# Patient Record
Sex: Male | Born: 1955 | Race: White | Hispanic: No | Smoking: Former smoker
Health system: Southern US, Community
[De-identification: ages and names within clinical notes are randomized; demographics above are authoritative.]

## PROBLEM LIST (undated history)

## (undated) DIAGNOSIS — F1010 Alcohol abuse, uncomplicated: Secondary | ICD-10-CM

## (undated) DIAGNOSIS — F410 Panic disorder [episodic paroxysmal anxiety] without agoraphobia: Secondary | ICD-10-CM

## (undated) DIAGNOSIS — K5790 Diverticulosis of intestine, part unspecified, without perforation or abscess without bleeding: Secondary | ICD-10-CM

## (undated) DIAGNOSIS — E785 Hyperlipidemia, unspecified: Secondary | ICD-10-CM

## (undated) DIAGNOSIS — G8929 Other chronic pain: Secondary | ICD-10-CM

## (undated) DIAGNOSIS — G4730 Sleep apnea, unspecified: Secondary | ICD-10-CM

## (undated) DIAGNOSIS — R0600 Dyspnea, unspecified: Secondary | ICD-10-CM

## (undated) DIAGNOSIS — K859 Acute pancreatitis without necrosis or infection, unspecified: Secondary | ICD-10-CM

## (undated) DIAGNOSIS — B1920 Unspecified viral hepatitis C without hepatic coma: Secondary | ICD-10-CM

## (undated) DIAGNOSIS — T7840XA Allergy, unspecified, initial encounter: Secondary | ICD-10-CM

## (undated) DIAGNOSIS — F319 Bipolar disorder, unspecified: Secondary | ICD-10-CM

## (undated) DIAGNOSIS — F32A Depression, unspecified: Secondary | ICD-10-CM

## (undated) DIAGNOSIS — F1991 Other psychoactive substance use, unspecified, in remission: Secondary | ICD-10-CM

## (undated) DIAGNOSIS — F329 Major depressive disorder, single episode, unspecified: Secondary | ICD-10-CM

## (undated) DIAGNOSIS — I10 Essential (primary) hypertension: Secondary | ICD-10-CM

## (undated) DIAGNOSIS — Z87898 Personal history of other specified conditions: Secondary | ICD-10-CM

## (undated) HISTORY — DX: Other chronic pain: G89.29

## (undated) HISTORY — DX: Alcohol abuse, uncomplicated: F10.10

## (undated) HISTORY — DX: Allergy, unspecified, initial encounter: T78.40XA

## (undated) HISTORY — DX: Unspecified viral hepatitis C without hepatic coma: B19.20

## (undated) HISTORY — DX: Acute pancreatitis without necrosis or infection, unspecified: K85.90

## (undated) HISTORY — PX: OTHER SURGICAL HISTORY: SHX169

## (undated) HISTORY — DX: Bipolar disorder, unspecified: F31.9

## (undated) HISTORY — DX: Diverticulosis of intestine, part unspecified, without perforation or abscess without bleeding: K57.90

## (undated) HISTORY — DX: Dyspnea, unspecified: R06.00

## (undated) HISTORY — DX: Major depressive disorder, single episode, unspecified: F32.9

## (undated) HISTORY — DX: Essential (primary) hypertension: I10

## (undated) HISTORY — DX: Depression, unspecified: F32.A

## (undated) HISTORY — PX: TONSILLECTOMY: SUR1361

## (undated) HISTORY — DX: Hyperlipidemia, unspecified: E78.5

---

## 1999-09-26 ENCOUNTER — Ambulatory Visit (HOSPITAL_COMMUNITY): Admission: RE | Admit: 1999-09-26 | Discharge: 1999-09-26 | Payer: Self-pay | Admitting: Internal Medicine

## 1999-09-26 ENCOUNTER — Encounter: Payer: Self-pay | Admitting: Internal Medicine

## 2000-09-28 ENCOUNTER — Emergency Department (HOSPITAL_COMMUNITY): Admission: EM | Admit: 2000-09-28 | Discharge: 2000-09-28 | Payer: Self-pay | Admitting: Emergency Medicine

## 2001-01-07 ENCOUNTER — Encounter: Admission: RE | Admit: 2001-01-07 | Discharge: 2001-02-13 | Payer: Self-pay | Admitting: Family Medicine

## 2001-03-27 ENCOUNTER — Encounter: Admission: RE | Admit: 2001-03-27 | Discharge: 2001-06-25 | Payer: Self-pay

## 2001-04-08 HISTORY — PX: CARDIAC CATHETERIZATION: SHX172

## 2001-07-16 ENCOUNTER — Encounter: Admission: RE | Admit: 2001-07-16 | Discharge: 2001-10-14 | Payer: Self-pay

## 2001-10-15 ENCOUNTER — Encounter: Admission: RE | Admit: 2001-10-15 | Discharge: 2002-01-05 | Payer: Self-pay

## 2002-01-11 ENCOUNTER — Encounter: Payer: Self-pay | Admitting: Cardiology

## 2002-01-11 ENCOUNTER — Inpatient Hospital Stay (HOSPITAL_COMMUNITY): Admission: EM | Admit: 2002-01-11 | Discharge: 2002-01-12 | Payer: Self-pay | Admitting: *Deleted

## 2002-01-12 ENCOUNTER — Ambulatory Visit (HOSPITAL_COMMUNITY): Admission: EM | Admit: 2002-01-12 | Discharge: 2002-01-12 | Payer: Self-pay | Admitting: Cardiology

## 2002-01-25 ENCOUNTER — Encounter: Admission: RE | Admit: 2002-01-25 | Discharge: 2002-04-25 | Payer: Self-pay

## 2002-02-01 ENCOUNTER — Ambulatory Visit (HOSPITAL_COMMUNITY): Admission: RE | Admit: 2002-02-01 | Discharge: 2002-02-01 | Payer: Self-pay

## 2002-05-05 ENCOUNTER — Encounter: Admission: RE | Admit: 2002-05-05 | Discharge: 2002-08-03 | Payer: Self-pay

## 2002-05-07 ENCOUNTER — Ambulatory Visit (HOSPITAL_COMMUNITY): Admission: RE | Admit: 2002-05-07 | Discharge: 2002-05-07 | Payer: Self-pay

## 2002-08-05 ENCOUNTER — Encounter
Admission: RE | Admit: 2002-08-05 | Discharge: 2002-11-03 | Payer: Self-pay | Admitting: Physical Medicine & Rehabilitation

## 2002-11-15 ENCOUNTER — Encounter
Admission: RE | Admit: 2002-11-15 | Discharge: 2003-02-13 | Payer: Self-pay | Admitting: Physical Medicine & Rehabilitation

## 2003-02-17 ENCOUNTER — Encounter
Admission: RE | Admit: 2003-02-17 | Discharge: 2003-05-18 | Payer: Self-pay | Admitting: Physical Medicine & Rehabilitation

## 2003-05-19 ENCOUNTER — Emergency Department (HOSPITAL_COMMUNITY): Admission: EM | Admit: 2003-05-19 | Discharge: 2003-05-19 | Payer: Self-pay | Admitting: Emergency Medicine

## 2003-07-21 ENCOUNTER — Encounter
Admission: RE | Admit: 2003-07-21 | Discharge: 2003-10-19 | Payer: Self-pay | Admitting: Physical Medicine & Rehabilitation

## 2003-09-30 ENCOUNTER — Encounter: Admission: RE | Admit: 2003-09-30 | Discharge: 2003-09-30 | Payer: Self-pay | Admitting: Family Medicine

## 2003-10-13 ENCOUNTER — Ambulatory Visit (HOSPITAL_BASED_OUTPATIENT_CLINIC_OR_DEPARTMENT_OTHER): Admission: RE | Admit: 2003-10-13 | Discharge: 2003-10-13 | Payer: Self-pay | Admitting: Family Medicine

## 2003-10-13 ENCOUNTER — Encounter: Payer: Self-pay | Admitting: Pulmonary Disease

## 2003-10-20 ENCOUNTER — Encounter
Admission: RE | Admit: 2003-10-20 | Discharge: 2004-01-05 | Payer: Self-pay | Admitting: Physical Medicine & Rehabilitation

## 2003-12-09 ENCOUNTER — Ambulatory Visit: Payer: Self-pay | Admitting: Family Medicine

## 2003-12-28 ENCOUNTER — Ambulatory Visit (HOSPITAL_BASED_OUTPATIENT_CLINIC_OR_DEPARTMENT_OTHER): Admission: RE | Admit: 2003-12-28 | Discharge: 2003-12-28 | Payer: Self-pay | Admitting: Sports Medicine

## 2003-12-28 ENCOUNTER — Encounter: Payer: Self-pay | Admitting: Internal Medicine

## 2004-01-05 ENCOUNTER — Encounter
Admission: RE | Admit: 2004-01-05 | Discharge: 2004-04-04 | Payer: Self-pay | Admitting: Physical Medicine & Rehabilitation

## 2004-01-09 ENCOUNTER — Ambulatory Visit: Payer: Self-pay | Admitting: Physical Medicine & Rehabilitation

## 2004-01-26 ENCOUNTER — Ambulatory Visit: Payer: Self-pay | Admitting: Family Medicine

## 2004-02-27 ENCOUNTER — Ambulatory Visit: Payer: Self-pay | Admitting: Sports Medicine

## 2004-03-09 ENCOUNTER — Ambulatory Visit: Payer: Self-pay | Admitting: Physical Medicine & Rehabilitation

## 2004-04-27 ENCOUNTER — Ambulatory Visit: Payer: Self-pay | Admitting: Sports Medicine

## 2004-05-17 ENCOUNTER — Encounter
Admission: RE | Admit: 2004-05-17 | Discharge: 2004-08-15 | Payer: Self-pay | Admitting: Physical Medicine & Rehabilitation

## 2004-05-17 ENCOUNTER — Ambulatory Visit: Payer: Self-pay | Admitting: Physical Medicine & Rehabilitation

## 2004-06-15 ENCOUNTER — Ambulatory Visit: Payer: Self-pay | Admitting: Family Medicine

## 2004-06-21 ENCOUNTER — Ambulatory Visit: Payer: Self-pay | Admitting: Family Medicine

## 2004-08-02 ENCOUNTER — Ambulatory Visit: Payer: Self-pay | Admitting: Physical Medicine & Rehabilitation

## 2004-08-29 ENCOUNTER — Encounter
Admission: RE | Admit: 2004-08-29 | Discharge: 2004-11-27 | Payer: Self-pay | Admitting: Physical Medicine & Rehabilitation

## 2004-11-23 ENCOUNTER — Ambulatory Visit: Payer: Self-pay | Admitting: Physical Medicine & Rehabilitation

## 2004-12-25 ENCOUNTER — Encounter
Admission: RE | Admit: 2004-12-25 | Discharge: 2005-03-25 | Payer: Self-pay | Admitting: Physical Medicine & Rehabilitation

## 2004-12-25 ENCOUNTER — Ambulatory Visit: Payer: Self-pay | Admitting: Physical Medicine & Rehabilitation

## 2004-12-28 ENCOUNTER — Ambulatory Visit: Payer: Self-pay | Admitting: Family Medicine

## 2005-01-04 ENCOUNTER — Ambulatory Visit: Payer: Self-pay | Admitting: Family Medicine

## 2005-01-08 ENCOUNTER — Emergency Department (HOSPITAL_COMMUNITY): Admission: EM | Admit: 2005-01-08 | Discharge: 2005-01-08 | Payer: Self-pay | Admitting: Emergency Medicine

## 2005-02-08 ENCOUNTER — Ambulatory Visit: Payer: Self-pay | Admitting: Family Medicine

## 2005-02-22 ENCOUNTER — Ambulatory Visit: Payer: Self-pay | Admitting: Physical Medicine & Rehabilitation

## 2005-03-29 ENCOUNTER — Ambulatory Visit: Payer: Self-pay | Admitting: Family Medicine

## 2005-04-19 ENCOUNTER — Encounter
Admission: RE | Admit: 2005-04-19 | Discharge: 2005-07-18 | Payer: Self-pay | Admitting: Physical Medicine & Rehabilitation

## 2005-04-19 ENCOUNTER — Ambulatory Visit: Payer: Self-pay | Admitting: Physical Medicine & Rehabilitation

## 2005-06-12 ENCOUNTER — Ambulatory Visit: Payer: Self-pay | Admitting: Physical Medicine & Rehabilitation

## 2005-08-08 ENCOUNTER — Ambulatory Visit: Payer: Self-pay | Admitting: Physical Medicine & Rehabilitation

## 2005-08-08 ENCOUNTER — Encounter
Admission: RE | Admit: 2005-08-08 | Discharge: 2005-11-06 | Payer: Self-pay | Admitting: Physical Medicine & Rehabilitation

## 2005-09-30 ENCOUNTER — Ambulatory Visit: Payer: Self-pay | Admitting: Physical Medicine & Rehabilitation

## 2005-12-03 ENCOUNTER — Ambulatory Visit: Payer: Self-pay | Admitting: Physical Medicine & Rehabilitation

## 2005-12-03 ENCOUNTER — Encounter
Admission: RE | Admit: 2005-12-03 | Discharge: 2006-03-03 | Payer: Self-pay | Admitting: Physical Medicine & Rehabilitation

## 2005-12-17 ENCOUNTER — Encounter
Admission: RE | Admit: 2005-12-17 | Discharge: 2005-12-17 | Payer: Self-pay | Admitting: Physical Medicine & Rehabilitation

## 2005-12-30 ENCOUNTER — Ambulatory Visit: Payer: Self-pay | Admitting: Family Medicine

## 2006-01-24 ENCOUNTER — Ambulatory Visit: Payer: Self-pay | Admitting: Physical Medicine & Rehabilitation

## 2006-02-13 ENCOUNTER — Ambulatory Visit: Payer: Self-pay | Admitting: Sports Medicine

## 2006-03-24 ENCOUNTER — Ambulatory Visit: Payer: Self-pay | Admitting: Physical Medicine & Rehabilitation

## 2006-03-24 ENCOUNTER — Encounter
Admission: RE | Admit: 2006-03-24 | Discharge: 2006-06-22 | Payer: Self-pay | Admitting: Physical Medicine & Rehabilitation

## 2006-04-08 DIAGNOSIS — T401X2A Poisoning by heroin, intentional self-harm, initial encounter: Secondary | ICD-10-CM | POA: Insufficient documentation

## 2006-04-18 ENCOUNTER — Encounter
Admission: RE | Admit: 2006-04-18 | Discharge: 2006-07-17 | Payer: Self-pay | Admitting: Physical Medicine & Rehabilitation

## 2006-05-19 ENCOUNTER — Encounter
Admission: RE | Admit: 2006-05-19 | Discharge: 2006-08-17 | Payer: Self-pay | Admitting: Physical Medicine and Rehabilitation

## 2006-05-19 ENCOUNTER — Ambulatory Visit: Payer: Self-pay | Admitting: Physical Medicine and Rehabilitation

## 2006-06-05 DIAGNOSIS — F411 Generalized anxiety disorder: Secondary | ICD-10-CM

## 2006-06-05 DIAGNOSIS — M545 Low back pain, unspecified: Secondary | ICD-10-CM | POA: Insufficient documentation

## 2006-06-05 DIAGNOSIS — G4730 Sleep apnea, unspecified: Secondary | ICD-10-CM | POA: Insufficient documentation

## 2006-06-05 DIAGNOSIS — F339 Major depressive disorder, recurrent, unspecified: Secondary | ICD-10-CM | POA: Insufficient documentation

## 2006-06-05 DIAGNOSIS — F1010 Alcohol abuse, uncomplicated: Secondary | ICD-10-CM | POA: Insufficient documentation

## 2006-06-05 DIAGNOSIS — F319 Bipolar disorder, unspecified: Secondary | ICD-10-CM | POA: Insufficient documentation

## 2006-06-05 DIAGNOSIS — F419 Anxiety disorder, unspecified: Secondary | ICD-10-CM | POA: Insufficient documentation

## 2006-06-05 DIAGNOSIS — B1710 Acute hepatitis C without hepatic coma: Secondary | ICD-10-CM | POA: Insufficient documentation

## 2006-08-26 ENCOUNTER — Encounter (INDEPENDENT_AMBULATORY_CARE_PROVIDER_SITE_OTHER): Payer: Self-pay | Admitting: *Deleted

## 2007-01-26 ENCOUNTER — Telehealth: Payer: Self-pay | Admitting: *Deleted

## 2007-02-02 ENCOUNTER — Ambulatory Visit: Payer: Self-pay | Admitting: Sports Medicine

## 2007-02-12 ENCOUNTER — Telehealth (INDEPENDENT_AMBULATORY_CARE_PROVIDER_SITE_OTHER): Payer: Self-pay | Admitting: Family Medicine

## 2007-04-08 ENCOUNTER — Encounter (INDEPENDENT_AMBULATORY_CARE_PROVIDER_SITE_OTHER): Payer: Self-pay | Admitting: *Deleted

## 2007-05-13 ENCOUNTER — Ambulatory Visit: Payer: Self-pay | Admitting: Family Medicine

## 2007-05-13 ENCOUNTER — Encounter (INDEPENDENT_AMBULATORY_CARE_PROVIDER_SITE_OTHER): Payer: Self-pay | Admitting: *Deleted

## 2007-05-13 DIAGNOSIS — J309 Allergic rhinitis, unspecified: Secondary | ICD-10-CM | POA: Insufficient documentation

## 2007-05-13 LAB — CONVERTED CEMR LAB: PSA: NORMAL ng/mL

## 2007-05-14 ENCOUNTER — Encounter (INDEPENDENT_AMBULATORY_CARE_PROVIDER_SITE_OTHER): Payer: Self-pay | Admitting: *Deleted

## 2007-05-14 LAB — CONVERTED CEMR LAB
ALT: 26 units/L (ref 0–53)
AST: 24 units/L (ref 0–37)
Albumin: 4.7 g/dL (ref 3.5–5.2)
Alkaline Phosphatase: 67 units/L (ref 39–117)
BUN: 20 mg/dL (ref 6–23)
CO2: 27 meq/L (ref 19–32)
Calcium: 9.3 mg/dL (ref 8.4–10.5)
Chloride: 104 meq/L (ref 96–112)
Cholesterol: 160 mg/dL (ref 0–200)
Creatinine, Ser: 0.99 mg/dL (ref 0.40–1.50)
Glucose, Bld: 77 mg/dL (ref 70–99)
HDL: 50 mg/dL (ref >39)
LDL Cholesterol: 89 mg/dL (ref 0–99)
PSA: 0.17 ng/mL (ref 0.10–4.00)
Potassium: 4.2 meq/L (ref 3.5–5.3)
Sodium: 141 meq/L (ref 135–145)
Total Bilirubin: 0.5 mg/dL (ref 0.3–1.2)
Total CHOL/HDL Ratio: 3.2
Total Protein: 7.5 g/dL (ref 6.0–8.3)
Triglycerides: 105 mg/dL (ref <150)
VLDL: 21 mg/dL (ref 0–40)

## 2007-06-01 ENCOUNTER — Telehealth: Payer: Self-pay | Admitting: *Deleted

## 2007-06-22 ENCOUNTER — Ambulatory Visit: Payer: Self-pay | Admitting: Gastroenterology

## 2007-07-06 ENCOUNTER — Ambulatory Visit: Payer: Self-pay | Admitting: Gastroenterology

## 2007-12-08 ENCOUNTER — Telehealth: Payer: Self-pay | Admitting: *Deleted

## 2008-04-05 ENCOUNTER — Ambulatory Visit: Payer: Self-pay | Admitting: Family Medicine

## 2008-04-05 DIAGNOSIS — R42 Dizziness and giddiness: Secondary | ICD-10-CM | POA: Insufficient documentation

## 2008-06-08 ENCOUNTER — Ambulatory Visit: Payer: Self-pay | Admitting: Family Medicine

## 2008-06-16 ENCOUNTER — Ambulatory Visit: Payer: Self-pay | Admitting: Family Medicine

## 2008-10-07 ENCOUNTER — Telehealth: Payer: Self-pay | Admitting: Family Medicine

## 2008-10-07 ENCOUNTER — Ambulatory Visit: Payer: Self-pay | Admitting: Family Medicine

## 2008-10-07 DIAGNOSIS — R51 Headache: Secondary | ICD-10-CM | POA: Insufficient documentation

## 2008-10-07 DIAGNOSIS — R519 Headache, unspecified: Secondary | ICD-10-CM | POA: Insufficient documentation

## 2008-10-17 ENCOUNTER — Telehealth: Payer: Self-pay | Admitting: Family Medicine

## 2008-10-19 ENCOUNTER — Ambulatory Visit: Payer: Self-pay | Admitting: Family Medicine

## 2008-10-19 ENCOUNTER — Encounter: Payer: Self-pay | Admitting: Family Medicine

## 2008-10-19 DIAGNOSIS — I10 Essential (primary) hypertension: Secondary | ICD-10-CM | POA: Insufficient documentation

## 2008-11-07 ENCOUNTER — Ambulatory Visit: Payer: Self-pay | Admitting: Family Medicine

## 2009-02-27 ENCOUNTER — Ambulatory Visit: Payer: Self-pay | Admitting: Family Medicine

## 2009-05-03 ENCOUNTER — Telehealth: Payer: Self-pay | Admitting: Family Medicine

## 2009-05-04 ENCOUNTER — Telehealth: Payer: Self-pay | Admitting: Psychology

## 2009-09-20 ENCOUNTER — Ambulatory Visit: Payer: Self-pay | Admitting: Pulmonary Disease

## 2009-09-20 DIAGNOSIS — Z9989 Dependence on other enabling machines and devices: Secondary | ICD-10-CM

## 2009-09-20 DIAGNOSIS — G4733 Obstructive sleep apnea (adult) (pediatric): Secondary | ICD-10-CM | POA: Insufficient documentation

## 2009-11-10 ENCOUNTER — Ambulatory Visit: Payer: Self-pay | Admitting: Family Medicine

## 2009-11-10 DIAGNOSIS — J029 Acute pharyngitis, unspecified: Secondary | ICD-10-CM | POA: Insufficient documentation

## 2009-11-18 ENCOUNTER — Encounter: Payer: Self-pay | Admitting: Pulmonary Disease

## 2009-11-21 ENCOUNTER — Telehealth: Payer: Self-pay | Admitting: Pulmonary Disease

## 2009-12-05 ENCOUNTER — Telehealth: Payer: Self-pay | Admitting: Pulmonary Disease

## 2009-12-07 ENCOUNTER — Ambulatory Visit: Payer: Self-pay | Admitting: Pulmonary Disease

## 2010-01-22 ENCOUNTER — Ambulatory Visit: Payer: Self-pay | Admitting: Family Medicine

## 2010-04-24 ENCOUNTER — Emergency Department (HOSPITAL_COMMUNITY)
Admission: EM | Admit: 2010-04-24 | Discharge: 2010-04-24 | Payer: Self-pay | Source: Home / Self Care | Admitting: Emergency Medicine

## 2010-04-25 LAB — POCT I-STAT, CHEM 8
BUN: 18 mg/dL (ref 6–23)
Calcium, Ion: 1.2 mmol/L (ref 1.12–1.32)
Chloride: 103 mEq/L (ref 96–112)
Creatinine, Ser: 1.2 mg/dL (ref 0.4–1.5)
Glucose, Bld: 99 mg/dL (ref 70–99)
HCT: 42 % (ref 39.0–52.0)
Hemoglobin: 14.3 g/dL (ref 13.0–17.0)
Potassium: 3.7 mEq/L (ref 3.5–5.1)
Sodium: 140 mEq/L (ref 135–145)
TCO2: 30 mmol/L (ref 0–100)

## 2010-04-25 LAB — POCT CARDIAC MARKERS
CKMB, poc: <1 ng/mL — ABNORMAL LOW (ref 1.0–8.0)
Myoglobin, poc: 55.8 ng/mL (ref 12–200)
Troponin i, poc: <0.05 ng/mL (ref 0.00–0.09)

## 2010-04-25 LAB — CBC
HCT: 40.5 % (ref 39.0–52.0)
Hemoglobin: 13.4 g/dL (ref 13.0–17.0)
MCH: 25.8 pg — ABNORMAL LOW (ref 26.0–34.0)
MCHC: 33.1 g/dL (ref 30.0–36.0)
MCV: 78 fL (ref 78.0–100.0)
Platelets: 188 10*3/uL (ref 150–400)
RBC: 5.19 MIL/uL (ref 4.22–5.81)
RDW: 14.2 % (ref 11.5–15.5)
WBC: 9.5 10*3/uL (ref 4.0–10.5)

## 2010-04-25 LAB — DIFFERENTIAL
Basophils Absolute: 0 10*3/uL (ref 0.0–0.1)
Basophils Relative: 0 % (ref 0–1)
Eosinophils Absolute: 0.1 10*3/uL (ref 0.0–0.7)
Eosinophils Relative: 1 % (ref 0–5)
Lymphocytes Relative: 28 % (ref 12–46)
Lymphs Abs: 2.6 10*3/uL (ref 0.7–4.0)
Monocytes Absolute: 0.7 10*3/uL (ref 0.1–1.0)
Monocytes Relative: 7 % (ref 3–12)
Neutro Abs: 6.1 10*3/uL (ref 1.7–7.7)
Neutrophils Relative %: 64 % (ref 43–77)

## 2010-04-25 LAB — GLUCOSE, CAPILLARY: Glucose-Capillary: 99 mg/dL (ref 70–99)

## 2010-05-08 NOTE — Progress Notes (Signed)
Summary: status of appt  Pt. contacted and given # of [facility].  States he thought the DR. wanted him to have a colonoscopy, no order found.  Routed to Dr. Seleta Rhymes ...................................................................Starleen Blue RN  February 12, 2007 3:14 PM  Phone Note Call from Patient Call back at Home Phone [phone]   Reason for Call: Talk to Nurse Summary of Call: pt is requesting the number to ENT and is also checking status of an appt for a colonoscopy Initial call taken by: ERIN LEVAN,  February 12, 2007 2:53 PM  Follow-up for Phone Call        we had discussed colonoscopy for when he turns 52. We were not actively scheduling this until he turns 52.  He was to f/u in february to have this arranged. Follow-up by: Benn Moulder MD,  February 13, 2007 1:41 PM         Appended Document: status of appt Phone call to pt- gave him the number to [facility] and advised him to schedule an appt for February to f/u about scheduling a colonoscopy.

## 2010-05-08 NOTE — Progress Notes (Signed)
Summary: unable to tolerate bipap  Phone Note From Other Clinic   Caller: Mardelle Matte with [facility]  Call For: Dr Shelle Iron Summary of Call: [phone] Mardelle Matte with [facility] called and stated that pt came back in today. Pt is stating that he just can't tolerate the bipap. [facility] has tried numerous mask fits and other desensition options. Pt is not wearing his Bipap and his 90 days will be up Oct. 2011.  Appt has been scheduled for 12/07/09 at 1:30.  Initial call taken by: Alfonso Ramus,  December 05, 2009 2:24 PM  Follow-up for Phone Call        noted.  will discuss then Follow-up by: Barbaraann Share MD,  December 05, 2009 3:28 PM

## 2010-05-08 NOTE — Assessment & Plan Note (Signed)
Summary: rov for osa   Copy to:  Self Referral Primary Provider/Referring Provider:  [name]  MD  CC:  Pt is here for a f/u appt.  Pt states he has difficulty wearing his bipap machine.  Pt states he has to get up multiple times during the night and dislikes having to take machine off and on throughout the night.  Pt also states he has trouble breathing with machine d/t "too much air."  .  History of Present Illness: The pt comes in today for f/u of his moderate osa.  He has been unable to tolerate even bipap, and would like to look at other options.  He just doesn't feel this is a viable therapy for him.  He is still having sleep issues at night, and is sleepy during the day.  Current Medications (verified): 1)  Lamictal 100 Mg Tabs (Lamotrigine) .... Take 2-3 Tabs By Mouth At Bedtime 2)  Klonopin 1 Mg Tabs (Clonazepam) .... Take 1 Tablet By Mouth Three Times A Day 3)  Oxycontin 60 Mg Xr12h-Tab (Oxycodone Hcl) .... Take 1 Tablet By Mouth Two Times A Day 4)  Oxycodone Hcl 15 Mg Tabs (Oxycodone Hcl) .... Take 1 Tab By Mouth Each Morning 5)  Sertraline Hcl 50 Mg Tabs (Sertraline Hcl) .... Take 1 Tablet By Mouth Once A Day 6)  Loratadine 10 Mg Tabs (Loratadine) .... One Tab By Mouth Qday 7)  Ranitidine Hcl 150 Mg Tabs (Ranitidine Hcl) .... One Tab By Mouth Qday  Allergies (verified): No Known Drug Allergies  Review of Systems       The patient complains of sore throat and nasal congestion/difficulty breathing through nose.  The patient denies shortness of breath with activity, shortness of breath at rest, productive cough, non-productive cough, coughing up blood, chest pain, irregular heartbeats, acid heartburn, indigestion, loss of appetite, weight change, abdominal pain, difficulty swallowing, tooth/dental problems, headaches, sneezing, itching, ear ache, anxiety, depression, hand/feet swelling, joint stiffness or pain, rash, change in color of mucus, and fever.    Vital  Signs:  Patient profile:   55 year old male Height:      67 inches Weight:      168.25 pounds BMI:     26.45 O2 Sat:      94 % on Room air Temp:     97.9 degrees F oral Pulse rate:   82 / minute BP sitting:   126 / 70  (left arm) Cuff size:   regular  Vitals Entered By: Arman Filter LPN (December 07, 2009 1:28 PM)  O2 Flow:  Room air CC: Pt is here for a f/u appt.  Pt states he has difficulty wearing his bipap machine.  Pt states he has to get up multiple times during the night and dislikes having to take machine off and on throughout the night.  Pt also states he has trouble breathing with machine d/t "too much air."   Comments Medications reviewed with patient Arman Filter LPN  December 07, 2009 1:28 PM    Physical Exam  General:  wd male in nad Nose:  no skin breakdown or pressure necrosis from his cpap mask Extremities:  no edema or cyanosis  Neurologic:  alert, mildly sleepy, moves all 4.   Impression & Recommendations:  Problem # 1:  OBSTRUCTIVE SLEEP APNEA (ICD-327.23)  the pt has moderate osa, and is unable to tolerate any kind of positive pressure device.  He does not feel this is a treatment modality for  him.  He has a very abnormal nasal airway, but his oral airway is not remarkable for tissue redundancy.  I have discussed with him the other treatment options for his osa, including surgery (for nasal airway and mandibular surgery), and dental appliance.  I have given him a brochure on dental appliances, and asked him to call if he wishes to be referred.  I also think a lot of his daytime sleepiness may be from his medications and poor sleep hygiene.  Medications Added to Medication List This Visit: 1)  Lamictal 100 Mg Tabs (Lamotrigine) .... Take 2-3 tabs by mouth at bedtime  Other Orders: Est. Patient Level III (42595)  Patient Instructions: 1)  will discontinue bipap 2)  please read the information on dental appliance, do research on internet, and let me  know if you would like to be referred for evaluation to [facility] or [place].

## 2010-05-08 NOTE — Assessment & Plan Note (Signed)
Summary: sore throat,tcb   Vital Signs:  Patient profile:   55 year old male Height:      67 inches Weight:      168 pounds BMI:     26.41 BSA:     1.88 Temp:     97.9 degrees F Pulse rate:   70 / minute BP sitting:   126 / 82  Vitals Entered By: Jone Baseman CMA (November 10, 2009 8:34 AM) CC: sore throat and hoarse x 1.5 months Is Patient Diabetic? No Pain Assessment Patient in pain? no        Primary Care Provider:  [name]  MD  CC:  sore throat and hoarse x 1.5 months.  History of Present Illness: 1) Sore throat: x 1.5 months. Feels like "burning, irritated", but not very painful. Worse in morning. Worse with drinking tomato juice. Also notes some intermittent hoarseness. Reports runny nose worse at night and in AM, itchy eyes, post nasal drip - has history of allergic rhinitis worse in spring/summer - worse now. History of tobacco use 2-3 packs per day dince age 50 up until 5 years ago. Reports soem baseline dyspnea on exertion.  Reports remote > 25 years ago history of EtOH abuse. Denies new medications, swallowing difficulty, fever, chills, weight loss, nausea, emesis, chest pain, sour taste in mouth, change in symptoms with position.   Medications as per prior meds   Habits & Providers  Alcohol-Tobacco-Diet     Tobacco Status: quit     Year Quit: 2006  Medications Prior to Update: 1)  Lamictal 100 Mg Tabs (Lamotrigine) .... Take 23 Tabs By Mouth At Bedtime 2)  Klonopin 1 Mg Tabs (Clonazepam) .... Take 1 Tablet By Mouth Three Times A Day 3)  Oxycontin 60 Mg Xr12h-Tab (Oxycodone Hcl) .... Take 1 Tablet By Mouth Two Times A Day 4)  Oxycodone Hcl 15 Mg Tabs (Oxycodone Hcl) .... Take 1 Tab By Mouth Each Morning 5)  Soma 350 Mg Tabs (Carisoprodol) .... Take 1 Tablet By Mouth Three Times A Day 6)  Sertraline Hcl 50 Mg Tabs (Sertraline Hcl) .... Take 1 Tablet By Mouth Once A Day  Allergies (verified): No Known Drug Allergies  Family History: Reviewed history  from 09/20/2009 and no changes required. Brothers -- no issues Maternal grandfather -- DM and breast cancer Father -- dec at 56 with MI (unsure if he was his father) Mother -- depression  Social History: Reviewed history from 09/20/2009 and no changes required. Divorced.  and lives alone No children..   Not actively dating.   On disability due to low back pain and bipolar disorder. does occ  Electric Hx polysubstancecurrently abstinent, goes to [employer].   Active at church.     Quit smoking in 2005.  started at age 63 .  1 to 2 ppd.   No ETOH or drug use since 1987.   Exercises regularly while walking his dog a couple of times a day and going to [facility].  Physical Exam  General:  alert, in no acute distress, sounds mildly hoarse today  Eyes:  no conjunctivitis  Nose:  no rhinorrhea  Mouth:  Oral mucosa and oropharynx without lesions or exudates. Mild post nasal drip  Neck:  no lymphadenopathy, thyromegaly or masses  Lungs:  CTAB w/o wheeze or crackles  Heart:  normal rate and regular rhythm, no murmurs   Abdomen:  soft, non-tender, normal bowel sounds, no masses, no guarding, and no rigidity.     Impression &  Recommendations:  Problem # 1:  RHINITIS (ICD-477.9) Assessment Deteriorated  Will start Loratadine for cost (was on Zyrtec in past for this) to see if might relieve his sore throat. Follow up one month.  His updated medication list for this problem includes:    Loratadine 10 Mg Tabs (Loratadine) ..... One tab by mouth qday  Orders: [facility]- Est  Level 4 (16109)  Problem # 2:  SORE THROAT (ICD-462) Assessment: New  Unlikely to be infectious given duration of symptoms. Possibly GERD vs rhinitis given reported history. Will treat with Loratadine as above, Ranitidine for possible GERD. Would also consider benign polyp vs neoplasm on differential given history of tobacco abuse. Consider ENT referral if not improving with above interventions in one month.   Orders: [facility]- Est  Level  4 (99214)  Complete Medication List: 1)  Lamictal 100 Mg Tabs (Lamotrigine) .... Take 23 tabs by mouth at bedtime 2)  Klonopin 1 Mg Tabs (Clonazepam) .... Take 1 tablet by mouth three times a day 3)  Oxycontin 60 Mg Xr12h-tab (Oxycodone hcl) .... Take 1 tablet by mouth two times a day 4)  Oxycodone Hcl 15 Mg Tabs (Oxycodone hcl) .... Take 1 tab by mouth each morning 5)  Soma 350 Mg Tabs (Carisoprodol) .... Take 1 tablet by mouth three times a day 6)  Sertraline Hcl 50 Mg Tabs (Sertraline hcl) .... Take 1 tablet by mouth once a day 7)  Loratadine 10 Mg Tabs (Loratadine) .... One tab by mouth qday 8)  Ranitidine Hcl 150 Mg Tabs (Ranitidine hcl) .... One tab by mouth qday  Patient Instructions: 1)  Follow up in one month for complete physical. 2)  Keep track of the times of day your sore throat is the worst and if any foods make it worse. 3)  Take ranitidine (for HEARTBURN) as directed. 4)  Take loratadine (for ALLERGIES) as directed. Prescriptions: RANITIDINE HCL 150 MG TABS (RANITIDINE HCL) one tab by mouth qday  #30 x 1   Entered and Authorized by:   [name]  MD   Signed by:   [name]  MD on 11/10/2009   Method used:   Print then Give to Patient   RxID:   [id number] LORATADINE 10 MG TABS (LORATADINE) one tab by mouth qday  #30 x 1   Entered and Authorized by:   [name]  MD   Signed by:   [name]  MD on 11/10/2009   Method used:   Print then Give to Patient   RxID:   [id number] RANITIDINE HCL 150 MG TABS (RANITIDINE HCL) one tab by mouth qday  #30 x 1   Entered and Authorized by:   [name]  MD   Signed by:   [name]  MD on 11/10/2009   Method used:   Print then Give to Patient   RxID:   [id number] LORATADINE 10 MG TABS (LORATADINE) one tab by mouth qday  #30 x 1   Entered and Authorized by:   [name]  MD   Signed by:   [name]  MD on 11/10/2009   Method used:   Print then Give to Patient   RxID:   [id number]

## 2010-05-08 NOTE — Letter (Signed)
Summary: [facility]  [facility]   Imported By: [place] [employer] 11/22/2009 10:26:08  _____________________________________________________________________  External Attachment:    Type:   Image     Comment:   External Document

## 2010-05-08 NOTE — Assessment & Plan Note (Signed)
Summary: dizzy spells,df   Vital Signs:  Patient profile:   55 year old male Weight:      167.7 pounds Pulse rate:   60 / minute BP sitting:   127 / 84  (right arm)  Vitals Entered By: Arlyss Repress CMA, (February 27, 2009 1:31 PM) CC: experienced spinning dizziness last night and some disorientation Is Patient Diabetic? No Pain Assessment Patient in pain? no        Primary Care Provider:  [name]  MD  CC:  experienced spinning dizziness last night and some disorientation.  History of Present Illness: Bout of vertigo last night, laid down and the room started to spin.  Has had similar events in the past.  Becomes dizzy frequently, not always sensation of spinning.  Knows that his anxiety gets in the way. Denies alcohol use.  Pain clinic MD has him on DIlaudid for break through pain, he does not like the way it makes him feel.  Tries to do extra work and stay busy, sometimes he thinks he is doing too much.  Habits & Providers  Alcohol-Tobacco-Diet     Tobacco Status: quit  Current Medications (verified): 1)  Lamictal 100 Mg Tabs (Lamotrigine) .... Take 2 By Mouth Qd 2)  Methadone Hcl 10 Mg Tabs (Methadone Hcl) .... Take 2  Tablets Once A  Day By Mouth 3)  Klonopin 1 Mg Tabs (Clonazepam) .Marland Kitchen.. 1 By Mouth Two Times A Day Prn 4)  Zyrtec Allergy 10 Mg  Tabs (Cetirizine Hcl) .Marland Kitchen.. 1 Once Daily Prn 5)  Hydrocodone-Acetaminophen 5-325 Mg Tabs (Hydrocodone-Acetaminophen) .... One Tab By Mouth Q 4 Hrs As Needed Pain 6)  Metoprolol Tartrate 25 Mg Tabs (Metoprolol Tartrate) .Marland Kitchen.. 1 Tab By Mouth Two Times A Day For Blood Pressure  Allergies (verified): No Known Drug Allergies  Social History: Smoking Status:  quit  Review of Systems General:  Denies chills, fever, sweats, and weakness. Neuro:  Complains of headaches, sensation of room spinning, and visual disturbances; denies difficulty with concentration, disturbances in coordination, falling down, and poor  balance.  Physical Exam  General:  alert, in no acute distress Eyes:  EOM full with nystagmus in extreme gaze, PERL, Negative Hallpike maneuver Ears:  R ear normal and L ear normal.   Lungs:  normal respiratory effort and normal breath sounds.   Heart:  normal rate and regular rhythm.     Impression & Recommendations:  Problem # 1:  VERTIGO (ICD-780.4)  Has had previous bouts, gave him a symptom diary to keep tract of thses sensations and bring in for primary MD His updated medication list for this problem includes:    Zyrtec Allergy 10 Mg Tabs (Cetirizine hcl) .Marland Kitchen... 1 once daily prn  Orders: [facility]- Est Level  2 (62130)  Problem # 2:  BACK PAIN, LOW (ICD-724.2) Followed by pain clinic His updated medication list for this problem includes:    Methadone Hcl 10 Mg Tabs (Methadone hcl) .Marland Kitchen... Take 2  tablets once a  day by mouth    Hydrocodone-acetaminophen 5-325 Mg Tabs (Hydrocodone-acetaminophen) ..... One tab by mouth q 4 hrs as needed pain  Problem # 3:  DEPRESSION, MAJOR, RECURRENT (ICD-296.30)  Followed by [facility]  Orders: [facility]- Est Level  2 (86578)  Complete Medication List: 1)  Lamictal 100 Mg Tabs (Lamotrigine) .... Take 2 by mouth qd 2)  Methadone Hcl 10 Mg Tabs (Methadone hcl) .... Take 2  tablets once a  day by mouth 3)  Klonopin 1  Mg Tabs (Clonazepam) .Marland Kitchen.. 1 by mouth two times a day prn 4)  Zyrtec Allergy 10 Mg Tabs (Cetirizine hcl) .Marland Kitchen.. 1 once daily prn 5)  Hydrocodone-acetaminophen 5-325 Mg Tabs (Hydrocodone-acetaminophen) .... One tab by mouth q 4 hrs as needed pain 6)  Metoprolol Tartrate 25 Mg Tabs (Metoprolol tartrate) .Marland Kitchen.. 1 tab by mouth two times a day for blood pressure  Other Orders: Influenza Vaccine NON MCR (14782)  Patient Instructions: 1)  Please schedule a follow-up appointment in 1 month.    Immunizations Administered:  Influenza Vaccine # 1:    Vaccine Type: Fluvax Non-MCR    Site: left deltoid    Mfr: GlaxoSmithKline    Dose:  0.5 ml    Route: IM    Given by: Tessie Fass CMA    Exp. Date: 10/05/2009    Lot #: [id number]    VIS given: 10/30/06 version given February 27, 2009.  Flu Vaccine Consent Questions:    Do you have a history of severe allergic reactions to this vaccine? no    Any prior history of allergic reactions to egg and/or gelatin? no    Do you have a sensitivity to the preservative Thimersol? no    Do you have a past history of Guillan-Barre Syndrome? no    Do you currently have an acute febrile illness? no    Have you ever had a severe reaction to latex? no    Vaccine information given and explained to patient? yes

## 2010-05-08 NOTE — Progress Notes (Signed)
Summary: Initial [facility]   Phone Note Call from Patient Call back at Home Phone [phone]   Caller: Patient Summary of Call: would like to start counseling w/ Dr Pascal Lux - (see previous note)  I told him that Dr Pascal Lux would call him after she speaks w/ his doctor. Initial call taken by: De Nurse,  May 04, 2009 11:17 AM  Follow-up for Phone Call        Pt checking on status of appt with councelor. Follow-up by: Clydell Hakim,  May 10, 2009 10:23 AM  Additional Follow-up for Phone Call Additional follow up Details #1::        Just now seeing this phone note.  Called and left a VM but not sure if it was the right number (it was the number listed above).  Will await follow-up. Additional Follow-up by: Spero Geralds PsyD,  June 01, 2009 2:24 PM    Additional Follow-up for Phone Call Additional follow up Details #2::    [name] called back.  He sees Dr. Mila Homer at [facility] for medication.  He saw a therapist there a long time but she no longer takes his insurance.  Does not care to see the others that are there.  Is worried about health.  When anxious, he thinks he can't breathe.  Starts thinking about death and dying.  Would like to talk.  Scheduled for first new appt which was March 17th at 2:00. Follow-up by: Spero Geralds PsyD,  June 01, 2009 2:33 PM

## 2010-05-08 NOTE — Assessment & Plan Note (Signed)
Summary: self referral for osa   Copy to:  Self Referral Primary Provider/Referring Provider:  [name]  MD   History of Present Illness: the pt is a 54y/o male who comes in today as a self referral for evaluation of osa.  He was diagnosed in 2005 with moderate osa, with AHI 27/hr and desat to 71%.  He underwent cpap titration, with his optimal pressure 15cm.  He was started on cpap, but was unable to wear due to his awakening with the feeling he was smothering.  His main issue was difficulty with exhalation.  He was seen by ENT, and they did not want to do surgery.  Currently, he goes to bed btw 7-57mn, and arises at 6-8am.  He has a hard time initiating sleep, and has frequent awakenings.  He has been noted to have snoring and an abnormal breathing pattern during sleep.  He is not rested in the am's upon arising, and notes definite sleep pressure with any period of inactivity.  He also has depression, and thinks this is an issue as well.  He denies any sleepiness with driving.  Of note, his weight is similar to that at the time of his sleep study.  Current Medications (verified): 1)  Lamictal 100 Mg Tabs (Lamotrigine) .... Take 23 Tabs By Mouth At Bedtime 2)  Klonopin 1 Mg Tabs (Clonazepam) .... Take 1 Tablet By Mouth Three Times A Day 3)  Oxycontin 60 Mg Xr12h-Tab (Oxycodone Hcl) .... Take 1 Tablet By Mouth Two Times A Day 4)  Oxycodone Hcl 15 Mg Tabs (Oxycodone Hcl) .... Take 1 Tab By Mouth Each Morning 5)  Soma 350 Mg Tabs (Carisoprodol) .... Take 1 Tablet By Mouth Three Times A Day 6)  Sertraline Hcl 50 Mg Tabs (Sertraline Hcl) .... Take 1 Tablet By Mouth Once A Day  Allergies (verified): No Known Drug Allergies  Past History:  Past Medical History: `Cleared Hep C infxn`, fx vertebra at age 9 -- now w/ chronic pain, mult nose fractures s/p repair sleep study--mod OSA with mild hypoxia - 12/09/2003 Current Problems:  HYPERTENSION, BENIGN (ICD-401.1) HEADACHE (ICD-784.0) VERTIGO  (ICD-780.4) RHINITIS (ICD-477.9) HEPATITIS C (ICD-070.51) DEPRESSION, MAJOR, RECURRENT (ICD-296.30) BIPOLAR DISORDER (ICD-296.7) BACK PAIN, LOW (ICD-724.2) APNEA, SLEEP (ICD-780.57) ANXIETY (ICD-300.00) ALCOHOL ABUSE, UNSPECIFIED (ICD-305.00)    Past Surgical History: tonsillectomy as a child sinus surgery 1990  Family History: Reviewed history from 06/16/2008 and no changes required. Brothers -- no issues Maternal grandfather -- DM and breast cancer Father -- dec at 15 with MI (unsure if he was his father) Mother -- depression  Social History: Reviewed history from 06/08/2008 and no changes required. Divorced.  and lives alone No children..   Not actively dating.   On disability due to low back pain and bipolar disorder. does occ  Electric Hx polysubstancecurrently abstinent, goes to [employer].   Active at church.     Quit smoking in 2005.  started at age 69 .  1 to 2 ppd.   No ETOH or drug use since 1987.   Exercises regularly while walking his dog a couple of times a day and going to [facility].  Review of Systems       The patient complains of shortness of breath with activity, shortness of breath at rest, chest pain, tooth/dental problems, headaches, nasal congestion/difficulty breathing through nose, anxiety, depression, and joint stiffness or pain.  The patient denies productive cough, non-productive cough, coughing up blood, irregular heartbeats, acid heartburn, indigestion, loss of appetite, weight change,  abdominal pain, difficulty swallowing, sore throat, sneezing, itching, ear ache, hand/feet swelling, rash, change in color of mucus, and fever.    Vital Signs:  Patient profile:   55 year old male Height:      67 inches Weight:      168 pounds BMI:     26.41 O2 Sat:      98 % on Room air Temp:     97.6 degrees F oral Pulse rate:   60 / minute BP sitting:   136 / 82  (left arm) Cuff size:   regular  Vitals Entered By: Arman Filter LPN (September 20, 2009 2:36 PM)  O2  Flow:  Room air Comments Medications reviewed with patient Arman Filter LPN  September 20, 2009 2:36 PM    Physical Exam  General:  wd male in nad Eyes:  PERRLA and EOMI.   Nose:  deviated septum to far left with near obstruction Mouth:  normal palate and uvula, no enlarged tonsils, no tissues redundancy  Neck:  no jvd, tmg, LN Lungs:  clear to auscultation Heart:  rrr, no mrg Abdomen:  soft and nontender, bs+ Extremities:  no edema or cyanosis pulses intact distally Neurologic:  alert and oriented, moves all 4.   Impression & Recommendations:  Problem # 1:  OBSTRUCTIVE SLEEP APNEA (ICD-327.23) the pt has a history of moderate osa, and currently is not being treated due to cpap intolerance.  His main complaint is that of "smothering" on exhalation, and this may be solved by changing him to bilevel, where his expiratory pressure can be adjusted independently.  Will start him out on a low pressure setting, then try to get him to his known optimal pressure.  He also has many other reasons for sleeping poorly, including his sleep hygiene and his underlying psychiatric illness.  Will see how much we can improve by treating his sleep apnea, and I have encouraged him to work on his sleep habits as well.  Medications Added to Medication List This Visit: 1)  Lamictal 100 Mg Tabs (Lamotrigine) .... Take 23 tabs by mouth at bedtime 2)  Klonopin 1 Mg Tabs (Clonazepam) .... Take 1 tablet by mouth three times a day 3)  Oxycontin 60 Mg Xr12h-tab (Oxycodone hcl) .... Take 1 tablet by mouth two times a day 4)  Oxycodone Hcl 15 Mg Tabs (Oxycodone hcl) .... Take 1 tab by mouth each morning 5)  Soma 350 Mg Tabs (Carisoprodol) .... Take 1 tablet by mouth three times a day 6)  Sertraline Hcl 50 Mg Tabs (Sertraline hcl) .... Take 1 tablet by mouth once a day  Other Orders: Consultation Level IV (21308) DME Referral (DME)  Patient Instructions: 1)  will change your cpap to bipap for comfort...please call  if having issues with tolerance. 2)  will see you back in 4 weeks

## 2010-05-08 NOTE — Medication Information (Signed)
Summary: Nocturnal Polysomnogram/[place]  Nocturnal Polysomnogram/[place]   Imported By: Sherian Rein 10/04/2009 15:21:16  _____________________________________________________________________  External Attachment:    Type:   Image     Comment:   External Document

## 2010-05-08 NOTE — Progress Notes (Signed)
Summary: triage   Phone Note Call from Patient Call back at Home Phone [phone]   Caller: Patient Summary of Call: having a hard time breathing, not sure if it's anxiety Initial call taken by: De Nurse,  May 03, 2009 1:32 PM  Follow-up for Phone Call        states his insurance will not pay for a therapist.  taking 1/2 pill  of Klonipin a day. breathing worse recently and he associates it with anxiety.   injured back a few weeks ago & has been in bed alot. had been going to [facility] but thinks he did too much.  tried to get him to see md today at [facility]. He did not want to come here either. I explained someone had to listen to chest to make sure lungs & heart were ok. Can discuss anxiety with md & may make app with Dr. Pascal Lux if the md recommends. told him medicaid does pay for therapy states he will "just go to ED" advised less of a wait at [facility] or here. He denies hx clots or heart problems. quit smoking 4-5 years ago.  appt made for tomorrow pm with Dr. Earlene Plater as he wanted appt as late as possible Follow-up by: Golden Circle RN,  May 03, 2009 1:39 PM

## 2010-05-08 NOTE — Progress Notes (Signed)
Summary: nos appt  Phone Note Call from Patient   Caller: [email or web address]  Call For: clance Summary of Call: Rsc nos from 8/15 to 9/16 @ 3:30p. Initial call taken by: Darletta Moll,  November 21, 2009 10:18 AM

## 2010-05-08 NOTE — Consult Note (Signed)
Summary: [facility]  [facility]   Imported By: Denny Peon LEVAN 04/08/2007 13:22:04  _____________________________________________________________________  External Attachment:    Type:   Image     Comment:   External Document

## 2010-05-08 NOTE — Assessment & Plan Note (Signed)
Summary: HA x 1 month/[place]   Vital Signs:  Patient profile:   55 year old male Height:      67 inches Weight:      168 pounds BMI:     26.41 Temp:     97.8 degrees F Pulse rate:   64 / minute BP sitting:   154 / 89  Vitals Entered By: Golden Circle RN (October 07, 2008 1:35 PM) Pain Assessment Patient in pain? yes     Location: back & tooth Intensity: 2/10   Primary Care Provider:  [name]  MD   History of Present Illness: 1) Headache: Vague complaints of headache x past two months. Dull pain, not really bothersome, not sure how often it occurs or if daily. No vision change, emesis, photophobia/phonophobia, weakness or any othr symptoms other than mild toothache at L lower molar (lost filling this week), and mild sore throat about two weeks ago. Denies other URI symptoms, sinus pressure, hearing change, trauma. Headache started on R, moved over to left. With further questioning states that he feels as though it is all related to his bipolar disorder and depression.   2) Depression/anxiety: Had some problems with his insurance about two months ago and was unable to get his medications until two weeks ago. Denies SI/HI. Still feels like staying in bed all day and not being around people. Symptoms of anhedonia and increased sleep. Sees Dr. Mila Homer at [facility]. As before, denies alcohol or drug abuse at this time, though past h/o alcohol abuse. Feels as though headache is related to his depression.   Habits & Providers  Alcohol-Tobacco-Diet     Year Quit: 2005  Problems Prior to Update: 1)  Vertigo  (ICD-780.4) 2)  Rhinitis  (ICD-477.9) 3)  Vaccine Against Influenza  (ICD-V04.81) 4)  Hepatitis C  (ICD-070.51) 5)  Depression, Major, Recurrent  (ICD-296.30) 6)  Bipolar Disorder  (ICD-296.7) 7)  Back Pain, Low  (ICD-724.2) 8)  Apnea, Sleep  (ICD-780.57) 9)  Anxiety  (ICD-300.00) 10)  Alcohol Abuse, Unspecified  (ICD-305.00)  Allergies (verified): No Known Drug  Allergies  Review of Systems       as per HPI   Physical Exam  General:  Appears quite depresssed, somewhat anxious Head:  non tender to palpation, no sinus tenderness, no signs trauma,  Eyes:  PERRL, EOMI  Neck:  no neck pain, full ROM  Neurologic:  CN II-XII intact, alert & oriented X3.   Psych:  Oriented X3, memory intact for recent and remote, depressed affect, somewhat anxious, and poor eye contact.     Impression & Recommendations:  Problem # 1:  DEPRESSION, MAJOR, RECURRENT (ICD-296.30) Assessment Deteriorated  Denies suicidal or homicidal ideation. Very depressed mood today. Does not want to try new medication regimen; wants to follow with his psychiatrist. Advised  to use exercise in conjunction with medication as a means of coping, and to let psychiatrist know about current difficulties. Does not wish to follow up any sooner than the one year interval previously discussed.   Orders: [facility]- Est  Level 4 ([address]109)  Problem # 2:  HEADACHE (ICD-784.0) Assessment: New  Unsure as to etiology of this, in part due to patient inability to describe symptoms. Likely related to depressive symptoms given onset after ran out of medication for bipolar d/o. Advised to record symptoms with headache diary if symptoms continue. Advised to follow with psychiatrist.   Orders: [facility]- Est  Level 4 ([address]454)  Complete Medication List: 1)  Lamictal  100 Mg Tabs (Lamotrigine) .... Take 2 by mouth qd 2)  Methadone Hcl 10 Mg Tabs (Methadone hcl) .... Take 2  tablets once a  day by mouth 3)  Klonopin 1 Mg Tabs (Clonazepam) .Marland Kitchen.. 1 by mouth two times a day prn 4)  Zyrtec Allergy 10 Mg Tabs (Cetirizine hcl) .Marland Kitchen.. 1 once daily prn 5)  Hydrocodone-acetaminophen 5-325 Mg Tabs (Hydrocodone-acetaminophen) .... One tab by mouth q 4 hrs as needed pain  Patient Instructions: 1)  It was great to see you today! 2)  Please make appointment with psychiatrist.  3)  Continue all medications as before.  4)  Make an  appointment with dentist. 5)  Please record your symptoms opf headache.  6)  I will otherwise see you at your follow up in March 2011.  7)  Continue exercising and trying to get rest to help with your depression and anxiety.  8)

## 2010-05-08 NOTE — Assessment & Plan Note (Signed)
Summary: ENT referral , hx broken nose, difficulty breathing /ls    Vital Signs:  Patient Profile:   55 Years Old Male Height:     66 inches (167.64 cm) Weight:      170 pounds (77.27 kg) BMI:     27.54 Temp:     97.7 degrees F (36.50 degrees C) BP sitting:   129 / 91  (right arm)  Pt. in pain?   no  Vitals Entered By: Tomasa Rand (February 02, 2007 2:55 PM)                  Chief Complaint:  ENT referral.  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  Over the past several years he has difficulty breathing through his nose. He has sleep apnea but has been unable to tolerate cpap due to this (he has failed multiple models).  He has been increasingly tired through the day.  He denies an allergic rhinitis or nose bleeds. No recent nasal trauma.  he has seen ent before for this in the past but they refused to operate b/c he was a smoker. He quit smoking 3 yrs ago and would like to be consider further surgery.     Social History:    Single.  Not actively dating.  On disability.  Electric work in past.  Hx polysubstancecurrently abstinent, goes to [employer].  Active at church.  Goes to [facility] to exercise.  Quit smoking in 2005.   Risk Factors:  Tobacco use:  quit    Year quit:  2005    Physical Exam  General:     Well-developed,well-nourished,in no acute distress; alert,appropriate and cooperative throughout examination Head:     Normocephalic and atraumatic without obvious abnormalities. No apparent alopecia or balding. Nose:     External nasal examination shows no deformity or inflammation. Nasal mucosa are pink and moist without lesions or exudates. Mouth:     Oral mucosa and oropharynx without lesions or exudates.  Teeth in good repair.    Impression & Recommendations:  Problem # 1:  APNEA, SLEEP (ICD-780.57) patient with known sleep apnea, unable to tolerate cpap. WIll make referral to ENT for  further work up of his nasal passage. Orders: [facility]- Est Level  3 (65784) ENT Referral (ENT)   Problem # 2:  Preventive Health Care (ICD-V70.0) flu shot was given.  Other Orders: Flu Vaccine 80yrs + (69629) Admin 1st Vaccine (52841)     ]  Preventive Care Screening  Last Flu Shot:    Date:  02/02/2007    Results:  given    Influenza Vaccine    Vaccine Type: Fluvax 3+    Site: left deltoid    Mfr: [employer]    Dose: 0.5 ml    Route: IM    Given by: Tomasa Rand    Exp. Date: 10/06/2007    Lot #: [id number]    VIS given: 10/05/04 version given February 02, 2007.  Flu Vaccine Consent Questions    Do you have a history of severe allergic reactions to this vaccine? no    Any prior history of allergic reactions to egg and/or gelatin? no    Do you have a sensitivity to the preservative Thimersol? no    Do you have a past history of Guillan-Barre Syndrome? no    Do you currently have an acute febrile illness? no    Have you ever had a  severe reaction to latex? no    Vaccine information given and explained to patient? yes

## 2010-05-08 NOTE — Assessment & Plan Note (Signed)
Summary: cpe,df   Vital Signs:  Patient profile:   55 year old male Height:      67 inches Weight:      167.50 pounds BMI:     26.33 BSA:     1.88 Temp:     97.9 degrees F Pulse rate:   74 / minute BP sitting:   125 / 80  Vitals Entered By: Jone Baseman CMA (January 22, 2010 8:44 AM) CC: CPE Is Patient Diabetic? No Pain Assessment Patient in pain? no        Primary Care Provider:  [name]  MD  CC:  CPE.  History of Present Illness: 1) Sore throat: x past 6 months. Feels like "burning, irritated", but not very painful. Worse in morning. Worse with drinking tomato juice. Also notes some intermittent hoarseness. Reports runny nose worse at night and in AM, itchy eyes, post nasal drip - has history of allergic rhinitis worse in spring/summer - worse now.  History of tobacco use 2-3 packs per day since age 77 up until 5 years ago.   Reports remote > 25 years ago history of EtOH abuse. Trial of loratadine for allergic rhinitis symptoms -  reports that symptoms have not improved. Trial of ranitidine for possible GERD - reports symptoms have not improved. History of sleep apnea as well - unable to tolerate mask.   Denies new medications, swallowing difficulty, fever, chills, weight loss, nausea, emesis, chest pain, sour taste in mouth, change in symptoms with position, dyspnea   2) HTN: At goal today. Goal 140/90. No side effects from medications. Attempts to monitor salt intake.   Medications as per prior meds   Habits & Providers  Alcohol-Tobacco-Diet     Alcohol drinks/day: 0     Alcohol Counseling: not indicated; patient does not drink     Tobacco Status: quit     Year Quit: 2006  Medications Prior to Update: 1)  Lamictal 100 Mg Tabs (Lamotrigine) .... Take 2-3 Tabs By Mouth At Bedtime 2)  Klonopin 1 Mg Tabs (Clonazepam) .... Take 1 Tablet By Mouth Three Times A Day 3)  Oxycontin 60 Mg Xr12h-Tab (Oxycodone Hcl) .... Take 1 Tablet By Mouth Two Times A Day 4)   Oxycodone Hcl 15 Mg Tabs (Oxycodone Hcl) .... Take 1 Tab By Mouth Each Morning 5)  Sertraline Hcl 50 Mg Tabs (Sertraline Hcl) .... Take 1 Tablet By Mouth Once A Day 6)  Loratadine 10 Mg Tabs (Loratadine) .... One Tab By Mouth Qday 7)  Ranitidine Hcl 150 Mg Tabs (Ranitidine Hcl) .... One Tab By Mouth Qday  Allergies (verified): No Known Drug Allergies  Physical Exam  General:  alert, in no acute distress, sounds mildly hoarse today  Head:  non tender to palpation, no sinus tenderness, no signs trauma,  Eyes:  no conjunctivitis  Ears:  R ear normal and L ear normal.   Nose:  no rhinorrhea, + congestion  Mouth:  Oral mucosa and oropharynx without lesions or exudates. Post nasal drip  Neck:  no lymphadenopathy, thyromegaly or masses  Lungs:  CTAB w/o wheeze or crackles  Heart:  normal rate and regular rhythm, no murmurs   Abdomen:  soft, non-tender, normal bowel sounds, no masses, no guarding, and no rigidity.   Msk:  5/5 strength in all extremities, unwilling to flex or extend at lumbar spine secondary to pain  Pulses:  2+ radials bilaterally  Extremities:  no edema Neurologic:  CN II-XII intact, alert & oriented X3.  Skin:  2 x 2 mm seborrheic keratosis on hyperpigmented base under right eyelid    Impression & Recommendations:  Problem # 1:  SORE THROAT (ICD-462) Assessment Unchanged Again unlikely to be infectious given duration of symptoms. Possibly GERD vs allergic rhinitis given reported history. Continue  treat with Loratadine as above, will add flonase as well. Ranitidine for possible GERD (or OTC PPI if able to afford). Sleep apnea (untreated) may be contributing as well. Advised regarding using humidifier at night. Would also consider benign polyp vs neoplasm on differential given history of tobacco abuse. STRONGLY consider ENT referral if not improving with above interventions in one month.   Problem # 2:  HYPERTENSION, BENIGN (ICD-401.1)  Well controlled today with  metoprolol. Stress and anxiety may be contributing. Will follow His updated medication list for this problem includes:    Metoprolol Tartrate 25 Mg Tabs (Metoprolol tartrate) .Marland Kitchen... 1 tab by mouth two times a day for blood pressure  BP today: 125/80 Prior BP: 126/70 (12/07/2009)  Labs Reviewed: K+: 4.2 (05/13/2007) Creat: : 0.99 (05/13/2007)   Chol: 160 (05/13/2007)   HDL: 50 (05/13/2007)   LDL: 89 (05/13/2007)   TG: 105 (05/13/2007)  Problem # 3:  RHINITIS (ICD-477.9) Assessment: Unchanged  His updated medication list for this problem includes:    Loratadine 10 Mg Tabs (Loratadine) ..... One tab by mouth qday    Flonase 50 Mcg/act Susp (Fluticasone propionate) .Marland Kitchen... 2 sprays each nostril daily. disp one bottle.  Will start Loratadine for cost (was on Zyrtec in past for this) to see if might relieve his sore throat. Follow up one month. Add flonase as well.   Complete Medication List: 1)  Lamictal 100 Mg Tabs (Lamotrigine) .... Take 2-3 tabs by mouth at bedtime 2)  Klonopin 1 Mg Tabs (Clonazepam) .... Take 1 tablet by mouth three times a day 3)  Oxycontin 60 Mg Xr12h-tab (Oxycodone hcl) .... Take 1 tablet by mouth two times a day 4)  Oxycodone Hcl 15 Mg Tabs (Oxycodone hcl) .... Take 1 tab by mouth each morning 5)  Sertraline Hcl 50 Mg Tabs (Sertraline hcl) .... Take 1 tablet by mouth once a day 6)  Loratadine 10 Mg Tabs (Loratadine) .... One tab by mouth qday 7)  Ranitidine Hcl 150 Mg Tabs (Ranitidine hcl) .... One tab by mouth qday 8)  Flonase 50 Mcg/act Susp (Fluticasone propionate) .... 2 sprays each nostril daily. disp one bottle.  Other Orders: [facility] - Est  40-64 yrs ([address]109)  Patient Instructions: 1)  Use flonase spray as directed.  2)  Take loratadine as directed to help with congestion.  3)  Follow up in 6 weeks.  Prescriptions: LORATADINE 10 MG TABS (LORATADINE) one tab by mouth qday  #30 x 1   Entered and Authorized by:   [name]  MD   Signed by:   [name]  MD  on 01/22/2010   Method used:   Electronically to        [facility]  [address] [phone]* (retail)       [address]       [place], [place]  [address]       Ph: [phone] or [phone]       Fax: [phone]   RxID:   [id number] FLONASE 50 MCG/ACT SUSP (FLUTICASONE PROPIONATE) 2 sprays each nostril daily. Disp one bottle.  #1 x 1   Entered and Authorized by:   [name]  MD   Signed by:   Rande Lawman  Wallene Huh  MD on 01/22/2010   Method used:   Electronically to        [facility]  [address] [phone]* (retail)       [address]       [place], [place]  [address]       Ph: [phone] or [phone]       Fax: [phone]   RxID:   [id number]    Orders Added: 1)  [facility] - Est  40-64 yrs [phone]

## 2010-05-08 NOTE — Letter (Signed)
Summary: Results Follow-up Letter  [facility]  [address]   [place], [place] [address]   Phone: [phone]  Fax: [phone]    05/14/2007  [address] [place], [place]  [address]  Dear [name],   The following are the results of your recent test(s):  ______________ Cholesterol LDL(Bad cholesterol): 89         Your goal is less than: 130        HDL (Good cholesterol):50        Your goal is more than:40 _________________________________________________________ Other Tests:  Your prostate test and blood sugar were both normal. Your lab work was perfect. It was nice to see you this week.  Remember to get your colonoscopy!  I will see you back in 1 year or as needed. _________________________________________________________  Sincerely,  Benn Moulder MD [facility]

## 2010-07-27 ENCOUNTER — Ambulatory Visit (INDEPENDENT_AMBULATORY_CARE_PROVIDER_SITE_OTHER): Payer: Medicaid Other | Admitting: Family Medicine

## 2010-07-27 ENCOUNTER — Encounter: Payer: Self-pay | Admitting: Family Medicine

## 2010-07-27 ENCOUNTER — Other Ambulatory Visit: Payer: Self-pay | Admitting: Anesthesiology

## 2010-07-27 VITALS — BP 124/82 | HR 60 | Temp 98.2°F | Ht 67.0 in | Wt 166.4 lb

## 2010-07-27 DIAGNOSIS — M545 Low back pain, unspecified: Secondary | ICD-10-CM

## 2010-07-27 DIAGNOSIS — J029 Acute pharyngitis, unspecified: Secondary | ICD-10-CM

## 2010-07-27 DIAGNOSIS — L989 Disorder of the skin and subcutaneous tissue, unspecified: Secondary | ICD-10-CM

## 2010-07-27 MED ORDER — OMEPRAZOLE 20 MG PO CPDR: 20.0000 mg | DELAYED_RELEASE_CAPSULE | Freq: Every day | ORAL | Status: DC

## 2010-07-27 NOTE — Patient Instructions (Signed)
I think the lesion under your eye is a benign lesion called a seborrheic keratosis.  I would like for you to make an appointment with our procedure clinic to have this biopsied/removed just to make sure this isn't something else.  For your throat pain, this may due to acid reflux.  I am sending a medication over to your pharmacy for you to pick up.  It may take a few weeks for the inflammation to go down if this is being caused by acid.

## 2010-07-28 ENCOUNTER — Ambulatory Visit
Admission: RE | Admit: 2010-07-28 | Discharge: 2010-07-28 | Disposition: A | Discharge status: Home / Self Care | Payer: Medicaid Other | Source: Ambulatory Visit | Attending: Anesthesiology | Admitting: Anesthesiology

## 2010-07-28 DIAGNOSIS — M545 Low back pain, unspecified: Secondary | ICD-10-CM

## 2010-07-30 DIAGNOSIS — L989 Disorder of the skin and subcutaneous tissue, unspecified: Secondary | ICD-10-CM | POA: Insufficient documentation

## 2010-07-30 NOTE — Progress Notes (Signed)
  Subjective:    Patient ID: [name], male    DOB: 05/11/1955, 55 y.o.   MRN: [id number]  HPI Patient here for 1. Lesion under R eye:  Has been present for a long time (8-10 years), pain management doctor wanted this to be evaluated because she felt like it was getting larger.  Area is non-painful, non-pruritic, denies drainage or bleeding.  He is unsure if it is getting larger.  Denies any other areas that he is concerned about. 2.  Persistent sore throat:  Has had persistent sore throat for a couple of years.  Does have hoarseness in the am associated with this.  Deniies seasonal allergies, illness, difficulty swallowing, cough, hematemesis, or hemoptysis.  Pain dull ache mostly, but sharp at times.  Does have occasional symptoms of acid reflux and spends a lot of time lying flat.    Review of Systems    See HPI Objective:   Physical Exam  Constitutional: He appears well-developed and well-nourished. No distress.  HENT:  Head: Normocephalic and atraumatic.  Nose: Nose normal.  Mouth/Throat: Oropharynx is clear and moist. No oropharyngeal exudate.  Eyes: Conjunctivae are normal. Pupils are equal, round, and reactive to light.  Neck: Neck supple.  Cardiovascular: Normal rate, regular rhythm and normal heart sounds.   Pulmonary/Chest: Effort normal and breath sounds normal.  Lymphadenopathy:    He has no cervical adenopathy.  Skin: Skin is warm and dry. Lesion noted.       Pigmented lesion under right eye, part of this a as stuck on appearance while upper edges have irregular border.  Pigment is equal throughout.           Assessment & Plan:

## 2010-07-30 NOTE — Assessment & Plan Note (Signed)
Small lesion under R eyes.  Looks to be a seborrheic keratosis.  However upper irregular borders somewhat worrisome, will refer to procedure clinic or pcp for removal/biopsy of lesion.

## 2010-07-30 NOTE — Assessment & Plan Note (Addendum)
Do not believe etiology is infectious oropharynx completely normal and has been going on for two years.  Likely caused by reflux especially at night and when lying flat.  Will change zantac to omeprazole to see if this helps.  Explained may take a few weeks to notice full effects.

## 2010-07-31 ENCOUNTER — Ambulatory Visit: Payer: Medicaid Other | Admitting: Family Medicine

## 2010-08-01 ENCOUNTER — Ambulatory Visit: Payer: Medicaid Other | Admitting: Family Medicine

## 2010-08-07 ENCOUNTER — Encounter: Payer: Medicaid Other | Admitting: Family Medicine

## 2010-08-07 ENCOUNTER — Encounter: Payer: Self-pay | Admitting: Family Medicine

## 2010-08-07 NOTE — Progress Notes (Signed)
  Patient presented for liquid nitrogen freeze of facial lesion (seborrheic keratosis) - clinic was out of liquid nitrogen. No charge.

## 2010-08-24 NOTE — Assessment & Plan Note (Signed)
A 55 year old male with lumbar facet arthropathy. Has tried injections  before.  He states that he has been quite tired recently, had an episode  of shortness of breath, seen in Urgent Care.  Chest x-ray was fine.  EKG  was okay, but he was noted to be anemic. However, I do not have his  hemoglobin.   He has no obvious source of bleeding.  We have not changed his medicines  here at [facility].  He states that he has been trialed on some  other medications including some type of stimulants, initially Ritalin  but then something else through his psychiatrist, Dr. Mila Homer.  He has been  on Remeron, Lamictal, Wellbutrin and Klonopin.   Pain medicines prescribed by this clinic, methadone 20 mg p.o. t.i.d.,  last prescribed February 24, 2006.   His blood pressure is 147/86, pulse 80, respiratory rate is 18, O2 sat  97% room air.  GENERAL:  No acute distress.  Mood and affect appropriate.  He does  appear tired.  He will ambulate without evidence of toe drag or knee  instability.  He has good back range of motion, no tenderness to  palpation lumbar spine.   IMPRESSION:  Lumbar facet arthropathy, on chronic methadone.  His  fatigue can be methadone related, especially if his psychiatric  medications may be boosting methadone levels.  We will reduce his  methadone to 10 mg b.i.d., given that his pain complaints are fairly  well managed at this point.  He will follow up with his primary care for  his anemia as well as psychiatry and I will send a note to Dr. Mila Homer, he  could not tell me who his primary care doctor was since he goes to  Urgent Care Clinic.      [name], M.D.  Electronically Signed     AEK/MedQ  D:  03/25/2006 13:16:16  T:  03/25/2006 15:53:24  Job #:  [id number]   cc:   Ezzard Flax  Fax: [phone]

## 2010-08-24 NOTE — Assessment & Plan Note (Signed)
REASON FOR VISIT:  Chronic facet arthropathy with chronic low back pain.   HISTORY OF PRESENT ILLNESS:  A 55 year old male with lumbar facet syndrome,  chronic back pain, chronic depression prior history of substance abuse.  Functional status remains stable.  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  He is going to the [facility] to swim about 2-3 times per  week.  His pain level has been around a 3 level.  Pain with activity is  about a 7/10.   He has a history of depression, sees a psychiatrist and uses Lamictal as  well as Remeron, and was recently, i.e. January 18, 2005, started on  Wellbutrin.   PHYSICAL EXAMINATION:  GENERAL APPEARANCE:  No acute distress.  Mood and  affect appropriate.  VITAL SIGNS:  Blood pressure 115/76, pulse 71, respirations 16, O2  saturation 98% on room air.  NEUROLOGICAL:  Gait is normal.  He is able to bend forward, about 75% range  extension, 75% range lateral bending, 75% range normal strength bilateral  lower extremities.  Normal deep tendon reflexes bilateral lower extremity.  Normal range of motion bilateral lower extremities.   IMPRESSION:  1.  Lumbar facet syndrome.  No evidence of a radicular component.  Stable      and functional on current medical regimen which is essentially methadone      10 mg p.o. t.i.d.  2.  Depression.  On remeron 30 mg q.h.s., Lamictal one p.o. q.a.m.,      Wellbutrin 150 mg p.o. b.i.d., Valium 5 mg p.o. b.i.d.  These are likely      also helping with his pain complaints.   FOLLOWUP:  I will see him back in three months.  He will have nurse follow  up for monitoring given history on a q.monthly basis.      [name], M.D.  Electronically Signed     AEK/MedQ  D:  02/22/2005 09:47:02  T:  02/22/2005 11:40:13  Job #:  [id number]

## 2010-08-24 NOTE — Consult Note (Signed)
[facility]  Patient:    ARRAN, FESSEL Visit Number: [id number] MRN: [id number]          Service Type: PMG Location: [facility] Attending Physician:  Sondra Come Dictated by:   Sondra Come, D.O. Proc. Date: 07/22/01 Admit Date:  07/16/2001                            Consultation Report  REASON FOR VISIT:  Mr. Bollig returns to clinic today as scheduled for re-evaluation.  He was last seen on June 24, 2001.  In the interim Mr. Blaze has been doing considerably better in terms of his low back pain.  His pain is currently 2/10 on a subjective scale and is typically controlled with Ultracet four to six per day as well as Baclofen as needed.  He continues taking Bextra but he does not feel that it is helping significantly, and we discussed discontinuing that for a period of time to determine whether or not it was contributing to his pain relief.  He also states that he quit smoking approximately three weeks ago and continues water walking at the [facility].  I reviewed health and history form and 14-point review of systems.  No lower extremity radicular complaints.  PHYSICAL EXAMINATION:  GENERAL:  A healthy male in no acute distress.  VITAL SIGNS:  Blood pressure is 148/87, pulse 75, respirations 16, O2 saturation 98% on room air.  MUSCULOSKELETAL/NEUROLOGIC:  Manual muscle testing is 5/5 bilateral lower extremities.  Sensory exam is intact to light touch bilateral lower extremities.  Muscle stretch reflexes are 2+/4 bilateral lower extremities. Straight leg raise is negative bilaterally.  Minimal tenderness to palpation, bilateral lumbar paraspinals.  Range of motion is functional.  IMPRESSION: 1. Lumbar facet syndrome with improved low back pain. 2. Improved functional indices.  PLAN: 1. Continue Ultracet one to two p.o. t.i.d. as needed, #100 with two refills. 2. Continue Baclofen 10 mg one-half to one p.o. t.i.d. as needed, #60 with    two  refills. 3. Discontinue Bextra for now.  Will consider reinitiating it if patient feels    like it was beneficial. 4. Patient to return to clinic in three months for re-evaluation.  Patient was educated on the above findings and recommendations and understands.  No barriers to communication.  Patient encouraged to continue exercise program. Dictated by:   Sondra Come, D.O. Attending Physician:  Sondra Come DD:  07/22/01 TD:  07/22/01 Job: [id number]

## 2010-08-24 NOTE — Consult Note (Signed)
[name], WOOLLARD                           ACCOUNT NO.:  [id number]   MEDICAL RECORD NO.:  [id number]                   PATIENT TYPE:  OUT   LOCATION:  XRAY                                 FACILITY:  [facility]   PHYSICIAN:  Sondra Come, D.O.                 DATE OF BIRTH:  02/02/1956   DATE OF CONSULTATION:  02/22/2002  DATE OF DISCHARGE:  02/01/2002                                   CONSULTATION   REASON FOR CONSULTATION:  The patient returns to the clinic today as  scheduled for left lumbar facet injections diagnostically and  therapeutically at L3-4 and L4-5.  The patient was last seen on 02/18/02.  He denies any new complaints in regards to his symptoms.  He continues to  have low back pain primarily radiating occasionally into his right lower  extremity, but infrequently.  His pain today is a 3 to 4/10 on a subjective  scale.  Function and quality of life indices have declined.  He continues  taking hydrocodone up to t.i.d. as needed for pain.  I reviewed health and  history form and 14 point review of systems.  No new neurologic complaints.   PHYSICAL EXAMINATION:  VITAL SIGNS:  Blood pressure is 142/79, pulse 92,  respirations 18, O2 saturation 97% on room air.   IMPRESSION:  1. Lumbar facet arthropathy with chronic low back pain.  Arthropathy seems     to be most prominent at L3-4 and L4-5 on the left.  2. History of substance abuse.  3. History of depression.   PLAN:  1. Left L3-4 and L4-5 facet joint injections.  2. Continue Mobic 7.5 mg q.d.  3. Continue Norco 10 mg/325 mg 1/2 to one p.o. b.i.d. to t.i.d. p.r.n.  The     patient should have enough medication to last through 02/28/02.  4. The patient is to return to clinic in two weeks for re-evaluation.  Would     consider repeat injections as predicated upon the patient's response and     symptoms.   PROCEDURE:  Left L3-4 and L4-5 facet joint injections.   DESCRIPTION OF PROCEDURE:  The procedure was described  to the patient in  detail, including risks, benefits, limitations, and alternatives.  The  patient wishes to proceed.  Informed consent was obtained.  The patient was  brought back to the fluoroscopy suite and placed on the table in the prone  position.  Skin was prepped and draped in usual sterile fashion.  Skin and  subcutaneous tissues were anesthetized with 3 cc of preservative-free 1%  lidocaine.  Under direct fluoroscopic guidance, a 22 gauge 3.5 inch spinal  needle was advanced into the left L3-4 facet joint under multiple  fluoroscopic views.  Needle tip placement was confirmed after negative  aspiration with the injection of 0.25 cc of Omnipaque 180 revealing left L3-  4 facet joint arthrogram.  This was then followed by the injection of 0.5 cc  of Kenalog 40 mg/cc plus 1 cc of preservative-free 1% lidocaine.  The needle  was then advanced into the inferior recess of the left L4-5 facet joint  under multiple fluoroscopic views.  Needle tip placement was confirmed after  bony contact was made with negative aspiration.  This was followed by the  injection of 0.25 cc of Omnipaque 180 revealing L4-5 facet joint arthrogram.  This was then followed by the injection of 0.5 of Kenalog 40 mg/cc plus 1 cc  of preservative-free 1% lidocaine.  No vascular or epidural spread was noted  during the procedure.  The patient tolerated the procedure well.  Discharge  instructions were given.  The patient was monitored and released in stable  condition.   The patient was educated on the above findings and recommendations and  understands.  There were no barriers to communication.                                               Sondra Come, D.O.    JJW/MEDQ  D:  02/22/2002  T:  02/22/2002  Job:  [id number]

## 2010-08-24 NOTE — Procedures (Signed)
NAMEHAKIEM, [name]               ACCOUNT NO.:  [id number]   MEDICAL RECORD NO.:  [id number]          PATIENT TYPE:  OUT   LOCATION:  SLEEP CENTER                 FACILITY:  [facility]   PHYSICIAN:  Clinton D. Maple Hudson, M.D. DATE OF BIRTH:  06/03/1955   DATE OF ADMISSION:  10/13/2003  DATE OF DISCHARGE:  10/13/2003                              NOCTURNAL POLYSOMNOGRAM   REFERRING PHYSICIAN:  Dr. Douglass Rivers   INDICATION FOR STUDY/HISTORY:  NPSG requested for evaluation of hypersomnia  with obstructive sleep apnea.  The patient complains of loud snoring, waking  fatigue, nasal congestion, daytime sleepiness, nonrestorative sleep.  Home  medications include Valium, methadone and Remeron.  He took Remeron before  bed for this study.  Neck size 16 inches.  Epworth sleepiness score 9/24.  Weight 170 pounds.  BMI 26.6.   SLEEP ARCHITECTURE:  Sleep of 332 minutes were recorded with sleep  efficiency of 91%.  Seven percent was stage 1, 61% was stage 2, 11% stage 3  and 4, 21% REM.  Latency to sleep onset was 12 minutes.  Latency to REM was  98 minutes.  He complained of feeling stiff and sore on waking in the  morning same as usual.   RESPIRATORY DATA:  A respiratory disturbance index of 26.9 was recorded,  consistent with moderately severe obstructive sleep apnea/hypopnea syndrome.  There were 116 obstructive hypopneas, 25 obstructive apneas, 8 central  apneas.  He slept only supine.   OXYGEN DATA:  Snoring was moderately loud.  Mean oxygen saturation through  the study was 92-94% with a nadir of 79% and overall impression of mild  transiently moderate oxygen desaturation with respiratory events.   CARDIAC DATA:  There was a regular sinus rhythm with heart rate as slow as  37-51 beats per minute with more typically 73-77 beats per minute and no  significant ectopics recorded.   MOVEMENTS/PARASOMNIA:  Occasional leg jerks of doubtful clinical  significance.   IMPRESSION/RECOMMENDATION:   Moderate obstructive sleep apnea/hypopnea with  hypersomnolence and mild oxygen desaturation.  Suggest return for CPAP  titration or review of all tentative therapies and consultation is  appropriate.                                   ______________________________                                Rennis Chris. Maple Hudson, M.D.                                Diplomate, American Board of Sleep Medicine    CDY/MEDQ  D:  10/23/2003 13:37:11  T:  10/23/2003 22:01:30  Job:  [id number]

## 2010-08-24 NOTE — Consult Note (Signed)
[name], [name]                           ACCOUNT NO.:  [id number]   MEDICAL RECORD NO.:  [id number]                   PATIENT TYPE:  OUT   LOCATION:  XRAY                                 FACILITY:  [facility]   PHYSICIAN:  Zachary George, DO                      DATE OF BIRTH:  1955/04/12   DATE OF CONSULTATION:  04/06/2002  DATE OF DISCHARGE:  02/01/2002                                   CONSULTATION   REASON FOR CONSULTATION:  The patient returns to clinic today for re-  evaluation.  He was last seen on 03/08/02.  At that time, I started him on  Duragesic 25 mcg for persistent low back pain unrelieved with more  conservative measures as well as interventional procedures.  The patient  states that the Duragesic does not really seem to be helping him too much.  He continues on hydrocodone 10 mg b.i.d.  He states that his low back pain  seems to be getting worse.  He has not been going to the [facility] for aquatic  exercises secondary to wearing the patch.  He states they probably don't  want somebody wearing a patch in the pool.  His pain today is a 3/10 on a  subjective scale, although he has been very sedentary lately.  His pain  increases significantly with any activity.  I reviewed health and history  form and 14 point review of systems.  He continues following with  psychiatry.   PHYSICAL EXAMINATION:  GENERAL:  A healthy male in no acute distress.  VITAL SIGNS:  Blood pressure 139/84, pulse 93, respirations 18, O2  saturation 95% on room air.  Mood is somewhat depressed, affect is flat  today.  NEUROLOGIC:  No new neurologic findings on physical examination, including  motor, sensory, and reflexes.  There is tenderness to palpation bilateral  lumbar paraspinals.   IMPRESSION:  1. Chronic low back pain  2. Lumbar facet syndrome.  3. Depression.  4. History of substance abuse.   PLAN:  1. I discussed further treatment options with the patient.  At this point,     will transition  him from Duragesic to Avinza 30 mg one p.o. q.d. #30     without refills.  We will continue to monitor the patient's usage     pattern, as he does have a history of substance abuse.  He has been     compliant with his medication regimen thus far.  2. Continue Norco 10 mg/325 mg 1/2 to one p.o. b.i.d. p.r.n. breakthrough     pain #60 without refills.  3. The patient is to continue with aquatic therapy.  4. The patient is to return to clinic in one month for re-evaluation.   The patient was educated on the above findings and recommendations and  understands.  There were no barriers to communication.  Zachary George, DO    JW/MEDQ  D:  04/06/2002  T:  04/06/2002  Job:  [id number]

## 2010-08-24 NOTE — Consult Note (Signed)
[facility]  Patient:    [name], [name] Visit Number: [id number] MRN: [id number]          Service Type: PMG Location: [facility] Attending Physician:  Sondra Come Dictated by:   Sondra Come, D.O. Admit Date:  03/27/2001                            Consultation Report  HISTORY OF PRESENT ILLNESS:  The patient returns to the clinic today for trial of right lumbar facet blocks for low back pain. The patients pain level today is a 4/10 on a subjective scale. His function and quality of life indices remain declined. His sleep remains fair to poor. He continues on Bextra 20 mg daily without any noticeable relief. His pain today is located mainly centrally in his lumbar spine. No new neurologic complaints. I reviewed health and history form and 14-point review of systems.  PHYSICAL EXAMINATION:  GENERAL:  A healthy male in no acute distress.  VITAL SIGNS:  Blood pressure 129/68, pulse 74, respirations 20, O2 saturation 97% on room air.  EXTREMITIES:  No new neurologic findings in the lower extremities including motor sensory and reflexes. Palpatory examination of the back reveals tenderness to palpation bilateral paraspinal muscles.  IMPRESSION: 1. Chronic low back pain. I suspect a significant component of facet    arthropathy contributing to the patients symptoms. 2. History of substance abuse. 3. History of depression.  PLAN:  Right lumbar facet blocks. The procedure was described to patient in detail including risks, benefits, limitations, and alternatives. Informed consent was obtained. The patient was brought back to the fluoroscopy suite and placed on the table in prone position. Skin was prepped and draped in the usual sterile fashion. Skin and subcutaneous tissue was anesthetized with 2 cc of 1% lidocaine at 4 independent needle access points. Under direct fluoroscopic guidance, a 22-gauge 3-1/2-inch spinal needle was advanced to the junction  of the right sacral ala and the right S1 superior articular process. Bone was contacted and negative aspiration performed. No CSF, heme, or paresthesia were noted. This was then injected with 0.25 cc of Kenalog 40 mg per cc plus 0.5 cc of 0.25% bupivacaine. Needle was advanced then using independent needle access points to the junction of the right L3, L4, and L5 superior articular processes with their corresponding transverse processes. Once bone was contacted at each level, negative aspiration was performed. No CSF, heme, or paresthesias were noted. Each level was then injected with 0.25 cc of Kenalog 40 mg per cc plus 0.5 cc of 0.25% bupivacaine. No complications. The patient tolerated the procedure well. Post-injection VAS is 1-2/10 on a subjective scale.  DISCHARGE INSTRUCTIONS: 1. The patient was instructed to monitor his symptoms over the next several    hours and days and report this back at his next visit. 2. The patient is to continue Bextra for now. 3. The patient to return to clinic in 1 week for reevaluation. If the    patient has a positive response to the facet blocks, will consider    radiofrequency ablation of the medial branches.  The patient was educated on the above findings and recommendations and understands. There were no barriers to communication. The patient was evaluated in a chaperoned environment. The patient denies pregnancy. Dictated by:   Sondra Come, D.O. Attending Physician:  Sondra Come DD:  04/16/01 TD:  04/16/01 Job: [id number]

## 2010-08-24 NOTE — Assessment & Plan Note (Signed)
CHIEF COMPLAINT:  Mr. Cordoba returns today after I last saw him on February 22, 2003.  He tried increasing his MS Contin to 30 mg q.8h.  He has gone  back to the gym a bit more and even has tried one part-time job project but  overall still not at the level he was at last summer in terms of  functionality.  He was on methadone 10 t.i.d. last summer.   He has not followed up with psychiatry, remains depressed with some panic  attacks but no suicidal ideation.   MEDICATIONS:  1. Remeron 60 p.o. daily.  2. Valium 10 p.o. daily.  3. Senokot with Dr. Emeline Darling at [facility].  4. MS Contin 30 mg q.8h. - he is almost out.   REVIEW OF SYSTEMS:  As noted above.  No bowel or bladder problems.   PHYSICAL EXAMINATION:  VITAL SIGNS:  Blood pressure 150/98, pulse 112, O2  saturation 96% room air.  GENERAL:  He just came back from the gym; he was at the [facility].  MUSCULOSKELETAL:  Back:  No tenderness to palpation; 50% range extension;  75% range forward flexion, lateral bending, and rotation.  Normal range of  motion bilateral lower extremities.  No pain on palpation bilateral lower  extremities.  Normal strength bilateral lower extremities.   Pain score current:  4/10, average 4, goal from 2-7.  Pain interference  scores show activity 8, mood 8, walking ability 8, normal gait, relationship  to other people 9, sleep 4, enjoyment of life 8.   IMPRESSION:  1. Lumbar facet syndrome with chronic low back pain.  He has been able to     increase his function somewhat with MS Contin at a q.8h. level, however     still not at the level he was at with activity as when he was on the     methadone.  His pain interference scores were in the 4-6/10 range at that     time.  2. I will see him back in approximately one month.  3. Have referred him to psychiatry.  This is a condition in terms of further     prescription of the methadone.  If he does not comply I will taper him     off the methadone as I think this  is an important part of his overall     pain problem and needs to be addressed, not only with medication but also     with some further counseling.  4. Encouraged continued physical activity as well as working part-time as     much as possible.  5. Reviewed controlled substance agreement.      [name], M.D.   AEK/MedQ  D:  03/08/2003 14:13:29  T:  03/08/2003 14:42:45  Job #:  [id number]

## 2010-08-24 NOTE — Assessment & Plan Note (Signed)
Mr. Geiler returns today complaining of stiffness in his back.  He does do  exercises, both swimming as well as some stretching on land.  He has been  working out at [employer].  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  He  also complains of right knee pain, but is actually more anterior shin pain  and posterior calf pain.  He states that his knee feels stiff.  Health and  history form reviewed on 14-point review of systems.   Interval medical history otherwise negative.  Blood pressure is 144/72,  pulse 70, respirations 16, O2 saturation 99% on room air.   He is in no acute distress.  His mood and affect are flat, but otherwise  appropriate.  He has no swelling in his knee, no pain around the patella.  He has full range of motion of the knee. Mild crepitus at the anterior  aspect.   His back range of motion is good.  He has pain mainly with extension.  His  last MRI spine x-ray showed disk space narrowing at L5-S1, end plate N5-6,  spondylosis left L3, right L3.  MRI of lumbar spine in 2003 demonstrated  facet degenerative changes L3-4, L4-5.  He has had lumbar radiofrequency  procedure in the past which are not particularly helpful.   PLAN:  1.  Will schedule for lumbar facet versus transforaminal epidural  S1.  2.  Continue methadone 10 mg t.i.d.  3.  Consider physical therapy after injection.  4.  May need repeat MRI.  5.  X-ray right knee, although I do not feel that there is any significant      pathology at least on examination.      [name], M.D.  Electronically Signed     AEK/MedQ  D:  10/28/2005 16:39:39  T:  10/28/2005 21:44:49  Job #:  [id number]

## 2010-08-24 NOTE — Consult Note (Signed)
[facility]  Patient:    [name], [name] Visit Number: [id number] MRN: [id number]          Service Type: PMG Location: [facility] Attending Physician:  Sondra Come Dictated by:   Sondra Come, D.O. Proc. Date: 04/27/01 Admit Date:  03/27/2001                            Consultation Report  Mr. Reser returns to clinic today, as scheduled, for right lumbar medial Langbehn neurotomy following positive response to medial Hankin blocks.  The patient was last seen in clinic on April 23, 2001.  He continues to complain of low back pain, rating it a 4/10 on a subjective scale.  I reviewed health and history form and 14 point review of systems.  No new neurologic complaints.  PHYSICAL EXAMINATION:  GENERAL:  Healthy male in no acute distress.  VITAL SIGNS:  Blood pressure 144/93, pulse 78, respirations 12, O2 saturations 96% on room air.  NEUROLOGIC:  No new neurologic findings in the lower extremities including motor, sensory, and reflexes.  There is tenderness to palpitation bilateral lumbar paraspinal muscles.  IMPRESSION: 1. Facet syndrome of the lumbar spine with significant positive response to    lumbar facet blocks. 2. History of substance abuse. 3. History of depression.  PLAN: 1. Lumbar medial Barra neurotomy on the right.  The procedure was described    to patient in detail, including risks, benefits, and limitations.  The    patient wished to proceed.  Informed consent was obtained.  The patient was    brought back to the fluoroscopy suite and placed on the table in prone    position.  The skin was prepped and draped in the usual sterile fashion.    Skin and subcutaneous tissue was anesthetized with 2 cc of 1% lidocaine at    four independent needle access points.  Under direct fluoroscopic guidance,    a 20 gauge curved Racz-Finch radiofrequency probe was advanced to the    junction of the right sacral ala and superior articular process.   Needle    tip confirmation was obtained after negative aspiration with the injection    of 0.25 cc of Omnipaque 180.  Also, needle tip was observed in multiple    fluoroscopic planes. The same was done for the additional three levels, as    the probe was advanced using independent needle access points to the    junction of the right L3, L4, and L5 superior articular processes with    their corresponding transverse processes.  Again, needle tip confirmation    was obtained after negative aspiration with the injection of 0.25 cc of    Omnipaque 180.  Each position was viewed in multiple fluoroscopic planes.    There was no vascular uptake noted.  There was no epidural spread noted.    There was no ______  noted.  Each site was then tested appropriately for    motor and sensory response.  Each site was then injected with 0.25 cc of    Kenalog 40 mg/cc plus 0.5 cc of 1% lidocaine.  Each site was then lesioned    at 70 degrees for 70 seconds.  No complications noted. The patient    tolerated the procedure well.  The patient was monitored for one hour    postprocedure secondary to IV conscious sedation with 2 mg of Versed.  The  patient recovered completely and was discharged in stable condition.    Discharge instructions given. 2. The patient was given samples of Ultracet 1-2 p.o. p.r.n. #20. 3. He was also given samples of Bextra 20 mg 1 q.d. #15. 4. The patient is to return to clinic in two weeks for reevaluation.  The patient was educated on the above findings and recommendations and understands.  There were no barriers to communication.Dictated by:   Sondra Come, D.O. Attending Physician:  Sondra Come DD:  04/27/01 TD:  04/28/01 Job: [id number]

## 2010-08-24 NOTE — Assessment & Plan Note (Signed)
INTERVAL HISTORY:  Since February 22, 2005, indicates some increased neck  pain which he states started about a week ago.  He had just restarted  working out at [employer] plus he worked and his neck has flared up since that  time.  No arm pain or numbness.  No weaknesses in upper or lower  extremities.   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  He is  now back swimming in the [facility].  His pain level is about a 3/10 and with  activity, about 7/10.   His depression he states has been stable and has been using Lamictal,  Remeron, Wellbutrin, but has been switched from valium to Klonopin by his  psychiatrist.   REVIEW OF SYSTEMS:  Please see 14-point review on health and history form.   SOCIAL HISTORY:  Lives alone, divorced.   PHYSICAL EXAMINATION:  GENERAL APPEARANCE:  No acute distress.  Mood and  affect appropriate.  VITAL SIGNS:  Blood pressure 139/72, pulse 85, respiratory rate 16, O2  saturation 98% on room air.  BACK:  No tenderness to palpation.  NECK:  Decreased in all planes with forward flexion, extension, lateral  rotation and bending about 75% of normal range.  NEUROLOGIC:  Motor strength is full bilateral upper and lower extremities.  Deep tendon reflexes are normal bilateral upper extremities.   IMPRESSION:  1.  Lumbar facet syndrome.  2.  Cervicalgia.  May have some underlying cervical facet problems.   Went ahead and continued current medications which include methadone 10 mg  t.i.d. and medications utilized for the treatment of depression as outlined  above.   I will see him back in three months.  He will continue to follow up with  nurse follow-up clinic on a monthly basis.      [name], M.D.  Electronically Signed     AEK/MedQ  D:  05/16/2005 17:15:57  T:  05/17/2005 08:43:10  Job #:  [id number]

## 2010-08-24 NOTE — Procedures (Signed)
[name], [name]                 ACCOUNT NO.:  [id number]   MEDICAL RECORD NO.:  [id number]          PATIENT TYPE:  OUT   LOCATION:  SLEEP CENTER                 FACILITY:  [facility]   PHYSICIAN:  Clinton D. Maple Hudson, M.D. DATE OF BIRTH:  03-30-1956   DATE OF STUDY:  12/28/2003  DATE OF DISCHARGE:  12/28/2003                              NOCTURNAL POLYSOMNOGRAM   REFERRING PHYSICIAN:  Dr. Enid Baas   INDICATION FOR STUDY:  Hypersomnia with sleep apnea.  Diagnostic NPSG October 13, 2003 recorded an RDI of 26.9/hour.  He returns now for CPAP titration.  Epworth sleepiness scale 9/24.  Neck size 16 inches.  BMI 26.  Weight 170  pounds.   MEDICATIONS:  1.  Valium.  2.  Methadone.  3.  Remeron.   SLEEP ARCHITECTURE:  Total sleep time 305 minutes with sleep efficiency 80%.  Stage I was 14%, stage II 77%, stages III and IV were absent.  REM was 9% of  total sleep time.  Sleep latency 28 minutes.  REM latency 96 minutes.  Awake  after sleep onset 39 minutes.  Arousal index 63 which is increased.   RESPIRATORY DATA:  CPAP titration protocol.  CPAP was titrated to 15 CWP,  RDI 1/hour using a medium Resmed full face mask with heatage humidifier.  This was well tolerated.  He had taken all of his medications before arrival  at the sleep center.  A full face mask was chosen because of nasal  congestion.   OXYGEN DATA:  Before CPAP achieved control, snoring was moderate to loud and  oxygen desaturated to a nadir of 87%.  With CPAP control snoring was stopped  and oxygen saturation held 93-94%.   CARDIAC DATA:  Normal cardiac rhythm.   MOVEMENT/PARASOMNIA:  A total of 1406 limb jerks were recorded of which 95  were associated with arousal or awakening for a periodic limb movement with  arousal index of 18.7 which is markedly abnormal, indicating periodic limb  movement syndrome.   IMPRESSION/RECOMMENDATION:  Successful CPAP titration at 15 CWP, RDI 1/hour  using a medium Resmed full face mask  with heatage humidifier.  Comparison  diagnostic NPSG October 13, 2003 recorded an RDI of  26.9/hour.  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  This may require  specific therapy such as clonazepam or Requip if clinically appropriate.      CDY/MEDQ  D:  01/08/2004 10:47:03  T:  01/09/2004 07:00:16  Job:  [id number]

## 2010-08-24 NOTE — Consult Note (Signed)
[name], [name]                           ACCOUNT NO.:  [id number]   MEDICAL RECORD NO.:  [id number]                   PATIENT TYPE:  OUT   LOCATION:  XRAY                                 FACILITY:  [facility]   PHYSICIAN:  Zachary George, DO                      DATE OF BIRTH:  02-12-1956   DATE OF CONSULTATION:  05/12/2002  DATE OF DISCHARGE:  05/07/2002                                   CONSULTATION   REASON FOR CONSULTATION:  The patient returns to clinic today as scheduled  for  confirmatory medial Snoke blocks of his left lumbar facet joints.  The  patient was last seen on 05/06/02.  He continues to complain of low back pain  without any significant change.  His pain is mainly located in the left  lumbar paraspinous region.  He rates his pain as a 3/10 on a subjective  scale today.  He continues on MS Contin 15 mg q.12h. with hydrocodone 10 mg  for breakthrough pain.  His function and quality of life indices remain  decline.  His sleep is fair to good.  I reviewed health and history form and  14 point review of systems.  No new neurological complaints.   PHYSICAL EXAMINATION:  GENERAL:  A healthy male in no acute distress.  VITAL SIGNS:  Blood pressure 134/78, pulse 90, respirations 20, O2  saturation 96% on room air.   IMPRESSION:  1. Lumbar facet syndrome.  2. Chronic low back pain.  3. Depression.  4. History of substance abuse.   PLAN:  1. Diagnostic left lumbar medial Jansma blocks.  2. Continue current medications.  3. The patient is to return to clinic in one week for followup.  I will     review the patient's pain log.  If he gets considerable relief with the     confirmatory medial Robenson blocks, we will consider for radiofrequency     neuroablation of the lumbar medial branches on the left.   PROCEDURE:  Left L2, L3, and L4 medial Stillings blocks.  L5 dorsal ramus  block.   DESCRIPTION OF PROCEDURE:  The procedure was described to the patient in  detail,  including risks, benefits, limitations, and alternatives.  Risks  include, but are not limited to bleeding, infection, nerve injury, failure  to relieve pain, increased pain, allergic reaction to medications.  The  patient understands and wishes to proceed.  Informed consent was obtained.   The patient was brought back to the fluoroscopy suite and placed on the  table in prone position.  Skin was prepped and draped in the usual sterile  fashion.  Skin and subcutaneous tissues were anesthetized with 2 cc of 1%  lidocaine at four independent needle access points.  Under direct  fluoroscopic guidance, a 22 gauge 3.5 inch spinal needle was advanced  through the junction of the  left S1 superior articular process and the  sacral ala.  The needle was also advanced to the junctions of the L3, L4,  and L5 superior articular processes with their corresponding transverse  processes.  Needle tip placement was confirmed under multiple fluoroscopic  views.  Once bony contact was made, negative aspiration was performed.  This  was then followed by the injection of small amounts of Omnipaque 180 at each  location, revealing appropriate needle placement at each site.  This was  then followed by the injection of 0.5 cc of preservative-free 0.25%  bupivacaine.  The patient tolerated the procedure well.  Discharge  instruction were given.  The patient's post-injection pain score is 1/10.  There were no complications.   The patient was educated on the above findings and recommendations and  understands.  There were no barriers to communication.                                                Zachary George, DO    JW/MEDQ  D:  05/12/2002  T:  05/13/2002  Job:  [id number]

## 2010-08-24 NOTE — Consult Note (Signed)
[name], [name]                           ACCOUNT NO.:  [id number]   MEDICAL RECORD NO.:  [id number]                   PATIENT TYPE:  REC   LOCATION:  TPC                                  FACILITY:  [facility]   PHYSICIAN:  Sondra Come, D.O.                 DATE OF BIRTH:  1956-02-23   DATE OF CONSULTATION:  02/18/2002  DATE OF DISCHARGE:                                   CONSULTATION   HISTORY OF PRESENT ILLNESS:  [name] returns to clinic today for  reevaluation. He was last seen on 01/26/02. In the interim, he has had a MRI  of his lumbar spine which does not reveal any significant degenerative disk  changes; however, he does have significant lumbar facet degenerative changes  at L3-4, left greater than right, and some facet degenerative changes at L4-  5 per the report. I reviewed the health and history form and 14-point review  of systems. [name] continues to have significant relief of his pain with  hydrocodone 10 mg two to three times per day; however, I am concerned about  patient's previous history of substance abuse, and [name] made that  known to me when he first starting coming to this clinic that he did not  want to be on any long-term narcotic-based pain medicine. Because of that  reason, we have seeking nonnarcotic alternatives. He is status post right  lumbar medial Stanke neurotomy in 1/03 with significant improvement in his  pain for several months; however, his pain has seemed to increase over the  past few months to the point where he states that he is unable to get out of  the hospital. I started him back on some hydrocodone on a temporary basis to  see if we can control his pain, and it has in fact decreased his pain  significantly although I think we need to continue to seek narcotic  alternatives in this case. His pain is mainly on the left but does spread  bilaterally. His function and quality of life indices have declined overall  but have  improved with the hydrocodone. His sleep is fair to good. He  continues following with psychiatry for depression. He continues to smoke  one and a half packs of cigarettes per day, and I counseled him in that  regard. He denies drug use. The patient denies bowel or bladder dysfunction,  fevers, chills, night sweats, or weight loss.   PHYSICAL EXAMINATION:  Blood pressure is 135/78, pulse 98, respirations 20,  O2 saturation 98% on room air. Manual muscle testing is 5/5 bilateral lower  extremities. Sensory exam intact to light touch bilateral lower extremities.  Muscle stretch reflexes symmetric bilateral lower extremities. Examination  of the back reveals tenderness to palpation, left significantly greater than  the right lumbar paraspinous muscles, between L3-4 and L5-S1 primarily. The  patient continues to have tight  hamstrings and hip flexors.   IMPRESSION:  1. Lumbar facet arthropathy with chronic low back pain.  2. History of substance abuse.  3. History of depression.   PLAN:  1. I will schedule for left lumbar facet joint injections intra-articularly.  2. Continue Mobic 7.5 mg daily.  3. Continue Norco 10 mg/325 mg one half to one p.o. b.i.d. to t.i.d. as     needed, #30, without refills. The patient does bring his medications with     him and is appropriate on pill count.  4. If patient does not get any relief with the facet joint injections, would     consider switching his narcotic-based medication to a pharmacokinetically     longer acting medication with caution again as patient does have a     history of substance abuse.  5. The patient will return to clinic for injections.   The patient was educated about findings and recommendations and understands.  There were no barriers to communication.                                               Sondra Come, D.O.    JJW/MEDQ  D:  02/18/2002  T:  02/18/2002  Job:  [id number]

## 2010-08-24 NOTE — Assessment & Plan Note (Signed)
MEDICAL RECORD NUMBER:  [id number]   DATE OF BIRTH:  [birth year]   DATE OF VISIT:  May 21, 2004   REASON FOR VISIT:  [name] is a 55 year old male with chronic back pain,  facet mediated, who has had radiofrequency x2 back about 2 years ago.  Prior  history of substance abuse, alcohol and illicit drugs.  He has been  compliant with his pill counts.  He has been clean on his urine drug  screens.   His primary physician was not comfortable managing his methadone.   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  Aquatic exercise program continues with some stationary  bicycling.   REVIEW OF SYSTEMS:  NEUROLOGIC:  Positive for increased depression.  He is  seeing a Veterinary surgeon at the [facility] on [address] and he has  another appointment with them.  He denies any suicidal ideation.  His pain  level is about a 2/10.  Pain is worse with walking, bending, sitting and  working.   SOCIAL HISTORY:  He lives alone and is divorced.  He just bought a little  dog.   PHYSICAL EXAMINATION:  VITAL SIGNS:  Blood pressure 141/90, pulse 76,  respirations 20, O2 saturations 98% on room air.  GENERAL:  Back with no tenderness to palpation.  He has 75% forward flexion  and extension.  He has normal deep tendon reflexes bilaterally.  He has full  range of motion in the hips, knees and ankles.  He has full strength in  bilateral lower extremities.   IMPRESSION:  Lumbar facet syndrome with chronic low back pain.  Medical  management, stable functionally.   PLAN:  1.  As I discussed with the patient, there is some increased risk of CNS      depression between methadone and Remeron.  He has been on stable doses      of both medications for quite awhile.  There is sometimes electrolyte      abnormalities associated with these two medications and will check a      BMET today.  Will do a UDS as well.  2.  He will see the nurse next month  for his medication pickup.  3.  I will see him back in 3 months.      AEK/MedQ  D:  05/21/2004 13:27:58  T:  05/21/2004 14:26:59  Job #:  [id number]

## 2010-08-24 NOTE — Assessment & Plan Note (Signed)
REASON FOR VISIT:  Chronic lumbar facet syndrome.   Date of last visit was May 16, 2005.  He is seeing nursing monthly.  His  pain scores are 2-3/10.  His activity level is good.  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  He can walk 30  minutes at a time.  He is able to participate in swimming at the [facility].   REVIEW OF SYSTEMS:  Positive for dizziness, confusion, anxiety, and  depression, but no suicidal thoughts.  See additional health and history for  information.   PHYSICAL EXAMINATION:  VITAL SIGNS:  Blood pressure 137/83, pulse 72,  respiratory rate 16.  NEUROLOGIC:  Mood and affect are appropriate, gait is normal.  BACK:  There is mild tenderness to palpation at the lumbosacral junction  paraspinal.  EXTREMITIES:  He has good strength of bilateral upper and lower extremities.   IMPRESSION:  Lumbar facet syndrome.   PLAN:  Continue methadone 10 mg t.i.d.  He is tolerating this well.  No  signs of dosage escalation or misuse.  We will see him back in three months  and have nursing follow him monthly for compliance monitoring.      [name], M.D.  Electronically Signed     AEK/MedQ  D:  08/09/2005 13:24:21  T:  08/10/2005 13:53:59  Job #:  [id number]

## 2010-08-24 NOTE — Assessment & Plan Note (Signed)
This 55 year old male has been followed through this clinic since 2002.  He  returns after I last saw him on July 25, 2003.  He has a history of lumbar  facet syndrome with chronic low back pain, as well as chronic depression.  He has a history of prior substance abuse with both drugs and alcohol, but  has not had any active addiction for a number of years.  He has been on  methadone 10 mg t.i.d. for over a year with one interlude of MS Contin that  was switched based on the patient's wish not to be stigmatized with  methadone.  He has done better no the methadone and has been on a stable  dosage of that now really since March 08, 2003.  Pill counts have been  accurate.  There has been no sign of dosage escalation.   He has resumed his aquatic exercise program since last fall and has even  done some part-time work 5-10 hours a week with goals of increasing that.  His pain averages 3/10, ranging from 2-8, improves with rest, heat, therapy,  and medications, and made worse with walking, bending, sitting, and working.   SOCIAL HISTORY:  Divorced.  Lives with a friend.  Last worked in 2002.   REVIEW OF SYSTEMS:  Positive for colds, respiratory infection, and shortness  of breath associated with that, now improved, weakness, confusion, anxiety,  depression, poor sleep, agitation, and nausea, but no suicidal thoughts.   PHYSICAL EXAMINATION:  Blood pressure 128/79, respirations 20, pulse 88, O2  saturation 98% on room air.  Gait is normal.  His back has 75% forward  flexion, 50% extension, and 50% lateral rotation and bending.  He has full  strength in bilateral lower extremities.  Normal deep tendon reflexes in  bilateral lower extremities.  Normal range of motion of bilateral lower  extremities.  He has intact toe walk and heel walk.  Normal hip flexion and  knee extension.   IMPRESSION:  Lumbar facet syndrome with chronic low back pain.  He has had  radiofrequency in the past with  some short-term relief.  Overall he has been  quite functional on a stable dosage of methadone.   PLAN:  Given that we really have nothing much to offer him from the pain  clinic, I think that his pain can be managed by his primary care physician,  however, he states that he just switched to a new one and needs to establish  a repertoire prior to doing this and I think that is reasonable.  I will see  him back in three months and if he remains stable during that period of  time, would ask that he follow up with his primary care.      [name], M.D.   AEK/MedQ  D:  10/25/2003 13:27:18  T:  10/25/2003 15:25:41  Job #:  [id number]   cc:   Douglass Rivers, M.D.  [facility].  [facility] [address] Resident  [place], [place] [address]  Fax: [phone]

## 2010-08-24 NOTE — Assessment & Plan Note (Signed)
[name] returns today after I last saw him on March 08, 2003.  We had  resumed his methadone 10 t.i.d. and he has reported pain levels of 2/10  currently.  States he lies down he really has 0/10 pain but pain with  general activity increases to 4, mood 7, walking ability 6, normal work 7,  relationship with other people 6, sleep 3, and enjoyment of life 6.   He has seen Dr. Donell Bautista who thought that both Remeron and Valium were  appropriate medications for him and that he does not represent any suicidal  risk.   MEDICATIONS:  1. Remeron 60 p.o. daily.  2. Valium 10 p.o. daily.  3. Senokot.  4. Methadone 10 t.i.d.   REVIEW OF SYMPTOMS:  Positive for depression and panic attacks, anxiety, but  negative for suicidal ideation.   SOCIAL HISTORY:  Lives with friend, divorced, smokes a pack a day.   FUNCTIONAL ACTIVITY:  Goes to the gym most days out of the week.  Does back  exercises on his own at home.   PHYSICAL EXAMINATION:  VITAL SIGNS:  Blood pressure 151/76, pulse 93, O2  saturation 98% on room air.  BACK:  No tenderness to palpation along the cervical, thoracic or lumbar  spine.  He has 75% range of motion forward flexion and extension, 50%  lateral rotation and bending.   Motor strength is full.  He is able to do deep knee bend.  He is able to do  toe walk and heel walk.   Mood and affect are flat but otherwise no lability or agitation.   IMPRESSION:  1. Lumbar facet syndrome, chronic low back pain.  2. Chronic depression, doing okay as per psychiatry.  3. Prior history of substance abuse, drugs and alcohol.  Currently not     using.  He has been compliant with his methadone, although he forgot his     pills today.  He will bring them back for pill count.  Have decided we     need to do this prior to issuing any new prescription.   I will see him back in three months.  He can pick up this prescription in  between now and then.      [name], M.D.   [name]  D:  04/26/2003 11:33:29  T:  04/26/2003 12:13:46  Job #:  [id number]   cc:   [name]

## 2010-08-24 NOTE — Consult Note (Signed)
NAMEJAYZIAH, [name]                           ACCOUNT NO.:  [id number]   MEDICAL RECORD NO.:  [id number]                   PATIENT TYPE:   LOCATION:                                       FACILITY:   PHYSICIAN:  Zachary George, DO                      DATE OF BIRTH:  1955/07/04   DATE OF CONSULTATION:  05/06/2002  DATE OF DISCHARGE:                                   CONSULTATION   HISTORY OF PRESENT ILLNESS:  The patient returns to the clinic today for  reevaluation.  He was last seen on April 06, 2002.  He continues to  complain mainly of left-sided lower back pain.  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  We discussed previous responses with lumbar  facet injections.  He did get four to five days of significant improvement  of his lower back pain following left lumbar facet injections a few months  back.  Currently his pain is a 3/10 on a subjective scale and fairly well  controlled with Avinza 30 mg daily in addition to Norco 10 mg/325 mg three  times per day as needed for breakthrough pain.  He does state, however, that  the facet injections did give him more relief that any medicine has to this  point.  He wishes to pursue more interventional procedures to address his  lower back pain.  I reviewed the health and history form and 14-point review  of systems.   PHYSICAL EXAMINATION:  The physical exam reveals a healthy male in no acute  distress.  Blood pressure 133/66, pulse 95, respirations 16, O2 saturation  96% on room air.  Manual muscle testing is 5/5 in bilateral lower  extremities.  Sensory exam intact to light touch in bilateral lower  extremities.  Muscle stretch reflexes are symmetric in bilateral lower  extremities.  Examination of the back reveals a level pelvis without  scoliosis.  There is minimal tenderness to palpation of bilateral lumbar  paraspinal muscles.  Range of  motion today fails to elicit significant pain  response.   IMPRESSION:  1. Chronic low back pain.  2. Lumbar facet syndrome.  3. Depression.  4. History of substance abuse.   PLAN:  1. The patient and I discussed further treatment options.  At this point it     is reasonable to proceed with confirmatory left lumbar medial Sheehy     blocks with a positive response leading to consideration for     radiofrequency neuroablation.  2. Will repeat x-rays of the lumbar spine prior to procedure to include AP,     lateral, and oblique views.  3. Will switch Avinza to MS Contin 15 mg one p.o. q.12h. hours, #60 without     refills.  The patient had a difficult time filling the 30 mg Avinza     previously as many pharmacy do not carry it.  4. Continue hydrocodone as needed for breakthrough pain.  5. The patient is to return to the clinic for lumbar medial Gonzalez blocks on     the left.   The patient was educated on the above findings and recommendations and  understands.  There were no barriers to communication.                                               Zachary George, DO   JW/MEDQ  D:  05/06/2002  T:  05/06/2002  Job:  [id number]

## 2010-08-24 NOTE — Consult Note (Signed)
NAMESAHIL, [name]                           ACCOUNT NO.:  [id number]   MEDICAL RECORD NO.:  [id number]                   PATIENT TYPE:  REC   LOCATION:                                       FACILITY:  [facility]   PHYSICIAN:  Zachary George, DO                      DATE OF BIRTH:  1956-01-19   DATE OF CONSULTATION:  05/20/2002  DATE OF DISCHARGE:                                   CONSULTATION   [name] returns to the clinic today for re-evaluation.  He was last seen  on May 12, 2002, at which time he underwent diagnostic left lumbar  medial Risko blocks and left L5 dorsal ramus block.  The patient did not  get any relief following the blocks.  He is not a candidate for  radiofrequency neuroablation of the left lumbar medial branches or dorsal  ramus or L5 dorsal ramus.  The patient continues to have significant lower  back pain which is worse with activity and described as stiffness, sharp,  stabbing, throbbing pain.  Over the course of treatment, the patient has had  only significant improvement with right lumbar medial Heindl radiofrequency  neurotomy.  This seemed to give him several months of relief until his pain  started to return a few months ago.  Interestingly, however, most of his  pain over the past few months has felt to be left-sided per patient's  report, although he tells me today that it has really been difficult to try  to pinpoint exactly where his pain is in his lower back.  The patient denies  any radicular symptoms.  All of his pain is in the lower back.  He continues  on MS Contin 15 mg q.12h. in addition to Norco 10 mg/325 mg averaging three  pills per day.  I reviewed health and history form and 14-point review of  systems.  The patient continues to be somewhat depressed.  He states his  pain is making his depression worse.  He asks if there is anything we can do  to help his pain.  He is really not interested in escalating medications  because of his  previous history of drug use.  We discussed repeating the  radiofrequency procedure on the right as is frequently done secondary to the  medial Bautista nerves growing back after several months.  Again, this seemed  to give him significant relief previously.  The patient really wishes to  proceed with repeat radiofrequency neuroablation on the right side.   PHYSICAL EXAMINATION:  GENERAL:  Reveals a healthy-appearing male in no  acute distress.  Mood and affect are slightly depressed and flat,  respectively.   VITAL SIGNS:  Blood pressure is 155/96, pulse 92, respirations 20, and O2  saturation 96% on room air.   SPINE:  There is tenderness to palpation in the lumbar paraspinal  muscles  bilaterally with pain on extension.  Manual muscle testing is 5/5 bilateral  lower extremities.  Sensory examination is intact to light touch bilateral  lower extremities.  Muscle stretch reflexes are 2+/4 bilateral patellar,  medial hamstrings and Achilles.   IMPRESSION:  1. Chronic low back pain.  2. Lumbar facet syndrome with previous significant treatment following right     lumbar medial Matsushima radiofrequency neurotomy.  Nondiagnostic left medial     Encinas blocks most recently.  3. Depression, reactive.  4. History of substance abuse.   PLAN:  1. Discussed treatment options with [name] to include medication     management versus interventional procedures to help with his pain.  At     this point, it is reasonable to proceed with a repeat right lumbar medial     Shambaugh radiofrequency neurotomy in L5, dorsal ramus radiofrequency     neurotomy to help control his lower back pain.  He has undergone the     procedure greater than one year ago with significant relief, lasting     several months.  2. Continue current medications including MS Contin 15 mg q.12h. and Norco     10 mg/325 mg up to 3 times a day as needed for breakthrough pain.  3. Continue home exercise program.  4. The patient is  to return to the clinic for radiofrequency procedure.   The patient was educated about findings and recommendations and understands.  No barriers to communication.                                               Zachary George, DO    JW/MEDQ  D:  05/20/2002  T:  05/20/2002  Job:  [id number]

## 2010-08-24 NOTE — Discharge Summary (Signed)
   [name], [name]                           ACCOUNT NO.:  [id number]   MEDICAL RECORD NO.:  [id number]                   PATIENT TYPE:  INP   LOCATION:  0347                                 FACILITY:  [facility]   PHYSICIAN:  Nanetta Batty, MD                  DATE OF BIRTH:  [birth year]   DATE OF ADMISSION:  01/11/2002  DATE OF DISCHARGE:  01/12/2002                                 DISCHARGE SUMMARY   DISCHARGE DIAGNOSES:  1. Noncardiac chest pain with normal coronaries and normal left ventricular     function.  2. History of anxiety and depression.  3. Smoking.   HISTORY OF PRESENT ILLNESS:  The patient is a 55 year old male followed at  [facility] with no prior cardiac history, who presented on January 11, 2002,  with chest pain consistent with unstable angina.  He also has a history of  depression and chronic back problems.  He developed chest pain prior to  admission.  He was seen by Thereasa Solo. Little, M.D., admitted, and started on  IV heparin.   HOSPITAL COURSE:  Catheterization was done on January 12, 2002, by Nanetta Batty, M.D., which revealed normal coronaries and normal LV function.  He  was discharged later on January 12, 2002.  He will see [place] back once for a  groin check and then follow up with [facility].   LABORATORY DATA:  Sodium 142, potassium 4.3, BUN 14, creatinine 1.2.  Liver  functions are normal.  White count 9.4, hemoglobin 14.6, hematocrit 43.5,  platelet count 268.  The CK is negative and the troponin is negative.  INR  1.0.  The chest x-ray is negative for acute disease.  The EKG reveals sinus  rhythm without acute changes.   DISCHARGE MEDICATIONS:  1. Remeron 45 mg before bed.  2. Valium as taken at home.  3. Ultram p.r.n.   DISPOSITION:  The patient is discharged in stable condition.   FOLLOW-UP:  He will follow up in our office in a couple of weeks for a groin  check and then follow up with [facility].     Abelino Derrick, P.A.                       Nanetta Batty, MD    LKK/MEDQ  D:  01/12/2002  T:  01/15/2002  Job:  [id number]   cc:   Dala Dock

## 2010-08-24 NOTE — Assessment & Plan Note (Signed)
[name] returns today, last saw me October 28, 2005 laid off.  He exercises  at the [facility] because he hurt his right knee.  Has been seen by primary care  but x-rays were negative.  I think it may have been a ligament problem but  he is feeling better now and has started back on the [employer].  He has had no  other new pains.  He has no lower extremity weakness.   Medications include methadone 10 mg t.i.d.  He continues to see a  psychiatrist, Dr. Mila Homer.   SOCIAL HISTORY:  He lives alone and is divorced.   REVIEW OF SYSTEMS:  As per health and history form.  He works part-time.   His blood pressure is 122/73, pulse 72, respirations 16, O2 saturation 97%  on room air.  In general, no acute distress.  Mood and affect appropriate.  His back has  no tenderness to palpation.  His knee has no evidence of swelling.  He has  good strength in the lower extremities, normal range of motion and deep  tendon reflexes.   IMPRESSION:  1. Lumbar facet syndrome.  2. Right knee pain/sprain improving.   PLAN:  I will see him back in three months.  Continue methadone 10 mg t.i.d.      [name], M.D.  Electronically Signed     AEK/MedQ  D:  01/02/2006 13:34:53  T:  01/04/2006 07:39:43  Job #:  [id number]   cc:   Ezzard Flax  Fax: [phone]

## 2010-08-24 NOTE — H&P (Signed)
NAMEHIEP, OLLIS                           ACCOUNT NO.:  [id number]   MEDICAL RECORD NO.:  [id number]                   PATIENT TYPE:  INP   LOCATION:  0347                                 FACILITY:  [facility]   PHYSICIAN:  Nanetta Batty, M.D.                DATE OF BIRTH:  08-21-1955   DATE OF ADMISSION:  01/11/2002  DATE OF DISCHARGE:  01/12/2002                                HISTORY & PHYSICAL   HISTORY OF PRESENT ILLNESS:  Apparently the original admission progress  notes were lost.   The patient is a 55 year old male who was admitted to [facility] 01/11/2002  with chest pain.  He has no prior history of coronary disease.  Symptoms  were consistent with unstable angina with mid sternal chest pain.  He denied  any shortness of breath, nausea, vomiting, or diaphoresis.  Described his  discomfort as a tightness in central chest to left chest which was worse  with exertion and better with rest.   PAST MEDICAL HISTORY:  1. Depression.  2. Back problems.   CURRENT MEDICATIONS:  1. Remeron 45 mg h.s.  2. Valium p.r.n.   ALLERGIES:  No known drug allergies but has been intolerant to NEURONTIN  which caused what sounds like tardive symptoms.   SOCIAL HISTORY:  He is a pack and one-half day smoker.  He denies drugs or  alcohol.  He is disabled with back problems.   FAMILY HISTORY:  Remarkable for coronary disease, hypertension, and history  of cancer.   REVIEW OF SYSTEMS:  Essentially unremarkable except as noted above.   PHYSICAL EXAMINATION:  VITAL SIGNS:  Blood pressure 135/92, pulse 92,  respirations 20, temperature 98.4.  GENERAL:  Well developed, well nourished male in no acute distress.  HEENT  Normocephalic.  Extraocular movements intact. Sclerae nonicteric.  Lids and conjunctivae within normal limits.  NECK:  Without bruit and without JVD.  CHEST:  Clear to auscultation and percussion.  CARDIAC:  Regular rate and rhythm without murmur, rub, or gallop.  Normal S1  and S2.  ABDOMEN:  Nontender and no hepatosplenomegaly.  EXTREMITIES:  Without edema.  Distal pulses are intact.  NEUROLOGIC:  Grossly intact.  He is awake, alert, oriented, and cooperative.   LABORATORY DATA:  EKG shows sinus rhythm without acute changes.   Chest x-ray showed no active disease.  Did have some bibasilar atelectasis  on the two-view x-ray.   CK, MB, and troponin were negative x 1.  INR 1.0.  Sodium 142, potassium  4.3, BUN 14, creatinine 1.2.  White count 9.4, hemoglobin 14.6, hematocrit  43.5, platelet count 268.   IMPRESSION:  1. Chest pain consistent with unstable angina.  2. History of back pain.  3. History of smoking.  4. History of depression.  5. Family history of coronary disease.   PLAN:  The patient is admitted with unstable angina to be started on  nitroglycerin, aspirin, beta blocker, and heparin.  He will be set up for  diagnostic catheterization.     Abelino Derrick, P.A.                      Nanetta Batty, M.D.    Lenard Lance  D:  03/01/2002  T:  03/01/2002  Job:  [id number]

## 2010-08-24 NOTE — Assessment & Plan Note (Signed)
MEDICAL RECORD NUMBER:  [id number]   DATE OF BIRTH:  Aug 16, 1955   DATE OF SERVICE:  Aug 30, 2004   HISTORY:  [name] is a 55 year old male with lumbar facet syndrome,  chronic low back pain, chronic depression treated by psychiatry with Remeron  and Valium, history of prior substance abuse (drug and alcohol) has been  compliant with our pill count with the nursing monthly checks in addition to  quarterly checks per MD.  Functional status has remained stable.  He  continues to swim at the [facility] for exercise.  He is independent with all his  self-care.  His pain level is about 4/10, averages 3/10 on medication, his  pain relief from medications is graded as fair.  His pain with activity in  the last 24 hours is 8/10, relationship with other people 8 and enjoyment of  life 8.  He feels like his depression has worsened somewhat, initially he  was doing very good on Lamictal and now he feels like it does not work as  well.   He can walk 20 minutes at a time.  He drives.  He is able to climb steps.  Works sporadically as an Personnel officer.   REVIEW OF SYSTEMS:  Positive for  anxiety, depression, no suicidal  ideations.  He has had some nausea and shortness of breath.   SOCIAL HISTORY:  Divorced, lives alone although his mother is visiting.   PHYSICAL EXAMINATION:  Blood pressure 115/79, pulse 90, respiratory rate 16,  O2 saturation 96% room air.   GENERAL:  No acute distress.  Mood and affect appropriate.  Back has no  tenderness to palpation.  He has 75% forward flexion, extension, lateral  rotation and bending.  He has full strength bilateral lower extremities.  His gait is without toe drag or knee instability.   IMPRESSION:  1.  Lumbar facet syndrome with chronic low back pain.  2.  Chronic depression - seeing psychiatry.   PLAN:  Continue current medications, i.e., methadone 10 mg t.i.d.  I have  run an interaction check with his current medications.  He only takes  about  5 mg of Valium a day, takes 30 mg of Remeron a day and the Lamictal although  question dose on that medication.  No change in interaction profile with the  addition of Lamictal.     AEK/MedQ  D:  08/30/2004 15:25:38  T:  08/30/2004 18:37:45  Job #:  [id number]   cc:   Douglass Rivers, M.D.  [facility].  Family Prac. Resident  [place], [place] [address]  Fax: [phone]

## 2010-08-24 NOTE — Consult Note (Signed)
[name], [name]                             ACCOUNT NO.:  [id number]   MEDICAL RECORD NO.:  [id number]                   PATIENT TYPE:  REC   LOCATION:                                       FACILITY:  [facility]   PHYSICIAN:  Zachary George, DO                      DATE OF BIRTH:  1955-08-17   DATE OF CONSULTATION:  DATE OF DISCHARGE:                                   CONSULTATION   HISTORY OF PRESENT ILLNESS:  Mr. Springborn returns to the clinic today for  reevaluation.  He was last seen on 06/08/02.  At that time, I started him on  Duragesic 2 mcg for his chronic lower back pain due to lumbar facet  arthropathy.  The patient has not had any relief with lumbar facet  injections and a second radiofrequency procedure.  He states that the  Duragesic only helps modestly.  He was unable to go to swim in the pool at  the [facility] secondary to the patch falling off.  He has obtained the occlusive  dressings, but still having some difficulty in that regard.  His pain is a  9/10 on a subjective scale.  He really feels like he needs to get back into  his exercise routine, which has helped him significantly previously.  He  denies any radicular symptoms.  I reviewed all the 14 point review of  systems.  The patient continues to feel depressed, and I think this is  somewhat reactive secondary to his pain.   His blood pressure is 144/85, pulse 93, respirations 16, O2 saturation 99%  on room air.  No new neurologic findings in the lower extremities including  motor, sensory and reflexes.  There is tenderness to palpation in the lumbar  paraspinal muscles.  I reviewed the patient's records again.  He has had an  MRI of his lumbar spine dated 02/01/02 which revealed degenerative changes  of the lower thoracic spine, and also at L1-2, as well as facet degenerative  changes at L3-4, left greater than right.  An x-ray of his lumbar spine  dated 05/07/02 did reveal a possible spondylolysis at L3 on the left and  possibly on the right.  There is a small superior end plate spur at L5.   IMPRESSION:  1. Chronic low back pain.  2. Lumbar facet syndrome.  3. 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  4. Depression, reactive.  5. History of substance abuse.   PLAN:  1. Discussed further treatment options with Mr. Macomber.  I would like to     switch him to methadone 10 mg one p.o. t.i.d. to try to get his pain     under better control.  2. Instructed him to resume his aquatic therapy exercise program.  3. Will refer to Dr. Sharolyn Douglas, orthopedic spine specialist, at [facility] for evaluation.  4. The patient is to return to the clinic in one month for reevaluation, or     sooner as needed.   The patient was educated about the findings and recommendations and  understands.  No barriers to communication.                                               Zachary George, DO    JW/MEDQ  D:  07/07/2002  T:  07/08/2002  Job:  [id number]

## 2010-08-24 NOTE — Assessment & Plan Note (Signed)
HISTORY:  The patient returns today.  I last saw him on April 26, 2003.  His methadone is at 10 mg t.i.d.  Reported pain level is averaging 2-3/10,  ranging from 2-7.  No psychiatric followup since I last him.  He continues  on Remeron and Valium.  He continues to work out at [employer], mostly swimming, but sometimes gets on  the bike as well.  No inter-current medical condition.  He is trying to switch his primary from  [facility] to the family practice at [facility].   MEDICATIONS:  1. Remeron 30 mg p.o. daily.  2. Valium 10 mg p.o. daily per psychiatry.  3. Methadone 10 mg t.i.d.   REVIEW OF SYSTEMS:  Positive for anxiety and depression, with poor sleep, as  well as fever and chills.  Denies any suicidal thoughts.   SOCIAL HISTORY:  Single, lives with a friend.   PHYSICAL EXAMINATION:  VITAL SIGNS:  Blood pressure 120/82, pulse 82,  respirations 16, O2 saturation 100% on room air.  GENERAL:  His mood appropriate.  Affect is appropriate.  Appearance is  normal.  BACK:  Has no pain to palpation.  NEUROLOGIC:  His motor strength is 4 in the bilateral lower extremities.  His range of motion is 75% forward flexion, 50% extension, 50% lateral  rotation, bending.  Mood and affect are flat, otherwise no lability or  agitation.   IMPRESSION:  1. Lumbar facet syndrome with chronic low back pain.  2. Chronic depression, stable symptomatically on Remeron and Valium.  3. History of prior substance abuse with both drugs and alcohol.   PLAN:  Has been compliant with the pill counts.  In fact, he has a couple of  extra now.  He states that he will see how he does on two tablets daily on  his own.  His actual usage pattern is somewhere between two to three tablets  daily.  I will see him back in approximately three months.      [name], M.D.   AEK/MedQ  D:  07/25/2003 09:37:44  T:  07/25/2003 11:57:24  Job #:  [id number]

## 2010-08-24 NOTE — Consult Note (Signed)
[facility]  Patient:    [name], [name] Visit Number: [id number] MRN: [id number]          Service Type: PMG Location: [facility] Attending Physician:  Sondra Come Dictated by:   Sondra Come, D.O. Proc. Date: 03/30/01 Admit Date:  03/27/2001   CC:         Moshe Salisbury. Audria Nine, M.D.   Consultation Report  REFERRING Jalacia Mattila:  Dr. Moshe Salisbury. McPherson at [facility].  Dear Dr. Audria Nine,  Thank you very much for kindly referring Mr. Leonardo Makris to the [facility] for evaluation.  Patient was seen and examined today.  Please refer to the following for details regarding the history, physical examination and treatment plan.  Once again, thank you for allowing [place] to participate in the care of Mr. Antosh.  CHIEF COMPLAINT:  Low back pain.  HISTORY OF PRESENT ILLNESS:  Mr. Whipp is a pleasant 55 year old ambidextrous male who states he has had chronic low back pain since the age of 89 following a weight-lifting injury with a hyperextension-type mechanism.  Patient has been treated at multiple clinics over the years with multiple medication trials to include anti-inflammatory medications, nonnarcotic analgesics as well as narcotic medications.  Patient has a history of drug addiction and polysubstance abuse as well as alcohol abuse.  These narcotic analgesic medications have been used with caution.  The patient states that he is not looking to get back on methadone, which he was taking chronically. Apparently, he discontinued this on his own approximately seven months ago and had a difficult time walking and tried to commit suicide with heroin overdose. He states that his back pain is typically non-radicular but occasionally has pain in the popliteal regions bilaterally.  Currently, his pain is a 7/10 on a subjective scale and described as constant, achy, throbbing, sharp, made worse with walking, bending, sitting, working,  and improved with heat and medications which currently include ibuprofen 800 mg up to three times daily, Soma, and BC powders.  Patient is under the care of a psychiatrist and is currently on Remeron and Valium.  His function and quality-of-life indices have declined.  His sleep is fair to poor.  He states that his sleep has improved since reinitiating Remeron recently.  He has had an MRI of his lumbar spine but this is not available to me at this point.  Patient states that he has been evaluated by a surgeon in the past who stated he had a facet fracture.  He states he had some type of pain injection into his back but cannot remember which type and is unsure if it helped him.  He denies bowel and bladder dysfunction.  I review health and history form and 14-point review of systems.  PAST MEDICAL HISTORY:  Drug addiction, depression and alcohol abuse.  FAMILY HISTORY:  Heart disease, cancer, diabetes.  PAST SURGICAL HISTORY:  Denies.  SOCIAL HISTORY:  Smokes one and a half packs of cigarettes per day and I counsel him on the importance of smoking cessation in terms of low back pain and overall health.  Alcohol:  Denies.  Patient is divorced, is not currently working and denies illicit drug use.  He has a Clinical research associate assisting him with disability claim.  ALLERGIES:  Some type of psychiatric drug and intolerance to Neurontin, which makes him "jerk real bad."  PHYSICAL EXAMINATION:  GENERAL:  Physical examination reveals a healthy male in no acute distress.  VITAL SIGNS:  Blood  pressure is 150/86, pulse 94, respirations 18, O2 saturation 98%.  BACK AND EXTREMITIES:  Examination of the patients back reveals level pelvis with decreased lumbar lordosis.  No scoliosis.  Range of motion is essentially full in all planes without significant discomfort.  Manual muscle testing is 5/5, bilateral lower extremities.  Sensory exam is intact to light touch, bilateral lower extremities.  Muscle  stretch reflexes are 2+/4, bilateral patellar, medial hamstrings and Achilles.  Straight leg raise is negative bilaterally.  FABER is negative bilaterally.  Patient has significantly tight hamstrings and hip flexors, worse on the left than the right.  Palpatory examination of the back reveals tenderness to palpation at the right lumbosacral region.  Distal pulses are present bilaterally.  No heat, erythema or edema in the lower extremities.  IMPRESSION: 1. Chronic low back pain.  This may be multifactorial in etiology.  I suspect    patient has a component of facet-mediated pain versus discogenic in nature. 2. History of substance abuse including drugs and alcohol. 3. History of depression.   PLAN: 1. Discussed at length further options regarding patients low back pain.    Initially, I want to gather more data to include MRI report of the lumbar    spine. 2. Will discontinue ibuprofen 800 mg as it is causing GI upset.  Will give    patient Bextra 20 mg one p.o. q.d., samples, #30. 3. Discussed further options for patient regarding interventional procedures    to help #1, with diagnosis, and #2, with therapeutics.  This may include    facet blocks versus lumbar epidural steroid injection.  Will consider    these, based on patients response to Bextra, continue home exercise    program for which he was prescribed in physical therapy and imaging    studies. 4. Patient to return to clinic in two weeks for reevaluation.  Patient was educated in the above findings and recommendations and understands.  There were no barriers to communication. Dictated by:   Sondra Come, D.O. Attending Physician:  Sondra Come DD:  03/30/01 TD:  03/31/01 Job: [id number]

## 2010-08-24 NOTE — Assessment & Plan Note (Signed)
Monday, April 21, 2006:   This is an office visit. Last seen March 25, 2006.   A 55 year old male with lumbar facet arthropathy who has had no relief  in the past with lumbar facet injections. Been managed with methadone.  He has been maintained basically on 10 mg t.i.d. over the last year. He  has been on that dose since July 2006. He has had some changes in his  psychiatric medications and was switched. I do not have any psychiatry  notes. I did reduce his methadone to 10 b.i.d. to see whether a  potential interaction may have bumped his methadone level. We went down  to 10 mg b.i.d. and he had some type of stimulant prescribed. It feels  like his tiredness is a little bit improved. His pain level, this visit,  is 4/10 compared to 3 to 4 last visit. He walks 20 minutes at a time. 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   REVIEW OF SYSTEMS:  Positive for dizziness, confusion, depression and  anxiety, nausea, abdominal pain and shortness of breath, fevers, chills  and night sweats. He sees Amanda Pea as well as Dr. Mila Homer, his other  physicians.   SOCIAL HISTORY:  He lives alone.   PHYSICAL EXAMINATION:  Affect is flat. He has normal orientation x3. In  general, no acute. Blood pressure 130/83, pulse 79, respiration rate 16,  O2 sat is 99% on room air.  GENERAL: In no acute distress.   BACK: Has no tenderness to palpation. He is able to bend forward as well  as bend backward about 75% range, but has more pain with bending  backwards than with forward. Lower extremity strength is full. Lower  extremity range of motion normal. Lower extremity deep tendon reflexes  are normal.   IMPRESSION:  Lumbar facet arthropathy. Functionally, he has done better  and pain scores have not significantly changed on a smaller dose of  methadone and he has also become less sedated. It is difficult to say  whether this is  due to medication change from psychiatry versus from  reducing the methadone. In either case, he is functioning better.   PLAN:  I will continue current dose. I will see him back in 3 months. He  will have nursing visits in the interim time.      [name], M.D.  Electronically Signed     AEK/MedQ  D:  04/21/2006 14:59:37  T:  04/21/2006 21:31:14  Job #:  [id number]   cc:   Ezzard Flax  Fax: [phone]

## 2010-08-24 NOTE — Consult Note (Signed)
[facility]  Patient:    [name], [name] Visit Number: [id number] MRN: [id number]          Service Type: PMG Location: [facility] Attending Physician:  Sondra Come Dictated by:   Sondra Come, D.O. Proc. Date: 10/15/01 Admit Date:  10/15/2001                            Consultation Report  Mr. Curenton returns to clinic today as scheduled for reevaluation.  He was last seen on July 22, 2001.  In the interim he has been doing very well in terms of his low back pain with the exception of slight increase in pain over the last three days.  The patient states that he feels like he has overdone it over the past few days but his pain seems to be improving.  His pain today is a 3/10 on a subjective scale.  Overall, his function and quality of life indexes have improved.  He continues going to the [facility] approximately three days a week.  He continues on Ultracet two to six per day, but has run out. This does seem to help his pain but he is somewhat concerned about the Tylenol dose and the Ultracet given his history of hepatitis.  We discussed this and I think it is appropriate to switch him to Ultram based on this.  He does continue with psychiatry for his prescriptions of Remeron and Valium.  He takes Baclofen as needed if he has flare-up of his back pain.  I reviewed health and history form, 14 point review of systems.  PHYSICAL EXAMINATION  GENERAL:  Healthy male in no acute distress.  VITAL SIGNS:  Blood pressure 135/77, pulse 70, respirations 16, O2 saturation 96% on room air.  NEUROLOGIC:  Manual muscle testing is 5/5 bilateral lower extremities. Sensory examination intact to light touch bilateral lower extremities.  Muscle stretch reflexes are symmetric bilateral lower extremities.  There is minimal tenderness to palpation bilateral lumbar paraspinals.  IMPRESSION: 1. Lumbar facet syndrome with improved low back pain. 2. Improved functional  indexes.  PLAN: 1. Switch Ultracet to Ultram 50 mg one to two p.o. q.i.d. as needed #100 with    two refills. 2. Continue Baclofen as needed. 3. Continue to follow with psychiatry. 4. The patient is to return to clinic in three months for reevaluation.  The patient was educated on the above findings and recommendations and understands.  No barriers to communication. Dictated by:   Sondra Come, D.O. Attending Physician:  Sondra Come DD:  10/15/01 TD:  10/18/01 Job: [id number]

## 2010-08-24 NOTE — Consult Note (Signed)
[name], [name]                           ACCOUNT NO.:  [id number]   MEDICAL RECORD NO.:  [id number]                   PATIENT TYPE:  OUT   LOCATION:  XRAY                                 FACILITY:  [facility]   PHYSICIAN:  Zachary George, DO                      DATE OF BIRTH:  [birth year]   DATE OF CONSULTATION:  06/08/2002  DATE OF DISCHARGE:  05/07/2002                                   CONSULTATION   The patient returns to clinic today for reevaluation.  He was last seen on  05/27/2002 at which time he underwent repeat radiofrequency neural ablation  of the right lumbar facet joints.  He has not noticed any improvement at all  in his lower back pain since the procedure, although it may be somewhat  early to tell.  Her has continued on MS Contin 15 mg twice a day but states  that it really was not helping him so much.  In the interim since I saw him  last, one of his very close friends died, and he states he was going through  a pretty rough time.  His pain increased significantly, and he used up all  of his hydrocodone.  He called requesting refill, but I denied the refill  per our controlled substance agreement.  The patient was due for refill on  his MS Contin, and I increased the dose to 30 mg q.12h.  The patient states  that he is unable to tolerate the MS Contin as it makes him feel too groggy.  He did not bring his medications with him today.  We discussed further  medication options.  Again, we discussed that these medications are being  used for his pain, and he continues to be concerned about the medications  and his history of substance abuse.  I believe he has a legitimate medical  need for pain medications as he complains of severe lower back pain with  severe lumbar facet arthropathy and degenerative disk changes.  He continues  performing aquatic therapy with some benefit.  His pain limits his function  ability significantly which causes him to feel somewhat depressed.   He rates  his pain today as an 8/10 on subjective scale.  I reviewed the health and  history form and 14-point Review of Systems.   PHYSICAL EXAMINATION:  GENERAL:  Healthy male in no acute distress.  Mood is  depressed.  Affect is flat.  VITAL SIGNS:  Blood pressure 148/92, pulse 91, respirations 20, O2  saturation 97% on room air.  NEUROLOGIC:  No new neurologic findings in the lower extremities including  motor, sensory, and reflexes.  There is tenderness to palpation bilateral  lumbar paraspinous muscles.  Range of motion is guarded secondary to pain.   IMPRESSION:  1. Chronic low back pain.  2. Lumbar facet syndrome.  3. Degenerative disk disease  of the lumbar spine.  4. Spondylolysis.  5. Depression, reactive.  6. History of substance abuse.   PLAN:  1. Discussed further treatment option with the patient.  It is reasonable to     switch him to Duragesic 50 mcg q.72h.  We had given him a trial of     Duragesic 25 mcg previously which really seemed to help him for about a     week and then lost effectiveness.  I think increasing the dose to 50 mcg     is reasonable.  The Duragesic likely has a lesser side effect profile     than the morphine.  I instructed the patient to bring his MS Contin pills     back to clinic today for pill count, and we will destroy his MS Contin     and start him on Duragesic 50 mcg.  He does this with full consent.  2. Will obtain urine drug screen for compliance purposes.  3. The patient is to return to clinic in one month for reevaluation.   The patient was educated about findings and recommendations and understands.  No barriers to communication.   ADDENDUM:  The patient brings his MS Contin pills to clinic today and has 53  of 60 pills remaining.  These were flushed down the toilet by the nursing  staff with witness.  The patient was given prescription for Duragesic 50 mcg  q.72h., #10 without refills.                                                 Zachary George, DO    JW/MEDQ  D:  06/08/2002  T:  06/08/2002  Job:  [id number]

## 2010-08-24 NOTE — Cardiovascular Report (Signed)
[name], GRAUBERGER                           ACCOUNT NO.:  [id number]   MEDICAL RECORD NO.:  [id number]                   PATIENT TYPE:  OIB   LOCATION:  2860                                 FACILITY:  [facility]   PHYSICIAN:  Nanetta Batty, MD                  DATE OF BIRTH:  October 16, 1955   DATE OF PROCEDURE:  01/12/2002  DATE OF DISCHARGE:  01/12/2002                              CARDIAC CATHETERIZATION   PROCEDURE PERFORMED:  Cardiac catheterization.   INDICATION:  The patient is a 55 year old white male, admitted January 11, 2002, with unstable angina.  Positive risk factors include family history  and ongoing tobacco abuse.  He ruled out for myocardial infarction and had  no ECG changes. __________ for diagnostic coronary arteriography.   DESCRIPTION OF PROCEDURE:  The patient was brought to the second floor [facility] in the postabsorptive state.  He was  premedicated with p.o. Valium and received IV Nubain at the end of the case.  His right groin was prepped and shaved in the usual sterile fashion.  Then  1% Xylocaine was used for local anesthesia.  A 6 French sheath was inserted  into the right femoral artery using standard Seldinger technique. A 6 French  right and left Judkins diagnostic catheter as well as a 6 French pigtail  catheter were used for selective coronary angiography, left  ventriculography, supravalvular aortography, and distal abdominal  aortography. Omnipaque dye was used for the entirety of the case.  Retrograde, aortic, left ventricular, and pullback pressures were recorded.   HEMODYNAMICS:  1. Aortic systolic pressure 121, diastolic pressure 68.  2. Left ventricular systolic pressure 122, diastolic pressure 19.   SELECTIVE CORONARY ANGIOGRAPHY:  1. Left main:  Normal.  2. LAD:  Normal.  3. Left circumflex:  Normal.  4. Right coronary artery:  Dominant and normal.   LEFT VENTRICULOGRAPHY:  The RAO left  ventriculogram was performed using 20  cc of Omnipaque dye at 10 cc/sec.  The overall LVEF was estimated at  approximately 50-55% without focal wall motion abnormalities.   SUPRAVALVULAR AORTOGRAPHY:  Performed in the LAO cranial view using 20 cc of  Omnipaque dye at 20 cc/sec.  There was no AI.  There as no aortic  dissection.  All Weintraub arch vessels were intact.   DISTAL ABDOMINAL AORTOGRAPHY:  Distal abdominal aortogram was performed  using  20 cc of Omnipaque dye at 20 cc/sec. The renal arteries were widely patent.  Infrarenal abdominal aorta and iliac bifurcation appeared free of  significant atherosclerotic changes.   IMPRESSION:  The patient has normal coronary arteries, normal left  ventricular function, and no evidence of aortic dissection.  He has normal  renal arteries.  I do not know the etiology of his chest pain.   ACT was measured and the sheaths were removed.  Pressure was held on the  groin  to achieve hemostasis.  The patient left the lab in stable condition.  He will be discharged home later today and will be seen back by a PA in the  office once for groin check and then by [facility] __________.  He left  the lab in stable condition.                                                   Nanetta Batty, MD    JB/MEDQ  D:  01/12/2002  T:  01/15/2002  Job:  [id number]   cc:   Cardiac Catheterization Laboratory   [facility]

## 2010-08-24 NOTE — Consult Note (Signed)
[facility]  Patient:    [name], [name] Visit Number: [id number] MRN: [id number]          Service Type: PMG Location: [facility] Attending Physician:  Sondra Come Dictated by:   Sondra Come, D.O. Proc. Date: 05/14/01 Admit Date:  03/27/2001                            Consultation Report  Mr. Bonfanti returns to clinic today as scheduled for reevaluation.  He underwent lumbar medial Coryell neurotomy on the right on April 27, 2001. Currently, he states that he is significantly improved overall.  His current pain level is a 2/10 on a subjective scale.  His function and quality of life indexes have significantly improved.  His sleep has improved as well.  He continues to take Remeron and Ultracet without any adverse effects.  He states that the Ultracet helps his pain significantly.  I reviewed the health and history form and 14 point review of systems.  Patient denies any new neurologic complaints.  Denies bowel or bladder dysfunction.  Patient continues to smoke one and a half packs of cigarettes per day and I counsel him on the importance of smoking cessation.  PHYSICAL EXAMINATION  GENERAL:  Healthy male in no acute distress.  VITAL SIGNS:  Blood pressure 132/77, pulse 88, respirations 16, O2 saturation 97% on room air.  BACK:  Limited range of motion secondary to guarding.  There is less tenderness to palpation bilateral lumbar paraspinals.  NEUROLOGIC:  Manual muscle testing is 5/5 bilateral lower extremities. Sensory examination is intact to light touch bilateral lower extremities. Muscle stretch reflexes are 2+/4 bilateral lower extremities.  IMPRESSION: 1. Lumbar facet syndrome with improvement following radiofrequency ablation of    the right medial branches. 2. History of depression.  PLAN: 1. Continue home exercise program.  I counseled patient on importance of    activity modification and not to overdo it as this may worsen his  symptoms. 2. Continue Ultracet one p.o. q.i.d. p.r.n. for pain #100 with one refill. 3. Patient to return to clinic in six weeks for reevaluation.  Patient was educated on the above findings and recommendations and understands.  There were no barriers to communication. Dictated by:   Sondra Come, D.O. Attending Physician:  Sondra Come DD:  05/14/01 TD:  05/15/01 Job: [id number]

## 2010-08-24 NOTE — Consult Note (Signed)
[facility]  Patient:    [name], [name] Visit Number: [id number] MRN: [id number]          Service Type: PMG Location: TPC Attending Physician:  Sondra Come Dictated by:   Sondra Come, D.O. Proc. Date: 04/23/01 Admit Date:  03/27/2001                            Consultation Report  REASON FOR FOLLOWUP:  Mr. Mounce returned to clinic today as scheduled for reevaluation.  He was last seen on April 16, 2001 and underwent right lumbar facet blocks/medial Shealy blocks.  Patient states that he had at least 50% relief of his low back pain following the blocks.  His pain today is a 3/10 on a subjective scale.  His function and quality-of-life indices seemed to improve somewhat transiently during ______ .  He states that he was able to walk a little bit faster and further.  He continues to complain of low back pain, however.  I reviewed health and history form and 14-point review of systems.  Patient continues to smoke one and a half pack of cigarettes a day and I counseled him on the importance of smoking cessation.  PHYSICAL EXAMINATION:  GENERAL:  Physical examination reveals a healthy male in no acute distress.  VITAL SIGNS:  Blood pressure is 147/88, pulse 86, respirations 16, O2 saturation 98% on room air.  NEUROMUSCULAR:  No new neurologic findings in the lower extremities including motor, sensory and reflexes.  There is still some tenderness to palpation in bilateral lumbar paraspinal muscles.  IMPRESSION: 1. Facet syndrome of lumbar spine with significant positive response to lumbar    facet blocks. 2. History of substance abuse. 3. History of depression.  PLAN: 1. In light of patients positive response to the lumbar facet blocks, we will    schedule him for radiofrequency ablation of the medial branches.  I    discussed this procedure with Mr. Hoagland at length including the risks,    benefits, limitations and alternatives.  He  understands there are risks    involved including bleeding, infection, nerve injury, failure to decrease    pain and increased pain.  He wishes to proceed.  We will schedule him for    next week. 2. Continue current medications including Bextra. 3. Patient to return to clinic next week for radiofrequency procedure.  Patient was educated in the above findings and recommendations and understands.  No barriers to communication. Dictated by:   Sondra Come, D.O. Attending Physician:  Sondra Come DD:  04/23/01 TD:  04/26/01 Job: [id number]

## 2010-08-24 NOTE — Assessment & Plan Note (Signed)
MEDICAL RECORD NUMBER:  [id number]   HISTORY:  A 55 year old male with lumbar facet syndrome, chronic low-back  pain, chronic depression, with prior history of substance abuse. Functional  status has remained stable, actually a bit better. 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   His pain has remained at 3-4/10; made better with medications; worse with  certain activities such as walking, bending, sitting.   He has some fevers and chills and night sweats. Has had some shortness of  breath and chest pain. Follows up with his primary physician.   SOCIAL HISTORY:  He is divorced, lives alone.   INTERVAL HISTORY:  Has started on Lamictal before last visit and really  feels like this has helped a lot. He was diagnosed with bipolar depression  as well.   His pill counts have been accurate. He stores his medication in a secure  location. He has had no other intercurrent problems.   PHYSICAL EXAMINATION:  GENERAL:  No acute distress.  VITAL SIGNS:  Blood pressure 125/71, pulse 71, respirations 16, O2  saturation 87% room air.  MUSCULOSKELETAL:  Gait is normal. His back has no tenderness to palpation,  forward flexion about 75%, extension is to neutral, leg strength is full.   IMPRESSION:  1.  Lumbar facet syndrome, chronic low-back pain.  2.  Chronic depression as well as bipolar disorder.   PLAN:  1.  Continue the methadone 10 t.i.d. I warned him to contact us if he has      any medication additions, given the numerous interactions with      methadone.  2.  Continue his Valium and Remeron and Lamictal through his psychiatrist.   I will see him in 3 months and we will have him be a nursing visit next  time, do pill counts, monitor compliance and intercurrent medication usage.      [name], M.D.  Electronically Signed     AEK/MedQ  D:  11/26/2004 15:32:09  T:  11/26/2004 16:20:41   Job #:  [id number]   cc:   Douglass Rivers, M.D.  [facility].  Family [facility]ac. Resident  [place], [place] [address]  Fax: [phone]

## 2010-08-24 NOTE — Consult Note (Signed)
NAMELIBORIO, SACCENTE                           ACCOUNT NO.:  [id number]   MEDICAL RECORD NO.:  [id number]                   PATIENT TYPE:  REC   LOCATION:  TPC                                  FACILITY:  [facility]   PHYSICIAN:  Sondra Come, D.O.                 DATE OF BIRTH:  December 24, 1955   DATE OF CONSULTATION:  01/26/2002  DATE OF DISCHARGE:                                   CONSULTATION   HISTORY OF PRESENT ILLNESS:  The patient returns to the clinic today for  reevaluation.  He was last seen on December 16, 2001.  He continues to have  increase in his low back pain over the past two to three weeks, which has  been fairly severe.  His pain is mainly on the left greater than right side  with radicular symptoms into the left lower extremity, especially behind his  left knee.  His function has decreased considerably with his exacerbation.  He has been taking the Mobic 7.5 mg daily, which is not really helping him.  He denies any bowel or bladder dysfunction.  Denies any increased pain with  coughing, sneezing, or bowel movement.  His pain is somewhat different than  the pain he had prior to the lumbar medial Mclennan blocks and radiofrequency  procedure, which helped him significantly.  I reviewed the health and  history form and 14-point review of systems.  The patient is concerned about  his pain, primarily because he has a history of substance abuse and has  concerns regarding further medication management as he is not really  responding any further to Ultram, and we discussed this at length.  He  continues to smoke, and I counseled him as well.   PHYSICAL EXAMINATION:  GENERAL:  Healthy male in no acute distress.  VITAL SIGNS:  Blood pressure 141/96, pulse 101, respirations 18, O2  saturation 97% on room air.  BACK:  Examination of the back reveals tenderness to palpation bilateral  lumbar paraspinal muscles with some mild spasm.  There is limitation to  flexion and extension  secondary to pain.  Manual muscle testing is 5/5  bilateral lower extremities.  Sensory exam intact to light touch bilateral  lower extremities.  Muscle stretch reflexes are symmetric bilateral lower  extremities.  Straight leg raise and Pearlean Brownie are negative bilaterally.   IMPRESSION:  1. Chronic low back pain with exacerbation.  The patient likely has     recurrence of his lumbar facet syndrome versus diskogenic etiology as he     does have some degenerative disk changes.  2. History of substance abuse.  3. History of depression.   PLAN:  1. Will obtain a repeat MRI of his lumbar spine to rule out herniated     nucleus pulposus and to evaluate his facet joints.  2. Increase Mobic to 15 mg daily.  3. Flexeril 5 mg 1  p.o. q.8h. as needed, #30 without refills.  4. Will try Norco 10 mg/325 mg 1/2 to 1 p.o. t.i.d. as needed, #30 without     refills.  Patient counseled on the use of this medication and will     monitor closely.  5. Patient to return to clinic after MRI.  Would consider intervention     procedures to decrease the patient's pain and narcotic exposure.   The patient was educated on the above findings and recommendations and  understands.  There were no barriers to communication.                                               Sondra Come, D.O.    JJW/MEDQ  D:  01/26/2002  T:  01/26/2002  Job:  [id number]

## 2010-08-24 NOTE — Consult Note (Signed)
[facility]  Patient:    [name], [name] Visit Number: [id number] MRN: [id number]          Service Type: PMG Location: [facility] Attending Physician:  Sondra Come Dictated by:   Sondra Come, D.O. Proc. Date: 06/24/01 Admit Date:  03/27/2001                            Consultation Report  REASON FOR CONSULTATION:  [name] returns to clinic today for re-evaluation.  The patient states that he has had increased lower back pain over the past two weeks.  His pain has not returned all the way to baseline, however.  He feels somewhat frustrated by his pain.  He has been taking Ultracet 4 to 5 times per day with modest improvement.  His pain today is a 3/10 on a subjective scale.  His function and quality of life indices remain stable.  His sleep is fair to good.  He occasionally takes Pamelor 10 mg at bedtime with improved sleep.  He continues to go to the [facility] where he is performing a walking program.  He did begin to swim some, but he feels like he may have overdone it, and began having some increased pain.  He points to his upper lumbar paraspinal region bilaterally.  His pain is dull, achy, constant. I review health and history form and 14 point review of systems.  No bowel or bladder dysfunction.  PHYSICAL EXAMINATION:  GENERAL:  A healthy male in no acute distress.  VITAL SIGNS:  Blood pressure 162/99, pulse 88, respiratory rate 18, O2 saturation 98% on room air.  BACK:  Level pelvis without scoliosis.  There is tenderness to palpation bilateral upper lumbar paraspinal muscles.  Range of motion is guarded. Manual muscle testing is 5/5 bilateral lower extremities.  Sensory examination is intact to light touch bilateral lower extremities.  Muscle stretch reflexes are 2+/4 bilateral patellar, medial hamstrings, and Achilles.  Straight leg raise is negative bilaterally because of some pain in the low back. Hamstrings are mildly tight.  [name] is  negative bilaterally.  Hip flexors are tight bilaterally.  IMPRESSION: 1. Lumbar facet syndrome. 2. Myofascial component with lumbar paraspinous myofascial pain.  I believe    the patients tight hip flexors are contributing to this as well. 3. Tobacco abuse.  PLAN: 1. Instructed [name] in hip flexor stretching which he can add to his    current exercise program. 2. We will add Baclofen 10 mg 1/2 to 1 p.o. t.i.d. p.r.n. spasm. 3. We will given Bextra 20 mg one p.o. q.d. #30 samples. 4. The patient is instructed to use alternating ice and heat for therapeutic    relief. 5. The patient is to continue aquatic exercise program. 6. The patient is to return to clinic in one month for re-evaluation.  The patient was educated on the above findings and recommendations and understands.  There were no barriers to communication. Dictated by:   Sondra Come, D.O. Attending Physician:  Sondra Come DD:  06/24/01 TD:  06/25/01 Job: [id number]

## 2010-08-24 NOTE — Consult Note (Signed)
[name], [name]                           ACCOUNT NO.:  [id number]   MEDICAL RECORD NO.:  [id number]                   PATIENT TYPE:  REC   LOCATION:  [facility]                                  FACILITY:  [facility]   PHYSICIAN:  Sondra Come, D.O.                 DATE OF BIRTH:  April 02, 1956   DATE OF CONSULTATION:  12/16/2001  DATE OF DISCHARGE:                                   CONSULTATION   The patient returns to clinic today for reevaluation.  He was last seen on  October 15, 2001.  In the interim patient has been doing very well with his  medication regimen for low back pain.  He also continues with a home  exercise program including swimming, stretching, and some crunches.  He  asked about further therapeutic exercises and we discussed this.  He  currently takes Ultram four to six per day and Baclofen just as needed.  His  pain is a 2/10 on a subjective scale.  His function and quality of life  indexes have improved significantly.  His sleep is good.  He continues to  have intermittent bouts of depression for which he is being treated with  Remeron and Valium.  I reviewed health and history form and 14 point review  of systems.  No new neurologic complaints.   PHYSICAL EXAMINATION:  GENERAL:  Healthy male in no acute distress.  VITAL SIGNS:  Blood pressure 124/78, pulse 94, respirations 18, O2  saturation 95% on room air.  BACK:  Examination of the back reveals full range of motion with minimal  discomfort in the lumbar paraspinous region bilaterally to palpation.  NEUROLOGIC:  Manual muscle testing is 5/5 bilateral lower extremities.  Sensory examination is intact to light touch bilateral lower extremities.  Muscle stretch reflexes are symmetric bilateral lower extremities.  Straight  leg raise negative bilaterally.  FABER negative bilaterally.  The patient  noted to have tight hamstrings and hip flexors.   IMPRESSION:  1. Lumbar facet syndrome with significantly improved low back  pain.  2. History of depression.   PLAN:  1. Continue Ultram 50 mg one to two p.o. q.i.d. as needed number 150 with     two refills.  2. Continue Baclofen just as needed for spasm.  3. Discussed further therapeutic exercises with the patient to include     lumbar stabilization exercises and a handout was provided.  4. The patient to return to clinic in three months for reevaluation or     sooner as needed.   The patient was educated on the above findings and recommendations and  understands.  No barriers to communication.  Sondra Come, D.O.    JJW/MEDQ  D:  12/16/2001  T:  12/16/2001  Job:  [id number]

## 2010-08-24 NOTE — Consult Note (Signed)
[facility]  Patient:    [name], [name] Visit Number: [id number] MRN: [id number]          Service Type: PMG Location: [facility] Attending Physician:  Sondra Come Dictated by:   Sondra Come, D.O. Proc. Date: 04/13/01 Admit Date:  03/27/2001   CC:         Moshe Salisbury. Audria Nine, M.D.   Consultation Report  HISTORY:  The patient returns today as scheduled for reevaluation.  He was initially seen on March 30, 2001.  He currently complains of persistent low back pain rating it at a 3/10 on a subjective scale.  He has started Bextra 20 mg q.d. and has not seen much effect.  His function and quality of life indices remain somewhat declined.  His sleep is fair.  His pain is mainly located in the central portion of his lumbar spine and typically nonradiating. I reviewed the health and history form and 14-point review of systems.  No new neurologic complaints.  PHYSICAL EXAMINATION:  GENERAL:  Healthy male in no acute distress.  VITAL SIGNS:  Blood pressure 144/81, pulse 108, respirations 18, O2 saturation 97% on room air.  BACK:  Level pelvis without scoliosis.  There is mildly decreased lumbar lordosis.  There is pain to palpation of the lumbar paraspinals with deep palpation.  No muscle guarding or spasm noted.  NEUROLOGIC:  Manual Muscle testing is 5/5 in bilateral lower extremities. Sensory examination is intact to light touch in bilateral lower extremities. Muscle stretch reflexes are 2+/4 in bilateral lower extremities.  Range of motion of the lumbar spine reveals pain on extension and extension plus rotation, especially to the right today.  There is also moderate discomfort on flexion.  LABORATORY DATA:  MRI of the lumbar spine dated September 27, 1999 reveals severe degenerative facet arthropathic changes at left L3-4 with marked facet hypertrophy.  There is also facet hypertrophy at L4-5 and L5-S1 levels bilaterally.  At L3-4, there is a small  central subligamentous herniated nucleus pulposus.  IMPRESSION: 1. Chronic low back pain.  I suspect multifactorial etiology to include facet    arthropathy and possible discogenic component. 2. History of substance abuse including drugs and alcohol. 3. History of depression.  PLAN: 1. Continue Bextra 20 mg q.d.  Will consider increasing to b.i.d. dosing. 2. Discussed further treatment options.  The patient is reluctant to take any    medication orally to include opiate analgesics secondary to his history of    substance abuse.  Therefore, I believe an interventional approach may    benefit the patient.  We discussed lumbar facet blocks for diagnostics as    well as therapeutics.  I explained the procedure in detail to include the    risks, benefits, limitations and alternatives.  The patient wishes to    proceed.  We will begin by blocking the right sided facets.  A positive    response may lead to radiofrequency ablation of the medial branches.  Will    consider left lumbar facet blocks as well. 3. The patient will return to the clinic this week for the above-noted    procedure.  The patient was educated on the above findings and recommendations and understands.  There were no barriers to communication. Dictated by:   Sondra Come, D.O. Attending Physician:  Sondra Come DD:  04/13/01 TD:  04/13/01 Job: [id number]

## 2010-08-24 NOTE — Consult Note (Signed)
[name], [name]                             ACCOUNT NO.:  [id number]   MEDICAL RECORD NO.:  [id number]                    PATIENT TYPE:  REC   LOCATION:                                       FACILITY:   PHYSICIAN:  Zachary George, DO                      DATE OF BIRTH:  December 06, 1955   DATE OF CONSULTATION:  05/27/2002  DATE OF DISCHARGE:                                   CONSULTATION   [facility]   HISTORY OF PRESENT ILLNESS:  [name] returns to the clinic today as  scheduled for repeat right lumbar medial Garoutte radiofrequency neurotomy.  The patient was seen last on 05/20/02.  He continues to feel somewhat  depressed secondary to his pain.  He continues to follow up with his  psychiatrist.  His pain today is a 3/10 on subjective scale and improved  with medications including MS Contin 15 mg b.i.d. and Norco 10 mg three  times a day.  Despite his low pain score, his function has really not  improved and I think that he is minimizing his pain score to some degree as  he states his pain has kept him in bed pretty much this whole week and he is  somewhat frustrated and has a degree of reactive depression.  He has gotten  significant improvement with right lumbar medial Nappi radiofrequency  approximately one year ago and it likely his medial branches have grown back  at this point.  He has undergone diagnostic medial Ketcher blocks in the left  to see if that were the generator of his pain and he really did not get any  significant response in that regard.  We are trying to minimize his narcotic  exposure as he does have a history of substance abuse.  I reviewed the  Health and History Form and 14-Point Review of Systems.  The patient denies  any radicular component to this lower extremities.   PHYSICAL EXAMINATION:  GENERAL:  A healthy male in no acute distress.  Mood  is somewhat depressed.  Affect is flat.  VITAL SIGNS:  Blood pressure 143/94,  pulse 86, respirations 16.  O2  saturation 97% on room air.   IMPRESSIONS:  1. Lumbar facet syndrome.  2. Chronic low back pain.  3. Depression, reactive.  4. History of substance abuse.   PLAN:  1. Radiofrequency and neural oblation right L2, L3, L4 medial branches and     L5 dorsal ramus.  2. Continue current medications.  A new prescription for Norco 10 mg/325, 1     p.o. q.i.d. as needed, #16 without refills.  3. Patient to return to clinic in ten days for reevaluation.   The procedure was described to patient in detail including risks, benefits,  limitations and alternatives.  Risks include but not limited to bleeding,  infection, nerve injury, failure to relieve pain, increased pain, paralysis,  allergic reaction to medications.  The patient understands and wishes to  proceed.  Informed consent was obtained.   PROCEDURE:  The patient was brought back to the fluoroscopy suite where an  IV was started.  The patient was placed on the table in prone position.  Skin was prepped and draped in the usual sterile fashion.  Skin and  subcutaneous tissues were anesthetized with 2 cc of 1% lidocaine at four  independent needle access points.  Under direct fluoroscopic guidance, a 20  gauge RFK radiofrequency needle with 10 mm curved active tip was advanced to  the junction of the right S1 superior articular process in the sacral ala.  Using independent needle access points, the radiofrequency needle was  advanced to the junctions of the L3, L4 and L5 superior articular processes  and their corresponding transverse processes using multiple fluoroscopic  views.  Needle tip placement was confirmed.  After negative aspiration with  injection of a small amount of Omnipaque 180 revealing appropriate needle  placement.  Each site was then stimulated appropriately for sensory and  motor.  Each site was then injected with 0.25 cc of Kenalog 40 mg/cc, plus  0.5 cc of 1% lidocaine.  Needle tip  placement was confirmed again and the  site was then lesioned at 85 degrees for 90 seconds.  The patient tolerated  the procedure well.  There were no complications.  The patients  postprocedure pain score is 1/10.  Discharge instructions were given.  The  patient was monitored and released in stable condition.   The patient was educated in above findings and recommendations and  understands.  No barriers to communication.                                               Zachary George, DO    JW/MEDQ  D:  05/27/2002  T:  05/28/2002  Job:  [id number]

## 2010-08-24 NOTE — Consult Note (Signed)
[name], [name]                           ACCOUNT NO.:  [id number]   MEDICAL RECORD NO.:  [id number]                   PATIENT TYPE:  OUT   LOCATION:  XRAY                                 FACILITY:  [facility]   PHYSICIAN:  Sondra Come, D.O.                 DATE OF BIRTH:  11-16-1955   DATE OF CONSULTATION:  03/08/2002  DATE OF DISCHARGE:  02/01/2002                                   CONSULTATION   REASON FOR CONSULTATION:  The patient returns to the clinic today as  scheduled for reevaluation.  He was last seen on February 22, 2002; at which  time, he underwent left L3/4 and L4/5 facet joint injections which offered  him some modest improvement of approximately 30% for just about four to five  days.  He continues to have pain in the bilateral lumbar regions today  pointing mainly to the right side.  He is status post radiofrequency  neurotomy of the right medial branches with significant improvement for  several months.  His pain today is 3/10 on subjective scale with medications  including hydrocodone 10 mg/325 mg three times daily.   We discussed further treatment options to include medications versus  repeating the facet joint injections versus repeating the radiofrequency  neurotomy.  At this point, the patient wishes to proceed with more  conservative measures including medications, although he does have concerns  on using chronic opiate therapy because of his history of substance abuse,  although he has been very compliant with the hydrocodone and has not  displayed any addiction or seeking behavior.  We discussed this at length  and the patient understands the implications and potential problems  associated with his history of drug abuse.  Despite this, the patient states  that he cannot go on living like he is now because the pain is such that it  limits him from getting out of the house and into the community.  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  I reviewed the  health and history form and 14-point review of systems.   PHYSICAL EXAMINATION:  GENERAL:  Reveals a healthy male in no acute  distress.  VITAL SIGNS:  Blood pressure 137/89, pulse 90, respirations 20.  O2  saturation 98% on room air.  BACK:  Reveals tenderness to palpation bilateral lumbar paraspinals rather  diffusely.  NEUROLOGICAL:  Manual muscle testing is 5/5 bilateral lower extremities.  Sensory exam is intact to light touch bilateral lower extremities.  Muscle  stretch reflexes are 2+/4 bilateral patella, medial hamstrings, and  Achilles.  Straight leg raises are negative bilaterally in the seated  position.   IMPRESSION:  1. Chronic low back pain.  2. Lumbar facet syndrome.  3. History of substance abuse.  4. History  of depression.   PLAN:  1. After a long discussion with the patient regarding treatment options as     noted in the history of present illness, we will proceed with     conservative measures at this point at least a month or two.  I will     start him on Duragesic 25 mcg q.72h. #10 without refills.  Will also     continue Norco 10 mg/325 mg 1 p.o. b.i.d. p.r.n. for breakthrough pain     #30 without refills.  I counseled him on the use of these medications and     will be cautious given the patient's history of substance abuse.  2. Continue Mobic.  3. Consider repeat radiofrequency neurotomy of the right medial branches if     right lower lumbar pain persist.  4. The patient is to return to the clinic in one month for reevaluation.   DISPOSITION:  The patient was educated in the above findings,  recommendations, and understands.  No barriers to communication.                                               Sondra Come, D.O.    JJW/MEDQ  D:  03/08/2002  T:  03/08/2002  Job:  [id number]

## 2010-09-07 ENCOUNTER — Ambulatory Visit (INDEPENDENT_AMBULATORY_CARE_PROVIDER_SITE_OTHER): Payer: Medicaid Other | Admitting: Family Medicine

## 2010-09-07 ENCOUNTER — Other Ambulatory Visit: Payer: Self-pay | Admitting: Family Medicine

## 2010-09-07 ENCOUNTER — Encounter: Payer: Self-pay | Admitting: Family Medicine

## 2010-09-07 VITALS — BP 126/84 | HR 63 | Temp 97.7°F | Wt 164.0 lb

## 2010-09-07 DIAGNOSIS — D485 Neoplasm of uncertain behavior of skin: Secondary | ICD-10-CM

## 2010-09-08 NOTE — Progress Notes (Signed)
  Shave Biopsy Procedure Note  Pre-operative Diagnosis: Suspicious lesion (however likely seborrheic keratosis)  Post-operative Diagnosis: same  Locations:inferior to right eye (at orbital ridge)  Indications: suspicious lesion  Anesthesia: Lidocaine 2% with epinephrine without added sodium bicarbonate  Procedure Details  History of allergy to iodine: no  Patient informed of the risks (including bleeding and infection) and benefits of the  procedure and Written informed consent obtained.  The lesion and surrounding area were given a sterile prep using alcohol and draped in the usual sterile fashion. A scalpel was used to shave an area of skin approximately 0.5 cm by 0.3 cm.  Hemostasis achieved with silver nitrate. Antibiotic ointment and a sterile dressing applied.  The specimen was sent for pathologic examination. The patient tolerated the procedure well.  EBL: negligible  Findings: Hyperpigmented papule 0.4 x 0.2 cm located inferior to right eye, at orbital ridge   Condition: Stable  Complications: none.  Plan: 1. Instructed to keep the wound dry and covered for 24-48h and clean thereafter. 2. Warning signs of infection were reviewed.   3. Recommended that the patient use OTC acetaminophen as needed for pain.  4. Return as needed. Will call with results.

## 2010-12-18 ENCOUNTER — Encounter: Payer: Self-pay | Admitting: Family Medicine

## 2010-12-18 ENCOUNTER — Ambulatory Visit (INDEPENDENT_AMBULATORY_CARE_PROVIDER_SITE_OTHER): Payer: Medicaid Other | Admitting: Family Medicine

## 2010-12-18 DIAGNOSIS — I10 Essential (primary) hypertension: Secondary | ICD-10-CM

## 2010-12-18 DIAGNOSIS — F339 Major depressive disorder, recurrent, unspecified: Secondary | ICD-10-CM

## 2010-12-18 DIAGNOSIS — K219 Gastro-esophageal reflux disease without esophagitis: Secondary | ICD-10-CM | POA: Insufficient documentation

## 2010-12-18 DIAGNOSIS — Z23 Encounter for immunization: Secondary | ICD-10-CM

## 2010-12-18 DIAGNOSIS — J029 Acute pharyngitis, unspecified: Secondary | ICD-10-CM

## 2010-12-18 MED ORDER — OMEPRAZOLE 20 MG PO CPDR: 20.0000 mg | DELAYED_RELEASE_CAPSULE | Freq: Every day | ORAL | Status: DC

## 2010-12-18 NOTE — Assessment & Plan Note (Signed)
Patient does seem to be very depressed. Patient has seen a psychologist later today has been on the medical and likely will either need to increase or change of medications. Patient told to keep [place] in the loop and will call me with any changes in his medications.

## 2010-12-18 NOTE — Assessment & Plan Note (Signed)
Patient's blood pressure is at goal today we'll had no changes in management. Patient having history of hepatitis C and not having labs we will get a complete metabolic panel today.

## 2010-12-18 NOTE — Assessment & Plan Note (Signed)
Based on patient's symptoms and nighttime awakenings due to possible heartburn we'll treat for gastroesophageal reflux disease. We'll have patient start ppi which patient has taken in the past but stopped taking. We will start high dose and have patient take it twice a day and then go to once daily. Will have patient followup here in approximately 2-4 weeks to make sure he is improving. Patient has had a colonoscopy within the last 3 years and within normal he continues we should consider doing an upper GI with his history of chronic alcohol abuse as well as hepatitis C.

## 2010-12-18 NOTE — Progress Notes (Signed)
  Subjective:    Patient ID: [name], male    DOB: [birth year], 55 y.o.   MRN: [id number]  HPI 55 year old male coming in with abdominal and chest pain. 1. abdominal pain: Patient states that he has had it for approximately 6 months intermittently and states that it can come on at any time possibly worse with not eating. She states that the pain can seem worse at night. States that the pain stays mostly on the left side states that he has not had any type of constipation or diarrhea. Patient though does state when he wakes up in the morning he does have a bitter taste in his mouth. Denies any type of fever, chills, or coughing no hematemesis no bleeding or melena.   2.  chest pain: Patient states that he has had this chest pain for the last 2 months intermittently likely associated with his anxiety and bipolar disease he states. Patient states it's worse when he is alone and he becomes anxious. Patient has a psychologist he sees on a regular basis and he has an appointment today. I have been trying to change the medications around and he has not been taking his Klonopin as regular as he should. Patient states that this pain is left-sided nonreproducible non-radiation and states that it could potentially be associated with his abdominal pain. Patient has had a catheterization done within the last year and a half was completely normal. Patient denies any type of chest pain that is exacerbated by any type of exertion no swelling or sweating associated with it either.  Review of Systems As above in HPI Past medical history, social, surgical and family history all reviewed.     Objective:   Physical Exam Constitutional: He appears well-developed and well-nourished. No distress.  Head: Normocephalic and atraumatic.  Nose: Nose normal.  Mouth/Throat: Oropharynx is clear and moist. No oropharyngeal exudate.  Eyes: Conjunctivae are normal. Pupils are equal, round, and reactive to light.  Neck: Neck  supple.  Cardiovascular: Normal rate, regular rhythm and normal heart sounds.   Pulmonary/Chest: Effort normal and breath sounds normal.  Lymphadenopathy:   He has no cervical adenopathy.  Abd: Nontender and nondistended bowel sounds positive in all 4 quadrants patient has no hepatosplenomegaly.       Assessment & Plan:  No problem-specific assessment & plan notes found for this encounter.

## 2010-12-18 NOTE — Patient Instructions (Addendum)
Nice to meet you. I think the pain you're having is related to reflux. I'm giving you a medicine to try called omeprazole. I want you to take one pill twice daily for the next week then go to one pill daily. I want you to followup with her psychologist. I want you to come back and see me in 3-4 weeks.

## 2011-04-24 ENCOUNTER — Encounter: Payer: Self-pay | Admitting: Family Medicine

## 2011-04-24 ENCOUNTER — Ambulatory Visit (INDEPENDENT_AMBULATORY_CARE_PROVIDER_SITE_OTHER): Payer: Medicare Other | Admitting: Family Medicine

## 2011-04-24 DIAGNOSIS — I10 Essential (primary) hypertension: Secondary | ICD-10-CM

## 2011-04-24 DIAGNOSIS — F411 Generalized anxiety disorder: Secondary | ICD-10-CM

## 2011-04-24 DIAGNOSIS — K5900 Constipation, unspecified: Secondary | ICD-10-CM | POA: Insufficient documentation

## 2011-04-24 DIAGNOSIS — Z125 Encounter for screening for malignant neoplasm of prostate: Secondary | ICD-10-CM | POA: Insufficient documentation

## 2011-04-24 DIAGNOSIS — M545 Low back pain, unspecified: Secondary | ICD-10-CM

## 2011-04-24 DIAGNOSIS — Z0000 Encounter for general adult medical examination without abnormal findings: Secondary | ICD-10-CM

## 2011-04-24 DIAGNOSIS — F319 Bipolar disorder, unspecified: Secondary | ICD-10-CM

## 2011-04-24 LAB — COMPREHENSIVE METABOLIC PANEL
ALT: 21 U/L (ref 0–53)
AST: 26 U/L (ref 0–37)
Albumin: 4.5 g/dL (ref 3.5–5.2)
Alkaline Phosphatase: 88 U/L (ref 39–117)
BUN: 14 mg/dL (ref 6–23)
CO2: 28 mEq/L (ref 19–32)
Calcium: 9 mg/dL (ref 8.4–10.5)
Chloride: 104 mEq/L (ref 96–112)
Creat: 0.95 mg/dL (ref 0.50–1.35)
Glucose, Bld: 78 mg/dL (ref 70–99)
Potassium: 4.1 mEq/L (ref 3.5–5.3)
Sodium: 140 mEq/L (ref 135–145)
Total Bilirubin: 0.3 mg/dL (ref 0.3–1.2)
Total Protein: 7 g/dL (ref 6.0–8.3)

## 2011-04-24 LAB — LDL CHOLESTEROL, DIRECT: Direct LDL: 70 mg/dL

## 2011-04-24 MED ORDER — MIRTAZAPINE 15 MG PO TABS: 15.0000 mg | ORAL_TABLET | Freq: Every day | ORAL | Status: DC

## 2011-04-24 MED ORDER — ALPRAZOLAM 1 MG PO TABS: 1.0000 mg | ORAL_TABLET | Freq: Two times a day (BID) | ORAL | Status: DC | PRN

## 2011-04-24 MED ORDER — DOCUSATE SODIUM 100 MG PO CAPS: 100.0000 mg | ORAL_CAPSULE | Freq: Two times a day (BID) | ORAL | Status: DC | PRN

## 2011-04-24 MED ORDER — ARIPIPRAZOLE 5 MG PO TABS: 5.0000 mg | ORAL_TABLET | Freq: Every day | ORAL | Status: DC

## 2011-04-24 MED ORDER — POLYETHYLENE GLYCOL 3350 17 GM/SCOOP PO POWD: 17.0000 g | Freq: Two times a day (BID) | ORAL | Status: DC | PRN

## 2011-04-24 NOTE — Assessment & Plan Note (Signed)
Discussed with patient at length patient is not having any urinary symptoms but does have constipation and is concerned do to pain in the perennium. We'll get PSA.

## 2011-04-24 NOTE — Progress Notes (Signed)
  Subjective:    Patient ID: [name], male    DOB: [birth year], 56 y.o.   MRN: [id number]  HPI  74-year-old male coming in for her chronic comorbidities followup. 1. Hypertension Blood pressure at home: Not checking Blood pressure today: 133/91 Taking Meds: Yes Side effects: No ROS: Denies headache visual changes nausea, vomiting, chest pain or abdominal pain or shortness of breath.  2. patient has had been having pain near the rectal area. Patient states that his notification can come on any time. Patient though does state that it does radiate to his stomach from time to time. Patient does state he's had hard stool recently but denies any melena or Bright red Blood per rectum. Patient denies any type of weight gain or weight loss no testicular pain no dysuria or any trouble with keeping urine stream. Patient is on chronic narcotic use which increases his risk of constipation, patient gets chronic narcotics from a pain center.  3. Anxiety- patient is seen by Dr. Mila Homer. Patient does have some of his medications change recently this is reflected in patient's medication reconciliation. Patient states since that change has been feeling much better anxiety is a little more control as well as his bipolar disease. Denies any homicidal or suicidal ideation feels that he is doing well overall.  4 GERD-patient has had gastroesophageal reflux disease but omeprazole since that time has had no problems continues to take a daily basis denies any nausea vomiting or any weight loss out of the ordinary. Review of Systems As stated in history of present illness    Objective:   Physical Exam  Constitutional: He appears well-developed.  Eyes: EOM are normal. Pupils are equal, round, and reactive to light.  Neck: Normal range of motion. Neck supple.  Cardiovascular: Normal rate and regular rhythm.   No murmur heard. Pulmonary/Chest: Effort normal and breath sounds normal.  Abdominal: Soft. He exhibits no  distension. There is no tenderness.  Genitourinary: Rectum normal and prostate normal.       Patient is constipated has some stool in rectal vault good rectal tone  Musculoskeletal: Normal range of motion.  Neurological: He is alert.  Psychiatric: He has a normal mood and affect.       Some mild tangental thoughts      Assessment & Plan:  BIPOLAR DISORDER The patient is doing very well sees Dr. Mila Homer. Medications her reconsolidation today.  HYPERTENSION, BENIGN Still elevated overall. Patient is known to start medications if he can help it. Told him would have him follow up again in 3 months time encourage diet and exercise. Also told him to keep a journal with at least weekly measurements. Goal is a systolic blood pressure less than 140. We'll get labs today  Screening for prostate cancer  Discussed with patient at length patient is not having any urinary symptoms but does have constipation and is concerned do to pain in the perennium. We'll get PSA.  Constipation Secondary to pain meds put patient on Colace and MiraLAX encourage increased fiber intake followup as needed.

## 2011-04-24 NOTE — Assessment & Plan Note (Addendum)
Still elevated overall. Patient is known to start medications if he can help it. Told him would have him follow up again in 3 months time encourage diet and exercise. Also told him to keep a journal with at least weekly measurements. Goal is a systolic blood pressure less than 140. We'll get labs today

## 2011-04-24 NOTE — Patient Instructions (Signed)
Good to see you. I have updated your medication list. I think that the pain you're having might be related to constipation. Try taking Colace and MiraLAX up to 2 times a day as needed. I will get some labs on you today and I will call you when I get the results. Your blood pressure is minorly elevated. Please try to monitor it and keep a journal. I should probably see you again in 3-4 months to make sure you're blood pressure is coming down.

## 2011-04-24 NOTE — Assessment & Plan Note (Signed)
Secondary to pain meds put patient on Colace and MiraLAX encourage increased fiber intake followup as needed.

## 2011-04-24 NOTE — Assessment & Plan Note (Signed)
The patient is doing very well sees Dr. Mila Homer. Medications her reconsolidation today.

## 2011-04-25 LAB — PSA, TOTAL AND FREE
PSA, Free Pct: 11 % — ABNORMAL LOW (ref >25)
PSA, Free: <0.1 ng/mL
PSA: 0.38 ng/mL (ref <=4.00)

## 2011-05-07 ENCOUNTER — Telehealth: Payer: Self-pay | Admitting: Family Medicine

## 2011-05-07 MED ORDER — OSELTAMIVIR PHOSPHATE 75 MG PO CAPS: 75.0000 mg | ORAL_CAPSULE | Freq: Two times a day (BID) | ORAL | Status: AC

## 2011-05-07 NOTE — Telephone Encounter (Signed)
LMOVM informing patient. [name] Dawn  

## 2011-05-07 NOTE — Telephone Encounter (Signed)
Fine, but do not think it will be helpful, please call pt and tell him.

## 2011-05-07 NOTE — Telephone Encounter (Signed)
[name] was in 1/16 for a physical and he said he had been exposed to the flu.  He said he was told if he does get the flu to call and something can be called in for him.  He is sure he has the flu and would like something sent to [facility] on [address].

## 2011-08-09 ENCOUNTER — Other Ambulatory Visit: Payer: Self-pay | Admitting: Family Medicine

## 2011-09-06 ENCOUNTER — Encounter: Payer: Self-pay | Admitting: Family Medicine

## 2011-09-06 ENCOUNTER — Ambulatory Visit (INDEPENDENT_AMBULATORY_CARE_PROVIDER_SITE_OTHER): Payer: Medicare Other | Admitting: Family Medicine

## 2011-09-06 VITALS — BP 145/91 | HR 91 | Temp 98.0°F | Ht 67.0 in | Wt 176.0 lb

## 2011-09-06 DIAGNOSIS — I10 Essential (primary) hypertension: Secondary | ICD-10-CM

## 2011-09-06 DIAGNOSIS — K5900 Constipation, unspecified: Secondary | ICD-10-CM

## 2011-09-06 MED ORDER — FLUTICASONE PROPIONATE 50 MCG/ACT NA SUSP: 2.0000 | Freq: Every day | NASAL | Status: DC

## 2011-09-06 MED ORDER — ATENOLOL 50 MG PO TABS: 50.0000 mg | ORAL_TABLET | Freq: Every day | ORAL | Status: DC

## 2011-09-06 NOTE — Patient Instructions (Signed)
We are going to start a new medication for your blood pressure. It is called atenolol.  Take one pill daily. I have also refilled your Flonase for your nose. This will help your allergies. I would like to see you again in 3 weeks to make sure you blood pressure is improving. It has been my privilege to get to know you over the years.

## 2011-09-06 NOTE — Assessment & Plan Note (Signed)
Looking back to patient's chart he has always had some mildly high blood pressures. We will start atenolol again at this point to help his situational anxiety as well.

## 2011-09-06 NOTE — Assessment & Plan Note (Signed)
Patient appears to be doing very well, discussed potentially using MiraLAX as needed especially with patient still using chronic narcotics. Patient though is eating more fiber to his diet and thinks that that is improving.

## 2011-09-06 NOTE — Progress Notes (Signed)
  Subjective:    Patient ID: [name], male    DOB: [birth year], 56 y.o.   MRN: [id number]  HPI 56 year old male with a past medical history significant for depression, bipolar disease, and recurrent constipation coming in with groin pain. Patient states that this pain started again about a month ago has been very intermittent. Patient is point states that it has improved he has not had it for the last 3 or 4 days. Patient still very concerned about his prostate even though he is having no urinary symptoms no radiation of pain no fevers or weight loss. Patient states that he's been eating more fiber which is help keep his bowel movements are regular basis which is also help the pain.  Patient is also does for the last couple times patient's blood pressure has been elevated here as well as at the pain clinic. He is becoming concerned with that. Patient went to know if there's something that he could do that would help.  Patient is still seen a physician for his depression and bipolar disease. Patient also has a significant situational anxiety component to his mental disease. Patient is having trouble working a regular basis but is continuing to try to fit into society as how he puts it. Patient denies any suicidal homicidal ideation and seems to be in a better mood than last visit.   Review of Systems As stated in history of present illness    Objective:   Physical Exam Vitals reviewed Constitutional: He appears well-developed.  Eyes: EOM are normal. Pupils are equal, round, and reactive to light.  Neck: Normal range of motion. Neck supple.  Cardiovascular: Normal rate and regular rhythm.  No murmur heard. Pulmonary: Effort normal and breath sounds normal.  Abdominal: Soft. He exhibits no distension. There is no tenderness.  Musculoskeletal: Normal range of motion.  Neurological: He is alert.  Psychiatric: He has a normal mood and affect.       Some mild tangental thoughts  and  rambling    Assessment & Plan:

## 2011-09-27 ENCOUNTER — Ambulatory Visit: Payer: Medicare Other | Admitting: Family Medicine

## 2011-10-15 ENCOUNTER — Ambulatory Visit (INDEPENDENT_AMBULATORY_CARE_PROVIDER_SITE_OTHER): Payer: Medicare Other | Admitting: Family Medicine

## 2011-10-15 ENCOUNTER — Encounter: Payer: Self-pay | Admitting: Family Medicine

## 2011-10-15 VITALS — BP 142/91 | HR 70 | Temp 97.7°F | Ht 67.0 in | Wt 178.0 lb

## 2011-10-15 DIAGNOSIS — R0600 Dyspnea, unspecified: Secondary | ICD-10-CM

## 2011-10-15 DIAGNOSIS — R0609 Other forms of dyspnea: Secondary | ICD-10-CM

## 2011-10-15 DIAGNOSIS — R0989 Other specified symptoms and signs involving the circulatory and respiratory systems: Secondary | ICD-10-CM

## 2011-10-15 NOTE — Patient Instructions (Addendum)
Mr. Ruffolo,  Thank you for coming in to see me today. Your exam is normal. Given your smoking history I am concerned about decrease lung function.  To evaluate this please scheduled to have a pulmonary function test done in pharmacy clinic with Dr. Raymondo Band.   Your new primary doctor is Dr. Lula Olszewski, please f/u with her as needed.   Dr. Armen Pickup

## 2011-10-17 ENCOUNTER — Encounter: Payer: Self-pay | Admitting: Family Medicine

## 2011-10-17 DIAGNOSIS — R0600 Dyspnea, unspecified: Secondary | ICD-10-CM | POA: Insufficient documentation

## 2011-10-17 NOTE — Progress Notes (Signed)
Subjective:     Patient ID: [name], male   DOB: 1955-05-18, 56 y.o.   MRN: [id number]  HPI 56 yo M presents for evaluation of SOB. He states that this is a chronic problem occuring off and on x 10 years. He feels SOB associated with anxiety. The last episode occurred 5 days ago.  He denies chest pain, fever and cough., weight loss.  He is a former smoker who quit 7 years ago. He previously smoked 2 ppd x 30 years.    Review of Systems As per HPI     Objective:   Physical Exam BP 142/91  Pulse 70  Temp 97.7 F (36.5 C) (Oral)  Ht 5\' 7"  (1.702 m)  Wt 178 lb (80.74 kg)  BMI 27.88 kg/m2 Ambulatory pulse ox: 91 %, sitting 96% General appearance: alert, cooperative and no distress Neck: no adenopathy, no carotid bruit, no JVD, supple, symmetrical, trachea midline and thyroid not enlarged, symmetric, no tenderness/mass/nodules Lungs: clear to auscultation bilaterally Chest wall: no tenderness Heart: regular rate and rhythm, S1, S2 normal, no murmur, click, rub or gallop EXT: no LE edema, swelling or tenderness.     Assessment and Plan:

## 2011-10-17 NOTE — Assessment & Plan Note (Signed)
A: subjective dyspnea. Patient is not in distress currently. No evidence of active lung disease or PE on exam. I am concerned about his baseline pulmonary function given his significant smoking history. He has not had PFTs.  P: Referral for PFTs.  He may benefit from an inhaled bronchodilator.  He does not meet clinical criteria for COPD.

## 2011-10-21 ENCOUNTER — Ambulatory Visit: Payer: Medicare Other | Admitting: Pharmacist

## 2011-10-22 ENCOUNTER — Ambulatory Visit (INDEPENDENT_AMBULATORY_CARE_PROVIDER_SITE_OTHER): Payer: Medicare Other | Admitting: Pharmacist

## 2011-10-22 ENCOUNTER — Encounter: Payer: Self-pay | Admitting: Pharmacist

## 2011-10-22 VITALS — BP 128/83 | HR 59 | Temp 96.9°F | Ht 67.0 in | Wt 172.8 lb

## 2011-10-22 DIAGNOSIS — R0600 Dyspnea, unspecified: Secondary | ICD-10-CM

## 2011-10-22 DIAGNOSIS — R0609 Other forms of dyspnea: Secondary | ICD-10-CM

## 2011-10-22 MED ORDER — TIOTROPIUM BROMIDE MONOHYDRATE 18 MCG IN CAPS: 18.0000 ug | ORAL_CAPSULE | Freq: Every day | RESPIRATORY_TRACT | Status: DC

## 2011-10-22 NOTE — Assessment & Plan Note (Signed)
Spirometry evaluation reveals initial  "normal spirometry" however this ratio is only slightly higher than 0.70 for obstructive lung disease.  Patient has been experiencing shortness of breath for ~ 10 years.   He denies use of any therapy which improves his breathing.  Following albuterol his PFT evaluation improved by > 20% for FVC and FEV1.   He does NOT desire to take any medication which increases his anxiety however he is willing to attempt use of medication to improve breathing.    begin spiriva daily and reevaluate breathing at visit in a few weeks.  He may do best with combination of inhaled steroid AND a long acting bronchodilator combination however I would like to do this sequentially.   His values of~ 70 ratio for FEV1 to FVC are curious for COPD.   Reviewed results of pulmonary function tests.  Pt verbalized understanding of results.  Written pt instructions provided.  F/U Clinic visit with PCP for memory loss issues.   Total time in face to face counseling .

## 2011-10-22 NOTE — Patient Instructions (Addendum)
Start Spiriva  - 1 inhalation daily Return to office for Pharmacist clinic in August.

## 2011-10-22 NOTE — Progress Notes (Signed)
  Subjective:    Patient ID: [name], male    DOB: 06/26/1955, 56 y.o.   MRN: [id number]  HPI  Patient arrives concerned for shortness of breath AND worries about his ability to think - stating he has been having trouble balancing his check book.   He attempts to swim for exercise and complains that he is limited for swimming as much as a man > 3 years of age.    He is limited in activity due to his breathing.    He reports going for a walk a few days ago and needing to stop and call for an ambulance due to the fact he could NOT catch his breath.    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   He denies wheezing but does note that his symptoms are worst at night.    Review of Systems     Objective:   Physical Exam See Documentation Flowsheet (discrete results - PFTs) for complete Spirometry results. Patient provided good effort while attempting spirometry.   Albuterol Neb  Lot# [id number]    Exp. 06/2013        Assessment & Plan:  Spirometry evaluation reveals initial  "normal spirometry" however this ratio is only slightly higher than 0.70 for obstructive lung disease.  Patient has been experiencing shortness of breath for ~ 10 years.   He denies use of any therapy which improves his breathing.  Following albuterol his PFT evaluation improved by > 20% for FVC and FEV1.   He does NOT desire to take any medication which increases his anxiety however he is willing to attempt use of medication to improve breathing.    begin spiriva daily and reevaluate breathing at visit in a few weeks.  He may do best with combination of inhaled steroid AND a long acting bronchodilator combination however I would like to do this sequentially.   His values of~ 70 ratio for FEV1 to FVC are curious for  COPD.   Reviewed results of pulmonary function tests.  Pt verbalized understanding of results.  Written pt instructions provided.  F/U Clinic visit with PCP for memory loss issues.   Total time in face to face counseling .

## 2011-10-23 NOTE — Progress Notes (Signed)
Patient ID: [name], male   DOB: 11-Oct-1955, 56 y.o.   MRN: [id number] Reviewed and agree with Dr. Macky Lower documentation and management.

## 2011-11-06 ENCOUNTER — Ambulatory Visit (INDEPENDENT_AMBULATORY_CARE_PROVIDER_SITE_OTHER): Payer: Medicare Other | Admitting: Family Medicine

## 2011-11-06 ENCOUNTER — Encounter: Payer: Self-pay | Admitting: Family Medicine

## 2011-11-06 VITALS — BP 122/87 | HR 83 | Temp 98.3°F | Wt 173.0 lb

## 2011-11-06 DIAGNOSIS — J019 Acute sinusitis, unspecified: Secondary | ICD-10-CM | POA: Insufficient documentation

## 2011-11-06 MED ORDER — PHENYLEPHRINE HCL 0.5 % NA SOLN: 1.0000 [drp] | NASAL | Status: AC | PRN

## 2011-11-06 MED ORDER — AMOXICILLIN 500 MG PO CAPS: 500.0000 mg | ORAL_CAPSULE | Freq: Three times a day (TID) | ORAL | Status: AC

## 2011-11-06 NOTE — Patient Instructions (Addendum)
You have an abscess of the maxillary sinus that needs to be drained. Please schedule an appointment with your dentist ASAP to have this done. In the mean time, start taking Amoxicillin 500 mg three times per day x 10 days. If you develop persistent fevers/chills, night sweats, vomiting, or worsening pain or swelling of face, please report to ED. You may try warm compresses or ice packs to face as needed for pain. Schedule follow up appointment with your new PCP in 1 month.  Dental Abscess A dental abscess usually starts from an infected tooth. Antibiotic medicine and pain pills can be helpful, but dental infections require the attention of a dentist. Rinse around the infected area often with salt water (a pinch of salt in 8 oz of warm water). Do not apply heat to the outside of your face. See your dentist or oral surgeon as soon as possible.  SEEK IMMEDIATE MEDICAL CARE IF:  You have increasing, severe pain that is not relieved by medicine.   You or your child has an oral temperature above 102 F (38.9 C), not controlled by medicine.   Your baby is older than 3 months with a rectal temperature of 102 F (38.9 C) or higher.   Your baby is 31 months old or younger with a rectal temperature of 100.4 F (38 C) or higher.   You develop chills, severe headache, difficulty breathing, or trouble swallowing.   You have swelling in the neck or around the eye.  Document Released: 03/25/2005 Document Revised: 03/14/2011 Document Reviewed: 09/03/2006 [facility] Patient Information 2012 [place], [place].  Sinusitis Sinuses are air pockets within the bones of your face. The growth of bacteria within a sinus leads to infection. The infection prevents the sinuses from draining. This infection is called sinusitis. SYMPTOMS  There will be different areas of pain depending on which sinuses have become infected.  The maxillary sinuses often produce pain beneath the eyes.   Frontal sinusitis may cause pain  in the middle of the forehead and above the eyes.  Other problems (symptoms) include:  Toothaches.   Colored, pus-like (purulent) drainage from the nose.   Swelling, warmth, and tenderness over the sinus areas may be signs of infection.  TREATMENT  Sinusitis is most often determined by an exam.X-rays may be taken. If x-rays have been taken, make sure you obtain your results or find out how you are to obtain them. Your caregiver may give you medications (antibiotics). These are medications that will help kill the bacteria causing the infection. You may also be given a medication (decongestant) that helps to reduce sinus swelling.  HOME CARE INSTRUCTIONS   Only take over-the-counter or prescription medicines for pain, discomfort, or fever as directed by your caregiver.   Drink extra fluids. Fluids help thin the mucus so your sinuses can drain more easily.   Applying either moist heat or ice packs to the sinus areas may help relieve discomfort.   Use saline nasal sprays to help moisten your sinuses. The sprays can be found at your local drugstore.  SEEK IMMEDIATE MEDICAL CARE IF:  You have a fever.   You have increasing pain, severe headaches, or toothache.   You have nausea, vomiting, or drowsiness.   You develop unusual swelling around the face or trouble seeing.  MAKE SURE YOU:   Understand these instructions.   Will watch your condition.   Will get help right away if you are not doing well or get worse.  Document Released: 03/25/2005 Document  Revised: 03/14/2011 Document Reviewed: 10/22/2006 [facility] Patient Information 2012 [place], [place].

## 2011-11-06 NOTE — Progress Notes (Signed)
  Subjective:    Patient ID: [name], male    DOB: 04/19/1955, 56 y.o.   MRN: [id number]  HPI  Patient presents to clinic for dental pain and facial swelling.  This has been going on for about one week.  Started as a sore throat, then he felt pain on upper LT side of mouth, and now radiates to LT maxillary sinus.  Patient says he tried calling his dentist, but she is out of clinic this week.  Pain is well controlled today.  He takes his chronic back pain medications as needed for tooth pain which helps.  He denies any documented fevers at home, but does feel warm at bedtime.  He denies any nausea or vomiting or decreased appetite.  He has no known hx of diabetes.     Review of Systems  Per HPI    Objective:   Physical Exam  Constitutional: No distress.  HENT:  Head: Normocephalic and atraumatic.  Right Ear: External ear normal.  Left Ear: External ear normal.  Mouth/Throat: Oropharynx is clear and moist. No oropharyngeal exudate.       Poor dentition with missing teeth upper part of mouth and numerous dental caries; no pus drainage or palpable gum abscess; LT maxillary sinus is swollen and warm, tender to palpation  Neck: Normal range of motion. Neck supple.  Pulmonary/Chest: Effort normal and breath sounds normal. He has no wheezes. He has no rales.  Lymphadenopathy:    He has no cervical adenopathy.          Assessment & Plan:

## 2011-11-06 NOTE — Assessment & Plan Note (Signed)
Patient presents with redness, swelling of LT maxillary sinuses likely secondary to acute sinusitis vs. Abscess. He is afebrile in clinic today without signs of systemic infection. He needs to have abscess drained by either dentist or oral surgeon.  He is to schedule appointment with his dentist this week. For now, will treat with Amoxicillin 500 mg TID x 10 days. Afrin spray as needed. Follow up with PCP in 4 weeks or sooner as needed. Red flags reviewed per AVS.

## 2011-11-22 ENCOUNTER — Encounter: Payer: Self-pay | Admitting: Family Medicine

## 2011-11-22 ENCOUNTER — Ambulatory Visit: Payer: Medicare Other | Admitting: Pharmacist

## 2011-11-22 ENCOUNTER — Other Ambulatory Visit: Payer: Self-pay | Admitting: Family Medicine

## 2011-11-22 ENCOUNTER — Ambulatory Visit (INDEPENDENT_AMBULATORY_CARE_PROVIDER_SITE_OTHER): Payer: Medicare Other | Admitting: Family Medicine

## 2011-11-22 VITALS — BP 128/79 | HR 53 | Temp 98.1°F | Ht 67.0 in | Wt 175.6 lb

## 2011-11-22 DIAGNOSIS — B948 Sequelae of other specified infectious and parasitic diseases: Secondary | ICD-10-CM

## 2011-11-22 DIAGNOSIS — R5383 Other fatigue: Secondary | ICD-10-CM

## 2011-11-22 DIAGNOSIS — R0600 Dyspnea, unspecified: Secondary | ICD-10-CM

## 2011-11-22 DIAGNOSIS — J019 Acute sinusitis, unspecified: Secondary | ICD-10-CM

## 2011-11-22 DIAGNOSIS — R5381 Other malaise: Secondary | ICD-10-CM

## 2011-11-22 DIAGNOSIS — R0609 Other forms of dyspnea: Secondary | ICD-10-CM

## 2011-11-22 MED ORDER — MONTELUKAST SODIUM 10 MG PO TABS: 10.0000 mg | ORAL_TABLET | Freq: Every day | ORAL | Status: DC

## 2011-11-22 NOTE — Progress Notes (Signed)
Subjective:     Patient ID: [name], male   DOB: [birth year], 56 y.o.   MRN: [id number]  HPI 56 yo man presents to clinic today c/o 1 month history of sore throat and hoarse voice.  There is pain with swollowing and he mentions that it feels as though he has something "cought in my throat".  Pain is a 1 out of 10.  He was seen in clinic 2 weeks ago for a sinusitis and was treated with a 10 day course of amoxicillin.  Pt reports that the sore throat remained the same over the period when he was taking the antibiotics.  He also mentions that he is stuffy but this is normal for him.  He has tried cough drops which provide minimal alleviation of his sore throat.  He does not smoke cigarettes   He says that the facial pain and swelling is greatly improved with the course of amoxicillin.   Review of Systems Denies fever, muscle aches, chills, decrease energy, ear pain, nasal drainage, lymphadenopathy, N/V, diarrhea, constipation, headache    Objective:   Physical Exam Ear: Tympanic membranes clear bilaterally.  Scarring noted on right TM Eyes: no conjunctivitis, no discharge. EOM intact  Sinuses: non-tender to palpation Nose: no rhinorrhea, no discharge, no erythema Mouth/Throat: MMM, no erythema, no exudates, no palatal petechiae, no enlarged tonsils, poor oral hygeine,  No ulcers Neck: mild anterior cervical LAD, no deviation upon swallowing, no masses palpated, no enlarged thyroid Lungs: clear to ascultation bilaterally, nml work of breathing, no wheezing, no crackles Abdomen: NT/ND, no masses felt, no hepatosplenomegaly     Assessment:     56 yo with poor oral hygeine and 1 month hx of sore throat, and hoarse voice  recently treated for acute sinusitis, current symptoms most likely 2/2 post-nasal drip r/o post-viral sore throat    Plan:     Sore throat & hoarse voice: -start Singulair -continue claritin and flonase -return to clinic if symptoms do not resolve in 1-2 months... At  which point consider referral to ENT for further investigation  Poor oral hygeine -encouraged pt to f/u with dentist

## 2011-11-22 NOTE — Progress Notes (Signed)
I have seen and examined this patient with Student Billey Gosling.  Physical exam benign, not concerning for continued bacterial infection.   I agree with assessment and plan, with highlighted changes.   -Add Singulair to allergy regimen.  - stressed importance of seeing Dentist.

## 2011-11-28 ENCOUNTER — Ambulatory Visit (INDEPENDENT_AMBULATORY_CARE_PROVIDER_SITE_OTHER): Payer: Medicare Other | Admitting: Pharmacist

## 2011-11-28 ENCOUNTER — Encounter: Payer: Self-pay | Admitting: Pharmacist

## 2011-11-28 VITALS — BP 131/89 | HR 62 | Ht 67.72 in | Wt 175.0 lb

## 2011-11-28 DIAGNOSIS — R0600 Dyspnea, unspecified: Secondary | ICD-10-CM

## 2011-11-28 DIAGNOSIS — R0989 Other specified symptoms and signs involving the circulatory and respiratory systems: Secondary | ICD-10-CM

## 2011-11-28 DIAGNOSIS — R0609 Other forms of dyspnea: Secondary | ICD-10-CM

## 2011-11-28 MED ORDER — BECLOMETHASONE DIPROPIONATE 80 MCG/ACT IN AERS: 2.0000 | INHALATION_SPRAY | Freq: Two times a day (BID) | RESPIRATORY_TRACT | Status: DC

## 2011-11-28 NOTE — Assessment & Plan Note (Signed)
Continued Spiriva & Singulair.  Initiated Qvar 80 mcg/act, 2 puffs BID, with spacer device.  Counseled on proper use, side effects, and need to rinse and spit after inhalations.   Pt demonstrated adequate technique with device; RTC on 9/24 for f/u.  Pt seen with Bernadene Person, PharmD Candidate and Drue Stager, PharmD, Pharmacy Resident.

## 2011-11-28 NOTE — Progress Notes (Signed)
  Subjective:    Patient ID: [name], male    DOB: 30-Aug-1955, 56 y.o.   MRN: [id number]  HPI Pt presents to clinic for f/u visit for dyspnea.  Was started on Spiriva ~1 month ago following PFT results, and Singulair ~1 wk ago by Dr. Lula Olszewski for persistent sore throat/hoarseness.  States he has noticed less gasping for air and nocturnal awakenings since starting these two medications, but isn't sure which one is more helpful.  Rates breathlessness as limiting to 1 flight of stairs before becoming exhausted.   Review of Systems   Appears somewhat fatigued, but otherwise well.  Objective:   Physical Exam        Assessment & Plan:  Continued Spiriva & Singulair.  Initiated Qvar 80 mcg/act, 2 puffs BID, with spacer device.  Counseled on proper use, side effects, and need to rinse and spit after inhalations.   Pt demonstrated adequate technique with device; RTC on 9/24 for f/u.  Pt seen with Bernadene Person, PharmD Candidate and Drue Stager, PharmD, Pharmacy Resident.

## 2011-11-28 NOTE — Patient Instructions (Addendum)
It was good to see you today.  Start using your inhaler, two inhalations in the morning, and two at night.  Don't forget to rinse your mouth out with water afterwards.  You can keep taking your Singulair.  We will see you again in about a month to see how you are doing.

## 2011-11-28 NOTE — Progress Notes (Signed)
Patient ID: [name], male   DOB: [birth year], 56 y.o.   MRN: [id number] Reviewed and agree with Dr. Macky Lower management.

## 2011-12-05 ENCOUNTER — Ambulatory Visit: Payer: Medicare Other | Admitting: Family Medicine

## 2011-12-25 ENCOUNTER — Encounter: Payer: Self-pay | Admitting: Family Medicine

## 2011-12-25 ENCOUNTER — Ambulatory Visit (INDEPENDENT_AMBULATORY_CARE_PROVIDER_SITE_OTHER): Payer: Medicare Other | Admitting: Family Medicine

## 2011-12-25 VITALS — BP 132/88 | HR 56 | Temp 98.2°F | Ht 67.0 in | Wt 175.0 lb

## 2011-12-25 DIAGNOSIS — R413 Other amnesia: Secondary | ICD-10-CM | POA: Insufficient documentation

## 2011-12-25 DIAGNOSIS — R070 Pain in throat: Secondary | ICD-10-CM | POA: Insufficient documentation

## 2011-12-25 DIAGNOSIS — J029 Acute pharyngitis, unspecified: Secondary | ICD-10-CM

## 2011-12-25 LAB — POCT RAPID STREP A (OFFICE): Rapid Strep A Screen: NEGATIVE

## 2011-12-25 NOTE — Assessment & Plan Note (Signed)
This has not resolved after several months of watchful waiting.  Will refer to ENT for evaluation.

## 2011-12-25 NOTE — Assessment & Plan Note (Signed)
MMSE normal.  Discussed with patient that I suspect these are medication side effects, however he does not agree with me as he is taking less narcotics than he used to.  I will draw TSH, B12, and BMET to rule out organic causes, but I suggested he discuss his symptoms with his mental health provider.

## 2011-12-25 NOTE — Patient Instructions (Signed)
It was nice to see you.  I am sorry your throat is still sore and hoarse.  I am referring you to see the Ear, Nose, and Throat doctor for evaluation of this problem.   For your memory, I am checking a few labs to make sure there is not an identifiable problem.  Be reassured that your mini-mental status exam is normal and does not indicate any developing dementia.

## 2011-12-25 NOTE — Progress Notes (Signed)
  Subjective:    Patient ID: [name], male    DOB: [birth year], 56 y.o.   MRN: [id number]  HPI  [name] comes in for follow up.    Sore throat- he has had a sore throat and hoarse voice for at least 4 months now. He has been treated for sinusitis and allergic rhinitis without improvement.  He also describes the feeling that food gets caught in his throat from time to time.   Memory- patient says that he has had a lot of difficulty concentrating recently.  This has gotten worse over the past 6 months or so.  He says that especially first thing in the morning it takes him half an hour to figure out what he is supposed to do that day.  He is concerned that he will not be able to work or take care of himself is this continues.  He denies any substance use, only taking his medications.   Past Medical History  Diagnosis Date  . Hypertension   . Bipolar disorder   . Allergy   . Depression   . Hepatitis C   . Chronic pain    Family Hx: negative for dementia.   History   Social History  . Marital Status: Divorced    Spouse Name: N/A    Number of Children: N/A  . Years of Education: N/A   Occupational History  . Not on file.   Social History Main Topics  . Smoking status: Former Smoker -- 2.0 packs/day for 30 years    Types: Cigarettes    Quit date: 04/08/2005  . Smokeless tobacco: Never Used  . Alcohol Use: No  . Drug Use: No     Use to take cocaine, marijuana, per patient everything. Stopped in 1987.  Marland Kitchen Sexually Active: Not Currently -- Male partner(s)   Other Topics Concern  . Not on file   Social History Narrative  . No narrative on file     Review of Systems See HPI    Objective:   Physical Exam BP 132/88  Pulse 56  Temp 98.2 F (36.8 C) (Oral)  Ht 5\' 7"  (1.702 m)  Wt 175 lb (79.379 kg)  BMI 27.41 kg/m2 General appearance: alert, cooperative and no distress Throat: No erythema, exudates, mucous membranes moist Psych: Flat affect poor eye contact.   Does not appear to be responding to external stimuli.  Mini Mental Status Exam: 29/30.       Assessment & Plan:

## 2011-12-26 LAB — BASIC METABOLIC PANEL
BUN: 14 mg/dL (ref 6–23)
CO2: 29 mEq/L (ref 19–32)
Calcium: 9.3 mg/dL (ref 8.4–10.5)
Chloride: 106 mEq/L (ref 96–112)
Creat: 0.94 mg/dL (ref 0.50–1.35)
Glucose, Bld: 91 mg/dL (ref 70–99)
Potassium: 4.5 mEq/L (ref 3.5–5.3)
Sodium: 141 mEq/L (ref 135–145)

## 2011-12-26 LAB — VITAMIN B12: Vitamin B-12: 1053 pg/mL — ABNORMAL HIGH (ref 211–911)

## 2011-12-26 LAB — TSH: TSH: 2.665 u[IU]/mL (ref 0.350–4.500)

## 2011-12-30 ENCOUNTER — Encounter: Payer: Self-pay | Admitting: Family Medicine

## 2011-12-31 ENCOUNTER — Ambulatory Visit: Payer: Medicare Other | Admitting: Pharmacist

## 2012-01-07 ENCOUNTER — Encounter: Payer: Self-pay | Admitting: Pharmacist

## 2012-01-07 ENCOUNTER — Ambulatory Visit (INDEPENDENT_AMBULATORY_CARE_PROVIDER_SITE_OTHER): Payer: Medicare Other | Admitting: Pharmacist

## 2012-01-07 VITALS — BP 128/86 | Ht 67.0 in | Wt 174.1 lb

## 2012-01-07 DIAGNOSIS — R0600 Dyspnea, unspecified: Secondary | ICD-10-CM

## 2012-01-07 DIAGNOSIS — R0989 Other specified symptoms and signs involving the circulatory and respiratory systems: Secondary | ICD-10-CM

## 2012-01-07 DIAGNOSIS — R0609 Other forms of dyspnea: Secondary | ICD-10-CM

## 2012-01-07 NOTE — Progress Notes (Signed)
  Subjective:    Patient ID: [name], male    DOB: May 05, 1955, 56 y.o.   MRN: [id number]  HPI Patient arrives in good spirits and ambulating well.  He reports that he has been taking his Qvar and Spiriva as directed.  He notes that his breathing is much better and that he has not been hyperventilating and huffing/puffing since adding the Qvar.  He does report still waking up short of breath at night, but this is improved from where he was previously.  He does complain about dry mouth when he wakes up at night.  His physical activity, such as swimming and climbing stairs, is complicated by the pain in his back.   Review of Systems     Objective:   Physical Exam        Assessment & Plan:  Patient has responded well to the addition of Qvar to his Spiriva with reports of breathing better and no episodes of hyperventilating/huffing/puffing.  Patient instructed to continue taking both the Qvar and Spiriva at current dose/schedule and reminded to wash his mouth out after use.  Patient verbalized understanding.  If patient continues to do well into the spring, may consider a trial off of Sprivia (or every other day dosing) due to adverse effects of dry mouth.  He was instructed to follow up with Dr. Lula Olszewski in 2-3 months (around early spring).  Patient seen with Tiney Rouge, PharmD Candidate and Lillia Pauls, PharmD, Pharmacy Resident.

## 2012-01-07 NOTE — Patient Instructions (Addendum)
Continue taking your Qvar and Spiriva as you have been. Follow up with Dr. Lula Olszewski 2-3 months (around early spring).

## 2012-01-07 NOTE — Assessment & Plan Note (Signed)
Patient has responded well to the addition of Qvar to his Spiriva with reports of breathing better and no episodes of hyperventilating/huffing/puffing.  Patient instructed to continue taking both the Qvar and Spiriva at current dose/schedule and reminded to wash his mouth out after use.  Patient verbalized understanding.  If patient continues to do well into the spring, may consider a trial off of Sprivia (or every other day dosing) due to adverse effects of dry mouth.  He was instructed to follow up with Dr. Lula Olszewski in 2-3 months (around early spring).  Patient seen with Tiney Rouge, PharmD Candidate and Lillia Pauls, PharmD, Pharmacy Resident.

## 2012-01-08 NOTE — Progress Notes (Signed)
Patient ID: [name], male   DOB: Sep 13, 1955, 56 y.o.   MRN: [id number] Reviewed and agree with Dr. Macky Lower management and documentation.

## 2012-01-20 ENCOUNTER — Other Ambulatory Visit: Payer: Self-pay | Admitting: Family Medicine

## 2012-04-14 ENCOUNTER — Ambulatory Visit: Payer: Medicare Other | Admitting: Family Medicine

## 2012-04-17 ENCOUNTER — Ambulatory Visit: Payer: Medicare Other | Admitting: Family Medicine

## 2012-04-18 ENCOUNTER — Emergency Department (HOSPITAL_COMMUNITY)
Admission: EM | Admit: 2012-04-18 | Discharge: 2012-04-18 | Disposition: A | Discharge status: Home / Self Care | Payer: Medicare Other | Attending: Emergency Medicine | Admitting: Emergency Medicine

## 2012-04-18 DIAGNOSIS — Z87891 Personal history of nicotine dependence: Secondary | ICD-10-CM | POA: Insufficient documentation

## 2012-04-18 DIAGNOSIS — J029 Acute pharyngitis, unspecified: Secondary | ICD-10-CM | POA: Insufficient documentation

## 2012-04-18 DIAGNOSIS — I10 Essential (primary) hypertension: Secondary | ICD-10-CM | POA: Insufficient documentation

## 2012-04-18 DIAGNOSIS — K0889 Other specified disorders of teeth and supporting structures: Secondary | ICD-10-CM

## 2012-04-18 DIAGNOSIS — F319 Bipolar disorder, unspecified: Secondary | ICD-10-CM | POA: Insufficient documentation

## 2012-04-18 DIAGNOSIS — F329 Major depressive disorder, single episode, unspecified: Secondary | ICD-10-CM | POA: Insufficient documentation

## 2012-04-18 DIAGNOSIS — F3289 Other specified depressive episodes: Secondary | ICD-10-CM | POA: Insufficient documentation

## 2012-04-18 DIAGNOSIS — K089 Disorder of teeth and supporting structures, unspecified: Secondary | ICD-10-CM | POA: Insufficient documentation

## 2012-04-18 DIAGNOSIS — R220 Localized swelling, mass and lump, head: Secondary | ICD-10-CM | POA: Insufficient documentation

## 2012-04-18 DIAGNOSIS — R51 Headache: Secondary | ICD-10-CM | POA: Insufficient documentation

## 2012-04-18 DIAGNOSIS — R112 Nausea with vomiting, unspecified: Secondary | ICD-10-CM | POA: Insufficient documentation

## 2012-04-18 DIAGNOSIS — Z79899 Other long term (current) drug therapy: Secondary | ICD-10-CM | POA: Insufficient documentation

## 2012-04-18 MED ORDER — PENICILLIN V POTASSIUM 500 MG PO TABS: 500.0000 mg | ORAL_TABLET | Freq: Four times a day (QID) | ORAL | Status: AC

## 2012-04-18 NOTE — ED Provider Notes (Signed)
History  This chart was scribed for non-physician practitioner working with Raeford Razor, MD by Erskine Emery, ED Scribe. This patient was seen in room WTR9/WTR9 and the patient's care was started at 21:48.    CSN: [id number]  Arrival date & time 04/18/12  2100   None     No chief complaint on file.   (Consider location/radiation/quality/duration/timing/severity/associated sxs/prior Treatment) [name] is a 57 y.o. male who presents to the Emergency Department complaining of gradually worsening upper left tooth pain, left-sided facial swelling, and headache for the past 4 days.  Patient is a 57 y.o. male presenting with tooth pain. The history is provided by the patient. No language interpreter was used.  Dental PainThe primary symptoms include mouth pain, headaches and sore throat. Primary symptoms do not include dental injury, oral bleeding, oral lesions, fever, shortness of breath or cough. The symptoms began 3 to 5 days ago. The symptoms are worsening. The symptoms are new. The symptoms occur constantly.  The headache is not associated with neck stiffness or weakness.  The sore throat is not accompanied by trouble swallowing.  Additional symptoms include: gum tenderness and facial swelling. Additional symptoms do not include: gum swelling, trouble swallowing, pain with swallowing, hearing loss and nosebleeds. Medical issues include: smoking (former smoker). Medical issues do not include: alcohol problem and chewing tobacco.  Pt reports some associated sore throat and emesis this morning but denies any associated fever, chills, difficulty swallowing, neck stiffness, or neck pain. Pt has taken oxycontin and oxycodone (which he has for his back pain). Pt reports one previous episode of similar symptoms. Pt has an appointment with his dentist on Tuesday. He has NKDA.   Past Medical History  Diagnosis Date  . Hypertension   . Bipolar disorder   . Allergy   . Depression   .  Hepatitis C   . Chronic pain     No past surgical history on file.  No family history on file.  History  Substance Use Topics  . Smoking status: Former Smoker -- 2.0 packs/day for 30 years    Types: Cigarettes    Quit date: 04/08/2005  . Smokeless tobacco: Never Used  . Alcohol Use: No      Review of Systems  Constitutional: Negative for fever and chills.  HENT: Positive for sore throat, facial swelling and dental problem. Negative for hearing loss, nosebleeds, trouble swallowing, neck pain and neck stiffness.   Respiratory: Negative for cough and shortness of breath.   Gastrointestinal: Positive for nausea and vomiting.  Neurological: Positive for headaches. Negative for weakness.    Allergies  Review of patient's allergies indicates no known allergies.  Home Medications   Current Outpatient Rx  Name  Route  Sig  Dispense  Refill  . ALPRAZOLAM 1 MG PO TABS               . ARIPIPRAZOLE 5 MG PO TABS   Oral   Take 1 tablet (5 mg total) by mouth daily.         . ATENOLOL 50 MG PO TABS      TAKE 1 TABLET (50 MG TOTAL) BY MOUTH DAILY.   30 tablet   3   . BECLOMETHASONE DIPROPIONATE 80 MCG/ACT IN AERS   Inhalation   Inhale 2 puffs into the lungs 2 (two) times daily.   1 Inhaler   12   . CARISOPRODOL 350 MG PO TABS               .  DOCUSATE SODIUM 100 MG PO CAPS   Oral   Take 1 capsule (100 mg total) by mouth 2 (two) times daily as needed for constipation.         Marland Kitchen FLUTICASONE PROPIONATE 50 MCG/ACT NA SUSP   Nasal   Place 2 sprays into the nose daily.   16 g   3   . LAMOTRIGINE 100 MG PO TABS   Oral   Take by mouth. Take 2-3 tablets by mouth at bedtime.          Marland Kitchen LORATADINE 10 MG PO TABS   Oral   Take 10 mg by mouth daily.           Marland Kitchen MIRTAZAPINE 15 MG PO TABS   Oral   Take 1 tablet (15 mg total) by mouth at bedtime.         Marland Kitchen MONTELUKAST SODIUM 10 MG PO TABS   Oral   Take 1 tablet (10 mg total) by mouth at bedtime.   30  tablet   11   . OMEPRAZOLE 20 MG PO CPDR      TAKE ONE CAPSULE EVERY DAY   90 capsule   1   . OXYCODONE HCL ER 30 MG PO TB12   Oral   Take 30 mg by mouth every 12 (twelve) hours.         . OXYCODONE HCL 15 MG PO TABS   Oral   Take 15 mg by mouth every 6 (six) hours as needed. For pain         . POLYETHYLENE GLYCOL 3350 PO POWD   Oral   Take 17 g by mouth 2 (two) times daily as needed (for constipation).         . TIOTROPIUM BROMIDE MONOHYDRATE 18 MCG IN CAPS   Inhalation   Place 1 capsule (18 mcg total) into inhaler and inhale daily.   30 capsule   3     Triage Vitals: BP 140/85  Pulse 88  Temp 97.9 F (36.6 C) (Oral)  Resp 20  Wt 170 lb (77.111 kg)  SpO2 95%  Physical Exam  Nursing note and vitals reviewed. Constitutional: He is oriented to person, place, and time. He appears well-developed and well-nourished. No distress.  HENT:  Head: Normocephalic and atraumatic.       Tender to palpation of gingiva. No gingival swelling. No drainage. No submandibular or submental lymphadenopathy. No facial swelling.  Eyes: EOM are normal. Pupils are equal, round, and reactive to light.  Neck: Normal range of motion. Neck supple. No tracheal deviation present.       Full ROM of neck.  Cardiovascular: Normal rate.   Pulmonary/Chest: Effort normal. No respiratory distress.  Abdominal: Soft. He exhibits no distension.  Musculoskeletal: Normal range of motion. He exhibits no edema.  Neurological: He is alert and oriented to person, place, and time.  Skin: Skin is warm and dry.  Psychiatric: He has a normal mood and affect.    ED Course  Procedures (including critical care time) DIAGNOSTIC STUDIES: Oxygen Saturation is 95% on room air, adequate by my interpretation.    COORDINATION OF CARE: 21:46--I evaluated the patient and we discussed a treatment plan including antibiotics and follow up with dentist to which the pt agreed.    Labs Reviewed - No data to  display No results found.   No diagnosis found.    MDM  Patient with toothache.  No gross abscess.  Exam unconcerning for Ludwig's angina or  spread of infection.  Will treat with penicillin and pain medicine.  Urged patient to follow-up with dentist.    I personally performed the services described in this documentation, which was scribed in my presence. The recorded information has been reviewed and is accurate.    Pascal Lux [place], PA-C 04/19/12 [phone]

## 2012-04-18 NOTE — ED Notes (Signed)
Pt c/o dental pain on upper L side for 4 days. Pt also c/o headache and nausea. Pt states he has a dentist appt on Tues. Pt reports that he has taken oxycontin and oxycodone for pain. Pt with no acute distress.

## 2012-04-20 NOTE — ED Provider Notes (Signed)
Medical screening examination/treatment/procedure(s) were performed by non-physician practitioner and as supervising physician I was immediately available for consultation/collaboration.  Raeford Razor, MD 04/20/12 2108

## 2012-04-26 ENCOUNTER — Emergency Department (HOSPITAL_COMMUNITY): Payer: Medicare Other

## 2012-04-26 ENCOUNTER — Emergency Department (HOSPITAL_COMMUNITY)
Admission: EM | Admit: 2012-04-26 | Discharge: 2012-04-26 | Disposition: A | Discharge status: Home / Self Care | Payer: Medicare Other | Attending: Emergency Medicine | Admitting: Emergency Medicine

## 2012-04-26 ENCOUNTER — Encounter (HOSPITAL_COMMUNITY): Payer: Self-pay | Admitting: Emergency Medicine

## 2012-04-26 DIAGNOSIS — M549 Dorsalgia, unspecified: Secondary | ICD-10-CM | POA: Insufficient documentation

## 2012-04-26 DIAGNOSIS — Z79899 Other long term (current) drug therapy: Secondary | ICD-10-CM | POA: Insufficient documentation

## 2012-04-26 DIAGNOSIS — R209 Unspecified disturbances of skin sensation: Secondary | ICD-10-CM | POA: Insufficient documentation

## 2012-04-26 DIAGNOSIS — R0602 Shortness of breath: Secondary | ICD-10-CM | POA: Insufficient documentation

## 2012-04-26 DIAGNOSIS — F411 Generalized anxiety disorder: Secondary | ICD-10-CM | POA: Insufficient documentation

## 2012-04-26 DIAGNOSIS — G8929 Other chronic pain: Secondary | ICD-10-CM

## 2012-04-26 DIAGNOSIS — Z87891 Personal history of nicotine dependence: Secondary | ICD-10-CM | POA: Insufficient documentation

## 2012-04-26 DIAGNOSIS — F319 Bipolar disorder, unspecified: Secondary | ICD-10-CM | POA: Insufficient documentation

## 2012-04-26 DIAGNOSIS — R0789 Other chest pain: Secondary | ICD-10-CM | POA: Insufficient documentation

## 2012-04-26 DIAGNOSIS — F410 Panic disorder [episodic paroxysmal anxiety] without agoraphobia: Secondary | ICD-10-CM

## 2012-04-26 DIAGNOSIS — F3289 Other specified depressive episodes: Secondary | ICD-10-CM | POA: Insufficient documentation

## 2012-04-26 DIAGNOSIS — F329 Major depressive disorder, single episode, unspecified: Secondary | ICD-10-CM | POA: Insufficient documentation

## 2012-04-26 DIAGNOSIS — Z8719 Personal history of other diseases of the digestive system: Secondary | ICD-10-CM | POA: Insufficient documentation

## 2012-04-26 DIAGNOSIS — R51 Headache: Secondary | ICD-10-CM | POA: Insufficient documentation

## 2012-04-26 DIAGNOSIS — I10 Essential (primary) hypertension: Secondary | ICD-10-CM | POA: Insufficient documentation

## 2012-04-26 LAB — CBC
HCT: 43.3 % (ref 39.0–52.0)
Hemoglobin: 15.3 g/dL (ref 13.0–17.0)
MCH: 27.3 pg (ref 26.0–34.0)
MCHC: 35.3 g/dL (ref 30.0–36.0)
MCV: 77.3 fL — ABNORMAL LOW (ref 78.0–100.0)
Platelets: 289 10*3/uL (ref 150–400)
RBC: 5.6 MIL/uL (ref 4.22–5.81)
RDW: 14.3 % (ref 11.5–15.5)
WBC: 11.9 10*3/uL — ABNORMAL HIGH (ref 4.0–10.5)

## 2012-04-26 LAB — BASIC METABOLIC PANEL
BUN: 14 mg/dL (ref 6–23)
CO2: 23 mEq/L (ref 19–32)
Calcium: 10.2 mg/dL (ref 8.4–10.5)
Chloride: 101 mEq/L (ref 96–112)
Creatinine, Ser: 0.95 mg/dL (ref 0.50–1.35)
GFR calc Af Amer: >90 mL/min (ref >90)
GFR calc non Af Amer: >90 mL/min (ref >90)
Glucose, Bld: 127 mg/dL — ABNORMAL HIGH (ref 70–99)
Potassium: 4.2 mEq/L (ref 3.5–5.1)
Sodium: 139 mEq/L (ref 135–145)

## 2012-04-26 LAB — PRO B NATRIURETIC PEPTIDE: Pro B Natriuretic peptide (BNP): <5 pg/mL (ref 0–125)

## 2012-04-26 LAB — POCT I-STAT TROPONIN I: Troponin i, poc: 0 ng/mL (ref 0.00–0.08)

## 2012-04-26 MED ORDER — SODIUM CHLORIDE 0.9 % IV SOLN
Freq: Once | INTRAVENOUS | Status: AC
  Administered 2012-04-26: 17:00:00 via INTRAVENOUS

## 2012-04-26 MED ORDER — MORPHINE SULFATE 4 MG/ML IJ SOLN
4.0000 mg | Freq: Once | INTRAMUSCULAR | Status: AC
  Administered 2012-04-26: 4 mg via INTRAVENOUS
  Filled 2012-04-26: qty 1

## 2012-04-26 MED ORDER — ONDANSETRON HCL 4 MG/2ML IJ SOLN
4.0000 mg | Freq: Once | INTRAMUSCULAR | Status: AC
  Administered 2012-04-26: 4 mg via INTRAVENOUS
  Filled 2012-04-26: qty 2

## 2012-04-26 NOTE — Discharge Instructions (Signed)
Back Pain, Adult Low back pain is very common. About 1 in 5 people have back pain.The cause of low back pain is rarely dangerous. The pain often gets better over time.About half of people with a sudden onset of back pain feel better in just 2 weeks. About 8 in 10 people feel better by 6 weeks.  CAUSES Some common causes of back pain include:  Strain of the muscles or ligaments supporting the spine.  Wear and tear (degeneration) of the spinal discs.  Arthritis.  Direct injury to the back. DIAGNOSIS Most of the time, the direct cause of low back pain is not known.However, back pain can be treated effectively even when the exact cause of the pain is unknown.Answering your caregiver's questions about your overall health and symptoms is one of the most accurate ways to make sure the cause of your pain is not dangerous. If your caregiver needs more information, he or she may order lab work or imaging tests (X-rays or MRIs).However, even if imaging tests show changes in your back, this usually does not require surgery. HOME CARE INSTRUCTIONS For many people, back pain returns.Since low back pain is rarely dangerous, it is often a condition that people can learn to [facility] their own.   Remain active. It is stressful on the back to sit or stand in one place. Do not sit, drive, or stand in one place for more than 30 minutes at a time. Take short walks on level surfaces as soon as pain allows.Try to increase the length of time you walk each day.  Do not stay in bed.Resting more than 1 or 2 days can delay your recovery.  Do not avoid exercise or work.Your body is made to move.It is not dangerous to be active, even though your back may hurt.Your back will likely heal faster if you return to being active before your pain is gone.  Pay attention to your body when you bend and lift. Many people have less discomfortwhen lifting if they bend their knees, keep the load close to their bodies,and  avoid twisting. Often, the most comfortable positions are those that put less stress on your recovering back.  Find a comfortable position to sleep. Use a firm mattress and lie on your side with your knees slightly bent. If you lie on your back, put a pillow under your knees.  Only take over-the-counter or prescription medicines as directed by your caregiver. Over-the-counter medicines to reduce pain and inflammation are often the most helpful.Your caregiver may prescribe muscle relaxant drugs.These medicines help dull your pain so you can more quickly return to your normal activities and healthy exercise.  Put ice on the injured area.  Put ice in a plastic bag.  Place a towel between your skin and the bag.  Leave the ice on for 15 to 20 minutes, 3 to 4 times a day for the first 2 to 3 days. After that, ice and heat may be alternated to reduce pain and spasms.  Ask your caregiver about trying back exercises and gentle massage. This may be of some benefit.  Avoid feeling anxious or stressed.Stress increases muscle tension and can worsen back pain.It is important to recognize when you are anxious or stressed and learn ways to manage it.Exercise is a great option. SEEK MEDICAL CARE IF:  You have pain that is not relieved with rest or medicine.  You have pain that does not improve in 1 week.  You have new symptoms.  You are generally  not feeling well. SEEK IMMEDIATE MEDICAL CARE IF:   You have pain that radiates from your back into your legs.  You develop new bowel or bladder control problems.  You have unusual weakness or numbness in your arms or legs.  You develop nausea or vomiting.  You develop abdominal pain.  You feel faint. Document Released: 03/25/2005 Document Revised: 09/24/2011 Document Reviewed: 08/13/2010 [facility] Patient Information 2013 [place], [place]. Anxiety and Panic Attacks Your caregiver has informed you that you are having an anxiety or panic attack.  There may be many forms of this. Most of the time these attacks come suddenly and without warning. They come at any time of day, including periods of sleep, and at any time of life. They may be strong and unexplained. Although panic attacks are very scary, they are physically harmless. Sometimes the cause of your anxiety is not known. Anxiety is a protective mechanism of the body in its fight or flight mechanism. Most of these perceived danger situations are actually nonphysical situations (such as anxiety over losing a job). CAUSES  The causes of an anxiety or panic attack are many. Panic attacks may occur in otherwise healthy people given a certain set of circumstances. There may be a genetic cause for panic attacks. Some medications may also have anxiety as a side effect. SYMPTOMS  Some of the most common feelings are:  Intense terror.  Dizziness, feeling faint.  Hot and cold flashes.  Fear of going crazy.  Feelings that nothing is real.  Sweating.  Shaking.  Chest pain or a fast heartbeat (palpitations).  Smothering, choking sensations.  Feelings of impending doom and that death is near.  Tingling of extremities, this may be from over-breathing.  Altered reality (derealization).  Being detached from yourself (depersonalization). Several symptoms can be present to make up anxiety or panic attacks. DIAGNOSIS  The evaluation by your caregiver will depend on the type of symptoms you are experiencing. The diagnosis of anxiety or panic attack is made when no physical illness can be determined to be a cause of the symptoms. TREATMENT  Treatment to prevent anxiety and panic attacks may include:  Avoidance of circumstances that cause anxiety.  Reassurance and relaxation.  Regular exercise.  Relaxation therapies, such as yoga.  Psychotherapy with a psychiatrist or therapist.  Avoidance of caffeine, alcohol and illegal drugs.  Prescribed medication. SEEK IMMEDIATE MEDICAL  CARE IF:   You experience panic attack symptoms that are different than your usual symptoms.  You have any worsening or concerning symptoms. Document Released: 03/25/2005 Document Revised: 06/17/2011 Document Reviewed: 07/27/2009 [facility] Patient Information 2013 [place], [place].

## 2012-04-26 NOTE — ED Notes (Signed)
Pt presenting to ed with c/o chest pain and right side arm with numbness. Pt states he's unsure if he took too much of his medication accidentally today. Pt states he felt like his heart was racing. Pt states positive nausea no vomiting

## 2012-04-26 NOTE — ED Provider Notes (Signed)
History     CSN: [id number]  Arrival date & time 04/26/12  1540   First MD Initiated Contact with Patient 04/26/12 1636      Chief Complaint  Patient presents with  . Chest Pain    (Consider location/radiation/quality/duration/timing/severity/associated sxs/prior treatment) HPI Comments: Patient comes to the ER for evaluation of multiple complaints. He had an episode of what he thinks was anxiety earlier. He says he has a history of anxiety and panic attacks. He had onset of racing heartbeat, tightness across his chest. He was short of breath. All of these symptoms have completely resolved at time of arrival to the ER. He also reports numbness and tingling in his right arm. He says this happens frequently. He said he lies on his couch all day, often on his right side. He thinks that the numbness comes from pressure lying on the side. He does report that he had a throbbing headache earlier. This also is common for him. The headache has resolved. His last complaint is back pain. Has history of chronic back pain. He takes OxyContin regularly. He says he has weaned himself from 60 mg a day down to 10 mg. He did this over a fairly rapid taper. He says he has been having a lot of increased back pain since. No recent injury. Patient says that he might have taken an extra OxyContin earlier, he cannot remember.  Patient is a 57 y.o. male presenting with chest pain.  Chest Pain Primary symptoms include shortness of breath.  Associated symptoms include numbness.     Past Medical History  Diagnosis Date  . Hypertension   . Bipolar disorder   . Allergy   . Depression   . Hepatitis C   . Chronic pain     Past Surgical History  Procedure Date  . Tonsillectomy     No family history on file.  History  Substance Use Topics  . Smoking status: Former Smoker -- 2.0 packs/day for 30 years    Types: Cigarettes    Quit date: 04/08/2005  . Smokeless tobacco: Never Used  . Alcohol Use: No       Review of Systems  Respiratory: Positive for shortness of breath.   Cardiovascular: Positive for chest pain.  Musculoskeletal: Positive for back pain.  Neurological: Positive for numbness and headaches.  All other systems reviewed and are negative.    Allergies  Review of patient's allergies indicates no known allergies.  Home Medications   Current Outpatient Rx  Name  Route  Sig  Dispense  Refill  . ALPRAZOLAM 1 MG PO TABS               . ARIPIPRAZOLE 5 MG PO TABS   Oral   Take 1 tablet (5 mg total) by mouth daily.         . ATENOLOL 50 MG PO TABS      TAKE 1 TABLET (50 MG TOTAL) BY MOUTH DAILY.   30 tablet   3   . BECLOMETHASONE DIPROPIONATE 80 MCG/ACT IN AERS   Inhalation   Inhale 2 puffs into the lungs 2 (two) times daily.   1 Inhaler   12   . CARISOPRODOL 350 MG PO TABS               . DOCUSATE SODIUM 100 MG PO CAPS   Oral   Take 1 capsule (100 mg total) by mouth 2 (two) times daily as needed for constipation.         Marland Kitchen  FLUTICASONE PROPIONATE 50 MCG/ACT NA SUSP   Nasal   Place 2 sprays into the nose daily.   16 g   3   . LAMOTRIGINE 100 MG PO TABS   Oral   Take by mouth. Take 2-3 tablets by mouth at bedtime.          Marland Kitchen LORATADINE 10 MG PO TABS   Oral   Take 10 mg by mouth daily.           Marland Kitchen MIRTAZAPINE 15 MG PO TABS   Oral   Take 1 tablet (15 mg total) by mouth at bedtime.         Marland Kitchen MONTELUKAST SODIUM 10 MG PO TABS   Oral   Take 1 tablet (10 mg total) by mouth at bedtime.   30 tablet   11   . OMEPRAZOLE 20 MG PO CPDR      TAKE ONE CAPSULE EVERY DAY   90 capsule   1   . OXYCODONE HCL ER 30 MG PO TB12   Oral   Take 30 mg by mouth every 12 (twelve) hours.         . OXYCODONE HCL 15 MG PO TABS   Oral   Take 15 mg by mouth every 6 (six) hours as needed. For pain         . POLYETHYLENE GLYCOL 3350 PO POWD   Oral   Take 17 g by mouth 2 (two) times daily as needed (for constipation).         . TIOTROPIUM  BROMIDE MONOHYDRATE 18 MCG IN CAPS   Inhalation   Place 1 capsule (18 mcg total) into inhaler and inhale daily.   30 capsule   3     BP 162/101  Pulse 95  Temp 97.9 F (36.6 C) (Oral)  Resp 20  SpO2 98%  Physical Exam  Constitutional: He is oriented to person, place, and time. He appears well-developed and well-nourished. He appears distressed.  HENT:  Head: Normocephalic and atraumatic.  Right Ear: Hearing normal.  Nose: Nose normal.  Mouth/Throat: Oropharynx is clear and moist and mucous membranes are normal.  Eyes: Conjunctivae normal and EOM are normal. Pupils are equal, round, and reactive to light.  Neck: Normal range of motion. Neck supple.  Cardiovascular: Normal rate, regular rhythm, S1 normal and S2 normal.  Exam reveals no gallop and no friction rub.   No murmur heard. Pulmonary/Chest: Effort normal and breath sounds normal. No respiratory distress. He exhibits no tenderness.  Abdominal: Soft. Normal appearance and bowel sounds are normal. There is no hepatosplenomegaly. There is no tenderness. There is no rebound, no guarding, no tenderness at McBurney's point and negative Murphy's sign. No hernia.  Musculoskeletal: Normal range of motion.  Neurological: He is alert and oriented to person, place, and time. He has normal strength. No cranial nerve deficit or sensory deficit. Coordination normal. GCS eye subscore is 4. GCS verbal subscore is 5. GCS motor subscore is 6.  Skin: Skin is warm, dry and intact. No rash noted. No cyanosis.  Psychiatric: His speech is normal and behavior is normal. Thought content normal. His mood appears anxious. He expresses no homicidal and no suicidal ideation.    ED Course  Procedures (including critical care time)   Date: 04/26/2012  Rate: 93  Rhythm: normal sinus rhythm  QRS Axis: right  Intervals: normal  ST/T Wave abnormalities: normal  Conduction Disutrbances:none  Narrative Interpretation:   Old EKG Reviewed: none  available  Labs Reviewed  CBC - Abnormal; Notable for the following:    WBC 11.9 (*)     MCV 77.3 (*)     All other components within normal limits  BASIC METABOLIC PANEL - Abnormal; Notable for the following:    Glucose, Bld 127 (*)     All other components within normal limits  PRO B NATRIURETIC PEPTIDE  POCT I-STAT TROPONIN I   Ct Head Wo Contrast  04/26/2012  *RADIOLOGY REPORT*  Clinical Data: Right-sided numbness.  CT HEAD WITHOUT CONTRAST  Technique:  Contiguous axial images were obtained from the base of the skull through the vertex without contrast.  Comparison: 01/08/2005.  Findings: The ventricles are normal.  No extra-axial fluid collections are seen.  The brainstem and cerebellum are unremarkable.  No acute intracranial findings such as infarction or hemorrhage.  No mass lesions.  The bony calvarium is intact.  The visualized paranasal sinuses and mastoid air cells are clear.  IMPRESSION: No acute intracranial findings or mass lesions.   Original Report Authenticated By: Rudie Meyer, M.D.      Diagnosis: 1. Atypical chest pain 2. Paresthesia 3. Anxiety and panic attack 4. Chronic back pain    MDM  Patient presented with multiple complaints. He was concerned about numbness in his right arm, but this has been chronically present, intermittently. It seems to be a peripheral paresthesia rather than a TIA or central nervous system etiology. It is gone at time of evaluation and has a normal neurologic examination.  Patient had chest tightness and shortness of breath in association with anxiety and panic attack. This has resolved spontaneously and his cardiac workup was negative. No further workup necessary.  Patient has chronic low back pain and has been trying to taper himself off of his OxyContin. It was recommended that he slightly increase his dosing and followup with his doctor.       Gilda Crease, MD 04/26/12 1743

## 2012-05-07 ENCOUNTER — Other Ambulatory Visit: Payer: Self-pay | Admitting: Family Medicine

## 2012-05-20 ENCOUNTER — Other Ambulatory Visit (HOSPITAL_COMMUNITY): Payer: Self-pay | Admitting: Anesthesiology

## 2012-05-20 ENCOUNTER — Ambulatory Visit (HOSPITAL_COMMUNITY)
Admission: RE | Admit: 2012-05-20 | Discharge: 2012-05-20 | Disposition: A | Discharge status: Home / Self Care | Payer: Medicare Other | Source: Ambulatory Visit | Attending: Anesthesiology | Admitting: Anesthesiology

## 2012-05-20 DIAGNOSIS — M549 Dorsalgia, unspecified: Secondary | ICD-10-CM | POA: Insufficient documentation

## 2012-05-20 DIAGNOSIS — G8929 Other chronic pain: Secondary | ICD-10-CM | POA: Insufficient documentation

## 2012-06-10 ENCOUNTER — Encounter: Payer: Self-pay | Admitting: Family Medicine

## 2012-06-10 ENCOUNTER — Ambulatory Visit (INDEPENDENT_AMBULATORY_CARE_PROVIDER_SITE_OTHER): Payer: Medicare Other | Admitting: Family Medicine

## 2012-06-10 VITALS — BP 110/79 | HR 105 | Temp 97.9°F | Ht 67.0 in | Wt 173.0 lb

## 2012-06-10 DIAGNOSIS — Z23 Encounter for immunization: Secondary | ICD-10-CM

## 2012-06-10 DIAGNOSIS — M545 Low back pain, unspecified: Secondary | ICD-10-CM

## 2012-06-10 NOTE — Patient Instructions (Addendum)
I am sorry your back is hurting so badly.  Please try to stay as active as you can and continue back stretches and exercises.  I am referring you to Neurosurgery for evaluation.  The office will call you with your appointment.
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
## 2012-06-10 NOTE — Assessment & Plan Note (Signed)
Worsened lately.  Encouraged activity and back stretches.  Will refer to Neurosurgery for evaluation.

## 2012-06-17 ENCOUNTER — Other Ambulatory Visit: Payer: Self-pay | Admitting: Neurosurgery

## 2012-06-17 DIAGNOSIS — M545 Low back pain, unspecified: Secondary | ICD-10-CM

## 2012-06-22 ENCOUNTER — Telehealth: Payer: Self-pay | Admitting: Family Medicine

## 2012-06-22 DIAGNOSIS — M545 Low back pain, unspecified: Secondary | ICD-10-CM

## 2012-06-22 NOTE — Telephone Encounter (Signed)
The note is still in dictation, they ask that we call back next Monday and ask them to send it. Pt informed. He states that he does not dislike the [place] MD, but his sister in law works at [employer] and highly recommends this MD. Mudlogger, Maryjo Rochester

## 2012-06-22 NOTE — Telephone Encounter (Signed)
Looks like patient was referred to [facility]- I have not received a note about an office visit, can we call over there and see if he made his appointment and if so can they fax a note? Thanks.

## 2012-06-22 NOTE — Telephone Encounter (Signed)
This may be difficult to do as neurologist do not readily agree to giving 2nd opinions, but will forward to MD for ok to attempt. Fleeger, Maryjo Rochester

## 2012-06-22 NOTE — Telephone Encounter (Signed)
Patient would like to go for a 2nd opinion on his back.  He wants to see Dr. Fanny Dance - Neurologist - phone [phone] and fax [phone] and he really would like to see her as soon as possible.

## 2012-06-23 ENCOUNTER — Other Ambulatory Visit: Payer: Medicare Other

## 2012-06-23 NOTE — Telephone Encounter (Signed)
I will wait for the note from [place] before making any new referrals.

## 2012-06-29 NOTE — Telephone Encounter (Signed)
Contacted [facility].  Dictated note still not avaliable.  Will try back later this week. Fleeger, Maryjo Rochester

## 2012-07-06 NOTE — Addendum Note (Signed)
Addended by: Ardyth Gal on: 07/06/2012 01:47 PM   Modules accepted: Orders

## 2012-07-06 NOTE — Telephone Encounter (Signed)
Referral faxed to Dr. Ardelle Anton [phone].  [name]

## 2012-07-06 NOTE — Telephone Encounter (Signed)
Pt is asking to speak with nurse about his referral to neuro.  Needs help

## 2012-07-06 NOTE — Telephone Encounter (Signed)
1. [facility] called and they will be faxing over note.  2. Pt does not dislike the MD he saw @ [place], but this other MD is highly recommended by his sister in Social worker ( employee of [employer]) and a few other people (per pt)  He would prefer if he had surgery to have it by this MD.  Algis Downs that they are faxing over the notes and Dr. Lula Olszewski will review them and will give him a call this afternoon with his decision.  He is eagerly awaiting this call as he is in "tremendous pain".  3. Will forward to MD for decision.  Will also give her the OVN when she is in clinic this afternoon. Daneil Beem, Maryjo Rochester

## 2012-07-06 NOTE — Telephone Encounter (Signed)
Received Note from [place].  Dr. Gerlene Fee has Ordered an MRI but did not feel there was any emergency or big change in [name] condition.  Per patient request, I will order a new referral for him to be seen at [facility].  We do not have a time frame on how far out they are scheduling their appointments yet.  Please let him know we will be in touch.

## 2012-07-21 ENCOUNTER — Other Ambulatory Visit: Payer: Self-pay | Admitting: Family Medicine

## 2012-08-03 ENCOUNTER — Other Ambulatory Visit: Payer: Self-pay | Admitting: Neurosurgery

## 2012-08-03 ENCOUNTER — Ambulatory Visit
Admission: RE | Admit: 2012-08-03 | Discharge: 2012-08-03 | Disposition: A | Discharge status: Home / Self Care | Payer: Medicare Other | Source: Ambulatory Visit | Attending: Neurosurgery | Admitting: Neurosurgery

## 2012-08-03 DIAGNOSIS — M47816 Spondylosis without myelopathy or radiculopathy, lumbar region: Secondary | ICD-10-CM

## 2012-08-03 DIAGNOSIS — M5136 Other intervertebral disc degeneration, lumbar region: Secondary | ICD-10-CM

## 2012-09-07 ENCOUNTER — Ambulatory Visit (INDEPENDENT_AMBULATORY_CARE_PROVIDER_SITE_OTHER): Payer: Medicare Other | Admitting: Family Medicine

## 2012-09-07 ENCOUNTER — Encounter: Payer: Self-pay | Admitting: Family Medicine

## 2012-09-07 VITALS — BP 131/82 | HR 65 | Temp 97.7°F | Ht 67.0 in | Wt 176.0 lb

## 2012-09-07 DIAGNOSIS — R0600 Dyspnea, unspecified: Secondary | ICD-10-CM

## 2012-09-07 DIAGNOSIS — R0989 Other specified symptoms and signs involving the circulatory and respiratory systems: Secondary | ICD-10-CM

## 2012-09-07 DIAGNOSIS — R079 Chest pain, unspecified: Secondary | ICD-10-CM

## 2012-09-07 DIAGNOSIS — R0609 Other forms of dyspnea: Secondary | ICD-10-CM | POA: Insufficient documentation

## 2012-09-07 DIAGNOSIS — I10 Essential (primary) hypertension: Secondary | ICD-10-CM

## 2012-09-07 NOTE — Assessment & Plan Note (Signed)
Relatively good control on atenolol only- advised about rebound HTN on clonidine. Will check lipids and BMET.

## 2012-09-07 NOTE — Patient Instructions (Signed)
It was good to see you- I am sorry you do not feel well.  Please keep your appointment with Cardiology.    I want to get blood work to check your cholesterol and kidney function.  Please make a lab appointment for a morning that is convenient and do not eat anything before the blood is drawn.

## 2012-09-07 NOTE — Assessment & Plan Note (Signed)
Will check BNP given symptom, refer to cardiology for evaluation.

## 2012-09-07 NOTE — Assessment & Plan Note (Signed)
Will refer to cardiology for evaluation, possible stress test given typical exertional chest pain symptoms.

## 2012-09-07 NOTE — Progress Notes (Signed)
  Subjective:    Patient ID: [name], male    DOB: 1956/01/11, 57 y.o.   MRN: [id number]  HPI:  [name] is scheduled for a physical today, but mentions he has not been feeling well lately.    He says he cannot wake up early and it takes him nearly 2 hours to get moving in the morning.  He has chronic pain and takes MS contin and percocet, but the pain is worse recently.   He has had decreased exercise tolerance lately,  He used to swim quite a bit and walk for exercise, but lately he has felt extremely short of breath and has gotten light headed nearly every time he walks.  He says he has had some chest pressure with this.  Denies any current chest pressure.  He says it only happens when he is walking or swimming, and that it gets better after about 20 minutes of rest.    Hypertension: Taking atenolol for BP.  Says he does not take clonidine regularly, says "he can tell when it is up" and takes it.  Does not actually check blood pressures.   Past Medical History  Diagnosis Date  . Hypertension   . Bipolar disorder   . Allergy   . Depression   . Hepatitis C   . Chronic pain     History  Substance Use Topics  . Smoking status: Former Smoker -- 2.00 packs/day for 30 years    Types: Cigarettes    Quit date: 04/08/2005  . Smokeless tobacco: Never Used  . Alcohol Use: No    No family history on file.   ROS Pertinent items in HPI    Objective:  Physical Exam:  BP 131/82  Pulse 65  Temp(Src) 97.7 F (36.5 C) (Oral)  Ht 5\' 7"  (1.702 m)  Wt 176 lb (79.833 kg)  BMI 27.56 kg/m2 General appearance: alert, no distress Head: Normocephalic, without obvious abnormality, atraumatic Lungs: clear to auscultation bilaterally, no crackles Heart: regular rate and rhythm, S1, S2 normal, no murmur, click, rub or gallop Pulses: 2+ and symmetric Extrem: No edema       Assessment & Plan:

## 2012-09-15 ENCOUNTER — Emergency Department (HOSPITAL_COMMUNITY): Payer: Medicare Other

## 2012-09-15 ENCOUNTER — Other Ambulatory Visit: Payer: Medicare Other

## 2012-09-15 ENCOUNTER — Ambulatory Visit (HOSPITAL_COMMUNITY)
Admission: RE | Admit: 2012-09-15 | Discharge: 2012-09-15 | Disposition: A | Discharge status: Home / Self Care | Payer: Medicare Other | Source: Ambulatory Visit | Attending: Family Medicine | Admitting: Family Medicine

## 2012-09-15 ENCOUNTER — Ambulatory Visit (INDEPENDENT_AMBULATORY_CARE_PROVIDER_SITE_OTHER): Payer: Medicare Other | Admitting: Family Medicine

## 2012-09-15 ENCOUNTER — Inpatient Hospital Stay (HOSPITAL_COMMUNITY)
Admission: EM | Admit: 2012-09-15 | Discharge: 2012-09-16 | DRG: 311 | Disposition: A | Discharge status: Home / Self Care | Payer: Medicare Other | Attending: Family Medicine | Admitting: Family Medicine

## 2012-09-15 ENCOUNTER — Other Ambulatory Visit: Payer: Self-pay

## 2012-09-15 ENCOUNTER — Encounter (HOSPITAL_COMMUNITY): Payer: Self-pay | Admitting: *Deleted

## 2012-09-15 VITALS — BP 137/94 | HR 82 | Temp 97.9°F | Ht 67.0 in

## 2012-09-15 DIAGNOSIS — Z7982 Long term (current) use of aspirin: Secondary | ICD-10-CM

## 2012-09-15 DIAGNOSIS — R079 Chest pain, unspecified: Secondary | ICD-10-CM | POA: Diagnosis present

## 2012-09-15 DIAGNOSIS — R0600 Dyspnea, unspecified: Secondary | ICD-10-CM

## 2012-09-15 DIAGNOSIS — I200 Unstable angina: Secondary | ICD-10-CM

## 2012-09-15 DIAGNOSIS — M545 Low back pain, unspecified: Secondary | ICD-10-CM | POA: Diagnosis present

## 2012-09-15 DIAGNOSIS — G8929 Other chronic pain: Secondary | ICD-10-CM | POA: Diagnosis present

## 2012-09-15 DIAGNOSIS — F411 Generalized anxiety disorder: Secondary | ICD-10-CM | POA: Diagnosis present

## 2012-09-15 DIAGNOSIS — F419 Anxiety disorder, unspecified: Secondary | ICD-10-CM | POA: Diagnosis present

## 2012-09-15 DIAGNOSIS — G4733 Obstructive sleep apnea (adult) (pediatric): Secondary | ICD-10-CM | POA: Diagnosis present

## 2012-09-15 DIAGNOSIS — F319 Bipolar disorder, unspecified: Secondary | ICD-10-CM | POA: Diagnosis present

## 2012-09-15 DIAGNOSIS — K219 Gastro-esophageal reflux disease without esophagitis: Secondary | ICD-10-CM | POA: Diagnosis present

## 2012-09-15 DIAGNOSIS — B1920 Unspecified viral hepatitis C without hepatic coma: Secondary | ICD-10-CM | POA: Diagnosis present

## 2012-09-15 DIAGNOSIS — F339 Major depressive disorder, recurrent, unspecified: Secondary | ICD-10-CM | POA: Diagnosis present

## 2012-09-15 DIAGNOSIS — F3189 Other bipolar disorder: Secondary | ICD-10-CM | POA: Diagnosis present

## 2012-09-15 DIAGNOSIS — Z79899 Other long term (current) drug therapy: Secondary | ICD-10-CM

## 2012-09-15 DIAGNOSIS — I10 Essential (primary) hypertension: Secondary | ICD-10-CM

## 2012-09-15 DIAGNOSIS — Z87891 Personal history of nicotine dependence: Secondary | ICD-10-CM

## 2012-09-15 DIAGNOSIS — I209 Angina pectoris, unspecified: Secondary | ICD-10-CM

## 2012-09-15 DIAGNOSIS — J309 Allergic rhinitis, unspecified: Secondary | ICD-10-CM | POA: Diagnosis present

## 2012-09-15 LAB — CBC WITH DIFFERENTIAL/PLATELET
Basophils Absolute: 0 10*3/uL (ref 0.0–0.1)
Basophils Relative: 0 % (ref 0–1)
Eosinophils Absolute: 0.1 10*3/uL (ref 0.0–0.7)
Eosinophils Relative: 1 % (ref 0–5)
HCT: 43.3 % (ref 39.0–52.0)
Hemoglobin: 14.9 g/dL (ref 13.0–17.0)
Lymphocytes Relative: 36 % (ref 12–46)
Lymphs Abs: 3.3 10*3/uL (ref 0.7–4.0)
MCH: 26.5 pg (ref 26.0–34.0)
MCHC: 34.4 g/dL (ref 30.0–36.0)
MCV: 77 fL — ABNORMAL LOW (ref 78.0–100.0)
Monocytes Absolute: 0.8 10*3/uL (ref 0.1–1.0)
Monocytes Relative: 9 % (ref 3–12)
Neutro Abs: 4.9 10*3/uL (ref 1.7–7.7)
Neutrophils Relative %: 54 % (ref 43–77)
Platelets: 155 10*3/uL (ref 150–400)
RBC: 5.62 MIL/uL (ref 4.22–5.81)
RDW: 14.6 % (ref 11.5–15.5)
WBC: 9.1 10*3/uL (ref 4.0–10.5)

## 2012-09-15 LAB — TROPONIN I
Troponin I: <0.3 ng/mL (ref <0.30)
Troponin I: <0.3 ng/mL (ref <0.30)

## 2012-09-15 LAB — PRO B NATRIURETIC PEPTIDE: Pro B Natriuretic peptide (BNP): 8.4 pg/mL (ref 0–125)

## 2012-09-15 LAB — URINALYSIS, ROUTINE W REFLEX MICROSCOPIC
Bilirubin Urine: NEGATIVE
Glucose, UA: NEGATIVE mg/dL
Hgb urine dipstick: NEGATIVE
Ketones, ur: NEGATIVE mg/dL
Leukocytes, UA: NEGATIVE
Nitrite: NEGATIVE
Protein, ur: NEGATIVE mg/dL
Specific Gravity, Urine: 1.011 (ref 1.005–1.030)
Urobilinogen, UA: 0.2 mg/dL (ref 0.0–1.0)
pH: 6.5 (ref 5.0–8.0)

## 2012-09-15 LAB — BASIC METABOLIC PANEL
BUN: 18 mg/dL (ref 6–23)
CO2: 22 mEq/L (ref 19–32)
Calcium: 10 mg/dL (ref 8.4–10.5)
Chloride: 103 mEq/L (ref 96–112)
Creatinine, Ser: 0.85 mg/dL (ref 0.50–1.35)
GFR calc Af Amer: >90 mL/min (ref >90)
GFR calc non Af Amer: >90 mL/min (ref >90)
Glucose, Bld: 90 mg/dL (ref 70–99)
Potassium: 4.3 mEq/L (ref 3.5–5.1)
Sodium: 138 mEq/L (ref 135–145)

## 2012-09-15 LAB — PROTIME-INR
INR: 1.07 (ref 0.00–1.49)
Prothrombin Time: 13.8 seconds (ref 11.6–15.2)

## 2012-09-15 LAB — MAGNESIUM: Magnesium: 2.4 mg/dL (ref 1.5–2.5)

## 2012-09-15 LAB — APTT: aPTT: 31 seconds (ref 24–37)

## 2012-09-15 MED ORDER — SIMVASTATIN 20 MG PO TABS
20.0000 mg | ORAL_TABLET | Freq: Every day | ORAL | Status: DC
  Administered 2012-09-16: 20 mg via ORAL
  Filled 2012-09-15: qty 1

## 2012-09-15 MED ORDER — LORATADINE 10 MG PO TABS
10.0000 mg | ORAL_TABLET | Freq: Every morning | ORAL | Status: DC
  Administered 2012-09-16: 10 mg via ORAL
  Filled 2012-09-15: qty 1

## 2012-09-15 MED ORDER — SODIUM CHLORIDE 0.9 % IV SOLN
INTRAVENOUS | Status: DC
  Administered 2012-09-15: 500 mL via INTRAVENOUS

## 2012-09-15 MED ORDER — HEPARIN (PORCINE) IN NACL 100-0.45 UNIT/ML-% IJ SOLN
1150.0000 [IU]/h | INTRAMUSCULAR | Status: DC
  Administered 2012-09-15: 1150 [IU]/h via INTRAVENOUS
  Filled 2012-09-15 (×3): qty 250

## 2012-09-15 MED ORDER — ACETAMINOPHEN 325 MG PO TABS: 650.0000 mg | ORAL_TABLET | ORAL | Status: DC | PRN

## 2012-09-15 MED ORDER — ASPIRIN 81 MG PO CHEW: 162.0000 mg | CHEWABLE_TABLET | Freq: Once | ORAL | Status: DC

## 2012-09-15 MED ORDER — MORPHINE SULFATE ER 15 MG PO TBCR
30.0000 mg | EXTENDED_RELEASE_TABLET | Freq: Two times a day (BID) | ORAL | Status: DC
  Administered 2012-09-15 – 2012-09-16 (×2): 30 mg via ORAL
  Filled 2012-09-15 (×2): qty 2

## 2012-09-15 MED ORDER — OXYCODONE-ACETAMINOPHEN 5-325 MG PO TABS: 1.0000 | ORAL_TABLET | Freq: Two times a day (BID) | ORAL | Status: DC | PRN

## 2012-09-15 MED ORDER — ATENOLOL 50 MG PO TABS
50.0000 mg | ORAL_TABLET | Freq: Every day | ORAL | Status: DC
  Administered 2012-09-15: 50 mg via ORAL
  Filled 2012-09-15 (×2): qty 1

## 2012-09-15 MED ORDER — PANTOPRAZOLE SODIUM 40 MG PO TBEC
40.0000 mg | DELAYED_RELEASE_TABLET | Freq: Every day | ORAL | Status: DC
  Administered 2012-09-15 – 2012-09-16 (×2): 40 mg via ORAL
  Filled 2012-09-15 (×2): qty 1

## 2012-09-15 MED ORDER — SODIUM CHLORIDE 0.9 % IV SOLN
INTRAVENOUS | Status: DC
  Administered 2012-09-15 – 2012-09-16 (×2): via INTRAVENOUS

## 2012-09-15 MED ORDER — HEPARIN BOLUS VIA INFUSION
4000.0000 [IU] | Freq: Once | INTRAVENOUS | Status: AC
  Administered 2012-09-15: 4000 [IU] via INTRAVENOUS
  Filled 2012-09-15: qty 4000

## 2012-09-15 MED ORDER — ONDANSETRON HCL 4 MG/2ML IJ SOLN: 4.0000 mg | Freq: Three times a day (TID) | INTRAMUSCULAR | Status: DC | PRN

## 2012-09-15 MED ORDER — OXYCODONE HCL 5 MG PO TABS: 5.0000 mg | ORAL_TABLET | Freq: Two times a day (BID) | ORAL | Status: DC | PRN

## 2012-09-15 MED ORDER — LAMOTRIGINE 150 MG PO TABS
300.0000 mg | ORAL_TABLET | Freq: Every day | ORAL | Status: DC
Start: 2012-09-15 — End: 2012-09-16
  Administered 2012-09-15: 300 mg via ORAL
  Filled 2012-09-15 (×2): qty 2

## 2012-09-15 MED ORDER — GABAPENTIN 300 MG PO CAPS
900.0000 mg | ORAL_CAPSULE | Freq: Every day | ORAL | Status: DC
  Administered 2012-09-15: 900 mg via ORAL
  Filled 2012-09-15 (×2): qty 3

## 2012-09-15 MED ORDER — ALPRAZOLAM 0.5 MG PO TABS
0.5000 mg | ORAL_TABLET | Freq: Three times a day (TID) | ORAL | Status: DC | PRN
  Administered 2012-09-15 – 2012-09-16 (×2): 0.5 mg via ORAL
  Filled 2012-09-15 (×2): qty 1

## 2012-09-15 MED ORDER — ASPIRIN EC 81 MG PO TBEC
81.0000 mg | DELAYED_RELEASE_TABLET | Freq: Every day | ORAL | Status: DC
  Administered 2012-09-16: 81 mg via ORAL
  Filled 2012-09-15: qty 1

## 2012-09-15 MED ORDER — OXYCODONE-ACETAMINOPHEN 10-325 MG PO TABS: 1.0000 | ORAL_TABLET | Freq: Two times a day (BID) | ORAL | Status: DC | PRN

## 2012-09-15 MED ORDER — MIRTAZAPINE 45 MG PO TABS
45.0000 mg | ORAL_TABLET | Freq: Every day | ORAL | Status: DC
  Administered 2012-09-15: 45 mg via ORAL
  Filled 2012-09-15 (×2): qty 1

## 2012-09-15 MED ORDER — ONDANSETRON HCL 4 MG/2ML IJ SOLN: 4.0000 mg | Freq: Four times a day (QID) | INTRAMUSCULAR | Status: DC | PRN

## 2012-09-15 MED ORDER — NITROGLYCERIN 0.4 MG SL SUBL: 0.4000 mg | SUBLINGUAL_TABLET | SUBLINGUAL | Status: DC | PRN

## 2012-09-15 MED ORDER — REGADENOSON 0.4 MG/5ML IV SOLN
0.4000 mg | Freq: Once | INTRAVENOUS | Status: AC
  Filled 2012-09-15: qty 5

## 2012-09-15 NOTE — Progress Notes (Signed)
ANTICOAGULATION CONSULT NOTE - Initial Consult  Pharmacy Consult for Heparin                           Indication: chest pain/ACS  No Known Allergies  Patient Measurements: Height: 5\' 7"  (170.2 cm) Weight: 172 lb 9.6 oz (78.291 kg) IBW/kg (Calculated) : 66.1 Heparin Dosing Weight:   Vital Signs: Temp: 97.5 F (36.4 C) (06/10 1900) Temp src: Oral (06/10 1818) BP: 143/93 mmHg (06/10 1900) Pulse Rate: 84 (06/10 1900)  Labs:  Recent Labs  09/15/12 1651 09/15/12 1701 09/15/12 1938  HGB  --  14.9  --   HCT  --  43.3  --   PLT  --  155  --   APTT  --   --  31  LABPROT  --   --  13.8  INR  --   --  1.07  CREATININE  --  0.85  --   TROPONINI <0.30  --  <0.30    Estimated Creatinine Clearance: 91.8 ml/min (by C-G formula based on Cr of 0.85).   Medical History: Past Medical History  Diagnosis Date  . Hypertension   . Bipolar disorder   . Allergy   . Depression   . Hepatitis C   . Chronic pain     Medications:  Scheduled:  . [START ON 09/16/2012] aspirin EC  81 mg Oral Daily  . atenolol  50 mg Oral QHS  . gabapentin  900 mg Oral QHS  . heparin  4,000 Units Intravenous Once  . lamoTRIgine  300 mg Oral QHS  . [START ON 09/16/2012] loratadine  10 mg Oral q morning - 10a  . mirtazapine  45 mg Oral QHS  . morphine  30 mg Oral Q12H  . pantoprazole  40 mg Oral Daily  . [START ON 09/16/2012] simvastatin  20 mg Oral q1800    Assessment: 57 yr old male walked into the [facility] today complaining of fatigue and SOB. He also has chest pain when he walks around the block. He was transported to [facility] ED for further workup.   Goal of Therapy:  Heparin level 0.3-0.7 units/ml Monitor platelets by anticoagulation protocol: Yes   Plan:  1) Heparin bolus 4000 units 2) Heparin drip at 1150 units/hr 3) Daily heparin level and CBC while on heparin.  Eugene Garnet 09/15/2012,8:23 PM

## 2012-09-15 NOTE — H&P (Signed)
Family Medicine Teaching [facility] Admission History and Physical Service Pager: [phone]  Patient name: [name] Medical record number: [id number] Date of birth: Jan 22, 1956 Age: 57 y.o. Gender: male  Primary Care Provider: Ardyth Gal, MD  Chief Complaint: chest pain  Assessment and Plan: [name] is a 57 y.o. year old male presenting with chest pain concerning for unstable angina. PMH significant for HTN, history of many drugs used (quit several years ago), bipolar disorder/depression, and chronic pain (managed through pain clinic).   #Chest pain evaluation - concerning for possible ACS/unstable angina; CXR and troponin negative in the ED -EKG without frank ischemia but nonspecific T-wave changes in precordial leads -cardiology consulted, recommending heparin drip -ordered for aspirin 81 mg daily, sublingual nitro PRN, Zofran PRN -started Zocor 20 mg per ACS protocol [ ]  f/u serial troponins, BNP [ ]  f/u risk stratification labs (lipid panel, TSH, Hb A1c) [ ]  f/u AM EKG and labs (CBC, BMP) [ ]  f/u further cardiology recommendations (inpt vs outpt stress vs Myoview)  #HTN - intermittently elevated, as high as 131/112; more consistently 140's/80's-90's -continue home atenolol 50 mg qPM [ ]  monitor BP and consider medication adjustments inpt vs outpt  Chronic issues #Bipolar disorder/depression/anxiety - on Lamictal 300 mg qHS, Remeron 45mg  qHS, and Xanax 0.5 mg TID PRN -ordered for home regimen  #Chronic pain - on Neurontin, MS Contin 30 mg TID and Percocet 10 q12 PRN -ordered for home regimen  #GERD - ordered for Protonix (formulary substitute for home omeprazole)  #Allergies - ordered for home Claritin 10 mg daily  FEN/GI: heart-healthy diet Prophylaxis: on heparin drip per cards; on home PPI Disposition: Admit to tele bed, attending Dr. Deirdre Priest (Dr. Lum Babe during the day this week)  -management as above, with discharge pending resolution of  symptoms/further work-up Code Status: Full  History of Present Illness: [name] is a 57 y.o. year old male presenting with chest pain since this morning. PMH significant for HTN, history of many drugs used (quit several years ago), bipolar disorder/depression, and chronic pain from a back injury as a teenager (managed through pain clinic). Pt reports sudden onset of sharp, left-sided chest pain while walking today; pt initially came into PCP's office earlier today and was referred to the ED (see Dr. Algis Downs outpt note from earlier today). The sharp pain lasted minutes (unclear from description if it radiated to shoulder/arm/neck) and was associated with tightness in his chest, shortness of breath, dizziness, nausea, and headache which persisted even after resting and taking 4 aspirin. Pt has a history of "panic attacks" and this pain was not relieved with Xanax. Pt reports relief of chest pain itself since coming to his doctor's office/the ED and "feels much better" but still has some vague chest "discomfort." Otherwise denies abdominal pain, frank vomiting, change in bowel/bladder habits, sick contacts, rash or other skin changes, cough/coryza-type symptoms, fevers/chills, or weight loss.  Of note, pt was seen in the ED in January for chest pain but was sent home. He reports intermittent shortness of breath and anginal-type chest pain occasionally over the last six months and describes a specific episode of exertional chest pain about 3 days ago while cleaning off leaves/branches from his roof; description of that pain/chest discomfort is similar to today's. Pt already had an outpt cardiology appointment in the coming weeks. Pt stated he has never seen a cardiologist before but apparently has had a cath several years ago. Pt also stated to Dr. Mikel Cella in clinic that is not a smoker  but stated in the ED here that he quit many years ago and heavily smoked before that.   Patient Active Problem List    Diagnosis Date Noted  . Unstable angina 09/15/2012  . Exertional chest pain 09/07/2012  . Dyspnea on exertion 09/07/2012  . Memory impairment 12/25/2011  . Sore throat 12/25/2011  . 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 Acute sinusitis 11/06/2011  . Dyspnea 10/17/2011  . Screening for prostate cancer 04/24/2011  . Constipation 04/24/2011  . GERD (gastroesophageal reflux disease) 12/18/2010  . Skin lesion of face 07/30/2010  . OBSTRUCTIVE SLEEP APNEA 09/20/2009  . HYPERTENSION, BENIGN 10/19/2008  . VERTIGO 04/05/2008  . RHINITIS 05/13/2007  . HEPATITIS C 06/05/2006  . DEPRESSION, MAJOR, RECURRENT 06/05/2006  . BIPOLAR DISORDER 06/05/2006  . ANXIETY 06/05/2006  . ALCOHOL ABUSE, UNSPECIFIED 06/05/2006  . BACK PAIN, LOW 06/05/2006  . APNEA, SLEEP 06/05/2006   Past Medical History: Past Medical History  Diagnosis Date  . Hypertension   . Bipolar disorder   . Allergy   . Depression   . Hepatitis C   . Chronic pain    Past Surgical History: Past Surgical History  Procedure Laterality Date  . Tonsillectomy     Social History: History  Substance Use Topics  . Smoking status: Former Smoker -- 2.00 packs/day for 30 years    Types: Cigarettes    Quit date: 04/08/2005  . Smokeless tobacco: Never Used  . Alcohol Use: No   For any additional social history documentation, please refer to relevant sections of EMR.  Family History: No family history on file. Allergies: No Known Allergies Current Facility-Administered Medications on File Prior to Encounter  Medication Dose Route Frequency Provider Last Rate Last Dose  . 0.9 %  sodium chloride infusion   Intravenous Continuous Hilarie Fredrickson, MD 20 mL/hr at 09/15/12 1635 500 mL at 09/15/12 1635   Current Outpatient Prescriptions on File Prior to Encounter  Medication Sig Dispense Refill  . ALPRAZolam (XANAX) 1 MG tablet Take 0.5 mg by mouth 3 (three) times daily as needed. For anxiety      . aspirin EC 81 MG tablet Take 81 mg  by mouth every morning.      . fluticasone (FLONASE) 50 MCG/ACT nasal spray Place 2 sprays into the nose daily.  16 g  3  . gabapentin (NEURONTIN) 300 MG capsule Take 900 mg by mouth at bedtime.       Marland Kitchen glucosamine-chondroitin 500-400 MG tablet Take 1 tablet by mouth every morning.      . lamoTRIgine (LAMICTAL) 100 MG tablet Take 300 mg by mouth at bedtime.       Marland Kitchen loratadine (CLARITIN) 10 MG tablet Take 10 mg by mouth every morning.       . mirtazapine (REMERON) 30 MG tablet Take 45 mg by mouth at bedtime.       Marland Kitchen morphine (MS CONTIN) 30 MG 12 hr tablet Take 30 mg by mouth 3 (three) times daily.       . Multiple Vitamin (MULTIVITAMIN WITH MINERALS) TABS Take 1 tablet by mouth every morning.      Marland Kitchen oxyCODONE-acetaminophen (PERCOCET) 10-325 MG per tablet Take 1 tablet by mouth every 12 (twelve) hours.       Marland Kitchen tiotropium (SPIRIVA) 18 MCG inhalation capsule Place 18 mcg into inhaler and inhale daily as needed. For shortness of breath       Review Of Systems: Per HPI. Otherwise 12 point review of systems was performed and was unremarkable.  Physical Exam: BP 131/112  Pulse 88  Temp(Src) 97.6 F (36.4 C) (Oral)  Resp 21  SpO2 97% Exam: General: adult male in NAD, lying in bed HEENT: [place]/AT, EOMI, PERRLA, MMM  No cervical lymphadenopathy Cardiovascular: RRR, no murmur appreciated Respiratory: CTAB, no wheezes, comfortable WOB Abdomen: soft, nondistended, BS+ Extremities: warm, well-perfused, no LE edema Skin: warm, dry, intact Neuro: alert/oriented x4  Labs and Imaging: CBC BMET   Recent Labs Lab 09/15/12 1701  WBC 9.1  HGB 14.9  HCT 43.3  PLT 155    Recent Labs Lab 09/15/12 1701  NA 138  K 4.3  CL 103  CO2 22  BUN 18  CREATININE 0.85  GLUCOSE 90  CALCIUM 10.0      Recent Labs Lab 09/15/12 1651 09/15/12 1938  TROPONINI <0.30 <0.30    Recent Labs Lab 09/15/12 1938  INR 1.07  APTT 31   Urinalysis   09/15/2012 17:10  Color, Urine YELLOW  APPearance CLEAR   Specific Gravity, Urine 1.011  pH 6.5  Glucose NEGATIVE  Bilirubin Urine NEGATIVE  Ketones, ur NEGATIVE  Protein NEGATIVE  Urobilinogen, UA 0.2  Nitrite NEGATIVE  Leukocytes, UA NEGATIVE  Hgb urine dipstick NEGATIVE    CXR, 6/10 @1636  Findings: Cardiomediastinal silhouette is stable. No acute  infiltrate or pleural effusion. No pulmonary edema.  IMPRESSION:  No active disease.  Gamal Todisco, [place], MD 09/15/2012, 6:23 PM

## 2012-09-15 NOTE — Patient Instructions (Addendum)
You will be transported to the [facility] Emergency room for further evaluation of your chest pain. If they feel it is appropriate, you may be admitted to the hospital for further observation.  Franci Oshana M. Laelynn Blizzard, M.D.

## 2012-09-15 NOTE — Assessment & Plan Note (Signed)
Given his age, risk factors including known unstable angina and HTN as well as story, will transport to the ED for further work-up. Patient is aware of this plan and agrees. IV access started in clinic. Report given to [facility] who will transport directly to [facility] ED.

## 2012-09-15 NOTE — ED Notes (Signed)
Consulting MD at bedside

## 2012-09-15 NOTE — ED Notes (Signed)
EKG at family practice showed t wave inversion.

## 2012-09-15 NOTE — Progress Notes (Signed)
Patient ID: [name], male   DOB: [birth year], 57 y.o.   MRN: [id number]  [facility] Abigail Teall M. Tempie Gibeault, MD Phone: [phone]   Subjective: HPI: Patient is a 57 y.o. male walked into clinic today for fatigue and SOB.  Had injured back in January 2014. Since then he has been slowly increasing activity. Patient states that for the last several months he has had increased fatigue. Whenever he walks around the block, he has chest tightness and SOB for the last few weeks. Typically he can stop and take an aspirin and the pain goes away. This morning he walked around the block and had the pain in his chest but this was a different type of pain that he describes as a sharp pain. He stopped walking, took Aspirin (total of four 81mg ) and Xanax (1mg ) and continued to not feeling well so he came to clinic for evaluation.  Sharp pain on right side of chest, does not radiate. States he started to feel better soon after he got to clinic. Also had a tightness in his chest. Endorses SOB, nausea and lightheadedness. Never had an episode like this particular one. Never smoker, has HTN, no DM, no high cholesterol, no family history of heart disease. Does have an appointment with Cardiology within the next month.   History Reviewed: Never smoker.  ROS: Please see HPI above.  Objective: Office vital signs reviewed. BP 137/94  Pulse 82  Temp(Src) 97.9 F (36.6 C) (Oral)  Ht 5\' 7"  (1.702 m)  Physical Examination:  General: Awake, alert. Pale appearing. Not diaphoretic. HEENT: Atraumatic, normocephalic. MMM Pulm: CTAB, no wheezes Cardio: RRR, no murmurs appreciated. No chest tenderness Extremities: No edema Neuro: Grossly intact  EKG: Rate 75, normal intervals. Slight right axis deviation. No ST elevation, but flipped T waves in V1-V3 (previously upright in V2-V3) and flattened T in lead III.  Assessment: 57 y.o. male with chest discomfort and unstable angina  Plan: Given his  age, risk factors including known unstable angina and HTN as well as story, will transport to the ED for further work-up. Patient is aware of this plan and agrees. IV access started in clinic. Report given to [facility] who will transport directly to [facility] ED.

## 2012-09-15 NOTE — Progress Notes (Signed)
22 G IV placed in Rt. hand.  Site prepped with CHG.  One attempt with good blood return.  Clear dressing applied.  Patient tolerated well and site unremarkable.  Lot # [id number]; Exp. 09/2013.  Gaylene Brooks, RN

## 2012-09-15 NOTE — ED Provider Notes (Signed)
History     CSN: [id number]  Arrival date & time 09/15/12  1532   First MD Initiated Contact with Patient 09/15/12 1544      Chief Complaint  Patient presents with  . Chest Pain    (Consider location/radiation/quality/duration/timing/severity/associated sxs/prior treatment) HPI Comments:  Pt is a 57 yo M with history of HTN, 30 pack year smoking history, and panic disorder presenting with chest pain. For the past few weeks, he has been feeling increasingly "weak" and developping chest pain and shortness of breath when walking around the block and walking around his house. He usually takes an asprin and xanax when this occurs, and the chest pain resolves. Today, he developed pain that is worse than his previous pain and did not resolve after taking asprin and xanax; he was seen by his PCP, who was concerned about chest pain and sent him to the ED. The pain is a sharp pain in his right upper chest, does not radiate, and is associated with nausea, a "hot" feeling, shortness of breath, and dizziness. He says the pain could be due to a panic attack, but feels more severe than his previous pain. Of note, he was seen in the [place] ED for this pain a few weeks ago, where he says he was diagnosed with anxiety. He denies fever, abdominal pain, long car trips, or previous clots in his legs. He has a history of HTN, 30 pack years of smoking, and previous substance use of "everything." He is unsure if any family members have cardiac history.   Patient is a 57 y.o. male presenting with chest pain. The history is provided by the patient.  Chest Pain Associated symptoms: shortness of breath   Associated symptoms: no abdominal pain, no cough, no dizziness, no fever and no headache     Past Medical History  Diagnosis Date  . Hypertension   . Bipolar disorder   . Allergy   . Depression   . Hepatitis C   . Chronic pain     Past Surgical History  Procedure Laterality Date  . Tonsillectomy       No family history on file.  History  Substance Use Topics  . Smoking status: Former Smoker -- 2.00 packs/day for 30 years    Types: Cigarettes    Quit date: 04/08/2005  . Smokeless tobacco: Never Used  . Alcohol Use: No      Review of Systems  Constitutional: Negative for fever, chills, activity change and appetite change.  HENT: Negative for neck pain.   Eyes: Negative for visual disturbance.  Respiratory: Positive for shortness of breath. Negative for cough and chest tightness.   Cardiovascular: Positive for chest pain.  Gastrointestinal: Negative for abdominal pain and abdominal distention.  Genitourinary: Negative for dysuria, enuresis and difficulty urinating.  Musculoskeletal: Negative for arthralgias.  Neurological: Negative for dizziness, light-headedness and headaches.  Psychiatric/Behavioral: Negative for confusion.    Allergies  Review of patient's allergies indicates no known allergies.  Home Medications   Current Outpatient Rx  Name  Route  Sig  Dispense  Refill  . ALPRAZolam (XANAX) 1 MG tablet   Oral   Take 0.5 mg by mouth 3 (three) times daily as needed. For anxiety         . aspirin EC 81 MG tablet   Oral   Take 81 mg by mouth every morning.         Marland Kitchen atenolol (TENORMIN) 50 MG tablet   Oral   Take  50 mg by mouth every evening.         . fluticasone (FLONASE) 50 MCG/ACT nasal spray   Nasal   Place 2 sprays into the nose daily.   16 g   3   . gabapentin (NEURONTIN) 300 MG capsule   Oral   Take 900 mg by mouth at bedtime.          Marland Kitchen glucosamine-chondroitin 500-400 MG tablet   Oral   Take 1 tablet by mouth every morning.         . lamoTRIgine (LAMICTAL) 100 MG tablet   Oral   Take 300 mg by mouth at bedtime.          Marland Kitchen loratadine (CLARITIN) 10 MG tablet   Oral   Take 10 mg by mouth every morning.          . mirtazapine (REMERON) 30 MG tablet   Oral   Take 45 mg by mouth at bedtime.          Marland Kitchen morphine (MS  CONTIN) 30 MG 12 hr tablet   Oral   Take 30 mg by mouth 3 (three) times daily.          . Multiple Vitamin (MULTIVITAMIN WITH MINERALS) TABS   Oral   Take 1 tablet by mouth every morning.         Marland Kitchen omeprazole (PRILOSEC) 20 MG capsule   Oral   Take 20 mg by mouth every evening.         Marland Kitchen oxyCODONE-acetaminophen (PERCOCET) 10-325 MG per tablet   Oral   Take 1 tablet by mouth every 12 (twelve) hours.          Marland Kitchen tiotropium (SPIRIVA) 18 MCG inhalation capsule   Inhalation   Place 18 mcg into inhaler and inhale daily as needed. For shortness of breath         . vitamin C (ASCORBIC ACID) 500 MG tablet   Oral   Take 500 mg by mouth daily.           BP 139/86  Pulse 77  Temp(Src) 98.2 F (36.8 C) (Oral)  Resp 12  SpO2 96%  Physical Exam  Nursing note and vitals reviewed. Constitutional: He is oriented to person, place, and time. He appears well-developed.  HENT:  Head: Normocephalic and atraumatic.  Eyes: Conjunctivae and EOM are normal. Pupils are equal, round, and reactive to light.  Neck: Normal range of motion. Neck supple.  Cardiovascular: Normal rate and regular rhythm.   Pulmonary/Chest: Effort normal and breath sounds normal.  Abdominal: Soft. Bowel sounds are normal. He exhibits no distension. There is no tenderness. There is no rebound and no guarding.  Neurological: He is alert and oriented to person, place, and time.  Skin: Skin is warm.    ED Course  Procedures (including critical care time)  Labs Reviewed  CBC WITH DIFFERENTIAL  TROPONIN I  URINALYSIS, ROUTINE W REFLEX MICROSCOPIC  BASIC METABOLIC PANEL   Dg Chest Port 1 View  09/15/2012   *RADIOLOGY REPORT*  Clinical Data: Left chest pain  PORTABLE CHEST - 1 VIEW  Comparison: 05/19/2003  Findings: Cardiomediastinal silhouette is stable.  No acute infiltrate or pleural effusion.  No pulmonary edema.  IMPRESSION: No active disease.   Original Report Authenticated By: Natasha Mead, M.D.     No  diagnosis found.    MDM   Date: 09/15/2012  Rate: 75  Rhythm: normal sinus rhythm  QRS Axis: normal  Intervals: normal  ST/T Wave abnormalities: normal  Conduction Disutrbances: none  Narrative Interpretation: unremarkable  Differential diagnosis includes: ACS syndrome CHF exacerbation Valvular disorder Myocarditis Pericarditis Pericardial effusion Pneumonia Pleural effusion Pulmonary edema PE Anemia Musculoskeletal pain   Pt comes in with cc of chest pain. Pt's risk factors are age, gender, smoking and HTN. His chest pain is mostly atypical - with few features - exertional dib, chest pain that might be perceived as concerning. EKG shows new t wave inversions in the anterior leads. Good candidate for stress, ACS rule out.    Derwood Kaplan, MD 09/15/12 [phone]

## 2012-09-15 NOTE — Progress Notes (Signed)
BMP,FLP,PBNP AND ESR DONE TODAY [name]

## 2012-09-15 NOTE — ED Notes (Signed)
Dr. Rhunette Croft states that pt may take his own percocet.

## 2012-09-15 NOTE — ED Notes (Signed)
Per [facility]- pt was seen at family practice. Pt received 324mg  of asprin. Pt had rt sided chest pain that started this morning around 11am. Pain is worse with exertion. Pt has SOB and nausea.

## 2012-09-15 NOTE — Addendum Note (Signed)
Addended by: Altamese Dilling A on: 09/15/2012 04:51 PM   Modules accepted: Orders

## 2012-09-15 NOTE — Consult Note (Addendum)
CARDIOLOGY CONSULT NOTE   Patient ID: [name] MRN: [id number] DOB/AGE: [birth year] 57 y.o.  Admit date: 09/15/2012  Primary Physician   Ardyth Gal, MD Primary Cardiologist   New (Dr Allyson Sabal cathed in 2003) Reason for Consultation   Chest pain, abnl ECG  NWG:NFAO Coffield is a 57 y.o. male with no history of CAD.  He had chest pain in 2003 but a cardiac catheterization was clean at that time. He was admitted today with chest pain and his ECG was abnormal so cardiology was asked to evaluate him.  He hurt his back in January and his activity level has been significantly decreased for 3 months. Recently, he has been trying to increase his activity but has been very tired. He has been SOB with exertion. He has had chest tightness with exertion for about a month. When he exerts himself, he will get SOB first, then the chest tightness. The symptoms will resolve with rest. He has not had resting symptoms. The DOE has not improved despite trying to increase his activity. The chest pain he had today was sharp and different from his usual pain. It came and went while walking. He was feeling dizzy and like he was going to pass out. These symptoms lasted for several hours. Symptoms began around 10 am and resolved by about 2 pm. Has never had sharp pain like that before.   Past Medical History  Diagnosis Date  . Hypertension   . Bipolar disorder   . Allergy   . Depression   . Hepatitis C   . Chronic pain      Past Surgical History  Procedure Laterality Date  . Tonsillectomy    . Cardiac catheterization  2003    No CAD    No Known Allergies  I have reviewed the patient's current medications . [START ON 09/16/2012] aspirin EC  81 mg Oral Daily  . atenolol  50 mg Oral QHS  . gabapentin  900 mg Oral QHS  . heparin  4,000 Units Intravenous Once  . lamoTRIgine  300 mg Oral QHS  . [START ON 09/16/2012] loratadine  10 mg Oral q morning - 10a  . mirtazapine  45 mg Oral QHS  . morphine  30  mg Oral Q12H  . pantoprazole  40 mg Oral Daily  . [START ON 09/16/2012] simvastatin  20 mg Oral q1800   . sodium chloride    . heparin     acetaminophen, ALPRAZolam, nitroGLYCERIN, ondansetron (ZOFRAN) IV, oxyCODONE, oxyCODONE-acetaminophen  Prior to Admission medications   Medication Sig Start Date End Date Taking? Authorizing Provider  ALPRAZolam Prudy Feeler) 1 MG tablet Take 0.5 mg by mouth 3 (three) times daily as needed. For anxiety 10/29/11  Yes Historical Provider, MD  aspirin EC 81 MG tablet Take 81 mg by mouth every morning.   Yes Historical Provider, MD  atenolol (TENORMIN) 50 MG tablet Take 50 mg by mouth every evening.   Yes Historical Provider, MD  fluticasone (FLONASE) 50 MCG/ACT nasal spray Place 2 sprays into the nose daily. 09/06/11  Yes Judi Saa, DO  gabapentin (NEURONTIN) 300 MG capsule Take 900 mg by mouth at bedtime.  08/24/12  Yes Historical Provider, MD  glucosamine-chondroitin 500-400 MG tablet Take 1 tablet by mouth every morning.   Yes Historical Provider, MD  lamoTRIgine (LAMICTAL) 100 MG tablet Take 300 mg by mouth at bedtime.    Yes Historical Provider, MD  loratadine (CLARITIN) 10 MG tablet Take 10 mg by mouth every morning.  Yes Historical Provider, MD  mirtazapine (REMERON) 30 MG tablet Take 45 mg by mouth at bedtime.    Yes Historical Provider, MD  morphine (MS CONTIN) 30 MG 12 hr tablet Take 30 mg by mouth 3 (three) times daily.  08/11/12  Yes Historical Provider, MD  Multiple Vitamin (MULTIVITAMIN WITH MINERALS) TABS Take 1 tablet by mouth every morning.   Yes Historical Provider, MD  omeprazole (PRILOSEC) 20 MG capsule Take 20 mg by mouth every evening.   Yes Historical Provider, MD  oxyCODONE-acetaminophen (PERCOCET) 10-325 MG per tablet Take 1 tablet by mouth every 12 (twelve) hours.  09/03/12  Yes Historical Provider, MD  tiotropium (SPIRIVA) 18 MCG inhalation capsule Place 18 mcg into inhaler and inhale daily as needed. For shortness of breath 10/22/11  10/21/12 Yes Sanjuana Letters, MD  vitamin C (ASCORBIC ACID) 500 MG tablet Take 500 mg by mouth daily.   Yes Historical Provider, MD     History   Social History  . Marital Status: Divorced    Spouse Name: N/A    Number of Children: N/A  . Years of Education: N/A   Occupational History  . DISABLED   .      Social History Main Topics  . Smoking status: Former Smoker -- 2.00 packs/day for 30 years    Types: Cigarettes    Quit date: 04/08/2005  . Smokeless tobacco: Never Used  . Alcohol Use: No  . Drug Use: No     Comment: Use to take cocaine, marijuana, per patient everything. Stopped in 1987.  Marland Kitchen Sexually Active: Not Currently -- Male partner(s)   Other Topics Concern  . Not on file   Social History Narrative   Lives alone. Family history is unclear, his father may have had CAD/MI. His mother and siblings have no known CAD.    ROS:  Full 14 point review of systems complete and found to be negative unless listed above.  Physical Exam: Blood pressure 143/93, pulse 84, temperature 97.5 F (36.4 C), temperature source Oral, resp. rate 18, height 5\' 7"  (1.702 m), weight 172 lb 9.6 oz (78.291 kg), SpO2 94.00%.  General: Well developed, well nourished, male in no acute distress Head: Eyes PERRLA, No xanthomas.   Normocephalic and atraumatic, oropharynx without edema or exudate. Dentition: good Lungs: CTA bilaterally Heart: HRRR S1 S2, no rub/gallop, soft systolic murmur. pulses are 2+ all 4 extrem.   Neck: No carotid bruits. No lymphadenopathy.  JVD not elevated. Abdomen: Bowel sounds present, abdomen soft and non-tender without masses or hernias noted. Msk:  No spine or cva tenderness. No weakness, no joint deformities or effusions. Extremities: No clubbing or cyanosis. no edema.  Neuro: Alert and oriented X 3. No focal deficits noted. Psych:  Good affect, responds appropriately Skin: No rashes or lesions noted.  Labs:   Lab Results  Component Value Date   WBC 9.1  09/15/2012   HGB 14.9 09/15/2012   HCT 43.3 09/15/2012   MCV 77.0* 09/15/2012   PLT 155 09/15/2012    Recent Labs  09/15/12 1938  INR 1.07     Recent Labs Lab 09/15/12 1701  NA 138  K 4.3  CL 103  CO2 22  BUN 18  CREATININE 0.85  CALCIUM 10.0  GLUCOSE 90    Recent Labs  09/15/12 1651  TROPONINI <0.30    ECG: 15-Sep-2012 15:44:58 [name] SINUS RHYTHM ~ normal P axis, V-rate 50- 99 LEFT POSTERIOR FASCICULAR BLOCK ~ trm axis(110,210),  init force sup LATE PRECORDIAL R/S TRANSITION ~ QRS area negative in V5/V6 Standard 12 Lead Report ~ Unconfirmed Interpretation Abnormal ECG 41mm/s 42mm/mV 150Hz  8.0.1 12SL 235 CID: [id number] Referred by: Unconfirmed Vent. rate 73 BPM PR interval 164 ms QRS duration 100 ms QT/QTc 408/450 ms P-R-T axes 25 137 42  Radiology:  Dg Chest Port 1 View 09/15/2012   *RADIOLOGY REPORT*  Clinical Data: Left chest pain  PORTABLE CHEST - 1 VIEW  Comparison: 05/19/2003  Findings: Cardiomediastinal silhouette is stable.  No acute infiltrate or pleural effusion.  No pulmonary edema.  IMPRESSION: No active disease.   Original Report Authenticated By: Natasha Mead, M.D.    ASSESSMENT AND PLAN:   The patient was seen today by Dr Elease Hashimoto, the patient evaluated and the data reviewed.  Principal Problem:   Exertional chest pain - Symptoms have typical and atypical features. He has a history of chest pain in the past, but cath at that time was clean. Advised pt he would need a cath if enzymes become elevated.    Otherwise, per primary MD. Active Problems:   DEPRESSION, MAJOR, RECURRENT   ANXIETY   HYPERTENSION, BENIGN   BACK PAIN, LOW   Signed: Theodore Demark, PA-C 09/15/2012 8:17 PM Beeper [phone]  Co-Sign MD  Attending Note:   The patient was seen and examined.  Agree with assessment and plan as noted above.  Changes made to the above note as needed.  Pt has a hx of normal cors about 11 years ago. He has a hx of  HTN.  He now presents with exertional dyspnea and also had some CP today.  His ECG shows TWI in V1-V3 ( new) which may be due to HTN and subsequent LVH or may be due to ischemia.  Troponin is negative so far.  If troponin stays negative, will do a Lexiscan myoview.  If his enzymes are + , will do a cardiac cath.  Will order the Lexiscan and the [facility] and will make him NPO after midnight.  If his enzymes are +, will cancel the [facility] and schedule a cath.   Vesta Mixer, Montez Hageman., MD, [facility] 09/15/2012, 9:01 PM

## 2012-09-16 ENCOUNTER — Inpatient Hospital Stay (HOSPITAL_COMMUNITY): Payer: Medicare Other

## 2012-09-16 ENCOUNTER — Other Ambulatory Visit: Payer: Self-pay

## 2012-09-16 DIAGNOSIS — F339 Major depressive disorder, recurrent, unspecified: Secondary | ICD-10-CM

## 2012-09-16 DIAGNOSIS — F411 Generalized anxiety disorder: Secondary | ICD-10-CM

## 2012-09-16 DIAGNOSIS — K219 Gastro-esophageal reflux disease without esophagitis: Secondary | ICD-10-CM

## 2012-09-16 DIAGNOSIS — M545 Low back pain, unspecified: Secondary | ICD-10-CM

## 2012-09-16 DIAGNOSIS — F319 Bipolar disorder, unspecified: Secondary | ICD-10-CM

## 2012-09-16 DIAGNOSIS — R079 Chest pain, unspecified: Secondary | ICD-10-CM

## 2012-09-16 DIAGNOSIS — I200 Unstable angina: Principal | ICD-10-CM

## 2012-09-16 DIAGNOSIS — R0609 Other forms of dyspnea: Secondary | ICD-10-CM

## 2012-09-16 DIAGNOSIS — I10 Essential (primary) hypertension: Secondary | ICD-10-CM

## 2012-09-16 LAB — LIPID PANEL
Cholesterol: 149 mg/dL (ref 0–200)
Cholesterol: 145 mg/dL (ref 0–200)
HDL: 44 mg/dL (ref >39)
HDL: 42 mg/dL (ref >39)
LDL Cholesterol: 88 mg/dL (ref 0–99)
LDL Cholesterol: 70 mg/dL (ref 0–99)
Total CHOL/HDL Ratio: 3.4 Ratio
Total CHOL/HDL Ratio: 3.5 RATIO
Triglycerides: 85 mg/dL (ref <150)
Triglycerides: 166 mg/dL — ABNORMAL HIGH (ref <150)
VLDL: 17 mg/dL (ref 0–40)
VLDL: 33 mg/dL (ref 0–40)

## 2012-09-16 LAB — BASIC METABOLIC PANEL WITH GFR
BUN: 15 mg/dL (ref 6–23)
CO2: 28 meq/L (ref 19–32)
Calcium: 8.8 mg/dL (ref 8.4–10.5)
Chloride: 107 meq/L (ref 96–112)
Creatinine, Ser: 0.93 mg/dL (ref 0.50–1.35)
GFR calc Af Amer: >90 mL/min
GFR calc non Af Amer: >90 mL/min
Glucose, Bld: 104 mg/dL — ABNORMAL HIGH (ref 70–99)
Potassium: 4.4 meq/L (ref 3.5–5.1)
Sodium: 143 meq/L (ref 135–145)

## 2012-09-16 LAB — SEDIMENTATION RATE: Sed Rate: 4 mm/hr (ref 0–16)

## 2012-09-16 LAB — BASIC METABOLIC PANEL
BUN: 19 mg/dL (ref 6–23)
CO2: 26 mEq/L (ref 19–32)
Calcium: 9.4 mg/dL (ref 8.4–10.5)
Chloride: 102 mEq/L (ref 96–112)
Creat: 0.99 mg/dL (ref 0.50–1.35)
Glucose, Bld: 97 mg/dL (ref 70–99)
Potassium: 4.3 mEq/L (ref 3.5–5.3)
Sodium: 137 mEq/L (ref 135–145)

## 2012-09-16 LAB — CBC
HCT: 39.3 % (ref 39.0–52.0)
Hemoglobin: 12.9 g/dL — ABNORMAL LOW (ref 13.0–17.0)
MCH: 25.7 pg — ABNORMAL LOW (ref 26.0–34.0)
MCHC: 32.8 g/dL (ref 30.0–36.0)
MCV: 78.4 fL (ref 78.0–100.0)
Platelets: 194 K/uL (ref 150–400)
RBC: 5.01 MIL/uL (ref 4.22–5.81)
RDW: 14.8 % (ref 11.5–15.5)
WBC: 7.8 K/uL (ref 4.0–10.5)

## 2012-09-16 LAB — TSH: TSH: 0.977 u[IU]/mL (ref 0.350–4.500)

## 2012-09-16 LAB — RAPID URINE DRUG SCREEN, HOSP PERFORMED
Amphetamines: NOT DETECTED
Barbiturates: NOT DETECTED
Benzodiazepines: NOT DETECTED
Cocaine: NOT DETECTED
Opiates: POSITIVE — AB
Tetrahydrocannabinol: NOT DETECTED

## 2012-09-16 LAB — PRO B NATRIURETIC PEPTIDE: Pro B Natriuretic peptide (BNP): 5 pg/mL (ref <126)

## 2012-09-16 LAB — TROPONIN I
Troponin I: <0.3 ng/mL (ref <0.30)
Troponin I: <0.3 ng/mL (ref <0.30)

## 2012-09-16 LAB — HEMOGLOBIN A1C
Hgb A1c MFr Bld: 5.5 % (ref <5.7)
Mean Plasma Glucose: 111 mg/dL (ref <117)

## 2012-09-16 LAB — HEPARIN LEVEL (UNFRACTIONATED)
Heparin Unfractionated: 0.51 [IU]/mL (ref 0.30–0.70)
Heparin Unfractionated: 0.36 IU/mL (ref 0.30–0.70)

## 2012-09-16 MED ORDER — SIMVASTATIN 20 MG PO TABS: 20.0000 mg | ORAL_TABLET | Freq: Every day | ORAL | Status: DC

## 2012-09-16 MED ORDER — TECHNETIUM TC 99M SESTAMIBI GENERIC - CARDIOLITE
30.0000 | Freq: Once | INTRAVENOUS | Status: AC | PRN
  Administered 2012-09-16: 30 via INTRAVENOUS

## 2012-09-16 MED ORDER — REGADENOSON 0.4 MG/5ML IV SOLN
INTRAVENOUS | Status: AC
  Administered 2012-09-16: 0.4 mg via INTRAVENOUS
  Filled 2012-09-16: qty 5

## 2012-09-16 MED ORDER — TECHNETIUM TC 99M SESTAMIBI GENERIC - CARDIOLITE
10.0000 | Freq: Once | INTRAVENOUS | Status: AC | PRN
  Administered 2012-09-16: 10 via INTRAVENOUS

## 2012-09-16 NOTE — Discharge Summary (Signed)
FMTS Attending Admission Note: Nicolette Bang Pager [phone] I  have reviewed patient's chart and result. I have discussed this patient with the resident. I agree with the resident's findings, assessment and care plan.

## 2012-09-16 NOTE — Progress Notes (Signed)
FMTS Attending Admission Note: Kehinde Eniola,MD Pager [phone] I  have seen and examined this patient, reviewed their chart. I have discussed this patient with the resident. I agree with the resident's findings, assessment and care plan.  

## 2012-09-16 NOTE — Progress Notes (Signed)
ANTICOAGULATION CONSULT NOTE - Follow Up Consult  Pharmacy Consult for Heparin Indication: chest pain/ACS  No Known Allergies  Patient Measurements: Height: 5\' 7"  (170.2 cm) Weight: 191 lb 9.6 oz (86.909 kg) IBW/kg (Calculated) : 66.1 Heparin Dosing Weight:    Vital Signs: Temp: 97.6 F (36.4 C) (06/11 1358) Temp src: Oral (06/11 1358) BP: 127/81 mmHg (06/11 1358) Pulse Rate: 77 (06/11 1358)  Labs:  Recent Labs  09/15/12 1331  09/15/12 1651 09/15/12 1701 09/15/12 1938 09/16/12 0130 09/16/12 0513 09/16/12 0740 09/16/12 1340  HGB  --   --   --  14.9  --   --  12.9*  --   --   HCT  --   --   --  43.3  --   --  39.3  --   --   PLT  --   --   --  155  --   --  194  --   --   APTT  --   --   --   --  31  --   --   --   --   LABPROT  --   --   --   --  13.8  --   --   --   --   INR  --   --   --   --  1.07  --   --   --   --   HEPARINUNFRC  --   --   --   --   --   --  0.51  --  0.36  CREATININE 0.99  --   --  0.85  --   --  0.93  --   --   TROPONINI  --   < > <0.30  --  <0.30 <0.30  --  <0.30  --   < > = values in this interval not displayed.  Estimated Creatinine Clearance: 94.4 ml/min (by C-G formula based on Cr of 0.93).  Assessment: 57 yr old male walked into the [facility] complaining of fatigue, SOB and CP.He was transported to [facility] ED for a cardiac workup. He is being started on heparin per pharmacy to r/o MI.  Anticoag: CP on heparin. HL 0.51 and 0.36 both in goal range.  Cards: HTN, EKG unchanged, Troponins negative x 3, Stress myoview today= normal per RN Meds: ASA 81mg , Atenolol, Zocor  Endo: A1c 5.5. TSH 0.977  GI/Nutrition: GERD on PPI  Neuro: Bipolar disorder, depression, chronic pain Meds: Neurontin, Lamictal, Remeron, MS Contin,   Goal of Therapy:  Heparin level 0.3-0.7 units/ml Monitor platelets by anticoagulation protocol: Yes   Plan:  Heparin 1150 ut/shr Potential d/c home.  Aeriel Boulay S. Merilynn Finland, PharmD, BCPS Clinical Staff  Pharmacist Pager [phone]  Misty Stanley Stillinger 09/16/2012,2:36 PM

## 2012-09-16 NOTE — Progress Notes (Signed)
Utilization review completed.  

## 2012-09-16 NOTE — Progress Notes (Signed)
PROGRESS NOTE  Subjective:   [name] is a 57 yo with hx of CP in the past.  He has had a normal cath by Dr. Allyson Sabal in 2003.  He presents with exertional dyspnea and some CP.  Cardiac enzymes are negative.    Objective:    Vital Signs:   Temp:  [97.5 F (36.4 C)-98.4 F (36.9 C)] 98.1 F (36.7 C) (06/11 0526) Pulse Rate:  [61-95] 95 (06/11 0526) Resp:  [11-21] 18 (06/11 0526) BP: (109-143)/(65-112) 113/74 mmHg (06/11 0526) SpO2:  [94 %-100 %] 99 % (06/11 0526) Weight:  [169 lb (76.658 kg)-191 lb 9.6 oz (86.909 kg)] 191 lb 9.6 oz (86.909 kg) (06/11 0526)  Last BM Date: 09/15/12   24-hour weight change: Weight change:   Weight trends: Filed Weights   09/15/12 1900 09/16/12 0443 09/16/12 0526  Weight: 172 lb 9.6 oz (78.291 kg) 169 lb (76.658 kg) 191 lb 9.6 oz (86.909 kg)    Intake/Output:        Physical Exam: BP 113/74  Pulse 95  Temp(Src) 98.1 F (36.7 C) (Oral)  Resp 18  Ht 5\' 7"  (1.702 m)  Wt 191 lb 9.6 oz (86.909 kg)  BMI 30 kg/m2  SpO2 99%  General: Vital signs reviewed and noted.   Head: Normocephalic, atraumatic.  Eyes: conjunctivae/corneas clear.  EOM's intact.   Throat: normal  Neck:  normal  Lungs:    clear  Heart:  RR, normal S1, S2  Abdomen:  Soft, non-tender, non-distended   Extremities: No c.c.e   Neurologic: A&O X3, CN II - XII are grossly intact.   Psych: Normal     Labs: BMET:  Recent Labs  09/15/12 1701 09/15/12 1938 09/16/12 0513  NA 138  --  143  K 4.3  --  4.4  CL 103  --  107  CO2 22  --  28  GLUCOSE 90  --  104*  BUN 18  --  15  CREATININE 0.85  --  0.93  CALCIUM 10.0  --  8.8  MG  --  2.4  --     Liver function tests: No results found for this basename: AST, ALT, ALKPHOS, BILITOT, PROT, ALBUMIN,  in the last 72 hours No results found for this basename: LIPASE, AMYLASE,  in the last 72 hours  CBC:  Recent Labs  09/15/12 1701 09/16/12 0513  WBC 9.1 7.8  NEUTROABS 4.9  --   HGB 14.9 12.9*  HCT 43.3 39.3    MCV 77.0* 78.4  PLT 155 194    Cardiac Enzymes:  Recent Labs  09/15/12 1651 09/15/12 1938 09/16/12 0130  TROPONINI <0.30 <0.30 <0.30    Coagulation Studies:  Recent Labs  09/15/12 1938  LABPROT 13.8  INR 1.07    Other: No components found with this basename: POCBNP,  No results found for this basename: DDIMER,  in the last 72 hours  Recent Labs  09/15/12 1938  HGBA1C 5.5    Recent Labs  09/16/12 0513  CHOL 145  HDL 42  LDLCALC 70  TRIG 166*  CHOLHDL 3.5    Recent Labs  09/15/12 1938  TSH 0.977   No results found for this basename: VITAMINB12, FOLATE, FERRITIN, TIBC, IRON, RETICCTPCT,  in the last 72 hours   Other results:  Tele:  NSR.  Medications:    Infusions: . sodium chloride 75 mL/hr at 09/15/12 2047  . heparin 1,150 Units/hr (09/15/12 2047)    Scheduled Medications: . aspirin EC  81 mg  Oral Daily  . atenolol  50 mg Oral QHS  . gabapentin  900 mg Oral QHS  . lamoTRIgine  300 mg Oral QHS  . loratadine  10 mg Oral q morning - 10a  . mirtazapine  45 mg Oral QHS  . morphine  30 mg Oral Q12H  . pantoprazole  40 mg Oral Daily  . regadenoson  0.4 mg Intravenous Once  . simvastatin  20 mg Oral q1800    Assessment/ Plan:       Exertional chest pain - he has had CP in the past and has had a normal cath in 2003.  He needs to exercise more.    OK for DC if myoview is negative.  If the myoview shows ischemia, please call our PA to add him to the cath schedule tomorrow.  Active Problems:   DEPRESSION, MAJOR, RECURRENT   ANXIETY   HYPERTENSION, BENIGN   BACK PAIN, LOW   Disposition: for Myoview today.  OK for DC if his myoview is negative Length of Stay: 1  Vesta Mixer, Montez Hageman., MD, [facility] 09/16/2012, 7:14 AM Office [phone] Pager [phone]

## 2012-09-16 NOTE — Progress Notes (Signed)
Pt provided with dc instructions and education. Pt has no questions at this time. IV removed with tip intact. Heart monitor cleaned and returned to front. Isabella Ida, RN 

## 2012-09-16 NOTE — H&P (Signed)
Family Medicine Teaching Service Attending Note  I interviewed and examined patient [name] and reviewed their tests and x-rays.  I discussed with Dr. Trixie Deis and reviewed their note for today.  I agree with their assessment and plan.     Additionally  No chest pain this am Exertional chest pain definitely needs work up  Proceed as per cardiology

## 2012-09-16 NOTE — Progress Notes (Signed)
Family Medicine Teaching Service Daily Progress Note Intern Pager: [phone]  Patient name: [name] Medical record number: [id number] Date of birth: [birth year] Age: 57 y.o. Gender: male  Primary Care Provider: Ardyth Gal, MD  Subjective: Pt seen at bedside this morning. No complaints other than some mild headache and slight dizziness. No current chest pain or SOB. Eager for cardiac testing and hopeful for it to be normal so he can go home.  Objective: Temp:  [97.5 F (36.4 C)-98.4 F (36.9 C)] 98.1 F (36.7 C) (06/11 0526) Pulse Rate:  [61-95] 95 (06/11 0526) Resp:  [11-21] 18 (06/11 0526) BP: (109-143)/(65-112) 113/74 mmHg (06/11 0526) SpO2:  [94 %-100 %] 99 % (06/11 0526) Weight:  [169 lb (76.658 kg)-191 lb 9.6 oz (86.909 kg)] 191 lb 9.6 oz (86.909 kg) (06/11 0526) Exam: General: adult male in NAD, lying in bed, alert/oriented Cardiovascular: RRR, no murmur appreciated Respiratory: CTAB, no wheezes, normal WOB Abdomen: soft, nontender, BS+ Extremities: warm, well-perfused, no LE edema  Laboratory:  Recent Labs Lab 09/15/12 1701 09/16/12 0513  WBC 9.1 7.8  HGB 14.9 12.9*  HCT 43.3 39.3  PLT 155 194    Recent Labs Lab 09/15/12 1331 09/15/12 1701 09/16/12 0513  NA 137 138 143  K 4.3 4.3 4.4  CL 102 103 107  CO2 26 22 28   BUN 19 18 15   CREATININE 0.99 0.85 0.93  CALCIUM 9.4 10.0 8.8  GLUCOSE 97 90 104*   Pro-BNP 6/10: 8.4   Recent Labs Lab 09/15/12 1938  INR 1.07  APTT 31    Recent Labs Lab 09/15/12 1651 09/15/12 1938 09/16/12 0130  TROPONINI <0.30 <0.30 <0.30   Imaging/Diagnostic Tests: CXR, 6/10 @1636   Findings: Cardiomediastinal silhouette is stable. No acute  infiltrate or pleural effusion. No pulmonary edema.  IMPRESSION:  No active disease.  EKG: 6/10 - normal sinus rhythm, precordial T-wave inversions  6/11 - normal sinus rhythm, grossly unchanged  Assessment and Plan: Klayten Jolliff is a 57 y.o. year old male presenting with  chest pain concerning for unstable angina. PMH significant for HTN, history of many drugs used (quit several years ago), bipolar disorder/depression, and chronic pain (managed through pain clinic).   #Chest pain evaluation - concerning for possible ACS/unstable angina; CXR and troponin negative in the ED  -EKG without frank ischemia but nonspecific T-wave changes in precordial leads; AM repeat EKG unchanged -troponins negative x3 -AM Hb 12.9, down from 14.9, believe dilutional from IVF -cardiology consulted, recommendations appreciated  -on heparin drip, plan for stress Myoview today  -if Myoview abnormal, may proceed to cath vs discharge home if normal -continue aspirin 81 mg daily, sublingual nitro PRN, Zofran PRN  -started Zocor 20 mg per ACS protocol -risk stratification labs  -lipid panel shows LDL 70, on Zocor now as above  -Hb A1c 5.5  -TSH 0.977 [ ]  will f/u Myoview and any further cardiology recommendations  #HTN - intermittently elevated, as high as 131/112; more consistently 140's/80's-90's around time of admission -BP 6/11 AM 110's/70's -continue home atenolol 50 mg qPM  [ ]  monitor BP and consider medication adjustments inpt vs outpt   Chronic issues  #Bipolar disorder/depression/anxiety - on Lamictal 300 mg qHS, Remeron 45mg  qHS, and Xanax 0.5 mg TID PRN  -ordered for home regimen   #Chronic pain - on Neurontin, MS Contin 30 mg TID and Percocet 10 q12 PRN  -ordered for home regimen   #GERD - ordered for Protonix (formulary substitute for home omeprazole)   #Allergies - ordered for  home Claritin 10 mg daily   FEN/GI: NPO since midnight due to possible cath; if Myoview normal, advance to heart-healthy diet  -NS at 75 mL/h Prophylaxis: on heparin drip per cards; on home PPI  Disposition: -management as above  -discharge pending resolution of symptoms and work-up recommendations as above. Code Status: Full  Bobbye Morton, MD 09/16/2012, 8:42 AM PGY-1, [facility]  Health Family Medicine FPTS Intern pager: [phone]

## 2012-09-16 NOTE — Discharge Summary (Signed)
Family Medicine Teaching [facility] Discharge Summary  Patient name: [name] Medical record number: [id number] Date of birth: [birth year] Age: 57 y.o. Gender: male Date of Admission: 09/15/2012  Date of Discharge: 09/16/2012 Admitting Physician: Carney Living, MD  Primary Care Provider: Ardyth Gal, MD  Indication for Hospitalization: chest pain concerning for unstable angina Discharge Diagnoses:  Unstable angina/exertional chest pain Dyspnea on exertion HTN Chronic low back pain GERD Anxiety/depression/bipolar disorder  Consultations: Cardiology (Dr. Elease Hashimoto)  Significant Labs and Imaging:   Recent Labs Lab 09/15/12 1701 09/16/12 0513  WBC 9.1 7.8  HGB 14.9 12.9*  HCT 43.3 39.3  PLT 155 194    Recent Labs Lab 09/15/12 1331 09/15/12 1701 09/15/12 1938 09/16/12 0513  NA 137 138  --  143  K 4.3 4.3  --  4.4  CL 102 103  --  107  CO2 26 22  --  28  GLUCOSE 97 90  --  104*  BUN 19 18  --  15  CREATININE 0.99 0.85  --  0.93  CALCIUM 9.4 10.0  --  8.8  MG  --   --  2.4  --     Recent Labs Lab 09/15/12 1651 09/15/12 1938 09/16/12 0130 09/16/12 0740  TROPONINI <0.30 <0.30 <0.30 <0.30   ED EKG 6/10: no frank ischemic markers but nonspecific T-wave changes in precordial leads (changed from Jan 2014) Repeat EKG 6/11: unchanged from 6/10  UA 6/10: unremarkable UDS 6/11: positive only for opioids (pt on chronic MS Contin/Percocet through pain clinic) Lipid panel 6/11:  TG 166, HDL 42, LDL 70 TSH 6/10: 0.977 Hb H0Q: 5.5  CXR 6/10: Negative for acute process  Procedures:  Stress Myoview 6/11 Findings: The patient was stressed with Lexiscan. There were no  diagnostic EKG changes. The raw nuclear data reveals no excess  motion. Tomographic images with stress reveal a small area of mild  decreased activity at the apical cap. The rest images are very  similar to the stress images. There is no reversibility in the  apical cap. Wall motion is  normal. Ejection fraction is greater  than 70%.  IMPRESSION:  Normal pharmacologic stress nuclear scan. No scar or ischemia.  Normal wall motion. This is a low risk scan.  Brief Hospital Course: [name] is a 57 y.o. year old male presenting with chest pain concerning for unstable angina. PMH significant for HTN, history of many drugs used (quit several years ago), bipolar disorder/depression, and chronic pain (managed through pain clinic). At time of admission (pt initially presented to PCP clinic), chest pain had mostly resolved, and was completely resolved by time of discharge. Pt remained afebrile and generally felt well throughout the admission. See below for details by problem.  #Chest pain evaluation - symptoms were concerning for possible ACS/unstable angina -CXR and troponin were negative in the ED  -ED EKG was without frank ischemia but nonspecific T-wave changes in precordial leads -AM repeat EKG was unchanged and pt had troponins negative x3  -AM Hb 12.9, down from 14.9, believed to be dilutional from IVF  -cardiology consulted, recommendations appreciated   -pt was on heparin drip overnight 6/10-6/11  -pt had a negative stress myoview (findings/conclusions above) -if Myoview abnormal, may proceed to cath vs discharge home if normal  -pt was continued on beta blocker (atenolol), aspirin 81 mg daily -pt did not require sublingual nitro PRN -pt was started Zocor 20 mg per ACS protocol and this was continued at discharge -risk stratification labs   -  lipid panel shows LDL 70, on Zocor now as above   -Hb A1c 5.5   -TSH 0.977   #HTN - intermittently elevated, as high as 131/112 (?erroneous reading) -more consistently 140's/80's-90's around time of admission with BP 6/11 AM 110's/70's  -pt was continued on home atenolol 50 mg qPM   Chronic issues  #Bipolar disorder/depression/anxiety - continued on home Lamictal, Remeron, and Xanax  #Chronic pain - continued on home Neurontin,  MS Contin 30 mg TID and Percocet 10 q12 PRN #GERD - pt received Protonix (formulary substitute for home omeprazole)  #Allergies - continued home Claritin 10 mg daily   Discharge Medications:    Medication List    TAKE these medications       ALPRAZolam 1 MG tablet  Commonly known as:  XANAX  Take 0.5 mg by mouth 3 (three) times daily as needed. For anxiety     aspirin EC 81 MG tablet  Take 81 mg by mouth every morning.     atenolol 50 MG tablet  Commonly known as:  TENORMIN  Take 50 mg by mouth every evening.     fluticasone 50 MCG/ACT nasal spray  Commonly known as:  FLONASE  Place 2 sprays into the nose daily.     gabapentin 300 MG capsule  Commonly known as:  NEURONTIN  Take 900 mg by mouth at bedtime.     glucosamine-chondroitin 500-400 MG tablet  Take 1 tablet by mouth every morning.     lamoTRIgine 100 MG tablet  Commonly known as:  LAMICTAL  Take 300 mg by mouth at bedtime.     loratadine 10 MG tablet  Commonly known as:  CLARITIN  Take 10 mg by mouth every morning.     mirtazapine 30 MG tablet  Commonly known as:  REMERON  Take 45 mg by mouth at bedtime.     morphine 30 MG 12 hr tablet  Commonly known as:  MS CONTIN  Take 30 mg by mouth 3 (three) times daily.     multivitamin with minerals Tabs  Take 1 tablet by mouth every morning.     omeprazole 20 MG capsule  Commonly known as:  PRILOSEC  Take 20 mg by mouth every evening.     oxyCODONE-acetaminophen 10-325 MG per tablet  Commonly known as:  PERCOCET  Take 1 tablet by mouth every 12 (twelve) hours.     simvastatin 20 MG tablet  Commonly known as:  ZOCOR  Take 1 tablet (20 mg total) by mouth daily at 6 PM.     tiotropium 18 MCG inhalation capsule  Commonly known as:  SPIRIVA  Place 18 mcg into inhaler and inhale daily as needed. For shortness of breath     vitamin C 500 MG tablet  Commonly known as:  ASCORBIC ACID  Take 500 mg by mouth daily.       Disposition: discharge  home  Issues for Follow Up: 1. Chest pain - Generally negative work-up, as above. Pt advised to continue current medications and was started on Zocor 20 daily this admission (already on beta blocker, daily ASA). Pt instructed to call [facility] to either keep already scheduled appt on 7/28 or to see if he needs to be seen by them, sooner.  2. HTN - Pt had some lability of blood pressure here (low 100's/70's to high of 140's/90's). Recommend monitoring and adjustment of blood pressure medication(s) as an outpt.  3. Chronic Pain - Pt should continue to see pain clinic providers  as needed.  4. Other Chronic issues (bipolar/depression/anxiety, GERD, allergies) - Follow up as needed.  Outstanding Results:  Discharge Instructions: Please refer to Patient Instructions section of EMR for full details.  Patient was counseled important signs and symptoms that should prompt return to medical care, changes in medications, dietary instructions, activity restrictions, and follow up appointments.   Follow-up Information   Follow up with Rodman Pickle, MD On 09/22/2012. (Appointment time at 2:15 PM)    Contact information:   [address] [place] [address] [phone]       Follow up with Peter [place], MD On 11/02/2012. (Appointment time at 3:30 PM)    Contact information:   [address] [place] [address] [phone]      Discharge Condition: stable  Zamariah Seaborn, [place], MD 09/16/2012, 5:15 PM

## 2012-09-16 NOTE — Progress Notes (Signed)
ANTICOAGULATION CONSULT NOTE - Follow Up Consult  Pharmacy Consult for heparin Indication: chest pain/ACS  Labs:  Recent Labs  09/15/12 1331 09/15/12 1651 09/15/12 1701 09/15/12 1938 09/16/12 0130 09/16/12 0513  HGB  --   --  14.9  --   --  12.9*  HCT  --   --  43.3  --   --  39.3  PLT  --   --  155  --   --  194  APTT  --   --   --  31  --   --   LABPROT  --   --   --  13.8  --   --   INR  --   --   --  1.07  --   --   HEPARINUNFRC  --   --   --   --   --  0.51  CREATININE 0.99  --  0.85  --   --  0.93  TROPONINI  --  <0.30  --  <0.30 <0.30  --     Assessment/Plan:  57yo male therapeutic on heparin with initial dosing for CP.  Will continue gtt at current rate and confirm stable with additional level.  Vernard Gambles, PharmD, BCPS  09/16/2012,6:46 AM

## 2012-09-20 ENCOUNTER — Other Ambulatory Visit: Payer: Self-pay | Admitting: Family Medicine

## 2012-09-22 ENCOUNTER — Ambulatory Visit (INDEPENDENT_AMBULATORY_CARE_PROVIDER_SITE_OTHER): Payer: Medicare Other | Admitting: Family Medicine

## 2012-09-22 ENCOUNTER — Encounter: Payer: Self-pay | Admitting: Family Medicine

## 2012-09-22 VITALS — BP 145/95 | HR 71 | Temp 97.7°F | Wt 173.0 lb

## 2012-09-22 DIAGNOSIS — I10 Essential (primary) hypertension: Secondary | ICD-10-CM

## 2012-09-22 NOTE — Progress Notes (Signed)
Patient ID: Khale Nigh, male   DOB: December 24, 1955, 57 y.o.   MRN: [id number]  [facility] Jafet Wissing M. Mahkayla Preece, MD Phone: [phone]   Subjective: HPI: Patient is a 57 y.o. male presenting to clinic today for follow up appointment for exertional chest pain hospital admission. He had a normal stress test and negative cardiac enzymes at that admission.  Currently he is doing well. He states he is feeling more anxious since cutting back on his Xanax, but he feels better when he takes it as it was prescribed. He denies any chest pain, or shortness of breath. Is started to slowly increase activity again.  He has a friend with him today who states that Mr. Rosengren feels paranoid about his hospitalization. He states that since we knew about his drug use we treated him differently and checked his blood more. I gave him reassurance that was not the case. We had to draw so many labs due to chest pain rule out. He states he did not want anything to interfere with his pain medicine clinic contract, and I told him if they had any concerns, I would be happy to speak with them.  Has cardiology follow up appointment next month  History Reviewed: Former smoker. Health Maintenance: UTD  ROS: Please see HPI above.  Objective: Office vital signs reviewed. There were no vitals taken for this visit.  Physical Examination:  General: Awake, alert. NAD. Overall well appearing. HEENT: Atraumatic, normocephalic  Pulm: CTAB, no wheezes Cardio: RRR, no murmurs appreciated Abdomen:soft, nontender, nondistended Extremities: No edema Neuro: Grossly intact  Assessment: 57 y.o. male hospital follow up  Plan: See Problem List and After Visit Summary

## 2012-09-22 NOTE — Assessment & Plan Note (Signed)
Admitted for unstable angina for chest pain rule out. He had negative testing. Overall, doing well. Encouraged to take all medications as prescribed including his anxiety medication. He has no other concerns today. Increase exercise slowly, keep cardiology appointment in July and RTC in 6-8 weeks for routine follow up. If he has any concerns before then, he will let us know.

## 2012-09-22 NOTE — Patient Instructions (Addendum)
It was good to see you today. I am glad you are feeling better. Keep slowly increasing your exercise as tolerated.   Keep your appointment with Cardiology in July. Come back to see [place] in 6-8 weeks for a regular follow up.  Let us know if anything changes or if you need anything.  Amberle Lyter M. Keaisha Sublette, M.D.

## 2012-10-03 ENCOUNTER — Encounter (HOSPITAL_COMMUNITY): Payer: Self-pay | Admitting: Emergency Medicine

## 2012-10-03 ENCOUNTER — Emergency Department (HOSPITAL_COMMUNITY): Payer: Medicare Other

## 2012-10-03 ENCOUNTER — Emergency Department (HOSPITAL_COMMUNITY)
Admission: EM | Admit: 2012-10-03 | Discharge: 2012-10-03 | Disposition: A | Discharge status: Home / Self Care | Payer: Medicare Other | Attending: Emergency Medicine | Admitting: Emergency Medicine

## 2012-10-03 DIAGNOSIS — F319 Bipolar disorder, unspecified: Secondary | ICD-10-CM | POA: Insufficient documentation

## 2012-10-03 DIAGNOSIS — Z7982 Long term (current) use of aspirin: Secondary | ICD-10-CM | POA: Insufficient documentation

## 2012-10-03 DIAGNOSIS — F329 Major depressive disorder, single episode, unspecified: Secondary | ICD-10-CM | POA: Insufficient documentation

## 2012-10-03 DIAGNOSIS — R42 Dizziness and giddiness: Secondary | ICD-10-CM | POA: Insufficient documentation

## 2012-10-03 DIAGNOSIS — R0600 Dyspnea, unspecified: Secondary | ICD-10-CM

## 2012-10-03 DIAGNOSIS — I10 Essential (primary) hypertension: Secondary | ICD-10-CM | POA: Insufficient documentation

## 2012-10-03 DIAGNOSIS — G8929 Other chronic pain: Secondary | ICD-10-CM | POA: Insufficient documentation

## 2012-10-03 DIAGNOSIS — R0989 Other specified symptoms and signs involving the circulatory and respiratory systems: Secondary | ICD-10-CM | POA: Insufficient documentation

## 2012-10-03 DIAGNOSIS — Z87891 Personal history of nicotine dependence: Secondary | ICD-10-CM | POA: Insufficient documentation

## 2012-10-03 DIAGNOSIS — F410 Panic disorder [episodic paroxysmal anxiety] without agoraphobia: Secondary | ICD-10-CM | POA: Insufficient documentation

## 2012-10-03 DIAGNOSIS — M6281 Muscle weakness (generalized): Secondary | ICD-10-CM | POA: Insufficient documentation

## 2012-10-03 DIAGNOSIS — B1920 Unspecified viral hepatitis C without hepatic coma: Secondary | ICD-10-CM | POA: Insufficient documentation

## 2012-10-03 DIAGNOSIS — J309 Allergic rhinitis, unspecified: Secondary | ICD-10-CM | POA: Insufficient documentation

## 2012-10-03 DIAGNOSIS — Z79899 Other long term (current) drug therapy: Secondary | ICD-10-CM | POA: Insufficient documentation

## 2012-10-03 DIAGNOSIS — F3289 Other specified depressive episodes: Secondary | ICD-10-CM | POA: Insufficient documentation

## 2012-10-03 DIAGNOSIS — R0609 Other forms of dyspnea: Secondary | ICD-10-CM | POA: Insufficient documentation

## 2012-10-03 DIAGNOSIS — R0602 Shortness of breath: Secondary | ICD-10-CM | POA: Insufficient documentation

## 2012-10-03 HISTORY — DX: Panic disorder (episodic paroxysmal anxiety): F41.0

## 2012-10-03 LAB — BASIC METABOLIC PANEL
BUN: 19 mg/dL (ref 6–23)
CO2: 28 mEq/L (ref 19–32)
Calcium: 9.3 mg/dL (ref 8.4–10.5)
Chloride: 104 mEq/L (ref 96–112)
Creatinine, Ser: 1 mg/dL (ref 0.50–1.35)
GFR calc Af Amer: >90 mL/min (ref >90)
GFR calc non Af Amer: 83 mL/min — ABNORMAL LOW (ref >90)
Glucose, Bld: 131 mg/dL — ABNORMAL HIGH (ref 70–99)
Potassium: 4 mEq/L (ref 3.5–5.1)
Sodium: 141 mEq/L (ref 135–145)

## 2012-10-03 LAB — CBC WITH DIFFERENTIAL/PLATELET
Basophils Absolute: 0 10*3/uL (ref 0.0–0.1)
Basophils Relative: 0 % (ref 0–1)
Eosinophils Absolute: 0.1 10*3/uL (ref 0.0–0.7)
Eosinophils Relative: 1 % (ref 0–5)
HCT: 41.1 % (ref 39.0–52.0)
Hemoglobin: 13.9 g/dL (ref 13.0–17.0)
Lymphocytes Relative: 39 % (ref 12–46)
Lymphs Abs: 2.8 10*3/uL (ref 0.7–4.0)
MCH: 26.5 pg (ref 26.0–34.0)
MCHC: 33.8 g/dL (ref 30.0–36.0)
MCV: 78.3 fL (ref 78.0–100.0)
Monocytes Absolute: 0.4 10*3/uL (ref 0.1–1.0)
Monocytes Relative: 6 % (ref 3–12)
Neutro Abs: 3.9 10*3/uL (ref 1.7–7.7)
Neutrophils Relative %: 54 % (ref 43–77)
Platelets: 175 10*3/uL (ref 150–400)
RBC: 5.25 MIL/uL (ref 4.22–5.81)
RDW: 14.5 % (ref 11.5–15.5)
WBC: 7.2 10*3/uL (ref 4.0–10.5)

## 2012-10-03 LAB — TROPONIN I: Troponin I: <0.3 ng/mL (ref <0.30)

## 2012-10-03 LAB — POCT I-STAT TROPONIN I: Troponin i, poc: 0.02 ng/mL (ref 0.00–0.08)

## 2012-10-03 LAB — D-DIMER, QUANTITATIVE (NOT AT ARMC): D-Dimer, Quant: <0.27 ug/mL-FEU (ref 0.00–0.48)

## 2012-10-03 NOTE — ED Notes (Signed)
Pt was taking lots of pain medicine but sts he recently cut back.

## 2012-10-03 NOTE — ED Notes (Addendum)
Pt sts this morning he woke up early to walk the dog, like he usually does, when he was going up the hill he started to get CP and SOB. Once he got home he laid down for a few minutes and felt fine, got up and went to a yard sell with his friend, then started to feel lightheaded and sob, but no CP. Then went home to lay down for a little, continued to not fell quite right so came to ED. Pt didn't eat breakfast this morning. Pt in nad, skin warm and dry, resp e/u.

## 2012-10-03 NOTE — ED Notes (Signed)
This morning walking dog at 0400 and having chest pain/tightness and shortness of breath; admitted a few weeks ago for same symptoms, states watched heart for three days and didn't come up with anything. States something isn't right and feels like almost going to pass out with activity.

## 2012-10-03 NOTE — ED Provider Notes (Signed)
History    CSN: [id number] Arrival date & time 10/03/12  1230  First MD Initiated Contact with Patient 10/03/12 1233     Chief Complaint  Patient presents with  . Chest Pain   (Consider location/radiation/quality/duration/timing/severity/associated sxs/prior Treatment) HPI   Patient developed generalized weakness while walking this morning. States he felt like "I was going to pass out" symptoms improved with rest. He is presently asymptomatic. He also complains of shortness of breath with exertion. For several months. Patient seen here for same complaint 09/15/2012. Had nuclear stress test 09/16/2012 which showed no abnormality. No treatment prior to coming here. No other associated complaint. Denies chest pain Past Medical History  Diagnosis Date  . Hypertension   . Bipolar disorder   . Allergy   . Depression   . Hepatitis C   . Chronic pain   . Panic attack    Past Surgical History  Procedure Laterality Date  . Tonsillectomy    . Cardiac catheterization  2003    No CAD   No family history on file. History  Substance Use Topics  . Smoking status: Former Smoker -- 2.00 packs/day for 30 years    Types: Cigarettes    Quit date: 04/08/2005  . Smokeless tobacco: Never Used  . Alcohol Use: No    Review of Systems  Constitutional: Negative.   HENT: Negative.   Respiratory: Positive for shortness of breath.        Dyspnea for several months.  Cardiovascular: Negative.   Gastrointestinal: Negative.   Musculoskeletal: Negative.   Skin: Negative.   Neurological: Positive for light-headedness.  Psychiatric/Behavioral: Negative.   All other systems reviewed and are negative.    Allergies  Review of patient's allergies indicates no known allergies.  Home Medications   Current Outpatient Rx  Name  Route  Sig  Dispense  Refill  . ALPRAZolam (XANAX) 1 MG tablet   Oral   Take 0.5 mg by mouth 3 (three) times daily as needed. For anxiety         . aspirin EC 81 MG  tablet   Oral   Take 81 mg by mouth every morning.         Marland Kitchen atenolol (TENORMIN) 50 MG tablet   Oral   Take 50 mg by mouth every evening.         . fluticasone (FLONASE) 50 MCG/ACT nasal spray      PLACE 2 SPRAYS INTO THE NOSE DAILY.   16 g   11   . gabapentin (NEURONTIN) 300 MG capsule   Oral   Take 900 mg by mouth at bedtime.          Marland Kitchen glucosamine-chondroitin 500-400 MG tablet   Oral   Take 1 tablet by mouth every morning.         . lamoTRIgine (LAMICTAL) 100 MG tablet   Oral   Take 300 mg by mouth at bedtime.          Marland Kitchen loratadine (CLARITIN) 10 MG tablet   Oral   Take 10 mg by mouth every morning.          . mirtazapine (REMERON) 30 MG tablet   Oral   Take 45 mg by mouth at bedtime.          Marland Kitchen morphine (MS CONTIN) 30 MG 12 hr tablet   Oral   Take 30 mg by mouth 3 (three) times daily.          . Multiple Vitamin (  MULTIVITAMIN WITH MINERALS) TABS   Oral   Take 1 tablet by mouth every morning.         Marland Kitchen omeprazole (PRILOSEC) 20 MG capsule   Oral   Take 20 mg by mouth every evening.         Marland Kitchen oxyCODONE-acetaminophen (PERCOCET) 10-325 MG per tablet   Oral   Take 1 tablet by mouth every 12 (twelve) hours.          . simvastatin (ZOCOR) 20 MG tablet   Oral   Take 1 tablet (20 mg total) by mouth daily at 6 PM.   30 tablet   0   . tiotropium (SPIRIVA) 18 MCG inhalation capsule   Inhalation   Place 18 mcg into inhaler and inhale daily as needed. For shortness of breath         . vitamin C (ASCORBIC ACID) 500 MG tablet   Oral   Take 500 mg by mouth daily.          BP 149/89  Pulse 66  Temp(Src) 97.9 F (36.6 C) (Oral)  Resp 16  SpO2 99% Physical Exam  Nursing note and vitals reviewed. Constitutional: He appears well-developed and well-nourished.  HENT:  Head: Normocephalic and atraumatic.  Eyes: Conjunctivae are normal. Pupils are equal, round, and reactive to light.  Neck: Neck supple. No tracheal deviation present. No  thyromegaly present.  Cardiovascular: Normal rate and regular rhythm.   No murmur heard. Pulmonary/Chest: Effort normal and breath sounds normal.  Abdominal: Soft. Bowel sounds are normal. He exhibits no distension. There is no tenderness.  Musculoskeletal: Normal range of motion. He exhibits no edema and no tenderness.  Neurological: He is alert. Coordination normal.  Skin: Skin is warm and dry. No rash noted.  Psychiatric: He has a normal mood and affect.    ED Course  Procedures (including critical care time) Labs Reviewed  CBC WITH DIFFERENTIAL  BASIC METABOLIC PANEL  D-DIMER, QUANTITATIVE  TROPONIN I   Dg Chest 2 View  10/03/2012   *RADIOLOGY REPORT*  Clinical Data: Chest pain  CHEST - 2 VIEW  Comparison: 09/15/2012  Findings: Normal heart size.  Lungs are under aerated and grossly clear.  No pneumothorax. No pleural effusion.  IMPRESSION: No active cardiopulmonary disease.   Original Report Authenticated By: Jolaine Click, M.D.   No diagnosis found.  Date: 10/03/2012  Rate: 65  Rhythm: normal sinus rhythm  QRS Axis: normal  Intervals: normal  ST/T Wave abnormalities: nonspecific T wave changes  Conduction Disutrbances:none  Narrative Interpretation:   Old EKG Reviewed: No significant change from 09/16/2012 interpreted by me Chest x-ray reviewed by me  Results for orders placed during the hospital encounter of 10/03/12  CBC WITH DIFFERENTIAL      Result Value Range   WBC 7.2  4.0 - 10.5 K/uL   RBC 5.25  4.22 - 5.81 MIL/uL   Hemoglobin 13.9  13.0 - 17.0 g/dL   HCT 16.1  09.6 - 04.5 %   MCV 78.3  78.0 - 100.0 fL   MCH 26.5  26.0 - 34.0 pg   MCHC 33.8  30.0 - 36.0 g/dL   RDW 40.9  81.1 - 91.4 %   Platelets 175  150 - 400 K/uL   Neutrophils Relative % 54  43 - 77 %   Neutro Abs 3.9  1.7 - 7.7 K/uL   Lymphocytes Relative 39  12 - 46 %   Lymphs Abs 2.8  0.7 - 4.0 K/uL   Monocytes  Relative 6  3 - 12 %   Monocytes Absolute 0.4  0.1 - 1.0 K/uL   Eosinophils Relative 1  0  - 5 %   Eosinophils Absolute 0.1  0.0 - 0.7 K/uL   Basophils Relative 0  0 - 1 %   Basophils Absolute 0.0  0.0 - 0.1 K/uL  BASIC METABOLIC PANEL      Result Value Range   Sodium 141  135 - 145 mEq/L   Potassium 4.0  3.5 - 5.1 mEq/L   Chloride 104  96 - 112 mEq/L   CO2 28  19 - 32 mEq/L   Glucose, Bld 131 (*) 70 - 99 mg/dL   BUN 19  6 - 23 mg/dL   Creatinine, Ser 0.98  0.50 - 1.35 mg/dL   Calcium 9.3  8.4 - 11.9 mg/dL   GFR calc non Af Amer 83 (*) >90 mL/min   GFR calc Af Amer >90  >90 mL/min  D-DIMER, QUANTITATIVE      Result Value Range   D-Dimer, Quant <0.27  0.00 - 0.48 ug/mL-FEU  TROPONIN I      Result Value Range   Troponin I <0.30  <0.30 ng/mL  POCT I-STAT TROPONIN I      Result Value Range   Troponin i, poc 0.02  0.00 - 0.08 ng/mL   Comment 3            Dg Chest 2 View  10/03/2012   *RADIOLOGY REPORT*  Clinical Data: Chest pain  CHEST - 2 VIEW  Comparison: 09/15/2012  Findings: Normal heart size.  Lungs are under aerated and grossly clear.  No pneumothorax. No pleural effusion.  IMPRESSION: No active cardiopulmonary disease.   Original Report Authenticated By: Jolaine Click, M.D.   Nm Myocar Multi W/spect W/wall Motion / Ef  09/16/2012   *RADIOLOGY REPORT*  Clinical Data:  The patient has had chest pain.  Study is done for further evaluation.  MYOCARDIAL IMAGING WITH SPECT (REST AND PHARMACOLOGIC-STRESS) GATED LEFT VENTRICULAR WALL MOTION STUDY LEFT VENTRICULAR EJECTION FRACTION  Technique:  Standard myocardial SPECT imaging was performed after resting intravenous injection of  mCi Tc-32m . Subsequently, intravenous infusion of  was performed under the supervision of the Cardiology staff.  At peak effect of the drug,  mCi Tc-17m  was injected intravenously and standard myocardial SPECT  imaging was performed.  Quantitative gated imaging was also performed to evaluate left ventricular wall motion, and estimate left ventricular ejection fraction.  Findings: The patient was stressed  with Lexiscan. There were no diagnostic EKG changes.  The raw nuclear data reveals no excess motion.  Tomographic images with stress reveal a small area of mild decreased activity at the apical cap.  The rest images are very similar to the stress images.  There is no reversibility in the apical cap.  Wall motion is normal.  Ejection fraction is greater than 70%.  IMPRESSION: Normal pharmacologic stress nuclear scan.  No scar or ischemia. Normal wall motion.  This is a low risk scan.   Original Report Authenticated By: Willa Rough, M.D.   Dg Chest Port 1 View  09/15/2012   *RADIOLOGY REPORT*  Clinical Data: Left chest pain  PORTABLE CHEST - 1 VIEW  Comparison: 05/19/2003  Findings: Cardiomediastinal silhouette is stable.  No acute infiltrate or pleural effusion.  No pulmonary edema.  IMPRESSION: No active disease.   Original Report Authenticated By: Natasha Mead, M.D.    MDM   Doubt acute coronary syndrome. Patient had  negative nuclear medicine cardiac study earlier this month.2 neg markers, non acute ecg Doubt pulmonary embolism i.e. negative d-dimer low pretest clinical probability Spoke with Dr. Pollie Meyer plan appointment has been scheduled at the family practice Center for July 1 11 AM Diagnosis #1 dyspnea #2 weakness    Doug Sou, MD 10/03/12 1851

## 2012-10-06 ENCOUNTER — Encounter: Payer: Self-pay | Admitting: Family Medicine

## 2012-10-06 ENCOUNTER — Ambulatory Visit (INDEPENDENT_AMBULATORY_CARE_PROVIDER_SITE_OTHER): Payer: Medicare Other | Admitting: Family Medicine

## 2012-10-06 VITALS — BP 151/83 | HR 62 | Ht 67.0 in | Wt 170.0 lb

## 2012-10-06 DIAGNOSIS — R0600 Dyspnea, unspecified: Secondary | ICD-10-CM

## 2012-10-06 DIAGNOSIS — R0609 Other forms of dyspnea: Secondary | ICD-10-CM

## 2012-10-06 DIAGNOSIS — R0989 Other specified symptoms and signs involving the circulatory and respiratory systems: Secondary | ICD-10-CM

## 2012-10-06 NOTE — Patient Instructions (Addendum)
1- Stop the Neurontin/Gabapentin   *Call if experiencing worsening symptoms after discontinuing Neurontin  2- Discuss lightheadedness/dizziness with pain management Doctor 3- follow-up in 1 month

## 2012-10-06 NOTE — Assessment & Plan Note (Addendum)
Assessment Dicussed extensively with patient the causes of presyncope, and the fact that several of his medications can cause lightheadedness/dizziness  *discussed the low probability that this is cardiac related given the results of the ED work-up    Plan - D/C Neurontin today  *Advised pt to call if symptoms worsened, as we may need to wean Neurontin if abrupt withdrawal is problematic - Pt has visit with Pain management Dr in 3 wks  *Advised pt to discuss side-effect profiles with his physician to determine if his pain regime could be adapted, especially  given that he is self-altering his dosing to suit his day to day needs in attempt to wean himself

## 2012-10-06 NOTE — Progress Notes (Signed)
  [facility]  Patient name: [name] MRN [id number]  Date of birth: 19-Dec-1955  CC & HPI:  [name] is a 57 y.o. male presenting today for difficulty breathing and dizziness. [name] reports 3-4 episodes of difficulty breathing over the past 38mo. These episodes have occurred when he attempts to walk for exercise. The initial breathing difficulty is accompanied by feelings of dizziness/lightheadedness "like he's about to passout." He stops walking and lies down in the grass until he is able to regain his breath; The feelings of lightheadedness can last for several hours. - Associated symptoms: Feeling fatigued for the past 38mo along with slowed thinking and difficulty remembering things.  - He has been to the ED for work-up of these symptoms, and is discouraged by being told that this is all anxiety related - He denies any LOC, palpitations, diaphoresis, motor weakness, or balance problems - He does admit to increased anxiety lately, but reports that this the result of the difficulty breathing episodes and not the cause - He is also curious if obtaining a new CPAP machine would be beneficial, as he occasional trouble breathing at night; waking up "like he got a shot of adrenaline" and taking a Xanax to get back to sleep  Pertinent History Reviewed:  Medical & Surgical Hx:  Reviewed: Significant for Anxiety & Depression (currently managed with Xanax); Chronic pain (currently managed by pain specialist with Lamictal, MS Contin, Remeron, Oxycontin, Neurontin) Medications & Allergies: Reviewed & Updated - see associated section Social History: Reviewed -  reports that he quit smoking about 7 years ago. His smoking use included Cigarettes. He has a 60 pack-year smoking history. He has never used smokeless tobacco.  Objective Findings:  Vitals: BP 151/83  Pulse 62  Ht 5\' 7"  (1.702 m)  Wt 170 lb (77.111 kg)  BMI 26.62 kg/m2  SpO2 97%  Eyes:Sclera white; Conjunctiva pink;  PERRLA; EOMI; Visual Fields full by confrontation; No nystagmus Heart: RRR, No m/r/g; No Carotid bruits; No JVD Lungs: CTAB; Lungs resonant Psych: A&Ox4, flat affect, noticeable mild tremor in hands  Assessment & Plan:

## 2012-10-07 ENCOUNTER — Telehealth: Payer: Self-pay | Admitting: *Deleted

## 2012-10-07 NOTE — Telephone Encounter (Signed)
Pt reports laying on the couch for the past three hours and being unable to catch his breath " I know that yall say there is nothing wrong with me and I know that I'm a hypochondriac but I'm scared that something is going to happen."  Pt encouraged to call 911 if he feels that he is in immediate danger - " Nah I got somebody that could take me but I will just sit there and nothing will happen. I mean is there another place that I can go that has different machines,cause yall dont seem to know what wrong with me" Again , stated that if pt were evaluated in the ED he would be assured that nothing was wrong and that it was up to him where he sought treatment at.  Asked pt if he was currently under stress if he enjoyed walking, exercising - " nope I have done all that doesn't help". "Asked if pt was taking meds for anxiety - " well I knew that you were going to say that I had cut back on all that and then I thought about it and started taking them again" Asked pt if he had ever spoken to Dr. Pascal Lux psychologist " Nope but I'll be glad to talk with her but she is going to say nothing is wrong with me" Again offered pt chance to be seen in the office " I was just there yall can't do nothing for me" Encouraged to call 911 if he felt  Necessary or call office to make appointment. Pt verbalized understanding.  Spoke with Spero Geralds regarding pt - will call to give pt her number for appointment and also will put in [facility] referral at her suggestion.  Informed Clint Guy of pt conversation. Wyatt Haste, RN-BSN

## 2012-10-07 NOTE — Telephone Encounter (Signed)
Pt called and given Spero Geralds number and told to call for appointment - referral faxed to [facility]. Pt informed that he would be receiving call possibly for evaluation of possible services. Wyatt Haste, RN-BSN

## 2012-10-12 ENCOUNTER — Telehealth: Payer: Self-pay | Admitting: Psychology

## 2012-10-12 NOTE — Telephone Encounter (Signed)
Mr. Tourigny left a VM asking to schedule an appointment.  I called him back and left a VM.

## 2012-10-12 NOTE — Telephone Encounter (Signed)
[name] called to schedule an appointment.  In talking to him, he expressed concern about his health care providers at the [facility] being upset if he got therapy here.  Discussed.  He felt most comfortable discussing it with Dr. Mila Homer (psychiatrist) before scheduling an appointment with me.  He has gotten therapy at the [facility] before but states that it is often a 2 hour wait and he has a bad back and can't sit that long.    He thinks that perhaps his breathing difficulties might be anxiety related and has found the Xanax to be helpful.  He does not like taking it because it makes him sleepy but because it helps his breathing, he is willing to do that.  He thinks therapy has the potential to be helpful in managing his anxiety as well but states he doesn't think it is due to any unresolved issues as he has had a lot of therapy in the past.    Will forward to PCP.  He appreciated being able to talk to our triage nurse and said she was very helpful - will forward to her as well.

## 2012-10-13 ENCOUNTER — Telehealth: Payer: Self-pay | Admitting: Psychology

## 2012-10-13 NOTE — Telephone Encounter (Signed)
[name] left a VM.  Returned his call and left a VM.

## 2012-10-14 ENCOUNTER — Encounter: Payer: Self-pay | Admitting: Family Medicine

## 2012-10-14 NOTE — Telephone Encounter (Signed)
Talked with [name].  He did not call [facility].  He is interested in an appointment but only afternoon because he sleeps until 10:00 or 11:00.  First available was August 4th at 2:00.  He agreed.  I told him the following: -  If he is unable to make the appointment he needs to call me. -  If he misses the appointment without a phone call, I won't be able to schedule him back in my clinic. He voiced an understanding and was able to repeat the appointment date and time back to me.

## 2012-10-28 ENCOUNTER — Telehealth: Payer: Self-pay | Admitting: Psychology

## 2012-10-28 NOTE — Telephone Encounter (Signed)
Left VM for [name] canceling his August 4th appointment.  I will not be in the office.  Asked him to call me back to reschedule.  He needs and afternoon appointment.

## 2012-10-29 NOTE — Telephone Encounter (Signed)
Mr. Lamison left a VM.  He stated that he got an appointment with the "other place" (I assume the [facility]) and would not need to see me.  Will let his PCP know.

## 2012-11-01 ENCOUNTER — Emergency Department (HOSPITAL_COMMUNITY): Payer: Medicare Other

## 2012-11-01 ENCOUNTER — Encounter (HOSPITAL_COMMUNITY): Payer: Self-pay | Admitting: Neurology

## 2012-11-01 ENCOUNTER — Emergency Department (HOSPITAL_COMMUNITY)
Admission: EM | Admit: 2012-11-01 | Discharge: 2012-11-01 | Disposition: A | Discharge status: Home / Self Care | Payer: Medicare Other | Attending: Emergency Medicine | Admitting: Emergency Medicine

## 2012-11-01 DIAGNOSIS — R0602 Shortness of breath: Secondary | ICD-10-CM

## 2012-11-01 DIAGNOSIS — F4000 Agoraphobia, unspecified: Secondary | ICD-10-CM | POA: Insufficient documentation

## 2012-11-01 DIAGNOSIS — Z79899 Other long term (current) drug therapy: Secondary | ICD-10-CM | POA: Insufficient documentation

## 2012-11-01 DIAGNOSIS — F319 Bipolar disorder, unspecified: Secondary | ICD-10-CM | POA: Insufficient documentation

## 2012-11-01 DIAGNOSIS — I10 Essential (primary) hypertension: Secondary | ICD-10-CM | POA: Insufficient documentation

## 2012-11-01 DIAGNOSIS — R42 Dizziness and giddiness: Secondary | ICD-10-CM | POA: Diagnosis present

## 2012-11-01 DIAGNOSIS — F329 Major depressive disorder, single episode, unspecified: Secondary | ICD-10-CM | POA: Insufficient documentation

## 2012-11-01 DIAGNOSIS — B1920 Unspecified viral hepatitis C without hepatic coma: Secondary | ICD-10-CM | POA: Insufficient documentation

## 2012-11-01 DIAGNOSIS — Z7982 Long term (current) use of aspirin: Secondary | ICD-10-CM | POA: Insufficient documentation

## 2012-11-01 DIAGNOSIS — Z87891 Personal history of nicotine dependence: Secondary | ICD-10-CM | POA: Insufficient documentation

## 2012-11-01 DIAGNOSIS — F3289 Other specified depressive episodes: Secondary | ICD-10-CM | POA: Insufficient documentation

## 2012-11-01 LAB — CBC WITH DIFFERENTIAL/PLATELET
Basophils Absolute: 0 10*3/uL (ref 0.0–0.1)
Basophils Relative: 0 % (ref 0–1)
Eosinophils Absolute: 0.1 10*3/uL (ref 0.0–0.7)
Eosinophils Relative: 1 % (ref 0–5)
HCT: 43.5 % (ref 39.0–52.0)
Hemoglobin: 15.5 g/dL (ref 13.0–17.0)
Lymphocytes Relative: 25 % (ref 12–46)
Lymphs Abs: 2.5 10*3/uL (ref 0.7–4.0)
MCH: 27.3 pg (ref 26.0–34.0)
MCHC: 35.6 g/dL (ref 30.0–36.0)
MCV: 76.7 fL — ABNORMAL LOW (ref 78.0–100.0)
Monocytes Absolute: 0.8 10*3/uL (ref 0.1–1.0)
Monocytes Relative: 9 % (ref 3–12)
Neutro Abs: 6.3 10*3/uL (ref 1.7–7.7)
Neutrophils Relative %: 65 % (ref 43–77)
Platelets: 235 10*3/uL (ref 150–400)
RBC: 5.67 MIL/uL (ref 4.22–5.81)
RDW: 14.2 % (ref 11.5–15.5)
WBC: 9.7 10*3/uL (ref 4.0–10.5)

## 2012-11-01 LAB — HEPATIC FUNCTION PANEL
ALT: 31 U/L (ref 0–53)
AST: 28 U/L (ref 0–37)
Albumin: 4.8 g/dL (ref 3.5–5.2)
Alkaline Phosphatase: 85 U/L (ref 39–117)
Bilirubin, Direct: 0.1 mg/dL (ref 0.0–0.3)
Indirect Bilirubin: 0.2 mg/dL — ABNORMAL LOW (ref 0.3–0.9)
Total Bilirubin: 0.3 mg/dL (ref 0.3–1.2)
Total Protein: 8.2 g/dL (ref 6.0–8.3)

## 2012-11-01 LAB — BASIC METABOLIC PANEL
BUN: 14 mg/dL (ref 6–23)
CO2: 24 mEq/L (ref 19–32)
Calcium: 10.2 mg/dL (ref 8.4–10.5)
Chloride: 104 mEq/L (ref 96–112)
Creatinine, Ser: 0.79 mg/dL (ref 0.50–1.35)
GFR calc Af Amer: >90 mL/min (ref >90)
GFR calc non Af Amer: >90 mL/min (ref >90)
Glucose, Bld: 106 mg/dL — ABNORMAL HIGH (ref 70–99)
Potassium: 4.2 mEq/L (ref 3.5–5.1)
Sodium: 140 mEq/L (ref 135–145)

## 2012-11-01 LAB — D-DIMER, QUANTITATIVE (NOT AT ARMC): D-Dimer, Quant: <0.27 ug/mL-FEU (ref 0.00–0.48)

## 2012-11-01 LAB — POCT I-STAT TROPONIN I: Troponin i, poc: 0 ng/mL (ref 0.00–0.08)

## 2012-11-01 NOTE — ED Provider Notes (Signed)
CSN: [id number]     Arrival date & time 11/01/12  1308 History     None    Chief Complaint  Patient presents with  . Shortness of Breath   (Consider location/radiation/quality/duration/timing/severity/associated sxs/prior Treatment) Patient is a 56 y.o. male presenting with shortness of breath.  Shortness of Breath Severity:  Moderate Onset quality:  Gradual Duration:  5 weeks Timing:  Intermittent Progression:  Worsening Chronicity:  Recurrent Context: activity   Relieved by:  Rest Worsened by:  Exertion Ineffective treatments:  None tried Associated symptoms: diaphoresis   Associated symptoms: no abdominal pain, no chest pain, no claudication, no cough, no fever, no headaches, no neck pain, no PND, no rash, no sore throat, no syncope, no vomiting and no wheezing     Past Medical History  Diagnosis Date  . Hypertension   . Bipolar disorder   . Allergy   . Depression   . Hepatitis C   . Chronic pain   . Panic attack    Past Surgical History  Procedure Laterality Date  . Tonsillectomy    . Cardiac catheterization  2003    No CAD   No family history on file. History  Substance Use Topics  . Smoking status: Former Smoker -- 2.00 packs/day for 30 years    Types: Cigarettes    Quit date: 04/08/2005  . Smokeless tobacco: Never Used  . Alcohol Use: No    Review of Systems  Constitutional: Positive for diaphoresis. Negative for fever, chills, activity change, appetite change and fatigue.  HENT: Negative for congestion, sore throat, rhinorrhea, sneezing, trouble swallowing and neck pain.   Eyes: Negative for pain and redness.  Respiratory: Positive for shortness of breath. Negative for cough, choking, chest tightness, wheezing and stridor.   Cardiovascular: Negative for chest pain, claudication, leg swelling, syncope and PND.  Gastrointestinal: Negative for nausea, vomiting, abdominal pain, diarrhea, constipation, blood in stool, abdominal distention and anal  bleeding.  Musculoskeletal: Negative for myalgias and back pain.  Skin: Negative for rash.  Neurological: Positive for light-headedness. Negative for dizziness, speech difficulty, weakness, numbness and headaches.  Hematological: Negative for adenopathy.  Psychiatric/Behavioral: Negative for confusion.    Allergies  Review of patient's allergies indicates no known allergies.  Home Medications   Current Outpatient Rx  Name  Route  Sig  Dispense  Refill  . ALPRAZolam (XANAX) 1 MG tablet   Oral   Take 0.5 mg by mouth 3 (three) times daily as needed for anxiety.          Marland Kitchen aspirin EC 81 MG tablet   Oral   Take 81 mg by mouth every morning.         . fluticasone (FLONASE) 50 MCG/ACT nasal spray   Nasal   Place 2 sprays into the nose daily as needed for rhinitis or allergies.          Marland Kitchen lamoTRIgine (LAMICTAL) 100 MG tablet   Oral   Take 200 mg by mouth at bedtime.          . mirtazapine (REMERON) 45 MG tablet   Oral   Take 45 mg by mouth at bedtime.         Marland Kitchen morphine (MS CONTIN) 30 MG 12 hr tablet   Oral   Take 30 mg by mouth every morning.          . Multiple Vitamin (MULTIVITAMIN WITH MINERALS) TABS   Oral   Take 1 tablet by mouth every morning.         Marland Kitchen  omeprazole (PRILOSEC) 20 MG capsule   Oral   Take 20 mg by mouth every evening.         Marland Kitchen oxyCODONE (OXYCONTIN) 10 MG 12 hr tablet   Oral   Take 10 mg by mouth every 6 (six) hours as needed for pain.         Marland Kitchen tiotropium (SPIRIVA) 18 MCG inhalation capsule   Inhalation   Place 18 mcg into inhaler and inhale daily.          BP 135/84  Pulse 77  Temp(Src) 98 F (36.7 C) (Oral)  Resp 11  SpO2 100% Physical Exam  Nursing note and vitals reviewed. Constitutional: He is oriented to person, place, and time. He appears well-developed and well-nourished. No distress.  HENT:  Head: Normocephalic and atraumatic.  Eyes: Conjunctivae and EOM are normal. Right eye exhibits no discharge. Left eye  exhibits no discharge.  Neck: Normal range of motion. Neck supple. No tracheal deviation present.  Cardiovascular: Normal rate, regular rhythm and normal heart sounds.  Exam reveals no friction rub.   No murmur heard. Pulmonary/Chest: Effort normal and breath sounds normal. No stridor. No respiratory distress. He has no wheezes. He has no rales. He exhibits no tenderness.  Abdominal: Soft. He exhibits no distension. There is no tenderness. There is no rebound and no guarding.  Neurological: He is alert and oriented to person, place, and time.  Skin: Skin is warm.  Psychiatric: He has a normal mood and affect.    ED Course   Procedures (including critical care time)  Labs Reviewed  BASIC METABOLIC PANEL  CBC WITH DIFFERENTIAL   Results for orders placed during the hospital encounter of 11/01/12  BASIC METABOLIC PANEL      Result Value Range   Sodium 140  135 - 145 mEq/L   Potassium 4.2  3.5 - 5.1 mEq/L   Chloride 104  96 - 112 mEq/L   CO2 24  19 - 32 mEq/L   Glucose, Bld 106 (*) 70 - 99 mg/dL   BUN 14  6 - 23 mg/dL   Creatinine, Ser 1.61  0.50 - 1.35 mg/dL   Calcium 09.6  8.4 - 04.5 mg/dL   GFR calc non Af Amer >90  >90 mL/min   GFR calc Af Amer >90  >90 mL/min  CBC WITH DIFFERENTIAL      Result Value Range   WBC 9.7  4.0 - 10.5 K/uL   RBC 5.67  4.22 - 5.81 MIL/uL   Hemoglobin 15.5  13.0 - 17.0 g/dL   HCT 40.9  81.1 - 91.4 %   MCV 76.7 (*) 78.0 - 100.0 fL   MCH 27.3  26.0 - 34.0 pg   MCHC 35.6  30.0 - 36.0 g/dL   RDW 78.2  95.6 - 21.3 %   Platelets 235  150 - 400 K/uL   Neutrophils Relative % 65  43 - 77 %   Neutro Abs 6.3  1.7 - 7.7 K/uL   Lymphocytes Relative 25  12 - 46 %   Lymphs Abs 2.5  0.7 - 4.0 K/uL   Monocytes Relative 9  3 - 12 %   Monocytes Absolute 0.8  0.1 - 1.0 K/uL   Eosinophils Relative 1  0 - 5 %   Eosinophils Absolute 0.1  0.0 - 0.7 K/uL   Basophils Relative 0  0 - 1 %   Basophils Absolute 0.0  0.0 - 0.1 K/uL  D-DIMER, QUANTITATIVE  Result  Value Range   D-Dimer, Quant <0.27  0.00 - 0.48 ug/mL-FEU  HEPATIC FUNCTION PANEL      Result Value Range   Total Protein 8.2  6.0 - 8.3 g/dL   Albumin 4.8  3.5 - 5.2 g/dL   AST 28  0 - 37 U/L   ALT 31  0 - 53 U/L   Alkaline Phosphatase 85  39 - 117 U/L   Total Bilirubin 0.3  0.3 - 1.2 mg/dL   Bilirubin, Direct 0.1  0.0 - 0.3 mg/dL   Indirect Bilirubin 0.2 (*) 0.3 - 0.9 mg/dL  POCT I-STAT TROPONIN I      Result Value Range   Troponin i, poc 0.00  0.00 - 0.08 ng/mL   Comment 3             Dg Chest 2 View  11/01/2012   *RADIOLOGY REPORT*  Clinical Data: Shortness of breath  CHEST - 2 VIEW  Comparison:  October 03, 2012  Findings:  The lungs are clear.  The heart size and pulmonary vascularity are normal.  No adenopathy.  No bone lesions.  IMPRESSION: No edema or consolidation.   Original Report Authenticated By: Bretta Bang, M.D.   No diagnosis found.    Date: 11/01/2012  Rate: 71  Rhythm: normal sinus rhythm  QRS Axis: normal  Intervals: normal  ST/T Wave abnormalities: nonspecific T wave changes  Conduction Disutrbances:left posterior fascicular block  Narrative Interpretation: normal sinus rhythm with no changes from previous ECG  Old EKG Reviewed: unchanged   MDM  [name] 57 y.o. presents with shortness of breath and lightheadedness that has been occurring for 5-7 months. Has had multiple work ups for this in the past without definitive diagnosis. He does note himself to be very anxious and becomes more anxious when he thinks about this. He's afebrile vital signs are stable. Is in no acute distress. Patient is anxious. No respiratory distress. Equal breath sounds bilaterally. Patient was recently admitted for chest pain and a negative nuclear imaging study. This x-ray negative for pneumothorax, pneumonia, or mediastinal widening. Troponin negative. Hemoglobin stable. LFTs within normal limits. Patient is low-risk unwell criteria, d-dimer obtained negative. Doubt PE. Patient  likely suffering from ongoing anxiety. He was instructed to followup with primary care provider for further management of his anxiety. He is strong return precautions which she understood.  Labs, imaging, EKG reviewed. I discussed patient's care at my attending, Dr. Oletta Lamas.     Sena Hitch, MD 11/02/12 [phone]

## 2012-11-01 NOTE — ED Notes (Signed)
Received pt via EMS with c/o shortness of breath intermittently for 6-7 months. Pt has been seen here and by PMD for same, had multiple tests done but no cause found. This episode began 2 days increases with exertion. Pt reports feeling lightheaded. Pt denies pain.

## 2012-11-01 NOTE — ED Provider Notes (Signed)
I saw and evaluated the patient, reviewed the resident's note and I agree with the findings and plan.  I reviewed ECG and agree with Dr. Matthias Hughs interpretation.  Pt with recurring visits for SOB.  Pt with normal saturations.  ECG and troponin and here DDimer are normal.  Pt has had nuclear cardiac test recently, normal, still has referral to cardiologist scheduled for tomorrow.  Pt encouraged to keep appt, can always return if he feels worse in any way.  I suspect some anxiety and fear of unknown is causing some of his symptoms.  Pt has a PCP as well that he can continue to follow up with as well.    Gavin Pound. Majd Tissue, MD 11/02/12 2258

## 2012-11-02 ENCOUNTER — Ambulatory Visit (INDEPENDENT_AMBULATORY_CARE_PROVIDER_SITE_OTHER): Payer: Medicare Other | Admitting: Cardiology

## 2012-11-02 ENCOUNTER — Encounter: Payer: Self-pay | Admitting: Cardiology

## 2012-11-02 VITALS — BP 150/102 | HR 87 | Ht 67.0 in | Wt 167.1 lb

## 2012-11-02 DIAGNOSIS — G4733 Obstructive sleep apnea (adult) (pediatric): Secondary | ICD-10-CM

## 2012-11-02 DIAGNOSIS — I10 Essential (primary) hypertension: Secondary | ICD-10-CM

## 2012-11-02 DIAGNOSIS — R0602 Shortness of breath: Secondary | ICD-10-CM

## 2012-11-02 NOTE — Patient Instructions (Signed)
We will schedule you for an Echocardiogram   If normal I would recommend you follow up with pulmonary.

## 2012-11-02 NOTE — Progress Notes (Signed)
[name] Date of Birth: April 23, 1955 Medical Record #[id number]  History of Present Illness: [name] is seen for a post hospital visit. He is a 57 year old white male admitted on 09/15/2012 for evaluation of chest pain and dyspnea. Patient reports that he has had dyspnea for over a year. He has had episodes where he suddenly becomes short of breath. He states he can't get his breath and feels chest tightness. This has happened several times and required either emergency room or hospitalization for evaluation. His most recent episode lasted 6-8 hours. He was admitted to the hospital ruled out for myocardial infarction. He had a [place] Myoview study which was normal. He had a previous cardiac catheterization in 2003 which was normal. He does have a history of hypertension. He is on chronic disability because of an old back injury. He is on multiple pain medications and antianxiety medications. He reports that he quit smoking 8-9 years ago. He complains of fatigue. He has been on inhaler therapy but doesn't think that this is helping. He was last seen by pulmonary over 3 years ago. He does have a history of obstructive sleep apnea but reports that he was unable to tolerate CPAP therapy.  Current Outpatient Prescriptions on File Prior to Visit  Medication Sig Dispense Refill  . ALPRAZolam (XANAX) 1 MG tablet Take 0.5 mg by mouth 3 (three) times daily as needed for anxiety.       Marland Kitchen aspirin EC 81 MG tablet Take 81 mg by mouth every morning.      . fluticasone (FLONASE) 50 MCG/ACT nasal spray Place 2 sprays into the nose daily as needed for rhinitis or allergies.       Marland Kitchen lamoTRIgine (LAMICTAL) 100 MG tablet Take 200 mg by mouth at bedtime.       . mirtazapine (REMERON) 45 MG tablet Take 45 mg by mouth at bedtime.      Marland Kitchen morphine (MS CONTIN) 30 MG 12 hr tablet Take 30 mg by mouth every morning.       . Multiple Vitamin (MULTIVITAMIN WITH MINERALS) TABS Take 1 tablet by mouth every morning.      Marland Kitchen  omeprazole (PRILOSEC) 20 MG capsule Take 20 mg by mouth every evening.      Marland Kitchen oxyCODONE (OXYCONTIN) 10 MG 12 hr tablet Take 10 mg by mouth every 6 (six) hours as needed for pain.      Marland Kitchen tiotropium (SPIRIVA) 18 MCG inhalation capsule Place 18 mcg into inhaler and inhale daily.       Current Facility-Administered Medications on File Prior to Visit  Medication Dose Route Frequency Provider Last Rate Last Dose  . 0.9 %  sodium chloride infusion   Intravenous Continuous Hilarie Fredrickson, MD 20 mL/hr at 09/15/12 1635 500 mL at 09/15/12 1635    No Known Allergies  Past Medical History  Diagnosis Date  . Hypertension   . Bipolar disorder   . Allergy   . Depression   . Hepatitis C   . Chronic pain   . Panic attack   . Dyspnea     Past Surgical History  Procedure Laterality Date  . Tonsillectomy    . Cardiac catheterization  2003    No CAD    History  Smoking status  . Former Smoker -- 2.00 packs/day for 30 years  . Types: Cigarettes  . Quit date: 04/08/2005  Smokeless tobacco  . Never Used    History  Alcohol Use No    No family  history on file.  Review of Systems: As noted in history of present illness.  All other systems were reviewed and are negative.  Physical Exam: BP 150/102  Pulse 87  Ht 5\' 7"  (1.702 m)  Wt 167 lb 1.9 oz (75.805 kg)  BMI 26.17 kg/m2  SpO2 95% He is a chronically ill-appearing white male in no acute distress. HEENT: Normocephalic, atraumatic. Pupils equal round and reactive. Oropharynx is clear. Neck is without JVD, adenopathy, thyromegaly, or bruits. Lungs: Clear Cardiovascular: Regular rate and rhythm. Normal S1 and S2. No gallop, murmur, or click. Abdomen: Soft and nontender. No masses or hepatosplenomegaly. Bowel sounds are positive. Extremities. Femoral and pedal pulses are 2+ and symmetric. He has no edema or cyanosis. Skin: Warm and dry Neurological alert and oriented x3. Cranial nerves II through XII are intact.  LABORATORY  DATA: Lab Results  Component Value Date   WBC 9.7 11/01/2012   HGB 15.5 11/01/2012   HCT 43.5 11/01/2012   PLT 235 11/01/2012   GLUCOSE 106* 11/01/2012   CHOL 145 09/16/2012   TRIG 166* 09/16/2012   HDL 42 09/16/2012   LDLDIRECT 70 04/24/2011   LDLCALC 70 09/16/2012   ALT 31 11/01/2012   AST 28 11/01/2012   NA 140 11/01/2012   K 4.2 11/01/2012   CL 104 11/01/2012   CREATININE 0.79 11/01/2012   BUN 14 11/01/2012   CO2 24 11/01/2012   TSH 0.977 09/15/2012   PSA 0.38 04/24/2011   INR 1.07 09/15/2012   HGBA1C 5.5 09/15/2012   ECG 11/01/2012 shows normal sinus rhythm with minimal nonspecific ST abnormality. Myoview study shows normal perfusion with ejection fraction of 70%. Chest x-ray showed no active disease.  Assessment / Plan: 1. Dyspnea and atypical chest pain. Based on his recent findings I think it is unlikely that his symptoms are cardiac related. We will obtain an echocardiogram to complete his evaluation. If unremarkable I would consider repeat evaluation by pulmonary. I think the patient probably does have a significant component of deconditioning related to his chronic disability. He also has a significant anxiety component. 2. Hypertension 3. Anxiety with history of panic attacks. 4. Bipolar disorder.

## 2012-11-03 ENCOUNTER — Encounter: Payer: Self-pay | Admitting: Internal Medicine

## 2012-11-03 ENCOUNTER — Ambulatory Visit (INDEPENDENT_AMBULATORY_CARE_PROVIDER_SITE_OTHER): Payer: Medicare Other | Admitting: Internal Medicine

## 2012-11-03 VITALS — BP 146/110 | HR 82 | Temp 97.5°F | Ht 67.0 in | Wt 170.8 lb

## 2012-11-03 DIAGNOSIS — I10 Essential (primary) hypertension: Secondary | ICD-10-CM

## 2012-11-03 DIAGNOSIS — R0609 Other forms of dyspnea: Secondary | ICD-10-CM

## 2012-11-03 DIAGNOSIS — R0600 Dyspnea, unspecified: Secondary | ICD-10-CM

## 2012-11-03 NOTE — Patient Instructions (Addendum)
You do not have significant lung damage from smoking and you never will unless you start smoking   Your breathing difficulty may be related to vocal chords tightening up which can be due to acid reflux or nerves  GERD (REFLUX)  is an extremely common cause of respiratory symptoms, many times with no significant heartburn at all.    It can be treated with medication, but also with lifestyle changes including avoidance of late meals, excessive alcohol, smoking cessation, and avoid fatty foods, chocolate, peppermint, colas, red wine, and acidic juices such as orange juice.  NO MINT OR MENTHOL PRODUCTS SO NO COUGH DROPS  USE SUGARLESS CANDY INSTEAD (jolley ranchers or Stover's)  NO OIL BASED VITAMINS - use powdered substitutes.  If the problem continue I recommend prilosec 20mg  Take 30-60 min before first meal of the day   Start back atenolol 50 mg one half daily  rec consider clonidine as an alternative for both nerves/ pain and blood pressure.   No pulmonary follow up needed

## 2012-11-03 NOTE — Assessment & Plan Note (Signed)
-   11/03/2012  Walked RA x 3 laps @ 185 ft each stopped due to  End of study, no desat - Spirometry 11/03/2012 FEV1  2.41 (69%) ratio 78 s f/v abn's  Symptoms are markedly disproportionate to objective findings and not clear this is a lung problem but pt does appear to have difficult airway management issues. DDX of  difficult airways managment all start with A and  include Adherence, Ace Inhibitors, Acid Reflux, Active Sinus Disease, Alpha 1 Antitripsin deficiency, Anxiety masquerading as Airways dz,  ABPA,  allergy(esp in young), Aspiration (esp in elderly), Adverse effects of DPI,  Active smokers, plus two Bs  = Bronchiectasis and Beta blocker use..and one C= CHF  Adherence is always the initial "prime suspect" and is a multilayered concern that requires a "trust but verify" approach in every patient - starting with knowing how to use medications, especially inhalers, correctly, keeping up with refills and understanding the fundamental difference between maintenance and prns vs those medications only taken for a very short course and then stopped and not refilled.  He is very confused with concept of medication reconciliation and without this it will be very difficult to manage his problems as an outpt.  ? Anxiety > usually a dx of exclusion but at the top of the list based on his exam today  ? Acid reflux > assoc with hoarseness/ voice fatigue suggests extra-es reflux/ lpr > diet and otc rx reviewed  No need for pulmonary rx or f/u at this point   See instructions for specific recommendations which were reviewed directly with the patient who was given a copy with highlighter outlining the key components.

## 2012-11-03 NOTE — Progress Notes (Signed)
  Subjective:    Patient ID: [name], male    DOB: [birth year]  MRN: [id number]  HPI  66 yowm quit smoking 2004 with breathing problems got better overall until mid 2013  and referred 11/03/2012 to pulmonary clinic by Dr Sw[place] for eval of sob.   11/03/2012 1st pulmonary eval cc episodes x 4 of severe sob to point where feels he's going to pass out and in between doe " like walking around the block".  The 4 episodes that occurred happened with exertion did not go away at rest, but usually he recovers his breathing just by sitting down, no better in past with inhalers.  Assoc with chest tightness but no cough or overt HB  No obvious pattern to daytime variabilty or assoc  subjective wheeze overt   hb symptoms. No unusual exp hx or h/o childhood pna/ asthma or knowledge of premature birth.    \Sleeping ok without nocturnal  or early am exacerbation  of respiratory  c/o's or need for noct saba. Also denies any obvious fluctuation of symptoms with weather or environmental changes or other aggravating or alleviating factors except as outlined above   Review of Systems  Constitutional: Negative for fever and unexpected weight change.  HENT: Positive for congestion, sore throat, trouble swallowing, postnasal drip and sinus pressure. Negative for ear pain, nosebleeds, rhinorrhea, sneezing and dental problem.   Eyes: Positive for redness and itching.  Respiratory: Positive for chest tightness and shortness of breath. Negative for cough and wheezing.   Cardiovascular: Positive for chest pain and palpitations. Negative for leg swelling.  Gastrointestinal: Positive for abdominal pain. Negative for nausea and vomiting.  Genitourinary: Negative for dysuria.  Musculoskeletal: Positive for back pain. Negative for joint swelling.  Skin: Negative for rash.  Neurological: Positive for headaches.  Hematological: Does not bruise/bleed easily.  Psychiatric/Behavioral: Positive for dysphoric mood. The patient is  nervous/anxious.        Objective:   Physical Exam   amb hoarse wm nad Wt Readings from Last 3 Encounters:  11/03/12 170 lb 12.8 oz (77.474 kg)  11/02/12 167 lb 1.9 oz (75.805 kg)  10/06/12 170 lb (77.111 kg)    Patient failed to answer a single question asked in a straightforward manner, tending to go off on tangents or answer questions with ambiguous medical terms or diagnoses and seemed aggravated  when asked the same question more than once for clarification.   HEENT: nl dentition, turbinates, and orophanx. Nl external ear canals without cough reflex   NECK :  without JVD/Nodes/TM/ nl carotid upstrokes bilaterally   LUNGS: no acc muscle use, clear to A and P bilaterally without cough on insp or exp maneuvers   CV:  RRR  no s3 or murmur or increase in P2, no edema   ABD:  soft and nontender with nl excursion in the supine position. No bruits or organomegaly, bowel sounds nl  MS:  warm without deformities, calf tenderness, cyanosis or clubbing  SKIN: warm and dry without lesions    NEURO:  alert, approp, no deficits    11/01/12  No edema or consolidation       Assessment & Plan:

## 2012-11-03 NOTE — Assessment & Plan Note (Signed)
rec he restart atenolol 50 mg one half daily and might consider chang to low dose clonidine or a catapres patch to address his hbp, anxiety, and narcotic dependency all with one medication.

## 2012-11-04 ENCOUNTER — Ambulatory Visit (HOSPITAL_COMMUNITY): Payer: Medicare Other | Attending: Cardiology

## 2012-11-04 DIAGNOSIS — R0989 Other specified symptoms and signs involving the circulatory and respiratory systems: Secondary | ICD-10-CM | POA: Insufficient documentation

## 2012-11-04 DIAGNOSIS — Z87891 Personal history of nicotine dependence: Secondary | ICD-10-CM | POA: Insufficient documentation

## 2012-11-04 DIAGNOSIS — G4733 Obstructive sleep apnea (adult) (pediatric): Secondary | ICD-10-CM

## 2012-11-04 DIAGNOSIS — R0609 Other forms of dyspnea: Secondary | ICD-10-CM | POA: Insufficient documentation

## 2012-11-04 DIAGNOSIS — R079 Chest pain, unspecified: Secondary | ICD-10-CM | POA: Insufficient documentation

## 2012-11-04 DIAGNOSIS — R5381 Other malaise: Secondary | ICD-10-CM | POA: Insufficient documentation

## 2012-11-04 DIAGNOSIS — R5383 Other fatigue: Secondary | ICD-10-CM | POA: Insufficient documentation

## 2012-11-04 DIAGNOSIS — R0602 Shortness of breath: Secondary | ICD-10-CM

## 2012-11-04 DIAGNOSIS — I10 Essential (primary) hypertension: Secondary | ICD-10-CM | POA: Insufficient documentation

## 2012-11-04 NOTE — Progress Notes (Signed)
Echocardiogram performed.  

## 2012-11-25 ENCOUNTER — Other Ambulatory Visit: Payer: Self-pay | Admitting: Family Medicine

## 2013-02-11 ENCOUNTER — Other Ambulatory Visit: Payer: Self-pay

## 2013-02-16 ENCOUNTER — Ambulatory Visit (INDEPENDENT_AMBULATORY_CARE_PROVIDER_SITE_OTHER): Payer: Medicare Other | Admitting: Family Medicine

## 2013-02-16 ENCOUNTER — Encounter: Payer: Self-pay | Admitting: Family Medicine

## 2013-02-16 VITALS — BP 121/86 | HR 79 | Temp 97.9°F | Ht 67.0 in | Wt 171.1 lb

## 2013-02-16 DIAGNOSIS — I10 Essential (primary) hypertension: Secondary | ICD-10-CM

## 2013-02-16 DIAGNOSIS — M545 Low back pain, unspecified: Secondary | ICD-10-CM

## 2013-02-16 MED ORDER — CYCLOBENZAPRINE HCL 5 MG PO TABS: 5.0000 mg | ORAL_TABLET | Freq: Three times a day (TID) | ORAL | Status: DC | PRN

## 2013-02-16 MED ORDER — KETOROLAC TROMETHAMINE 60 MG/2ML IM SOLN
60.0000 mg | Freq: Once | INTRAMUSCULAR | Status: AC
  Administered 2013-02-16: 60 mg via INTRAMUSCULAR

## 2013-02-16 NOTE — Addendum Note (Signed)
Addended by: Jone Baseman D on: 02/16/2013 09:30 AM   Modules accepted: Orders

## 2013-02-16 NOTE — Patient Instructions (Signed)
I'm sorry your back continues to hurt The medicine given to you today in our office should help significantly Please start taking the muscle relaxer up to 3 times daily I will discuss your case with your doctor to see if they would like to send you to physical therapy or sports medicine

## 2013-02-16 NOTE — Assessment & Plan Note (Signed)
Pt fustrated w/ continue pain Cannot Rx narcotics due to pain contract at pain mgt Continue swimming nad other exercises No acute worsenign Toradol 60 today in office (h/o GERD so may not be candidate for longterm NSAID use) Starting flexeril Would recommend considering sending to PT and or SM for possible spinal injections vs MRI.

## 2013-02-16 NOTE — Progress Notes (Signed)
[name] is a 57 y.o. male who presents to [facility] today for SD appt for back pain  Back pain: hurt back about 6 mo ago w/ persistent back pain since that time. Swims every day w/o benefit. Works in Loss adjuster, chartered). No recent exacerbation. Still going to pain clinic. Sitting and movement makes worse. HAs nto tried PT. Denies any loss of sensation, loss of bowel or bladder function.   2/14 xray reviewed.    The following portions of the patient's history were reviewed and updated as appropriate: allergies, current medications, past medical history, family and social history, and problem list.  Patient is a nonsmoker.   Past Medical History  Diagnosis Date  . Hypertension   . Bipolar disorder   . Allergy   . Depression   . Hepatitis C   . Chronic pain   . Panic attack   . Dyspnea     ROS as above otherwise neg.    Medications reviewed. Current Outpatient Prescriptions  Medication Sig Dispense Refill  . ALPRAZolam (XANAX) 1 MG tablet Take 0.5 mg by mouth 3 (three) times daily as needed for anxiety.       Marland Kitchen aspirin EC 81 MG tablet Take 81 mg by mouth every morning.      . fluticasone (FLONASE) 50 MCG/ACT nasal spray Place 2 sprays into the nose daily as needed for rhinitis or allergies.       Marland Kitchen lamoTRIgine (LAMICTAL) 100 MG tablet Take 200 mg by mouth at bedtime.       . Melatonin 10 MG CAPS Take 1 capsule by mouth at bedtime as needed.      . mirtazapine (REMERON) 45 MG tablet Take 45 mg by mouth at bedtime.      Marland Kitchen morphine (MS CONTIN) 30 MG 12 hr tablet Take 30 mg by mouth every morning.       . Multiple Vitamin (MULTIVITAMIN WITH MINERALS) TABS Take 1 tablet by mouth every morning.      Marland Kitchen omeprazole (PRILOSEC) 20 MG capsule Take 20 mg by mouth every evening.      Marland Kitchen omeprazole (PRILOSEC) 20 MG capsule TAKE ONE CAPSULE EVERY DAY  90 capsule  1  . oxyCODONE (OXYCONTIN) 10 MG 12 hr tablet Take 10 mg by mouth every 6 (six) hours as needed for pain.      Marland Kitchen  oxyCODONE-acetaminophen (PERCOCET) 10-325 MG per tablet Take 1 tablet by mouth every 6 (six) hours as needed for pain.      Marland Kitchen tiotropium (SPIRIVA) 18 MCG inhalation capsule Place 18 mcg into inhaler and inhale daily.       No current facility-administered medications for this visit.   Facility-Administered Medications Ordered in Other Visits  Medication Dose Route Frequency Kailly Richoux Last Rate Last Dose  . 0.9 %  sodium chloride infusion   Intravenous Continuous Hilarie Fredrickson, MD 20 mL/hr at 09/15/12 1635 500 mL at 09/15/12 1635    Exam:  BP 121/86  Pulse 79  Temp(Src) 97.9 F (36.6 C) (Oral)  Ht 5\' 7"  (1.702 m)  Wt 171 lb 1.6 oz (77.61 kg)  BMI 26.79 kg/m2 Gen: Well NAD HEENT: EOMI,  MMM Msk: diffuse lower thoracic and lumbar spine perispinus muscle tenderness/pain. No bony abnormality  No results found for this or any previous visit (from the past 72 hour(s)).

## 2013-02-16 NOTE — Assessment & Plan Note (Signed)
At goal today No change 

## 2013-03-02 ENCOUNTER — Telehealth: Payer: Self-pay | Admitting: Family Medicine

## 2013-03-09 ENCOUNTER — Ambulatory Visit (INDEPENDENT_AMBULATORY_CARE_PROVIDER_SITE_OTHER): Payer: Medicare Other | Admitting: Family Medicine

## 2013-03-09 ENCOUNTER — Ambulatory Visit: Payer: Medicare Other

## 2013-03-09 VITALS — BP 120/90 | HR 68 | Temp 98.0°F | Resp 18 | Ht 66.5 in | Wt 168.4 lb

## 2013-03-09 DIAGNOSIS — M545 Low back pain, unspecified: Secondary | ICD-10-CM

## 2013-03-09 DIAGNOSIS — M4120 Other idiopathic scoliosis, site unspecified: Secondary | ICD-10-CM

## 2013-03-09 DIAGNOSIS — M549 Dorsalgia, unspecified: Secondary | ICD-10-CM

## 2013-03-09 DIAGNOSIS — R109 Unspecified abdominal pain: Secondary | ICD-10-CM

## 2013-03-09 DIAGNOSIS — R197 Diarrhea, unspecified: Secondary | ICD-10-CM

## 2013-03-09 DIAGNOSIS — M546 Pain in thoracic spine: Secondary | ICD-10-CM

## 2013-03-09 LAB — COMPREHENSIVE METABOLIC PANEL
ALT: 22 U/L (ref 0–53)
AST: 20 U/L (ref 0–37)
Albumin: 4.3 g/dL (ref 3.5–5.2)
Alkaline Phosphatase: 63 U/L (ref 39–117)
BUN: 11 mg/dL (ref 6–23)
CO2: 31 mEq/L (ref 19–32)
Calcium: 9.6 mg/dL (ref 8.4–10.5)
Chloride: 102 mEq/L (ref 96–112)
Creat: 1.06 mg/dL (ref 0.50–1.35)
Glucose, Bld: 88 mg/dL (ref 70–99)
Potassium: 5.2 mEq/L (ref 3.5–5.3)
Sodium: 142 mEq/L (ref 135–145)
Total Bilirubin: 0.4 mg/dL (ref 0.3–1.2)
Total Protein: 7.6 g/dL (ref 6.0–8.3)

## 2013-03-09 LAB — LIPASE: Lipase: 32 U/L (ref 0–75)

## 2013-03-09 LAB — POCT CBC
Granulocyte percent: 63.3 %G (ref 37–80)
HCT, POC: 44.6 % (ref 43.5–53.7)
Hemoglobin: 13.8 g/dL — AB (ref 14.1–18.1)
Lymph, poc: 3.6 — AB (ref 0.6–3.4)
MCH, POC: 25.7 pg — AB (ref 27–31.2)
MCHC: 30.9 g/dL — AB (ref 31.8–35.4)
MCV: 83.3 fL (ref 80–97)
MID (cbc): 0.5 (ref 0–0.9)
MPV: 7.6 fL (ref 0–99.8)
POC Granulocyte: 7 — AB (ref 2–6.9)
POC LYMPH PERCENT: 32.5 %L (ref 10–50)
POC MID %: 4.2 %M (ref 0–12)
Platelet Count, POC: 328 10*3/uL (ref 142–424)
RBC: 5.36 M/uL (ref 4.69–6.13)
RDW, POC: 15.9 %
WBC: 11 10*3/uL — AB (ref 4.6–10.2)

## 2013-03-09 LAB — IFOBT (OCCULT BLOOD): IFOBT: NEGATIVE

## 2013-03-09 LAB — POCT SEDIMENTATION RATE: POCT SED RATE: 34 mm/hr — AB (ref 0–22)

## 2013-03-09 MED ORDER — POLYETHYLENE GLYCOL 3350 17 GM/SCOOP PO POWD: 17.0000 g | Freq: Two times a day (BID) | ORAL | Status: DC | PRN

## 2013-03-09 MED ORDER — SIMETHICONE 80 MG PO CHEW: 80.0000 mg | CHEWABLE_TABLET | Freq: Four times a day (QID) | ORAL | Status: DC | PRN

## 2013-03-09 NOTE — Progress Notes (Signed)
Subjective:   Chief Complaint  Patient presents with  . Back Pain    lower back pain  x 4 months and gets worse with activity  . Abdominal Pain    nausea and vomiting off & on for 2 weeks     Patient ID: [name], male    DOB: [birth year], 57 y.o.   MRN: [id number]  Back Pain Associated symptoms include abdominal pain. Pertinent negatives include no fever, numbness or weakness.  Abdominal Pain Associated symptoms include arthralgias, diarrhea, myalgias and vomiting. Pertinent negatives include no constipation, fever, frequency or hematuria.  3:21 PM-Ordered 25 mg benadryl, 20 mg Pepcid and 60 mg prednisone   HPI Comments: [name] is a 57 y.o. male who presents to the [facility] complaining of chronic back pain onset 4x months ago, pt states that he just wants to get an X-ray.  He states that he hurt his back again 4x months ago working but is unable to relate a specific injury to me today. He has tried water therapy daily which exacerbates the back pain. 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  He then felt the pain grow worse as he was laying down.  Pt is seen by multiple doctors for his back pain - most recently at the [facility] and advised by PCP there to go to physical therapy. Pt states that he called for the physical therapist referral and is awaiting their response but there is no documentation of this in Epic.     Pt also complains of sudden onset bilateral lower quadrant abd pain that is worse in lower right quadrant with associated vomiting and diarrhea for the past 2 wks intermittently. The pain radiates into his bilateral buttock and legs. Pt is having some episodes of fecal urgency with his diarrhea but no incontinence, He has had 1 episode of emesis. He states that he might have seen blood in the stool but was unable to describe. Pt has had a colonoscopy when he was 57 yo that  was normal.   Current Outpatient Prescriptions on File Prior to Visit  Medication Sig Dispense Refill  . ALPRAZolam (XANAX) 1 MG tablet Take 0.5 mg by mouth 3 (three) times daily as needed for anxiety.       Marland Kitchen aspirin EC 81 MG tablet Take 81 mg by mouth every morning.      . cyclobenzaprine (FLEXERIL) 5 MG tablet Take 1 tablet (5 mg total) by mouth 3 (three) times daily as needed for muscle spasms.  30 tablet  3  . mirtazapine (REMERON) 45 MG tablet Take 45 mg by mouth at bedtime.      . Multiple Vitamin (MULTIVITAMIN WITH MINERALS) TABS Take 1 tablet by mouth every morning.      Marland Kitchen omeprazole (PRILOSEC) 20 MG capsule Take 20 mg by mouth every evening.      Marland Kitchen oxyCODONE-acetaminophen (PERCOCET) 10-325 MG per tablet Take 1 tablet by mouth every 6 (six) hours as needed for pain.      Marland Kitchen tiotropium (SPIRIVA) 18 MCG inhalation capsule Place 18 mcg into inhaler and inhale daily.      . Melatonin 10 MG CAPS Take 1 capsule by mouth at bedtime as needed.      Marland Kitchen morphine (MS CONTIN) 30 MG 12 hr tablet Take 30 mg by mouth every morning.       Marland Kitchen  omeprazole (PRILOSEC) 20 MG capsule TAKE ONE CAPSULE EVERY DAY  90 capsule  1  . oxyCODONE (OXYCONTIN) 10 MG 12 hr tablet Take 10 mg by mouth every 6 (six) hours as needed for pain.       Current Facility-Administered Medications on File Prior to Visit  Medication Dose Route Frequency Provider Last Rate Last Dose  . 0.9 %  sodium chloride infusion   Intravenous Continuous Hilarie Fredrickson, MD 20 mL/hr at 09/15/12 1635 500 mL at 09/15/12 1635    Past Medical History  Diagnosis Date  . Hypertension   . Bipolar disorder   . Allergy   . Depression   . Hepatitis C   . Chronic pain   . Panic attack   . Dyspnea       Review of Systems  Constitutional: Negative for fever and chills.  Gastrointestinal: Positive for vomiting, abdominal pain, diarrhea and blood in stool. Negative for constipation.  Genitourinary: Positive for urgency. Negative for frequency,  hematuria, decreased urine volume and difficulty urinating.  Musculoskeletal: Positive for arthralgias, back pain, gait problem and myalgias. Negative for joint swelling.  Skin: Negative for color change and wound.  Neurological: Negative for dizziness, weakness, light-headedness and numbness.  All other systems reviewed and are negative.    BP 120/90  Pulse 68  Temp(Src) 98 F (36.7 C) (Oral)  Resp 18  Ht 5' 6.5" (1.689 m)  Wt 168 lb 6.4 oz (76.386 kg)  BMI 26.78 kg/m2  SpO2 95% Objective:   Physical Exam  Nursing note and vitals reviewed. Constitutional: He is oriented to person, place, and time. He appears well-developed and well-nourished.  Pt changing position constantly during history and exam - unable to stay seated for any sig length of time but when requested upon exam able to sit, lay, and change positions w/o excessive difficulty.  HENT:  Head: Normocephalic and atraumatic.  Eyes: Conjunctivae and EOM are normal. Pupils are equal, round, and reactive to light.  Neck: Normal range of motion. Neck supple. No mass and no thyromegaly present.  Cardiovascular: Normal rate, regular rhythm and normal heart sounds.  Exam reveals no gallop and no friction rub.   No murmur heard. Pulmonary/Chest: Effort normal and breath sounds normal.  Clear to auscultation bilaterally   Abdominal: Soft. Bowel sounds are normal. There is no hepatosplenomegaly. There is generalized tenderness. There is guarding (diffuse). There is no CVA tenderness.  No tenderness over greater trochanters. generalized tenderness  Genitourinary:  No external fissures, hemmerhoids. Normal tone, normal prostate. No stool in vault or gross blood  Musculoskeletal: Normal range of motion. He exhibits tenderness.  Lower thoracic and lower lumbar paraspinal tenderness. No SI joint tenderness. 4/5 hip flexor strength on left, 4+/5 hip strength on right. 5/5 hamstring bilaterally. 5/5 quad strength bilaterally. 5/5 dorsal  and plantar flex bilaterally  Lymphadenopathy:    He has no cervical adenopathy.  Neurological: He is alert and oriented to person, place, and time. No sensory deficit. Gait normal.  Reflex Scores:      Patellar reflexes are 2+ on the right side and 2+ on the left side.      Achilles reflexes are 2+ on the right side and 2+ on the left side. Negative straight leg raise bilaterally. 2+ patellar DTR. 2+ achilles DTR. Normal sensation to light touch bilaterally in lower ext  Skin: Skin is warm and dry.  Psychiatric: He has a normal mood and affect. His speech is normal. He is agitated.   Results  for orders placed in visit on 03/09/13  POCT CBC      Result Value Range   WBC 11.0 (*) 4.6 - 10.2 K/uL   Lymph, poc 3.6 (*) 0.6 - 3.4   POC LYMPH PERCENT 32.5  10 - 50 %L   MID (cbc) 0.5  0 - 0.9   POC MID % 4.2  0 - 12 %M   POC Granulocyte 7.0 (*) 2 - 6.9   Granulocyte percent 63.3  37 - 80 %G   RBC 5.36  4.69 - 6.13 M/uL   Hemoglobin 13.8 (*) 14.1 - 18.1 g/dL   HCT, POC 16.1  09.6 - 53.7 %   MCV 83.3  80 - 97 fL   MCH, POC 25.7 (*) 27 - 31.2 pg   MCHC 30.9 (*) 31.8 - 35.4 g/dL   RDW, POC 04.5     Platelet Count, POC 328  142 - 424 K/uL   MPV 7.6  0 - 99.8 fL  IFOBT (OCCULT BLOOD)      Result Value Range   IFOBT Negative        [facility] reading (PRIMARY) by  Dr. Clelia Croft. T-spine: No acute abnormality L-spine: mild scoliosis, well preserved disc spaces, mild deg change in L5-S1, moderate amount of stool burden and gas in non-specific pattern Assessment & Plan:  Back pain - Plan: POCT CBC, POCT SEDIMENTATION RATE, Comprehensive metabolic panel, DG Lumbar Spine Complete, DG Thoracic Spine 2 View  Diarrhea - Plan: POCT CBC, POCT SEDIMENTATION RATE, IFOBT POC (occult bld, rslt in office), Comprehensive metabolic panel, TSH, Lipase  Abdominal pain, unspecified site - Plan: POCT CBC, POCT SEDIMENTATION RATE, Comprehensive metabolic panel, TSH, Lipase  Idiopathic scoliosis - Plan: Ambulatory  referral to Physical Therapy, Ambulatory referral to Orthopedic Surgery  Lumbar back pain - Plan: Ambulatory referral to Physical Therapy, Ambulatory referral to Orthopedic Surgery - I am somewhat concerned that pt may be doctor shopping considering he was seen at the [facility] for the same complaint just 2 wks ago and instead of returning there or getting a referral he decided to come to our clinic - where he has never been seen prior.  He also is being followed at [facility] on chronic narcotics and did not volunteer this information - was found through the [place] Controlled Substance Database.  Pt's history is also unusual - that he has had a SEVERE inflammation of his chronic back pain over the past 4 mos but insists that this worsening has never been evaluated despite multiple doctor visits and that he has never been seen by any orthopedic, spinal, or neurosurgeon despite his need for large amounts of chronic narcotics. Imaging of L-spine with xray in Feb and MRI in April of this year reviewed with minor abnormalities but as pt states his sxs have significantly worsened since work injury around Sept I do think he needs repeat imaging. Xrays done today and referred to PT as well as ortho eval - wonder if pt would be a candidate for injections? Don't know if they due this at [facility].  If pt does not improve w/ PT in 6 wks, cons repeating MRI.  Thoracic back pain - Plan: Ambulatory referral to Physical Therapy, Ambulatory referral to Orthopedic Surgery  Constipation due to pain medication, initial encounter - I suspect pt's abdominal pain and diarrhea may be due to actual underlying chronic constipation from narcotics - perhaps he is having leakage around some impaction. No stool in rectal vault on  exam today but encouraged pt to start supp bid w/ miralax and then reassess if sxs do not improve.   Meds ordered this encounter  Medications  . polyethylene glycol powder  (GLYCOLAX/MIRALAX) powder    Sig: Take 17 g by mouth 2 (two) times daily as needed for moderate constipation.    Dispense:  255 g    Refill:  2  . simethicone (GAS-X) 80 MG chewable tablet    Sig: Chew 1 tablet (80 mg total) by mouth every 6 (six) hours as needed (cramping abd pain due to gas).    Dispense:  30 tablet    Refill:  2    I personally performed the services described in this documentation, which was scribed in my presence. The recorded information has been reviewed and considered, and addended by me as needed.  Norberto Sorenson, MD MPH

## 2013-03-09 NOTE — Patient Instructions (Signed)

## 2013-03-10 LAB — TSH: TSH: 1.419 u[IU]/mL (ref 0.350–4.500)

## 2013-03-16 ENCOUNTER — Ambulatory Visit: Payer: Medicare Other | Attending: Family Medicine

## 2013-03-16 DIAGNOSIS — M2560 Stiffness of unspecified joint, not elsewhere classified: Secondary | ICD-10-CM | POA: Insufficient documentation

## 2013-03-16 DIAGNOSIS — R5381 Other malaise: Secondary | ICD-10-CM | POA: Insufficient documentation

## 2013-03-16 DIAGNOSIS — R293 Abnormal posture: Secondary | ICD-10-CM | POA: Insufficient documentation

## 2013-03-16 DIAGNOSIS — IMO0001 Reserved for inherently not codable concepts without codable children: Secondary | ICD-10-CM | POA: Insufficient documentation

## 2013-03-16 DIAGNOSIS — M2550 Pain in unspecified joint: Secondary | ICD-10-CM | POA: Insufficient documentation

## 2013-03-22 ENCOUNTER — Ambulatory Visit: Payer: Medicare Other | Admitting: Rehabilitation

## 2013-03-22 ENCOUNTER — Other Ambulatory Visit: Payer: Self-pay | Admitting: Family Medicine

## 2013-03-24 ENCOUNTER — Ambulatory Visit: Payer: Medicare Other | Admitting: Rehabilitation

## 2013-03-27 ENCOUNTER — Telehealth: Payer: Self-pay

## 2013-03-27 NOTE — Telephone Encounter (Signed)
Needs copy of xray of lower back. Please call when ready  [phone]

## 2013-03-29 ENCOUNTER — Ambulatory Visit: Payer: Medicare Other

## 2013-03-29 NOTE — Telephone Encounter (Signed)
Request given to xray 

## 2013-04-08 DIAGNOSIS — K859 Acute pancreatitis without necrosis or infection, unspecified: Secondary | ICD-10-CM

## 2013-04-08 HISTORY — DX: Acute pancreatitis without necrosis or infection, unspecified: K85.90

## 2013-04-19 ENCOUNTER — Other Ambulatory Visit (HOSPITAL_COMMUNITY): Payer: Self-pay | Admitting: Family Medicine

## 2013-04-30 ENCOUNTER — Other Ambulatory Visit: Payer: Self-pay | Admitting: Family Medicine

## 2013-04-30 MED ORDER — ATENOLOL 50 MG PO TABS: 50.0000 mg | ORAL_TABLET | Freq: Every day | ORAL | Status: DC

## 2013-05-16 ENCOUNTER — Other Ambulatory Visit: Payer: Self-pay | Admitting: Family Medicine

## 2013-05-19 NOTE — Telephone Encounter (Signed)
Phone from [facility] regarding pt's omeprazole 20 mg #90.  Pharmacy has not heard anything regarding this medication refill and was calling inquire if it will be filled.

## 2013-05-21 NOTE — Telephone Encounter (Signed)
Received message from [facility] regarding pt's omeprazole 20 mg.  Can you pleas this medication for this pt.  Pt also called needing medication refill.  Derl Barrow, RN

## 2013-05-31 NOTE — Telephone Encounter (Signed)
Pt called about his refill for acid reflux. Please advise

## 2013-06-01 ENCOUNTER — Other Ambulatory Visit: Payer: Self-pay | Admitting: Family Medicine

## 2013-06-01 MED ORDER — OMEPRAZOLE 20 MG PO CPDR: 20.0000 mg | DELAYED_RELEASE_CAPSULE | Freq: Every day | ORAL | Status: DC

## 2013-06-07 ENCOUNTER — Ambulatory Visit: Payer: Medicare Other | Admitting: Family Medicine

## 2013-06-22 ENCOUNTER — Telehealth: Payer: Self-pay | Admitting: Family Medicine

## 2013-06-22 ENCOUNTER — Ambulatory Visit (INDEPENDENT_AMBULATORY_CARE_PROVIDER_SITE_OTHER): Payer: Medicare Other | Admitting: Family Medicine

## 2013-06-22 VITALS — BP 130/74 | HR 86 | Wt 174.0 lb

## 2013-06-22 DIAGNOSIS — K2990 Gastroduodenitis, unspecified, without bleeding: Secondary | ICD-10-CM

## 2013-06-22 DIAGNOSIS — R109 Unspecified abdominal pain: Secondary | ICD-10-CM

## 2013-06-22 DIAGNOSIS — K2970 Gastritis, unspecified, without bleeding: Secondary | ICD-10-CM

## 2013-06-22 DIAGNOSIS — K6289 Other specified diseases of anus and rectum: Secondary | ICD-10-CM | POA: Insufficient documentation

## 2013-06-22 LAB — POCT H PYLORI SCREEN: H Pylori Screen, POC: POSITIVE

## 2013-06-22 MED ORDER — PROMETHAZINE HCL 25 MG PO TABS: 25.0000 mg | ORAL_TABLET | Freq: Three times a day (TID) | ORAL | Status: DC | PRN

## 2013-06-22 NOTE — Telephone Encounter (Signed)
Called patient. Left VM.  He left before being seen. 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 POCT H. Pylori was positive.  I apologized for his wait time. He did arrive 30 mins early was seen 20 mins late (50 mins of wait). Informed patient of positive H. Pylori. Also informed patient that I sent in phenergan.   Plan: Sent in phenergan.  Asked patient to schedule f/u at his convenience, preferably with his PCP to be evaluated and likely treated for H. Pylori associated gastritis.

## 2013-06-22 NOTE — Progress Notes (Signed)
Patient ID: [name], male   DOB: 1956-01-17, 58 y.o.   MRN: [id number] Patient left before being evaluated. See phone note.

## 2013-06-23 ENCOUNTER — Telehealth: Payer: Self-pay | Admitting: Family Medicine

## 2013-06-23 NOTE — Telephone Encounter (Signed)
Pt wants to apologize for walking out yesterday

## 2013-06-23 NOTE — Telephone Encounter (Signed)
Accepted. No offense or hard feelings.  Please thank patient for apologized and facilitate PCP f/u.

## 2013-06-23 NOTE — Telephone Encounter (Signed)
LMOVM for pt to return call.  Please see below message and have pt schedule appt with PCP [place], Salome Spotted

## 2013-08-13 ENCOUNTER — Ambulatory Visit: Payer: Medicare Other | Admitting: Emergency Medicine

## 2013-08-13 ENCOUNTER — Other Ambulatory Visit: Payer: Self-pay | Admitting: Emergency Medicine

## 2013-08-16 ENCOUNTER — Ambulatory Visit: Payer: Medicare Other | Admitting: Family Medicine

## 2013-08-27 ENCOUNTER — Emergency Department (HOSPITAL_COMMUNITY): Payer: Medicare Other

## 2013-08-27 ENCOUNTER — Inpatient Hospital Stay (HOSPITAL_COMMUNITY)
Admission: EM | Admit: 2013-08-27 | Discharge: 2013-09-03 | DRG: 440 | Disposition: A | Discharge status: Home / Self Care | Payer: Medicare Other | Attending: Family Medicine | Admitting: Family Medicine

## 2013-08-27 ENCOUNTER — Encounter (HOSPITAL_COMMUNITY): Payer: Self-pay | Admitting: Emergency Medicine

## 2013-08-27 ENCOUNTER — Emergency Department (HOSPITAL_COMMUNITY)
Admission: EM | Admit: 2013-08-27 | Discharge: 2013-08-27 | Disposition: A | Discharge status: Home / Self Care | Payer: Medicare Other | Source: Home / Self Care | Attending: Emergency Medicine | Admitting: Emergency Medicine

## 2013-08-27 DIAGNOSIS — I10 Essential (primary) hypertension: Secondary | ICD-10-CM | POA: Diagnosis present

## 2013-08-27 DIAGNOSIS — M549 Dorsalgia, unspecified: Secondary | ICD-10-CM | POA: Insufficient documentation

## 2013-08-27 DIAGNOSIS — F319 Bipolar disorder, unspecified: Secondary | ICD-10-CM

## 2013-08-27 DIAGNOSIS — F410 Panic disorder [episodic paroxysmal anxiety] without agoraphobia: Secondary | ICD-10-CM | POA: Insufficient documentation

## 2013-08-27 DIAGNOSIS — D638 Anemia in other chronic diseases classified elsewhere: Secondary | ICD-10-CM | POA: Diagnosis present

## 2013-08-27 DIAGNOSIS — Z8619 Personal history of other infectious and parasitic diseases: Secondary | ICD-10-CM

## 2013-08-27 DIAGNOSIS — G4733 Obstructive sleep apnea (adult) (pediatric): Secondary | ICD-10-CM | POA: Diagnosis present

## 2013-08-27 DIAGNOSIS — Z7982 Long term (current) use of aspirin: Secondary | ICD-10-CM

## 2013-08-27 DIAGNOSIS — B1710 Acute hepatitis C without hepatic coma: Secondary | ICD-10-CM | POA: Diagnosis present

## 2013-08-27 DIAGNOSIS — Z9889 Other specified postprocedural states: Secondary | ICD-10-CM

## 2013-08-27 DIAGNOSIS — M545 Low back pain, unspecified: Secondary | ICD-10-CM | POA: Diagnosis present

## 2013-08-27 DIAGNOSIS — F411 Generalized anxiety disorder: Secondary | ICD-10-CM | POA: Diagnosis present

## 2013-08-27 DIAGNOSIS — K7689 Other specified diseases of liver: Secondary | ICD-10-CM | POA: Diagnosis present

## 2013-08-27 DIAGNOSIS — B1920 Unspecified viral hepatitis C without hepatic coma: Secondary | ICD-10-CM | POA: Diagnosis present

## 2013-08-27 DIAGNOSIS — R079 Chest pain, unspecified: Secondary | ICD-10-CM | POA: Insufficient documentation

## 2013-08-27 DIAGNOSIS — Z87891 Personal history of nicotine dependence: Secondary | ICD-10-CM | POA: Insufficient documentation

## 2013-08-27 DIAGNOSIS — G8929 Other chronic pain: Secondary | ICD-10-CM | POA: Insufficient documentation

## 2013-08-27 DIAGNOSIS — E876 Hypokalemia: Secondary | ICD-10-CM | POA: Diagnosis present

## 2013-08-27 DIAGNOSIS — D649 Anemia, unspecified: Secondary | ICD-10-CM

## 2013-08-27 DIAGNOSIS — F1010 Alcohol abuse, uncomplicated: Secondary | ICD-10-CM | POA: Diagnosis present

## 2013-08-27 DIAGNOSIS — R0902 Hypoxemia: Secondary | ICD-10-CM | POA: Diagnosis present

## 2013-08-27 DIAGNOSIS — G894 Chronic pain syndrome: Secondary | ICD-10-CM | POA: Diagnosis present

## 2013-08-27 DIAGNOSIS — F1120 Opioid dependence, uncomplicated: Secondary | ICD-10-CM | POA: Diagnosis present

## 2013-08-27 DIAGNOSIS — K859 Acute pancreatitis without necrosis or infection, unspecified: Principal | ICD-10-CM

## 2013-08-27 DIAGNOSIS — Z79899 Other long term (current) drug therapy: Secondary | ICD-10-CM

## 2013-08-27 DIAGNOSIS — R0600 Dyspnea, unspecified: Secondary | ICD-10-CM

## 2013-08-27 LAB — TROPONIN I: Troponin I: <0.3 ng/mL (ref <0.30)

## 2013-08-27 LAB — CBC
HCT: 41.1 % (ref 39.0–52.0)
Hemoglobin: 13.9 g/dL (ref 13.0–17.0)
MCH: 26.3 pg (ref 26.0–34.0)
MCHC: 33.8 g/dL (ref 30.0–36.0)
MCV: 77.7 fL — ABNORMAL LOW (ref 78.0–100.0)
Platelets: 247 10*3/uL (ref 150–400)
RBC: 5.29 MIL/uL (ref 4.22–5.81)
RDW: 14.2 % (ref 11.5–15.5)
WBC: 16.7 10*3/uL — ABNORMAL HIGH (ref 4.0–10.5)

## 2013-08-27 LAB — BASIC METABOLIC PANEL
BUN: 13 mg/dL (ref 6–23)
CO2: 25 mEq/L (ref 19–32)
Calcium: 10.1 mg/dL (ref 8.4–10.5)
Chloride: 102 mEq/L (ref 96–112)
Creatinine, Ser: 0.8 mg/dL (ref 0.50–1.35)
GFR calc Af Amer: >90 mL/min (ref >90)
GFR calc non Af Amer: >90 mL/min (ref >90)
Glucose, Bld: 130 mg/dL — ABNORMAL HIGH (ref 70–99)
Potassium: 4.2 mEq/L (ref 3.7–5.3)
Sodium: 142 mEq/L (ref 137–147)

## 2013-08-27 LAB — CBC WITH DIFFERENTIAL/PLATELET
Basophils Absolute: 0 10*3/uL (ref 0.0–0.1)
Basophils Relative: 0 % (ref 0–1)
Eosinophils Absolute: 0 10*3/uL (ref 0.0–0.7)
Eosinophils Relative: 0 % (ref 0–5)
HCT: 42.5 % (ref 39.0–52.0)
Hemoglobin: 14.7 g/dL (ref 13.0–17.0)
Lymphocytes Relative: 16 % (ref 12–46)
Lymphs Abs: 3.5 10*3/uL (ref 0.7–4.0)
MCH: 27 pg (ref 26.0–34.0)
MCHC: 34.6 g/dL (ref 30.0–36.0)
MCV: 78.1 fL (ref 78.0–100.0)
Monocytes Absolute: 1.5 10*3/uL — ABNORMAL HIGH (ref 0.1–1.0)
Monocytes Relative: 7 % (ref 3–12)
Neutro Abs: 17.8 10*3/uL — ABNORMAL HIGH (ref 1.7–7.7)
Neutrophils Relative %: 77 % (ref 43–77)
Platelets: 265 10*3/uL (ref 150–400)
RBC: 5.44 MIL/uL (ref 4.22–5.81)
RDW: 14.3 % (ref 11.5–15.5)
WBC: 22.8 10*3/uL — ABNORMAL HIGH (ref 4.0–10.5)

## 2013-08-27 LAB — COMPREHENSIVE METABOLIC PANEL
ALT: 28 U/L (ref 0–53)
AST: 27 U/L (ref 0–37)
Albumin: 4.2 g/dL (ref 3.5–5.2)
Alkaline Phosphatase: 70 U/L (ref 39–117)
BUN: 17 mg/dL (ref 6–23)
CO2: 23 mEq/L (ref 19–32)
Calcium: 9.5 mg/dL (ref 8.4–10.5)
Chloride: 101 mEq/L (ref 96–112)
Creatinine, Ser: 0.8 mg/dL (ref 0.50–1.35)
GFR calc Af Amer: >90 mL/min (ref >90)
GFR calc non Af Amer: >90 mL/min (ref >90)
Glucose, Bld: 129 mg/dL — ABNORMAL HIGH (ref 70–99)
Potassium: 4.3 mEq/L (ref 3.7–5.3)
Sodium: 138 mEq/L (ref 137–147)
Total Bilirubin: 0.5 mg/dL (ref 0.3–1.2)
Total Protein: 7.5 g/dL (ref 6.0–8.3)

## 2013-08-27 LAB — I-STAT TROPONIN, ED: Troponin i, poc: 0 ng/mL (ref 0.00–0.08)

## 2013-08-27 LAB — TRIGLYCERIDES: Triglycerides: 96 mg/dL (ref <150)

## 2013-08-27 LAB — LIPASE, BLOOD: Lipase: 1317 U/L — ABNORMAL HIGH (ref 11–59)

## 2013-08-27 MED ORDER — SODIUM CHLORIDE 0.9 % IV BOLUS (SEPSIS)
1000.0000 mL | Freq: Once | INTRAVENOUS | Status: AC
  Administered 2013-08-27: 1000 mL via INTRAVENOUS

## 2013-08-27 MED ORDER — HYDROMORPHONE HCL PF 1 MG/ML IJ SOLN
1.0000 mg | Freq: Once | INTRAMUSCULAR | Status: AC
  Administered 2013-08-27: 1 mg via INTRAVENOUS
  Filled 2013-08-27: qty 1

## 2013-08-27 MED ORDER — IOHEXOL 300 MG/ML  SOLN: 25.0000 mL | Freq: Once | INTRAMUSCULAR | Status: AC | PRN

## 2013-08-27 MED ORDER — SODIUM CHLORIDE 0.9 % IV SOLN
INTRAVENOUS | Status: DC
  Administered 2013-08-28: 01:00:00 via INTRAVENOUS

## 2013-08-27 MED ORDER — ONDANSETRON HCL 4 MG/2ML IJ SOLN
4.0000 mg | Freq: Once | INTRAMUSCULAR | Status: AC
  Administered 2013-08-27: 4 mg via INTRAVENOUS
  Filled 2013-08-27: qty 2

## 2013-08-27 MED ORDER — HYDROMORPHONE HCL PF 1 MG/ML IJ SOLN
1.0000 mg | INTRAMUSCULAR | Status: AC | PRN
  Administered 2013-08-27 – 2013-08-28 (×3): 1 mg via INTRAVENOUS
  Filled 2013-08-27 (×3): qty 1

## 2013-08-27 MED ORDER — IOHEXOL 300 MG/ML  SOLN
80.0000 mL | Freq: Once | INTRAMUSCULAR | Status: AC | PRN
  Administered 2013-08-27: 80 mL via INTRAVENOUS

## 2013-08-27 MED ORDER — ONDANSETRON 4 MG PO TBDP
8.0000 mg | ORAL_TABLET | Freq: Once | ORAL | Status: AC
  Administered 2013-08-27: 8 mg via ORAL
  Filled 2013-08-27: qty 2

## 2013-08-27 NOTE — ED Notes (Signed)
Pt states he was having CP this morning that has progressed into ABD pain.  Pt states talking a lot of motrin and BC powders recently in an attempt to get off narcotic pain medications.  Pt states high BP last night

## 2013-08-27 NOTE — H&P (Signed)
[facility] Admission History and Physical Service Pager: [phone]  Patient name: [name] Medical record number: [id number] Date of birth: [birth year] Age: 58 y.o. Gender: male  Primary Care Provider: Phill Myron, MD Consultants: None  Code Status: Full   Chief Complaint: nausea, abdominal pain   Assessment and Plan: [name] is a 58 y.o. male presenting with acute pancreatitis . PMH is significant for Hepatitis C, alcohol abuse, Bipolar Disorder, HTN.   #Acute Pancreatitis - Pt denies any recent alcohol use (remote history several yrs ago) and no evidence of CBD dilation or gallbladder inflammation on CT scan.  - Admit to med-surg, vitals per unit - NPO, NS @ 150 cc/hr s/p 1 L bolus - Dilaudid 1 mg IV q 2 hrs PRN, documented allergy to morphine and tolerates dilaudid w/o SE per pt and received dose in ED - Advance diet as tolerates, at which point would stop his narcotics  - Repeat CMET and CBC in AM  - Zofran PRN for nausea/vomiting   # Acute Leukocytosis - Most likely 2/2 his pancreatitis.  CXR no infiltrate, will obtain UA in pt to r/o UTI - Repeat CBC in AM   #Hx of Polysubstance Abuse/ Chronic Opioid Dependence   - Pt with significant substance abuse history  - As well, has been on narcotics for several yrs 2/2 low back pain and has tried weaning himself off this in the past several months  - UDS to make sure no recent ingestions  #Hx of Hepatitis C - LFT's stable, repeat in AM  #Hx of Bipolar Disorder/anxiety disorder - Hold home lamictal until tolerating PO  - Ativan IV PRN for anxiety   #Hx of Alcohol Abuse - Pt states last drink several years ago (1987 with 3 days of heavy alcohol use around 14 yrs ago)  - Will place on CIWA in case pt relapsed causing acute pancreatitis   FEN/GI: NPO, NS @ 150 cc/hr Prophylaxis: SQ heparin  Disposition: Pending further improvement   History of Present Illness: [name] is a 58 y.o. male  presenting with acute pancreatitis.  Pt states this has been ongoing for the last 1-2 days, beginning as epigastric pain, preventing him from going to sleep earlier this AM.  Stated he had this burning pain that felt like reflux and eventually went to [facility] ED earlier today where he had a chest pain rule out exam.  EKG was unchanged from previous showing NSR and troponin was negative at that time and he had previous ETT in 2014 June which was normal.  He was sent home and told to follow up with [facility] int he next week.  However, pt started to have some worsening epigastric pain with nausea and non bloody non bilious vomiting, which ended up bringing him back to the ED today.    At this time, he had lipase drawn and elevated to 1317, CBC showing leukocytosis to 22 w/ L shift, CMP basically normal, CXR not showing acute process, and CT abdomen showing acute pancreatitis and alcoholic steatosis.  He was given dilaudid along with 1 L NS at that time which helped improve his pain.    Pt has significant substance, alcohol, and cigarette history.  He states he "did every drug known to man" up until 1987 along with drinking a case of beer up until that time, when he tried to improve his life.  He also smoked 1 ppd for 20-25 years, up until 7 yrs ago.  Pt did  not have any drug or alcohol use up until about 14 yrs ago, when he had a 3 day stint of alcohol and substance use at that time and since states he has been clean.  However, he has been on chronic narcotics now for several years due to intractable low back pain and has tried weaning himself off his OxyContin and MS contin over the last several months.  Due to this, he states he has been having withdrawal symptoms, that have not seemed especially worse over the past week or so including diarrhea, abdominal pain, fever, chills, sweats and attributed all of these to his withdrawal.  He denies specifically any substernal chest pressure, PND, orthopnea, blurred vision,  weakness, dysuria, productive cough or shortness of breath, CVA tenderness, bilious vomiting or hematemesis.    As well denies any recent travel, recent changes in diet, recent sick contacts.   Review Of Systems: Per HPI   Patient Active Problem List   Diagnosis Date Noted  . Acute pancreatitis 08/27/2013  . Abdominal pain 06/22/2013  . Lightheadedness 11/01/2012  . Unstable angina 09/15/2012  . Exertional chest pain 09/07/2012  . Dyspnea on exertion 09/07/2012  . Memory impairment 12/25/2011  . Dyspnea 10/17/2011  . Screening for prostate cancer 04/24/2011  . Constipation 04/24/2011  . GERD (gastroesophageal reflux disease) 12/18/2010  . Skin lesion of face 07/30/2010  . OBSTRUCTIVE SLEEP APNEA 09/20/2009  . HYPERTENSION, BENIGN 10/19/2008  . VERTIGO 04/05/2008  . RHINITIS 05/13/2007  . HEPATITIS C 06/05/2006  . DEPRESSION, MAJOR, RECURRENT 06/05/2006  . BIPOLAR DISORDER 06/05/2006  . ANXIETY 06/05/2006  . Alcohol abuse, unspecified 06/05/2006  . BACK PAIN, LOW 06/05/2006   Past Medical History: Past Medical History  Diagnosis Date  . Hypertension   . Bipolar disorder   . Allergy   . Depression   . Hepatitis C   . Chronic pain   . Panic attack   . Dyspnea    Past Surgical History: Past Surgical History  Procedure Laterality Date  . Tonsillectomy    . Cardiac catheterization  2003    No CAD  . Sinus surgery     Social History: History  Substance Use Topics  . Smoking status: Former Smoker -- 2.00 packs/day for 16 years    Types: Cigarettes    Quit date: 04/08/2002  . Smokeless tobacco: Never Used  . Alcohol Use: No     Comment: Quit in 37   Family History: No family history on file. Allergies and Medications: Allergies  Allergen Reactions  . Morphine And Related Shortness Of Breath   Current Facility-Administered Medications on File Prior to Encounter  Medication Dose Route Frequency Provider Last Rate Last Dose  . 0.9 %  sodium chloride  infusion   Intravenous Continuous Montez Morita, MD 20 mL/hr at 09/15/12 1635 500 mL at 09/15/12 1635   Current Outpatient Prescriptions on File Prior to Encounter  Medication Sig Dispense Refill  . aspirin EC 81 MG tablet Take 81 mg by mouth daily.       . Melatonin 10 MG CAPS Take 10 mg by mouth at bedtime as needed (sleep).       . Multiple Vitamin (MULTIVITAMIN WITH MINERALS) TABS Take 1 tablet by mouth daily.       . promethazine (PHENERGAN) 25 MG tablet Take 1 tablet (25 mg total) by mouth every 8 (eight) hours as needed for nausea or vomiting.  20 tablet  0    Objective: BP 149/96  Pulse 66  Temp(Src) 97.8 F (36.6 C) (Oral)  Resp 18  Ht 5\' 7"  (1.702 m)  Wt 168 lb 1.6 oz (76.25 kg)  BMI 26.32 kg/m2  SpO2 93% Exam: General: NAD, laying flat with knees drawn in bed  HEENT: [place]/AT, MMM, EOMI B/L, Miotic reactive pupils B/L  Neck: No JVD Cardiovascular: RRR, no murmurs appreciated  Respiratory: CTAB Abdomen: Soft, no rigidity, mild guarding diffuse, no rebound.  + TTP epigastric region > RUQ/LUQ, no suprapubic/CVA/LLQ/RLQ TTP. No palpable HSM  Extremities: No edema B/L + 2 pulses B/L LE  Skin: No rashes Neuro: No focal deficits, CN 2-12 intact, AAO x 3  Labs and Imaging: CBC BMET   Recent Labs Lab 08/27/13 1900  WBC 22.8*  HGB 14.7  HCT 42.5  PLT 265   Lipase     Component Value Date/Time   LIPASE 1317* 08/27/2013 1904     Recent Labs Lab 08/27/13 1904  NA 138  K 4.3  CL 101  CO2 23  BUN 17  CREATININE 0.80  GLUCOSE 129*  CALCIUM 9.5     CT abdomen/Pelvis -  IMPRESSION:  1. Findings suggestive of acute pancreatitis. Correlation with serum  lipase recommended. No loculated fluid collection or evidence of  pancreatic necrosis.  2. No other acute intra-abdominal or pelvic abnormality.  3. Hepatic steatosis.  4. 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  Samuel Rod, DO 08/27/2013, 10:43 PM PGY-2, [place] [facility] pager: [phone], text pages welcome

## 2013-08-27 NOTE — ED Notes (Signed)
Pt requested a "clonidine" for HTN. Request was made to Dr Eulis Foster who refused because he can't follow pt and stated to inform pt to contact his PCP to get meds adjusted. PT verbalized understanding

## 2013-08-27 NOTE — ED Notes (Signed)
Pt states that last pm he took 1/2 xanax, 4 baby asa, 2 atenolol. This am pt took another 1/2 xanax, 1 500 mg oxycodone with no relief.

## 2013-08-27 NOTE — ED Notes (Signed)
Pt reports CP since last pm. Pt states that he has had CP with anxiety before but this "is different." Pt states that he just "came off pain medicine" this month, 6 oxycontin, MS contin for over 20 years.Pt states that he has stopped taking meds all together.  Pt states that he has mild nausea and mild SOB. Pt is A&O and in NAD.

## 2013-08-27 NOTE — Discharge Instructions (Signed)
Chest Pain (Nonspecific) °It is often hard to give a specific diagnosis for the cause of chest pain. There is always a chance that your pain could be related to something serious, such as a heart attack or a blood clot in the lungs. You need to follow up with your caregiver for further evaluation. °CAUSES  °· Heartburn. °· Pneumonia or bronchitis. °· Anxiety or stress. °· Inflammation around your heart (pericarditis) or lung (pleuritis or pleurisy). °· A blood clot in the lung. °· A collapsed lung (pneumothorax). It can develop suddenly on its own (spontaneous pneumothorax) or from injury (trauma) to the chest. °· Shingles infection (herpes zoster virus). °The chest wall is composed of bones, muscles, and cartilage. Any of these can be the source of the pain. °· The bones can be bruised by injury. °· The muscles or cartilage can be strained by coughing or overwork. °· The cartilage can be affected by inflammation and become sore (costochondritis). °DIAGNOSIS  °Lab tests or other studies, such as X-rays, electrocardiography, stress testing, or cardiac imaging, may be needed to find the cause of your pain.  °TREATMENT  °· Treatment depends on what may be causing your chest pain. Treatment may include: °· Acid blockers for heartburn. °· Anti-inflammatory medicine. °· Pain medicine for inflammatory conditions. °· Antibiotics if an infection is present. °· You may be advised to change lifestyle habits. This includes stopping smoking and avoiding alcohol, caffeine, and chocolate. °· You may be advised to keep your head raised (elevated) when sleeping. This reduces the chance of acid going backward from your stomach into your esophagus. °· Most of the time, nonspecific chest pain will improve within 2 to 3 days with rest and mild pain medicine. °HOME CARE INSTRUCTIONS  °· If antibiotics were prescribed, take your antibiotics as directed. Finish them even if you start to feel better. °· For the next few days, avoid physical  activities that bring on chest pain. Continue physical activities as directed. °· Do not smoke. °· Avoid drinking alcohol. °· Only take over-the-counter or prescription medicine for pain, discomfort, or fever as directed by your caregiver. °· Follow your caregiver's suggestions for further testing if your chest pain does not go away. °· Keep any follow-up appointments you made. If you do not go to an appointment, you could develop lasting (chronic) problems with pain. If there is any problem keeping an appointment, you must call to reschedule. °SEEK MEDICAL CARE IF:  °· You think you are having problems from the medicine you are taking. Read your medicine instructions carefully. °· Your chest pain does not go away, even after treatment. °· You develop a rash with blisters on your chest. °SEEK IMMEDIATE MEDICAL CARE IF:  °· You have increased chest pain or pain that spreads to your arm, neck, jaw, back, or abdomen. °· You develop shortness of breath, an increasing cough, or you are coughing up blood. °· You have severe back or abdominal pain, feel nauseous, or vomit. °· You develop severe weakness, fainting, or chills. °· You have a fever. °THIS IS AN EMERGENCY. Do not wait to see if the pain will go away. Get medical help at once. Call your local emergency services (911 in [place] Do not drive yourself to the hospital. °MAKE SURE YOU:  °· Understand these instructions. °· Will watch your condition. °· Will get help right away if you are not doing well or get worse. °Document Released: 01/02/2005 Document Revised: 06/17/2011 Document Reviewed: 10/29/2007 °[employer]® Patient Information ©2014 [employer]   [employer].  Chronic Back Pain  When back pain lasts longer than 3 months, it is called chronic back pain.People with chronic back pain often go through certain periods that are more intense (flare-ups).  CAUSES Chronic back pain can be caused by wear and tear (degeneration) on different structures in your back. These  structures include:  The bones of your spine (vertebrae) and the joints surrounding your spinal cord and nerve roots (facets).  The strong, fibrous tissues that connect your vertebrae (ligaments). Degeneration of these structures may result in pressure on your nerves. This can lead to constant pain. HOME CARE INSTRUCTIONS  Avoid bending, heavy lifting, prolonged sitting, and activities which make the problem worse.  Take brief periods of rest throughout the day to reduce your pain. Lying down or standing usually is better than sitting while you are resting.  Take over-the-counter or prescription medicines only as directed by your caregiver. SEEK IMMEDIATE MEDICAL CARE IF:   You have weakness or numbness in one of your legs or feet.  You have trouble controlling your bladder or bowels.  You have nausea, vomiting, abdominal pain, shortness of breath, or fainting. Document Released: 05/02/2004 Document Revised: 06/17/2011 Document Reviewed: 03/09/2011 [facility] Patient Information 2014 [place], [place].

## 2013-08-27 NOTE — ED Notes (Signed)
Notified Dr. Eulis Foster that pt pain has not resolved and is radiating to back. EDP to see pt in room

## 2013-08-27 NOTE — ED Notes (Signed)
Patient transported to CT 

## 2013-08-27 NOTE — ED Notes (Signed)
Pt states last night med chest pain started, states he is recently coming off of pain pills. Pt states last night he took 2 of his BP meds due to BP being high and 4 baby aspirin and 1 oxycodone. Pt states he is nauseous as well.

## 2013-08-27 NOTE — ED Provider Notes (Signed)
CSN: [id number]     Arrival date & time 08/27/13  0086 History   First MD Initiated Contact with Patient 08/27/13 [phone]     Chief Complaint  Patient presents with  . Chest Pain     (Consider location/radiation/quality/duration/timing/severity/associated sxs/prior Treatment) Patient is a 58 y.o. male presenting with chest pain. The history is provided by the patient.  Chest Pain  He developed chest pain yesterday. The pain is sharp, mid, chest, constant, and variable in intensity. Currently in the ED, it is 3/10. The pain has been 5/10 at worst. He could not sleep last night because of the pain. He notes his blood pressure was elevated, so he took 2 atenolol approximately 3 AM, along with the Xanax, and aspirin. After that his blood pressure was 160/100. He denies shortness of breath, cough, nausea, vomiting, weakness, dizziness, diaphoresis, or change in his chronic back pain. He's been trying to wean himself off methadone, using oxycodone. He, states he discussed this plan with his pain management doctor, 2 weeks ago. He's never had any cardiac disease. He does not smoke. There is no family history early myocardial infarction. He, states he had a stress test many years ago. There are no other known modifying factors.  Past Medical History  Diagnosis Date  . Hypertension   . Bipolar disorder   . Allergy   . Depression   . Hepatitis C   . Chronic pain   . Panic attack   . Dyspnea    Past Surgical History  Procedure Laterality Date  . Tonsillectomy    . Cardiac catheterization  2003    No CAD  . Sinus surgery     No family history on file. History  Substance Use Topics  . Smoking status: Former Smoker -- 2.00 packs/day for 16 years    Types: Cigarettes    Quit date: 04/08/2002  . Smokeless tobacco: Never Used  . Alcohol Use: No     Comment: Quit in 1987    Review of Systems  Cardiovascular: Positive for chest pain.  All other systems reviewed and are  negative.     Allergies  Morphine and related  Home Medications   Prior to Admission medications   Medication Sig Start Date End Date Taking? Authorizing Provider  ALPRAZolam Duanne Moron) 1 MG tablet Take 0.5 mg by mouth 3 (three) times daily as needed for anxiety.  10/29/11   Historical Provider, MD  aspirin EC 81 MG tablet Take 81 mg by mouth every morning.    Historical Provider, MD  atenolol (TENORMIN) 50 MG tablet Take 1 tablet (50 mg total) by mouth daily. 04/30/13   Phill Myron, MD  cyclobenzaprine (FLEXERIL) 5 MG tablet Take 1 tablet (5 mg total) by mouth 3 (three) times daily as needed for muscle spasms. 02/16/13   Waldemar Dickens, MD  Melatonin 10 MG CAPS Take 1 capsule by mouth at bedtime as needed.    Historical Provider, MD  mirtazapine (REMERON) 45 MG tablet Take 45 mg by mouth at bedtime.    Historical Provider, MD  morphine (MS CONTIN) 30 MG 12 hr tablet Take 30 mg by mouth every morning.  08/11/12   Historical Provider, MD  Multiple Vitamin (MULTIVITAMIN WITH MINERALS) TABS Take 1 tablet by mouth every morning.    Historical Provider, MD  omeprazole (PRILOSEC) 20 MG capsule Take 1 capsule (20 mg total) by mouth daily. 06/01/13   Phill Myron, MD  oxyCODONE (OXYCONTIN) 10 MG 12 hr tablet Take 10 mg  by mouth every 6 (six) hours as needed for pain.    Historical Provider, MD  oxyCODONE-acetaminophen (PERCOCET) 10-325 MG per tablet Take 1 tablet by mouth every 6 (six) hours as needed for pain.    Historical Provider, MD  polyethylene glycol powder (GLYCOLAX/MIRALAX) powder Take 17 g by mouth 2 (two) times daily as needed for moderate constipation. 03/09/13   Shawnee Knapp, MD  promethazine (PHENERGAN) 25 MG tablet Take 1 tablet (25 mg total) by mouth every 8 (eight) hours as needed for nausea or vomiting. 06/22/13   Minerva Ends, MD  simethicone (GAS-X) 80 MG chewable tablet Chew 1 tablet (80 mg total) by mouth every 6 (six) hours as needed (cramping abd pain due to gas). 03/09/13   Shawnee Knapp, MD  simvastatin (ZOCOR) 20 MG tablet TAKE 1 TABLET (20 MG TOTAL) BY MOUTH DAILY AT 6 PM. 04/19/13   Phill Myron, MD  tiotropium (SPIRIVA) 18 MCG inhalation capsule Place 18 mcg into inhaler and inhale daily.    Historical Provider, MD   BP 149/86  Pulse 57  Temp(Src) 98 F (36.7 C) (Oral)  Resp 16  SpO2 97% Physical Exam  Nursing note and vitals reviewed. Constitutional: He is oriented to person, place, and time. He appears well-developed and well-nourished.  HENT:  Head: Normocephalic and atraumatic.  Right Ear: External ear normal.  Left Ear: External ear normal.  Eyes: Conjunctivae and EOM are normal. Pupils are equal, round, and reactive to light.  Neck: Normal range of motion and phonation normal. Neck supple.  Cardiovascular: Normal rate, regular rhythm, normal heart sounds and intact distal pulses.   Pulmonary/Chest: Effort normal and breath sounds normal. He exhibits no bony tenderness.  Abdominal: Soft. There is no tenderness.  Musculoskeletal: Normal range of motion.  Neurological: He is alert and oriented to person, place, and time. No cranial nerve deficit or sensory deficit. He exhibits normal muscle tone. Coordination normal.  Skin: Skin is warm, dry and intact.  Psychiatric: His behavior is normal. Judgment and thought content normal.  Appears depressed.    ED Course  Procedures (including critical care time)  Medications  HYDROmorphone (DILAUDID) injection 1 mg (1 mg Intravenous Given 08/27/13 1233)  ondansetron (ZOFRAN) injection 4 mg (4 mg Intravenous Given 08/27/13 1233)    Patient Vitals for the past 24 hrs:  BP Temp Temp src Pulse Resp SpO2  08/27/13 1400 149/86 mmHg - - 57 - 97 %  08/27/13 1345 - - - 76 - 93 %  08/27/13 1330 - - - 72 - 96 %  08/27/13 1315 - - - 67 - 90 %  08/27/13 1300 146/88 mmHg - - 57 - 95 %  08/27/13 1240 147/102 mmHg - - 72 - 92 %  08/27/13 1220 160/104 mmHg - - 55 - 98 %  08/27/13 1200 161/97 mmHg - - 58 - 98 %  08/27/13  1140 161/91 mmHg - - 57 - 97 %  08/27/13 1130 - - - 58 - 97 %  08/27/13 1120 164/96 mmHg - - 57 - 98 %  08/27/13 1100 161/110 mmHg - - 58 - 98 %  08/27/13 1045 - - - 58 - 98 %  08/27/13 1030 - - - 65 - 97 %  08/27/13 1020 150/135 mmHg - - 58 - 97 %  08/27/13 1015 - - - 56 - 97 %  08/27/13 1000 164/95 mmHg - - 60 - 97 %  08/27/13 0945 - - - 58 - 97 %  08/27/13 0940 165/103 mmHg - - 58 - 97 %  08/27/13 0930 - - - 60 - 96 %  08/27/13 0915 - - - 59 - 96 %  08/27/13 0900 146/95 mmHg - - 75 - 97 %  08/27/13 0830 134/93 mmHg 98 F (36.7 C) Oral 71 16 98 %    12:15 PM Reevaluation with update and discussion. After initial assessment and treatment, an updated evaluation reveals he is uncomfortable with CP, medication ordered.Richarda Blade   12:50- case discussed with his primary care service, they feel that he is at low risk and can be discharged with outpatient management. Dr. Awanda Mink states that the patient had a reassuring cardiac stress test done last year. He, states that the patient can be seen in the office next week, for a checkup.  14:15- he is comfortable, now. Findings discussed with patient, all questions answered.  Labs Review Labs Reviewed  CBC - Abnormal; Notable for the following:    WBC 16.7 (*)    MCV 77.7 (*)    All other components within normal limits  BASIC METABOLIC PANEL - Abnormal; Notable for the following:    Glucose, Bld 130 (*)    All other components within normal limits  TROPONIN I    Imaging Review Dg Chest Port 1 View  08/27/2013   CLINICAL DATA:  Mid sternal chest pain, history of hypertension  EXAM: PORTABLE CHEST - 1 VIEW  COMPARISON:  None.  FINDINGS: The heart size and mediastinal contours are within normal limits. Both lungs are clear. The visualized skeletal structures are unremarkable. Patient is rotated to the right.  IMPRESSION: No active disease.   Electronically Signed   By: Conchita Paris M.D.   On: 08/27/2013 13:22      Date: 08/27/13   Rate: 66  Rhythm: normal sinus rhythm  QRS Axis: normal  PR and QT Intervals: normal  ST/T Wave abnormalities: nonspecific T wave changes  PR and QRS Conduction Disutrbances:left posterior fascicular block  Narrative Interpretation:   Old EKG Reviewed: unchanged- 11/01/12    EKG Interpretation None      MDM   Final diagnoses:  Chest pain  Chronic back pain    Nonspecific ongoing chest pain, without evidence for cardiac ischemia, cardiac infarct or pulmonary abnormality. He is low risk for cardiac chest pain.   Nursing Notes Reviewed/ Care Coordinated Applicable Imaging Reviewed Interpretation of Laboratory Data incorporated into ED treatment  The patient appears reasonably screened and/or stabilized for discharge and I doubt any other medical condition or other [facility] requiring further screening, evaluation, or treatment in the ED at this time prior to discharge.  Plan: Home Medications- usual; Home Treatments- rest; return here if the recommended treatment, does not improve the symptoms; Recommended follow up- PCP 1 week    Richarda Blade, MD 08/27/13 1424

## 2013-08-27 NOTE — ED Provider Notes (Signed)
CSN: [id number]     Arrival date & time 08/27/13  1853 History   First MD Initiated Contact with Patient 08/27/13 2000     Chief Complaint  Patient presents with  . Abdominal Pain     (Consider location/radiation/quality/duration/timing/severity/associated sxs/prior Treatment) Patient is a 58 y.o. male presenting with abdominal pain. The history is provided by the patient.  Abdominal Pain Pain location:  Epigastric Pain quality: aching and sharp   Pain radiates to:  Back Pain severity:  Moderate Onset quality:  Sudden Duration:  1 day Timing:  Constant Progression:  Worsening Chronicity:  New Context: not alcohol use, not previous surgeries, not retching, not sick contacts and not trauma   Relieved by:  Nothing Worsened by:  Nothing tried Ineffective treatments:  None tried Associated symptoms: diarrhea, fatigue, nausea and vomiting   Associated symptoms: no cough, no fever and no shortness of breath   Risk factors: no alcohol abuse     Past Medical History  Diagnosis Date  . Hypertension   . Bipolar disorder   . Allergy   . Depression   . Hepatitis C   . Chronic pain   . Panic attack   . Dyspnea    Past Surgical History  Procedure Laterality Date  . Tonsillectomy    . Cardiac catheterization  2003    No CAD  . Sinus surgery     No family history on file. History  Substance Use Topics  . Smoking status: Former Smoker -- 2.00 packs/day for 16 years    Types: Cigarettes    Quit date: 04/08/2002  . Smokeless tobacco: Never Used  . Alcohol Use: No     Comment: Quit in 1987    Review of Systems  Constitutional: Positive for fatigue. Negative for fever.  Respiratory: Negative for cough and shortness of breath.   Gastrointestinal: Positive for nausea, vomiting, abdominal pain and diarrhea.  All other systems reviewed and are negative.     Allergies  Morphine and related  Home Medications   Prior to Admission medications   Medication Sig Start Date  End Date Taking? Authorizing Provider  ALPRAZolam Duanne Moron) 1 MG tablet Take 0.5 mg by mouth 3 (three) times daily as needed for anxiety.  10/29/11   Historical Provider, MD  aspirin EC 81 MG tablet Take 81 mg by mouth every morning.    Historical Provider, MD  atenolol (TENORMIN) 50 MG tablet Take 1 tablet (50 mg total) by mouth daily. 04/30/13   Phill Myron, MD  cyclobenzaprine (FLEXERIL) 5 MG tablet Take 1 tablet (5 mg total) by mouth 3 (three) times daily as needed for muscle spasms. 02/16/13   Waldemar Dickens, MD  Melatonin 10 MG CAPS Take 1 capsule by mouth at bedtime as needed.    Historical Provider, MD  methylcellulose (FIBER THERAPY) oral powder Take 1 packet by mouth daily.    Historical Provider, MD  mirtazapine (REMERON) 45 MG tablet Take 45 mg by mouth at bedtime.    Historical Provider, MD  Multiple Vitamin (MULTIVITAMIN WITH MINERALS) TABS Take 1 tablet by mouth every morning.    Historical Provider, MD  omeprazole (PRILOSEC) 20 MG capsule Take 1 capsule (20 mg total) by mouth daily. 06/01/13   Phill Myron, MD  oxyCODONE (OXYCONTIN) 10 MG 12 hr tablet Take 10 mg by mouth every 6 (six) hours as needed for pain.    Historical Provider, MD  oxyCODONE-acetaminophen (PERCOCET) 10-325 MG per tablet Take 1 tablet by mouth every 6 (six)  hours as needed for pain.    Historical Provider, MD  promethazine (PHENERGAN) 25 MG tablet Take 1 tablet (25 mg total) by mouth every 8 (eight) hours as needed for nausea or vomiting. 06/22/13   Josalyn C Funches, MD  simvastatin (ZOCOR) 20 MG tablet TAKE 1 TABLET (20 MG TOTAL) BY MOUTH DAILY AT 6 PM. 04/19/13   Phill Myron, MD  tiotropium (SPIRIVA) 18 MCG inhalation capsule Place 18 mcg into inhaler and inhale daily.    Historical Provider, MD   BP 155/99  Pulse 59  Temp(Src) 97.8 F (36.6 C) (Oral)  Resp 13  Ht 5\' 7"  (1.702 m)  Wt 168 lb 1.6 oz (76.25 kg)  BMI 26.32 kg/m2  SpO2 95% Physical Exam  Constitutional: He is oriented to person, place, and  time. He appears well-developed and well-nourished. No distress.  HENT:  Head: Normocephalic and atraumatic.  Eyes: Conjunctivae are normal.  Neck: Neck supple. No tracheal deviation present.  Cardiovascular: Normal rate, regular rhythm and normal heart sounds.   Pulmonary/Chest: Effort normal and breath sounds normal. No respiratory distress.  Abdominal: Soft. He exhibits distension. There is tenderness. There is guarding.  Neurological: He is alert and oriented to person, place, and time.  Skin: Skin is warm and dry.  Psychiatric: He has a normal mood and affect.    ED Course  Procedures (including critical care time) Labs Review Labs Reviewed  CBC WITH DIFFERENTIAL - Abnormal; Notable for the following:    WBC 22.8 (*)    Neutro Abs 17.8 (*)    Monocytes Absolute 1.5 (*)    All other components within normal limits  COMPREHENSIVE METABOLIC PANEL - Abnormal; Notable for the following:    Glucose, Bld 129 (*)    All other components within normal limits  LIPASE, BLOOD - Abnormal; Notable for the following:    Lipase 1317 (*)    All other components within normal limits  Randolm Idol, ED    Imaging Review Dg Chest 2 View  08/27/2013   CLINICAL DATA:  Hypertension.  Abdominal pain.  EXAM: CHEST  2 VIEW  COMPARISON:  08/27/2013 at 1305 hr  FINDINGS: Heart, mediastinum and hila are normal. Lungs are clear. No pleural effusion. No pneumothorax. Bony thorax is intact.  IMPRESSION: No active cardiopulmonary disease.   Electronically Signed   By: Lajean Manes M.D.   On: 08/27/2013 20:27   Ct Abdomen Pelvis W Contrast  08/27/2013   CLINICAL DATA:  Abdominal pain, nausea  EXAM: CT ABDOMEN AND PELVIS WITH CONTRAST  TECHNIQUE: Multidetector CT imaging of the abdomen and pelvis was performed using the standard protocol following bolus administration of intravenous contrast.  CONTRAST:  26mL OMNIPAQUE IOHEXOL 300 MG/ML  SOLN  COMPARISON:  Prior MRI from 07/09/2013.  FINDINGS: The  visualized lung bases are clear.  Diffuse hypoattenuation within the liver is compatible with steatosis. No focal intrahepatic lesions. Gallbladder is within normal limits. No biliary ductal dilatation. The spleen and adrenal glands are unremarkable.  There is inflammatory soft tissue stranding about the pancreas, suggestive of acute pancreatitis. No focal pancreatic masses. Normal contrast enhancement seen within the pancreatic parenchyma itself without evidence of necrosis. No loculated fluid collection. Wall thickening with inflammatory fat stranding along the lesser curvature of the stomach and first/ second portion of the duodenum is likely reactive in nature. Small amount of fluid/ stranding extends inferiorly along the left pericolic gutter.  Kidneys are within normal limits without evidence of nephrolithiasis, hydronephrosis, or focal enhancing renal  mass. Tiny hypodensity within the right kidney is too small the characterize by CT, but statistically likely represents a tiny cyst.  No evidence of bowel obstruction. The appendix is well visualized in the right lower quadrant and is of normal caliber and appearance without associated inflammatory changes to suggest acute appendicitis. Again, mild inflammatory stranding about the descending colon is likely secondary to inflammatory changes centered about the pancreas. No other inflammatory changes seen about the bowels.  Bladder and prostate are unremarkable.  No free intraperitoneal air. Small volume free fluid present within the pelvis.  No pathologically enlarged intra-abdominal or pelvic lymph nodes identified. Normal intravascular enhancement seen throughout the abdomen and pelvis. No evidence of splenic vein thrombosis  Degenerative Schmorl's node at the superior endplate of L1 with associated 20% height loss seen. Chronic bilateral pars defects seen at L3-4 without associated listhesis. No acute osseous abnormality. No worrisome lytic or blastic osseous  lesions.  IMPRESSION: 1. Findings suggestive of acute pancreatitis. Correlation with serum lipase recommended. No loculated fluid collection or evidence of pancreatic necrosis. 2. No other acute intra-abdominal or pelvic abnormality. 3. Hepatic steatosis. 4. 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   Electronically Signed   By: Jeannine Boga M.D.   On: 08/27/2013 21:40   Dg Chest Port 1 View  08/27/2013   CLINICAL DATA:  Mid sternal chest pain, history of hypertension  EXAM: PORTABLE CHEST - 1 VIEW  COMPARISON:  None.  FINDINGS: The heart size and mediastinal contours are within normal limits. Both lungs are clear. The visualized skeletal structures are unremarkable. Patient is rotated to the right.  IMPRESSION: No active disease.   Electronically Signed   By: Conchita Paris M.D.   On: 08/27/2013 13:22     EKG Interpretation None      MDM   Final diagnoses:  Acute pancreatitis   58 y.o. male presents with acute pancreatitis. He states that he has not had any alcohol consumption in many years and has never had a prior episode. Due to the first time onset of symptoms and high lipase we'll pursue CT scan of the abdomen to rule out emergent intra-abdominal complication. No phlegmon, or necrotizing pancreatitis, or evidence of biliary obstruction on the CT scan. Patient will be admitted to family medicine for further workup and management while remaining n.p.o. and received IV fluids and pain medicine.   Leo Grosser, MD 08/28/13 [phone]

## 2013-08-28 DIAGNOSIS — K859 Acute pancreatitis without necrosis or infection, unspecified: Principal | ICD-10-CM

## 2013-08-28 LAB — CBC WITH DIFFERENTIAL/PLATELET
Basophils Absolute: 0 10*3/uL (ref 0.0–0.1)
Basophils Relative: 0 % (ref 0–1)
Eosinophils Absolute: 0 10*3/uL (ref 0.0–0.7)
Eosinophils Relative: 0 % (ref 0–5)
HCT: 41.7 % (ref 39.0–52.0)
Hemoglobin: 13.9 g/dL (ref 13.0–17.0)
Lymphocytes Relative: 9 % — ABNORMAL LOW (ref 12–46)
Lymphs Abs: 2.3 10*3/uL (ref 0.7–4.0)
MCH: 26.4 pg (ref 26.0–34.0)
MCHC: 33.3 g/dL (ref 30.0–36.0)
MCV: 79.3 fL (ref 78.0–100.0)
Monocytes Absolute: 1.9 10*3/uL — ABNORMAL HIGH (ref 0.1–1.0)
Monocytes Relative: 8 % (ref 3–12)
Neutro Abs: 19.9 10*3/uL — ABNORMAL HIGH (ref 1.7–7.7)
Neutrophils Relative %: 83 % — ABNORMAL HIGH (ref 43–77)
Platelets: 245 10*3/uL (ref 150–400)
RBC: 5.26 MIL/uL (ref 4.22–5.81)
RDW: 14.3 % (ref 11.5–15.5)
WBC: 24.1 10*3/uL — ABNORMAL HIGH (ref 4.0–10.5)

## 2013-08-28 LAB — RAPID URINE DRUG SCREEN, HOSP PERFORMED
Amphetamines: NOT DETECTED
Barbiturates: NOT DETECTED
Benzodiazepines: POSITIVE — AB
Cocaine: NOT DETECTED
Opiates: NOT DETECTED
Tetrahydrocannabinol: NOT DETECTED

## 2013-08-28 LAB — CBC
HCT: 39.8 % (ref 39.0–52.0)
Hemoglobin: 13 g/dL (ref 13.0–17.0)
MCH: 25.8 pg — ABNORMAL LOW (ref 26.0–34.0)
MCHC: 32.7 g/dL (ref 30.0–36.0)
MCV: 79 fL (ref 78.0–100.0)
Platelets: 226 10*3/uL (ref 150–400)
RBC: 5.04 MIL/uL (ref 4.22–5.81)
RDW: 14.3 % (ref 11.5–15.5)
WBC: 20.6 10*3/uL — ABNORMAL HIGH (ref 4.0–10.5)

## 2013-08-28 LAB — COMPREHENSIVE METABOLIC PANEL
ALT: 29 U/L (ref 0–53)
AST: 28 U/L (ref 0–37)
Albumin: 3.7 g/dL (ref 3.5–5.2)
Alkaline Phosphatase: 58 U/L (ref 39–117)
BUN: 13 mg/dL (ref 6–23)
CO2: 25 mEq/L (ref 19–32)
Calcium: 8.5 mg/dL (ref 8.4–10.5)
Chloride: 104 mEq/L (ref 96–112)
Creatinine, Ser: 0.72 mg/dL (ref 0.50–1.35)
GFR calc Af Amer: >90 mL/min (ref >90)
GFR calc non Af Amer: >90 mL/min (ref >90)
Glucose, Bld: 113 mg/dL — ABNORMAL HIGH (ref 70–99)
Potassium: 4 mEq/L (ref 3.7–5.3)
Sodium: 142 mEq/L (ref 137–147)
Total Bilirubin: 0.7 mg/dL (ref 0.3–1.2)
Total Protein: 6.6 g/dL (ref 6.0–8.3)

## 2013-08-28 LAB — URINALYSIS, ROUTINE W REFLEX MICROSCOPIC
Bilirubin Urine: NEGATIVE
Glucose, UA: NEGATIVE mg/dL
Hgb urine dipstick: NEGATIVE
Ketones, ur: 15 mg/dL — AB
Leukocytes, UA: NEGATIVE
Nitrite: NEGATIVE
Protein, ur: NEGATIVE mg/dL
Specific Gravity, Urine: 1.02 (ref 1.005–1.030)
Urobilinogen, UA: 0.2 mg/dL (ref 0.0–1.0)
pH: 5.5 (ref 5.0–8.0)

## 2013-08-28 LAB — CREATININE, SERUM
Creatinine, Ser: 0.72 mg/dL (ref 0.50–1.35)
GFR calc Af Amer: >90 mL/min (ref >90)
GFR calc non Af Amer: >90 mL/min (ref >90)

## 2013-08-28 LAB — GLUCOSE, CAPILLARY: Glucose-Capillary: 118 mg/dL — ABNORMAL HIGH (ref 70–99)

## 2013-08-28 MED ORDER — FOLIC ACID 1 MG PO TABS
1.0000 mg | ORAL_TABLET | Freq: Every day | ORAL | Status: DC
  Administered 2013-08-28 – 2013-08-31 (×4): 1 mg via ORAL
  Filled 2013-08-28 (×4): qty 1

## 2013-08-28 MED ORDER — FLUTICASONE PROPIONATE 50 MCG/ACT NA SUSP
2.0000 | Freq: Three times a day (TID) | NASAL | Status: DC | PRN
  Filled 2013-08-28: qty 16

## 2013-08-28 MED ORDER — ADULT MULTIVITAMIN W/MINERALS CH
1.0000 | ORAL_TABLET | Freq: Every day | ORAL | Status: DC
  Administered 2013-08-28 – 2013-08-31 (×4): 1 via ORAL
  Filled 2013-08-28 (×4): qty 1

## 2013-08-28 MED ORDER — SODIUM CHLORIDE 0.9 % IV SOLN
INTRAVENOUS | Status: DC
  Administered 2013-08-28 – 2013-08-30 (×10): via INTRAVENOUS
  Administered 2013-08-31 – 2013-09-01 (×4): 1000 mL via INTRAVENOUS
  Administered 2013-09-02 (×4): via INTRAVENOUS

## 2013-08-28 MED ORDER — LAMOTRIGINE 200 MG PO TABS
200.0000 mg | ORAL_TABLET | Freq: Every day | ORAL | Status: DC
  Administered 2013-08-28 – 2013-09-02 (×5): 200 mg via ORAL
  Filled 2013-08-28 (×7): qty 1

## 2013-08-28 MED ORDER — ONDANSETRON HCL 4 MG PO TABS
4.0000 mg | ORAL_TABLET | Freq: Four times a day (QID) | ORAL | Status: DC | PRN
  Administered 2013-08-29 (×2): 4 mg via ORAL
  Filled 2013-08-28 (×2): qty 1

## 2013-08-28 MED ORDER — HYDROMORPHONE HCL PF 1 MG/ML IJ SOLN
2.0000 mg | INTRAMUSCULAR | Status: DC | PRN
  Administered 2013-08-28 – 2013-08-30 (×15): 2 mg via INTRAVENOUS
  Filled 2013-08-28 (×15): qty 2

## 2013-08-28 MED ORDER — LORAZEPAM 1 MG PO TABS
1.0000 mg | ORAL_TABLET | Freq: Four times a day (QID) | ORAL | Status: AC | PRN
  Administered 2013-08-30: 1 mg via ORAL
  Filled 2013-08-28: qty 1

## 2013-08-28 MED ORDER — BIOTENE DRY MOUTH MT LIQD
15.0000 mL | Freq: Two times a day (BID) | OROMUCOSAL | Status: DC
  Administered 2013-08-28 – 2013-09-01 (×9): 15 mL via OROMUCOSAL

## 2013-08-28 MED ORDER — VITAMIN B-12 100 MCG PO TABS
100.0000 ug | ORAL_TABLET | Freq: Every day | ORAL | Status: DC
  Administered 2013-08-28 – 2013-09-03 (×7): 100 ug via ORAL
  Filled 2013-08-28 (×7): qty 1

## 2013-08-28 MED ORDER — LORAZEPAM 2 MG/ML IJ SOLN
1.0000 mg | Freq: Four times a day (QID) | INTRAMUSCULAR | Status: DC | PRN
  Administered 2013-08-31: 1 mg via INTRAVENOUS
  Filled 2013-08-28: qty 1

## 2013-08-28 MED ORDER — HYDROMORPHONE HCL PF 1 MG/ML IJ SOLN
1.0000 mg | INTRAMUSCULAR | Status: DC | PRN
  Administered 2013-08-28 (×2): 1 mg via INTRAVENOUS
  Filled 2013-08-28 (×3): qty 1

## 2013-08-28 MED ORDER — HEPARIN SODIUM (PORCINE) 5000 UNIT/ML IJ SOLN
5000.0000 [IU] | Freq: Three times a day (TID) | INTRAMUSCULAR | Status: DC
  Administered 2013-08-28 – 2013-09-03 (×19): 5000 [IU] via SUBCUTANEOUS
  Filled 2013-08-28 (×23): qty 1

## 2013-08-28 MED ORDER — ONDANSETRON HCL 4 MG/2ML IJ SOLN
4.0000 mg | Freq: Four times a day (QID) | INTRAMUSCULAR | Status: DC | PRN
  Administered 2013-08-28: 4 mg via INTRAVENOUS
  Filled 2013-08-28: qty 2

## 2013-08-28 MED ORDER — LORAZEPAM 2 MG/ML IJ SOLN
1.0000 mg | Freq: Four times a day (QID) | INTRAMUSCULAR | Status: AC | PRN
  Administered 2013-08-28 (×2): 1 mg via INTRAVENOUS
  Filled 2013-08-28 (×2): qty 1

## 2013-08-28 MED ORDER — GABAPENTIN 300 MG PO CAPS
300.0000 mg | ORAL_CAPSULE | Freq: Three times a day (TID) | ORAL | Status: DC
  Administered 2013-08-28 (×3): 600 mg via ORAL
  Administered 2013-08-29 – 2013-08-30 (×6): 300 mg via ORAL
  Administered 2013-08-31 – 2013-09-01 (×4): 600 mg via ORAL
  Administered 2013-09-02 – 2013-09-03 (×3): 300 mg via ORAL
  Filled 2013-08-28 (×21): qty 2

## 2013-08-28 NOTE — Significant Event (Signed)
Rapid Response Event Note  Overview: Time Called: 0830 Arrival Time: 0835 Event Type: Other (Comment)  Initial Focused Assessment: Pt alert and oriented. Described pain as sharp and mimicking same pain upon presentation to hospital-stomach tender to touch with some radiation into sternal area. Described intent to wean himself off long term pain meds and having a rough time, esp overnight, with back pain, positioning. State he has had episodes at home of shortness of breath with his narcotic pain dosages. VSS. Oxygen 2 LPM applied by nurses-oxygen saturation levels good. Patient is pale, with restless movements, appears anxious about the situation.  Interventions: Reviewed chart and history, vital signs. Discussed symptoms with patient. MD at bedside performing focussed exam.  Plan is to reassure patient about our measured pain management program for him, while assuring him that we are monitoring his function to avoid previous shortness of breath episodes.  Communicated  elements of care plan with RN.  Will keep patient on Rapid Response 'watch list' and be available for further nursing consultation.      Event Summary:   Patient remained in room.   at          Baron Hamper

## 2013-08-28 NOTE — H&P (Signed)
FMTS Attending Admit Note Patient seen and examined by me, discussed with resident team and I agree with Dr Awanda Mink' admission plan for this patient.  Patient reports that his abdominal and back pain is indeed improved somewhat with the pain medication.  Would like to try taking clear liquids when it is thought to be advisable.  He denies alcohol use in the past several years; has never had an episode of pancreatitis, even when he used to drink. Of note, has never had elevated TGs and are not elevated on this admission.  He is currently on NS at 150cc/hr.  He is alert and in moderate distress. Tender abdomen to palpation. Audible bowel sounds.  A/P: Idiopathic pancreatitis, without evidence of pseudocyst or abscess on CT. Marked leukocytosis is likely secondary to pancreatitis. To follow clinically; blood cx and empiric abx if spikes fever.  Pain control, appears to be improving. Would be reasonable to try clear liquids by mouth.  Dalbert Mayotte, MD

## 2013-08-28 NOTE — Progress Notes (Signed)
Patient seen and examined by me today, discussed with resident team and I agree with Dr Parks Ranger' note for today.  Dalbert Mayotte, MD

## 2013-08-28 NOTE — Progress Notes (Signed)
Pt arrived on unit, alert and oriented x4. Able to make needs known. Appears to be in no distress. LFA IV site clean, dry and intact, with ongoing IV fluids, infusing well. Pt care guide provided. We will placed on continuous pulse oximeter as ordered. Bed at its lowest position. Call light within reached. We will continue to monitor.

## 2013-08-28 NOTE — Progress Notes (Signed)
Family Medicine Teaching Service Daily Progress Note Intern Pager: [phone]  Patient name: [name] Medical record number: [id number] Date of birth: Jan 30, 1956 Age: 58 y.o. Gender: male  Primary Care Provider: Phill Myron, MD Consultants: None Code Status: Full  Pt Overview and Major Events to Date:    Assessment and Plan: [name] is a 58 y.o. male presenting with acute pancreatitis . PMH is significant for Hepatitis C, alcohol abuse, Bipolar Disorder, HTN.  #Acute Pancreatitis - Unclear exact etiology, suspicion of EtOH, however pt denies any recent alcohol use (remote history several yrs ago) and no evidence of CBD dilation or gallbladder inflammation on CT scan. - Currently afebrile, vitals stable, persistent pain with some migratory c/o lower central chest / back, generalized abdominal pain, significant complexity due to chronic narcotic abuse hx - Advance diet to Clear Liquid - Continue NS @ 125 cc/hr - Increase Dilaudid to 2 mg IV q 2 hrs PRN - received total 6mg  Dilaudid IV (12 hours), without adequate pain control, note - (documented allergy to morphine and tolerates dilaudid) - CMET stable, nml LFTs, T.Bili, electrolytes - WBC remains elevated 22-->24-->20.6, with left shift - Repeat Lipase in AM - Zofran PRN for nausea/vomiting  # Acute Leukocytosis - Most likely 2/2 his pancreatitis. CXR no infiltrate, UA (negative) - WBC remains elevated 22-->24-->20.6, with left shift  #Hx of Polysubstance Abuse/ Chronic Opioid Dependence, complicating management of pancreatitis  - Pt with significant substance abuse history  - As well, has been on narcotics for several yrs 2/2 low back pain and has tried weaning himself off this in the past several months, which has complicated his presentation as it appears he has symptoms of withdrawal and worsening pain on lower doses of narcotics, recently taking Oxycodone >60mg  daily - UDS (positive, BDZ only)  #Hx of Hepatitis C  -  LFT's stable, repeat in AM  #Hx of Bipolar Disorder/anxiety disorder  - Resume home Lamictal 200mg  PO daily - Ativan IV PRN for anxiety  #Hx of Alcohol Abuse  - Pt states last drink several years ago (1987 with 3 days of heavy alcohol use around 14 yrs ago)  - on CIWA, no ativan due to scores  FEN/GI: Clear liquid, NS @ 125 cc/hr  Prophylaxis: SQ heparin  Disposition: Admitted for acute pancreatitis, continue GI rest with gradual advance to clear liquids, IVF rehydration, pain control (complex given narcotic abuse hx), continue to monitor for improvement.  Subjective:  Patient complains of persistent pain, now with sharp central lower chest pain and associated generalized abdominal pain, similar to previous pain during current hospitalization, tender to the touch. Admits that he has not been vocalizing that the pain medicine overnight didn't improve his pain significantly, "tried to tough it out", and he is concerned about taking high doses of narcotics, ultimately trying to wean self off.  UPDATE: Improved with Ativan and increased Dilaudid dose.  Objective: Temp:  [97.7 F (36.5 C)-99.4 F (37.4 C)] 98 F (36.7 C) (05/23 1247) Pulse Rate:  [59-90] 90 (05/23 1247) Resp:  [9-18] 14 (05/23 1247) BP: (114-155)/(77-104) 120/79 mmHg (05/23 1247) SpO2:  [93 %-97 %] 93 % (05/23 1247) Weight:  [168 lb 1.6 oz (76.25 kg)-171 lb 8.3 oz (77.8 kg)] 171 lb 8.3 oz (77.8 kg) (05/23 0415) Physical Exam: General: laying in bed, mild acute distress due to pain  HEENT: EOMI, Miotic reactive pupils B/L, MMM Cardiovascular: RRR, no murmurs appreciated  Respiratory: CTAB, decreased insp effort due to pain Abdomen: Soft, no rigidity, mild  guarding diffuse, no rebound. + TTP epigastric, generalized abd pain, no focal RLQ. No palpable HSM  Extremities: No edema B/L + 2 pulses B/L LE  Skin: No rashes  Neuro: No focal deficits, CN 2-12 intact, AAO x 3  Laboratory:  Recent Labs Lab 08/27/13 0909  08/27/13 1900 08/28/13 0435  WBC 16.7* 22.8* 20.6*  24.1*  HGB 13.9 14.7 13.0  13.9  HCT 41.1 42.5 39.8  41.7  PLT 247 265 226  245    Recent Labs Lab 08/27/13 0909 08/27/13 1904 08/28/13 0435  NA 142 138 142  K 4.2 4.3 4.0  CL 102 101 104  CO2 25 23 25   BUN 13 17 13   CREATININE 0.80 0.80 0.72  0.72  CALCIUM 10.1 9.5 8.5  PROT  --  7.5 6.6  BILITOT  --  0.5 0.7  ALKPHOS  --  70 58  ALT  --  28 29  AST  --  27 28  GLUCOSE 130* 129* 113*   Lipase - 1317 (5/22) -->  5/22 UA - Unremarkable 5/22 UDS - only BDZ (positive)  Troponin-I - neg Trop-POCT - neg TG - 96  Imaging/Diagnostic Tests:  5/22 CT Abd / Pelvis IMPRESSION:  1. Findings suggestive of acute pancreatitis. Correlation with serum  lipase recommended. No loculated fluid collection or evidence of  pancreatic necrosis.  2. No other acute intra-abdominal or pelvic abnormality.  3. Hepatic steatosis.  4. 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  5/22 CXR 2v IMPRESSION:  No active cardiopulmonary disease.  5/22 EKG NSR, HR 63, no acute ST-T wave changes.   Nobie Putnam, DO 08/28/2013, 3:32 PM PGY-1, [place] Intern pager: [phone], text pages welcome

## 2013-08-28 NOTE — Progress Notes (Signed)
Patient complains of pain to mid sternum. Staff RN and Camera operator at bedside. Rapid response RN called for eval. MD at bedside and orders received.

## 2013-08-28 NOTE — Progress Notes (Signed)
Received pt report from [facility] - ED.

## 2013-08-29 DIAGNOSIS — M545 Low back pain, unspecified: Secondary | ICD-10-CM

## 2013-08-29 LAB — CBC WITH DIFFERENTIAL/PLATELET
Basophils Absolute: 0 10*3/uL (ref 0.0–0.1)
Basophils Relative: 0 % (ref 0–1)
Eosinophils Absolute: 0 10*3/uL (ref 0.0–0.7)
Eosinophils Relative: 0 % (ref 0–5)
HCT: 35.2 % — ABNORMAL LOW (ref 39.0–52.0)
Hemoglobin: 11.3 g/dL — ABNORMAL LOW (ref 13.0–17.0)
Lymphocytes Relative: 10 % — ABNORMAL LOW (ref 12–46)
Lymphs Abs: 2.2 10*3/uL (ref 0.7–4.0)
MCH: 25.9 pg — ABNORMAL LOW (ref 26.0–34.0)
MCHC: 32.1 g/dL (ref 30.0–36.0)
MCV: 80.5 fL (ref 78.0–100.0)
Monocytes Absolute: 1.6 10*3/uL — ABNORMAL HIGH (ref 0.1–1.0)
Monocytes Relative: 8 % (ref 3–12)
Neutro Abs: 17 10*3/uL — ABNORMAL HIGH (ref 1.7–7.7)
Neutrophils Relative %: 82 % — ABNORMAL HIGH (ref 43–77)
Platelets: 171 10*3/uL (ref 150–400)
RBC: 4.37 MIL/uL (ref 4.22–5.81)
RDW: 14.1 % (ref 11.5–15.5)
WBC: 20.8 10*3/uL — ABNORMAL HIGH (ref 4.0–10.5)

## 2013-08-29 LAB — COMPREHENSIVE METABOLIC PANEL
ALT: 24 U/L (ref 0–53)
AST: 23 U/L (ref 0–37)
Albumin: 3 g/dL — ABNORMAL LOW (ref 3.5–5.2)
Alkaline Phosphatase: 48 U/L (ref 39–117)
BUN: 11 mg/dL (ref 6–23)
CO2: 25 mEq/L (ref 19–32)
Calcium: 7.9 mg/dL — ABNORMAL LOW (ref 8.4–10.5)
Chloride: 100 mEq/L (ref 96–112)
Creatinine, Ser: 0.67 mg/dL (ref 0.50–1.35)
GFR calc Af Amer: >90 mL/min (ref >90)
GFR calc non Af Amer: >90 mL/min (ref >90)
Glucose, Bld: 107 mg/dL — ABNORMAL HIGH (ref 70–99)
Potassium: 3.5 mEq/L — ABNORMAL LOW (ref 3.7–5.3)
Sodium: 137 mEq/L (ref 137–147)
Total Bilirubin: 0.5 mg/dL (ref 0.3–1.2)
Total Protein: 6.1 g/dL (ref 6.0–8.3)

## 2013-08-29 LAB — LIPASE, BLOOD: Lipase: 140 U/L — ABNORMAL HIGH (ref 11–59)

## 2013-08-29 LAB — GLUCOSE, CAPILLARY: Glucose-Capillary: 110 mg/dL — ABNORMAL HIGH (ref 70–99)

## 2013-08-29 MED ORDER — OXYCODONE-ACETAMINOPHEN 5-325 MG PO TABS
2.0000 | ORAL_TABLET | Freq: Four times a day (QID) | ORAL | Status: DC
  Administered 2013-08-29 – 2013-08-31 (×5): 2 via ORAL
  Filled 2013-08-29 (×5): qty 2

## 2013-08-29 MED ORDER — MIRTAZAPINE 45 MG PO TABS
45.0000 mg | ORAL_TABLET | Freq: Every day | ORAL | Status: DC
  Administered 2013-08-29 – 2013-09-02 (×4): 45 mg via ORAL
  Filled 2013-08-29 (×6): qty 1

## 2013-08-29 MED ORDER — POTASSIUM CHLORIDE CRYS ER 20 MEQ PO TBCR
40.0000 meq | EXTENDED_RELEASE_TABLET | Freq: Once | ORAL | Status: AC
  Administered 2013-08-29: 40 meq via ORAL
  Filled 2013-08-29: qty 2

## 2013-08-29 NOTE — Progress Notes (Signed)
FMTS Attending Note Patient seen and examined by me, discussed with resident team and I agree with Dr Jonathon Jordan note.  Patient still with diffuse tenderness and WBC count that remains at 20.  Afebrile, marked decrease in lipase. Reports passing flatus, has clear liquids at bedside.  Pain control, monitoring and following WBC count.  To get OOB as pain allows.  He has chronic back pain as well, which complicates his complaint of back pain with pancreatitis.  Dalbert Mayotte, MD

## 2013-08-29 NOTE — Progress Notes (Signed)
Family Medicine Teaching Service Daily Progress Note Intern Pager: [phone]  Patient name: [name] Medical record number: [id number] Date of birth: [birth year] Age: 58 y.o. Gender: male  Primary Care Provider: Phill Myron, MD Consultants: None Code Status: Full  Pt Overview and Major Events to Date:  5/22 - Admit 5/23 - Diet - Clear liquid  Assessment and Plan: [name] is a 58 y.o. male presenting with acute pancreatitis . PMH is significant for Hepatitis C, alcohol abuse, Bipolar Disorder, HTN.  #Acute Pancreatitis -  Unclear exact etiology, suspicion of EtOH, however pt denies any recent alcohol use (remote history several yrs ago) and no evidence of CBD dilation or gallbladder inflammation on CT scan. - Lipase trending down - 1317 >>> 140 - Patient continues to have significant pain.  Adding scheduled Percocet 2 tablets Q6 hour with Dilaudid 2 mg IV Q2 PRN for breakthrough - Continue NS @ 125 cc/hr; Continuing with Clear Liquid diet. - Daily CBC and CMP - Zofran PRN for nausea/vomiting  # Acute Leukocytosis - Most likely 2/2 his pancreatitis; No other infectious source at this time. - Will continue to trend daily.   #Hx of Polysubstance Abuse/ Chronic Opioid Dependence, complicating management of pancreatitis  - Pt with significant substance abuse history  - UDS (positive, BDZ only) - Pain medication as above  #Hx of Hepatitis C  - LFT's WNL - Will continue to monitor; will need outpatient followup regarding this.  #Hx of Bipolar Disorder/anxiety disorder  - Continue home Lamictal 200mg  PO daily - Ativan IV PRN for anxiety  #Hx of Alcohol Abuse  - CIWA  FEN/GI: Clear liquid, NS @ 125 cc/hr  Prophylaxis: SQ heparin  Disposition: Admitted for acute pancreatitis; Home pending clinical improvement.   Subjective:  Reports severe abdominal pain and back pain this am. Poor PO intake.   Objective: Temp:  [98 F (36.7 C)-99.6 F (37.6 C)] 98.2 F (36.8 C)  (05/24 0606) Pulse Rate:  [82-101] 101 (05/24 0606) Resp:  [14-18] 18 (05/24 0606) BP: (120-162)/(79-95) 122/79 mmHg (05/24 0606) SpO2:  [93 %-96 %] 96 % (05/24 0606)  Physical Exam: General: lying in bed, appears in distress secondary to pain Cardiovascular: RRR, no murmurs appreciated  Respiratory: CTAB Abdomen: Soft, distended, diffusely tender to palpation.  +BS x 4.  Extremities: No edema.  Laboratory:  Recent Labs Lab 08/27/13 0909 08/27/13 1900 08/28/13 0435  WBC 16.7* 22.8* 20.6*  24.1*  HGB 13.9 14.7 13.0  13.9  HCT 41.1 42.5 39.8  41.7  PLT 247 265 226  245    Recent Labs Lab 08/27/13 0909 08/27/13 1904 08/28/13 0435  NA 142 138 142  K 4.2 4.3 4.0  CL 102 101 104  CO2 25 23 25   BUN 13 17 13   CREATININE 0.80 0.80 0.72  0.72  CALCIUM 10.1 9.5 8.5  PROT  --  7.5 6.6  BILITOT  --  0.5 0.7  ALKPHOS  --  70 58  ALT  --  28 29  AST  --  27 28  GLUCOSE 130* 129* 113*   Lipase - 1317 (5/22) -->140  5/22 UA - Unremarkable 5/22 UDS - only BDZ (positive)  Troponin-I - neg Trop-POCT - neg TG - 96  Imaging/Diagnostic Tests:  5/22 CT Abd / Pelvis IMPRESSION:  1. Findings suggestive of acute pancreatitis. Correlation with serum  lipase recommended. No loculated fluid collection or evidence of  pancreatic necrosis.  2. No other acute intra-abdominal or pelvic abnormality.  3. Hepatic steatosis.  4. 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  5/22 CXR 2v IMPRESSION:  No active cardiopulmonary disease.  5/22 EKG NSR, HR 63, no acute ST-T wave changes.   Coral Spikes, DO 08/29/2013, 7:28 AM PGY-2, [place] Intern pager: [phone], text pages welcome

## 2013-08-29 NOTE — ED Provider Notes (Signed)
I saw and evaluated the patient, reviewed the resident's note and I agree with the findings and plan. Patient is a 58 year old male who presents with complaints of epigastric abdominal pain radiating to the back which has been present for the past 24 hours. His discomfort is been constant and is worsening. He denies any ill contacts. He denies any alcohol use. He feels nauseated and has vomited, but denies any diarrhea.  On exam, vitals are stable and the patient is afebrile. Head is atraumatic, normocephalic. Neck is supple. Heart regular rate and rhythm. Lungs are clear. Abdomen is noted to have tenderness to palpation in the epigastric region. There is no rebound and no guarding.  Workup reveals an elevated lipase and elevated white count. CT scan of the abdomen and pelvis reveals acute pancreatitis with no obvious cause. He denies alcohol use and it does not appear that  gallstones are the cause. This may be idiopathic or related to medications. Either way the patient will be admitted to the hospitalist service for pain management and IV fluids.   EKG Interpretation None        Veryl Speak, MD 08/29/13 [phone]

## 2013-08-30 LAB — CBC WITH DIFFERENTIAL/PLATELET
Basophils Absolute: 0 10*3/uL (ref 0.0–0.1)
Basophils Relative: 0 % (ref 0–1)
Eosinophils Absolute: 0.1 10*3/uL (ref 0.0–0.7)
Eosinophils Relative: 0 % (ref 0–5)
HCT: 35.4 % — ABNORMAL LOW (ref 39.0–52.0)
Hemoglobin: 11.8 g/dL — ABNORMAL LOW (ref 13.0–17.0)
Lymphocytes Relative: 9 % — ABNORMAL LOW (ref 12–46)
Lymphs Abs: 1.7 10*3/uL (ref 0.7–4.0)
MCH: 26.5 pg (ref 26.0–34.0)
MCHC: 33.3 g/dL (ref 30.0–36.0)
MCV: 79.6 fL (ref 78.0–100.0)
Monocytes Absolute: 1.6 10*3/uL — ABNORMAL HIGH (ref 0.1–1.0)
Monocytes Relative: 9 % (ref 3–12)
Neutro Abs: 15.2 10*3/uL — ABNORMAL HIGH (ref 1.7–7.7)
Neutrophils Relative %: 82 % — ABNORMAL HIGH (ref 43–77)
Platelets: 186 10*3/uL (ref 150–400)
RBC: 4.45 MIL/uL (ref 4.22–5.81)
RDW: 13.9 % (ref 11.5–15.5)
WBC: 18.5 10*3/uL — ABNORMAL HIGH (ref 4.0–10.5)

## 2013-08-30 LAB — COMPREHENSIVE METABOLIC PANEL
ALT: 25 U/L (ref 0–53)
AST: 22 U/L (ref 0–37)
Albumin: 3.1 g/dL — ABNORMAL LOW (ref 3.5–5.2)
Alkaline Phosphatase: 55 U/L (ref 39–117)
BUN: 7 mg/dL (ref 6–23)
CO2: 28 mEq/L (ref 19–32)
Calcium: 8.4 mg/dL (ref 8.4–10.5)
Chloride: 99 mEq/L (ref 96–112)
Creatinine, Ser: 0.55 mg/dL (ref 0.50–1.35)
GFR calc Af Amer: >90 mL/min (ref >90)
GFR calc non Af Amer: >90 mL/min (ref >90)
Glucose, Bld: 114 mg/dL — ABNORMAL HIGH (ref 70–99)
Potassium: 3.5 mEq/L — ABNORMAL LOW (ref 3.7–5.3)
Sodium: 136 mEq/L — ABNORMAL LOW (ref 137–147)
Total Bilirubin: 0.5 mg/dL (ref 0.3–1.2)
Total Protein: 6.7 g/dL (ref 6.0–8.3)

## 2013-08-30 LAB — GLUCOSE, CAPILLARY: Glucose-Capillary: 111 mg/dL — ABNORMAL HIGH (ref 70–99)

## 2013-08-30 MED ORDER — HYDROMORPHONE HCL PF 1 MG/ML IJ SOLN
3.0000 mg | INTRAMUSCULAR | Status: DC | PRN
  Administered 2013-08-30 – 2013-08-31 (×6): 3 mg via INTRAVENOUS
  Filled 2013-08-30 (×6): qty 3

## 2013-08-30 NOTE — Progress Notes (Signed)
FMTS Attending Note Patient seen and examined by me, discussed with resident team and I agree with Dr Parks Ranger' note for today. Patient's abdomen is mildly less distended than yesterday; is passing flatus. No elevation of transaminases; WBC count is coming down slowly.  If not showing marked improvement in either pain control or leukocytosis by tomorrow, would consider re-imaging of abdomen. His chronic low back pain (previously under the care of Pain Management) is complicating this picture. Dalbert Mayotte, MD

## 2013-08-30 NOTE — Progress Notes (Signed)
Family Medicine Teaching Service Daily Progress Note Intern Pager: [phone]  Patient name: [name] Medical record number: [id number] Date of birth: 12/14/1955 Age: 58 y.o. Gender: male  Primary Care Provider: Phill Myron, MD Consultants: None Code Status: Full  Pt Overview and Major Events to Date:  5/22 - Admit, acute pancreatitis on CT, lipase 1317, WBC 22.8 5/23 - Diet - Clear liquid, WBC 24.1 5/24 - Lipase 140, WBC 20.8 5/25 - Inc Dilaudid (previously not covered well due to narcotic use hx), WBC 18.5  Assessment and Plan: [name] is a 58 y.o. male presenting with acute pancreatitis . PMH is significant for Hepatitis C, alcohol abuse, Bipolar Disorder, HTN.  #Acute Pancreatitis -  Unclear exact etiology, suspicion of EtOH, however pt denies any recent alcohol use (remote history several yrs ago) and no evidence of CBD dilation or gallbladder inflammation on CT scan. - Clinically appears stable, slightly improved today, vitals stable and afebrile, abdominal exam mildly improved from yesterday without worsening, tolerating clear liquids and +flatus, expect gradual improvement, continue current course today, however concern if any clinical worsening, febrile, inc WBC, would have low threshold to repeat CT Abd on 5/26 - Lipase trending down - 1317 >>> 140 - Pain Control:       - Previously scheduled Percocet 10mg  without improvement       - Received Dilaudid 2mg  IV q 2 hr (total 20mg  in 24 hours), without adequate pain coverage, given hx narcotic abuse       - Increase Dilaudid to 3mg  IV q 2 hr PRN today - Continue NS @ 125 cc/hr; Continuing with Clear Liquid diet. - Improved WBC - Daily CBC and CMP - Zofran PRN for nausea/vomiting - Ordered PT eval / treat - given hx chronic LBP, expect positioning and activity playing a role in persistent pain  # Acute Leukocytosis - Improving - Most likely 2/2 his pancreatitis; No other infectious source at this time. - Improved WBC  20.8-->18.5 - Trend WBC in AM  #Hx of Polysubstance Abuse/ Chronic Opioid Dependence, complicating management of pancreatitis  - Pt with significant substance abuse history  - UDS (positive, BDZ only) - Pain medication as above  #Hx of Hepatitis C  - LFT's WNL - Will continue to monitor; will need outpatient followup regarding this.  #Hx of Bipolar Disorder/anxiety disorder  - Continue home Lamictal 200mg  PO daily - Ativan IV PRN for anxiety  #Hx of Alcohol Abuse  - CIWA  FEN/GI: Clear liquid, NS @ 125 cc/hr  Prophylaxis: SQ heparin  Disposition: Admitted for acute pancreatitis; Home pending clinical improvement. Continued difficult pain management given prior narcotic use history, requiring higher doses to achieve response. Continue to manage pain / advance diet, goal to transition to PO pain meds and tolerating PO prior to discharge.  Subjective:  Overall slight improvement today, still with persistent generalized abdominal and low back pain (hx chronic pain). Reports pain is improved with prolonged periods of rest, however if ambulate or get up to do any activity he will start to experiencing worsening pain after laying back down. Intermittently nausea, otherwise tolerating clear liquid diet. Passing +flatus, no BM.  Objective: Temp:  [98.1 F (36.7 C)-98.5 F (36.9 C)] 98.1 F (36.7 C) (05/25 0459) Pulse Rate:  [92-97] 92 (05/25 0459) Resp:  [18] 18 (05/25 0459) BP: (119-155)/(74-102) 119/74 mmHg (05/25 0459) SpO2:  [92 %-93 %] 92 % (05/25 0459)  Physical Exam: General: lying in bed, uncomfortable due to pain, but cooperative, NAD Cardiovascular: RRR,  no murmurs appreciated  Respiratory: CTAB Abdomen: Soft, mildly improved distention, generalized tenderness (epigastric region >), mild guarding but no rebound tenderness, +hypoactive BS in all quads  Extremities: No edema.  Laboratory:  Recent Labs Lab 08/28/13 0435 08/29/13 0745 08/30/13 0512  WBC 20.6*  24.1*  20.8* 18.5*  HGB 13.0  13.9 11.3* 11.8*  HCT 39.8  41.7 35.2* 35.4*  PLT 226  245 171 186    Recent Labs Lab 08/28/13 0435 08/29/13 0745 08/30/13 0512  NA 142 137 136*  K 4.0 3.5* 3.5*  CL 104 100 99  CO2 25 25 28   BUN 13 11 7   CREATININE 0.72  0.72 0.67 0.55  CALCIUM 8.5 7.9* 8.4  PROT 6.6 6.1 6.7  BILITOT 0.7 0.5 0.5  ALKPHOS 58 48 55  ALT 29 24 25   AST 28 23 22   GLUCOSE 113* 107* 114*   Lipase - 1317 (5/22) -->140  5/22 UA - Unremarkable 5/22 UDS - only BDZ (positive)  Troponin-I - neg Trop-POCT - neg TG - 96  Imaging/Diagnostic Tests:  5/22 CT Abd / Pelvis IMPRESSION:  1. Findings suggestive of acute pancreatitis. Correlation with serum  lipase recommended. No loculated fluid collection or evidence of  pancreatic necrosis.  2. No other acute intra-abdominal or pelvic abnormality.  3. Hepatic steatosis.  4. 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  5/22 CXR 2v IMPRESSION:  No active cardiopulmonary disease.  5/22 EKG NSR, HR 63, no acute ST-T wave changes.   Nobie Putnam, DO 08/30/2013, 11:05 AM PGY-1, [facility] Intern pager: [phone], text pages welcome

## 2013-08-30 NOTE — Evaluation (Signed)
Physical Therapy Evaluation Patient Details Name: [name] MRN: [id number] DOB: 12-02-1955 Today's Date: 08/30/2013   History of Present Illness  Jair Lindblad is a 58 y.o. male presenting with acute pancreatitis . PMH is significant for Hepatitis C, alcohol abuse, Bipolar Disorder, HTN  Clinical Impression  Pt with decreased activity tolerance and balance today with inability to tolerate sitting up in chair, EOB or further activity. Pt with O2 on arrival with sats 96% but he does not wear at baseline but with removal sats drop to 88%. Returned O2 for remainder of session with sats 87-99% with activity and cues for pursed lip breathing. Pt with decreased assist at home and will benefit from acute therapy to maximize mobility, function, independence and balance to decrease fall risk.     Follow Up Recommendations Home health PT;Supervision for mobility/OOB (if pt unable to arrange assist at home or unable to meet goals will need SNF)    Equipment Recommendations  Rolling walker with 5" wheels    Recommendations for Other Services       Precautions / Restrictions Precautions Precautions: Fall Precaution Comments: watch sats      Mobility  Bed Mobility Overal bed mobility: Needs Assistance Bed Mobility: Rolling;Sidelying to Sit;Sit to Sidelying Rolling: Supervision Sidelying to sit: Min assist     Sit to sidelying: Min guard General bed mobility comments: cues for sequence to decreased abdominal and back pain  Transfers Overall transfer level: Needs assistance   Transfers: Sit to/from Stand Sit to Stand: Supervision         General transfer comment: supervision for safety  Ambulation/Gait Ambulation/Gait assistance: Min assist Ambulation Distance (Feet): 150 Feet Assistive device: None Gait Pattern/deviations: Step-through pattern;Decreased stride length   Gait velocity interpretation: Below normal speed for age/gender General Gait Details: decreased speed and  stability with standing with 4 partial LOB with gait with tactile cues to correct. Pt with slightly antalgic limp to gait which pt reports as normal for initial ambulation at home from being stiff. Recommend use of RW next trial  Stairs            Wheelchair Mobility    Modified Rankin (Stroke Patients Only)       Balance Overall balance assessment: Needs assistance     Sitting balance - Comments: pt unable to tolerate sitting EOB or in chair to assess balance with report of increased abdominal pain in sitting     Standing balance-Leahy Scale: Poor                               Pertinent Vitals/Pain 8/10 abdominal pain and chronic back pain    Home Living Family/patient expects to be discharged to:: Private residence Living Arrangements: Alone   Type of Home: House Home Access: Level entry     Home Layout: One level Home Equipment: Cane - single point      Prior Function Level of Independence: Independent               Hand Dominance        Extremity/Trunk Assessment   Upper Extremity Assessment: Overall WFL for tasks assessed           Lower Extremity Assessment: Generalized weakness      Cervical / Trunk Assessment: Normal  Communication   Communication: No difficulties  Cognition Arousal/Alertness: Awake/alert Behavior During Therapy: WFL for tasks assessed/performed Overall Cognitive Status: Within Functional Limits for tasks assessed  General Comments      Exercises        Assessment/Plan    PT Assessment Patient needs continued PT services  PT Diagnosis Difficulty walking;Acute pain   PT Problem List Decreased strength;Decreased activity tolerance;Decreased balance;Decreased mobility;Cardiopulmonary status limiting activity;Decreased knowledge of use of DME;Pain  PT Treatment Interventions Gait training;DME instruction;Functional mobility training;Therapeutic activities;Therapeutic  exercise;Patient/family education;Balance training   PT Goals (Current goals can be found in the Care Plan section) Acute Rehab PT Goals Patient Stated Goal: be able to get my stomach pain better and go back to swimming PT Goal Formulation: With patient Time For Goal Achievement: 09/13/13 Potential to Achieve Goals: Fair    Frequency Min 3X/week   Barriers to discharge Decreased caregiver support      Co-evaluation               End of Session Equipment Utilized During Treatment: Gait belt Activity Tolerance: Patient tolerated treatment well Patient left: in bed;with call bell/phone within reach;with bed alarm set Nurse Communication: Mobility status         Time: 2694-8546 PT Time Calculation (min): 16 min   Charges:   PT Evaluation $Initial PT Evaluation Tier I: 1 Procedure PT Treatments $Therapeutic Activity: 8-22 mins   PT G Codes:          Jazlynn Nemetz B Liyanna Cartwright 08/30/2013, 2:25 PM Elwyn Reach, [place]

## 2013-08-31 DIAGNOSIS — R0989 Other specified symptoms and signs involving the circulatory and respiratory systems: Secondary | ICD-10-CM

## 2013-08-31 DIAGNOSIS — R0609 Other forms of dyspnea: Secondary | ICD-10-CM | POA: Diagnosis not present

## 2013-08-31 DIAGNOSIS — D649 Anemia, unspecified: Secondary | ICD-10-CM

## 2013-08-31 DIAGNOSIS — K859 Acute pancreatitis without necrosis or infection, unspecified: Secondary | ICD-10-CM | POA: Diagnosis not present

## 2013-08-31 LAB — CBC WITH DIFFERENTIAL/PLATELET
Basophils Absolute: 0 10*3/uL (ref 0.0–0.1)
Basophils Relative: 0 % (ref 0–1)
Eosinophils Absolute: 0.2 10*3/uL (ref 0.0–0.7)
Eosinophils Relative: 2 % (ref 0–5)
HCT: 31.7 % — ABNORMAL LOW (ref 39.0–52.0)
Hemoglobin: 10.2 g/dL — ABNORMAL LOW (ref 13.0–17.0)
Lymphocytes Relative: 13 % (ref 12–46)
Lymphs Abs: 1.3 10*3/uL (ref 0.7–4.0)
MCH: 26 pg (ref 26.0–34.0)
MCHC: 32.2 g/dL (ref 30.0–36.0)
MCV: 80.7 fL (ref 78.0–100.0)
Monocytes Absolute: 0.9 10*3/uL (ref 0.1–1.0)
Monocytes Relative: 9 % (ref 3–12)
Neutro Abs: 7.6 10*3/uL (ref 1.7–7.7)
Neutrophils Relative %: 76 % (ref 43–77)
Platelets: 182 10*3/uL (ref 150–400)
RBC: 3.93 MIL/uL — ABNORMAL LOW (ref 4.22–5.81)
RDW: 14.2 % (ref 11.5–15.5)
WBC: 9.9 10*3/uL (ref 4.0–10.5)

## 2013-08-31 LAB — COMPREHENSIVE METABOLIC PANEL
ALT: 23 U/L (ref 0–53)
AST: 20 U/L (ref 0–37)
Albumin: 2.9 g/dL — ABNORMAL LOW (ref 3.5–5.2)
Alkaline Phosphatase: 74 U/L (ref 39–117)
BUN: 6 mg/dL (ref 6–23)
CO2: 30 mEq/L (ref 19–32)
Calcium: 8.5 mg/dL (ref 8.4–10.5)
Chloride: 99 mEq/L (ref 96–112)
Creatinine, Ser: 0.56 mg/dL (ref 0.50–1.35)
GFR calc Af Amer: >90 mL/min (ref >90)
GFR calc non Af Amer: >90 mL/min (ref >90)
Glucose, Bld: 104 mg/dL — ABNORMAL HIGH (ref 70–99)
Potassium: 3.3 mEq/L — ABNORMAL LOW (ref 3.7–5.3)
Sodium: 139 mEq/L (ref 137–147)
Total Bilirubin: 0.4 mg/dL (ref 0.3–1.2)
Total Protein: 6.1 g/dL (ref 6.0–8.3)

## 2013-08-31 LAB — RETICULOCYTES
RBC.: 4.24 MIL/uL (ref 4.22–5.81)
Retic Count, Absolute: 89 10*3/uL (ref 19.0–186.0)
Retic Ct Pct: 2.1 % (ref 0.4–3.1)

## 2013-08-31 LAB — IRON AND TIBC
Iron: 20 ug/dL — ABNORMAL LOW (ref 42–135)
Saturation Ratios: 9 % — ABNORMAL LOW (ref 20–55)
TIBC: 222 ug/dL (ref 215–435)
UIBC: 202 ug/dL (ref 125–400)

## 2013-08-31 LAB — GLUCOSE, CAPILLARY: Glucose-Capillary: 95 mg/dL (ref 70–99)

## 2013-08-31 LAB — LACTATE DEHYDROGENASE: LDH: 281 U/L — ABNORMAL HIGH (ref 94–250)

## 2013-08-31 LAB — FOLATE: Folate: >20 ng/mL

## 2013-08-31 LAB — VITAMIN B12: Vitamin B-12: 1271 pg/mL — ABNORMAL HIGH (ref 211–911)

## 2013-08-31 LAB — FERRITIN: Ferritin: 414 ng/mL — ABNORMAL HIGH (ref 22–322)

## 2013-08-31 MED ORDER — POLYETHYLENE GLYCOL 3350 17 G PO PACK
17.0000 g | PACK | Freq: Two times a day (BID) | ORAL | Status: DC
  Filled 2013-08-31 (×4): qty 1

## 2013-08-31 MED ORDER — ADULT MULTIVITAMIN W/MINERALS CH
1.0000 | ORAL_TABLET | Freq: Every day | ORAL | Status: DC
  Administered 2013-09-01 – 2013-09-03 (×3): 1 via ORAL
  Filled 2013-08-31 (×3): qty 1

## 2013-08-31 MED ORDER — FOLIC ACID 1 MG PO TABS
1.0000 mg | ORAL_TABLET | Freq: Every day | ORAL | Status: DC
  Administered 2013-09-01 – 2013-09-03 (×3): 1 mg via ORAL
  Filled 2013-08-31 (×3): qty 1

## 2013-08-31 MED ORDER — POTASSIUM CHLORIDE CRYS ER 20 MEQ PO TBCR
40.0000 meq | EXTENDED_RELEASE_TABLET | Freq: Once | ORAL | Status: AC
  Administered 2013-08-31: 40 meq via ORAL
  Filled 2013-08-31: qty 2

## 2013-08-31 MED ORDER — HYDROMORPHONE HCL PF 1 MG/ML IJ SOLN
1.0000 mg | INTRAMUSCULAR | Status: DC | PRN
  Administered 2013-08-31 – 2013-09-02 (×14): 1 mg via INTRAVENOUS
  Filled 2013-08-31 (×15): qty 1

## 2013-08-31 MED ORDER — HYDROMORPHONE HCL PF 1 MG/ML IJ SOLN
1.5000 mg | INTRAMUSCULAR | Status: DC | PRN
  Administered 2013-08-31: 1.5 mg via INTRAVENOUS
  Filled 2013-08-31: qty 2

## 2013-08-31 MED ORDER — SENNOSIDES-DOCUSATE SODIUM 8.6-50 MG PO TABS: 1.0000 | ORAL_TABLET | Freq: Every day | ORAL | Status: DC

## 2013-08-31 NOTE — Clinical Social Work Psychosocial (Signed)
Clinical Social Work Department BRIEF PSYCHOSOCIAL ASSESSMENT 08/31/2013  Patient:  [name], [name]     Account Number:  [id number]     Admit date:  08/27/2013  Clinical Social Worker:  Lovey Newcomer  Date/Time:  08/31/2013 01:00 PM  Referred by:  Physician  Date Referred:  08/31/2013 Referred for  SNF Placement   Other Referral:   Interview type:  Patient Other interview type:   Patient alert and oriented at time of assessment.    PSYCHOSOCIAL DATA Living Status:  ALONE Admitted from facility:   Level of care:   Primary support name:  Carter Kitten Primary support relationship to patient:  FRIEND Degree of support available:   Support is fair.    CURRENT CONCERNS Current Concerns  Post-Acute Placement   Other Concerns:    SOCIAL WORK ASSESSMENT / PLAN CSW met with patient at bedside to complete assessment. Patient states that he is from home alone and states that he is ambivalent about SNF placement. Patient reports that he was very active prior to this hospitalization. Patient has limited family supports but states that he has several "good friends" that are supportive. CSW explained SNF search process and answered patient's questions. Patient still undecided about placement but would like to see the options.   Assessment/plan status:  Psychosocial Support/Ongoing Assessment of Needs Other assessment/ plan:   Complete FL2, Fax, PASRR   Information/referral to community resources:   CSW contact information and SNF list given to patient.    PATIENT'S/FAMILY'S RESPONSE TO PLAN OF CARE: Patient still unsure about whether he will go to SNF or return home. Patient was pleasant, appropriate, and appreciative of CSW visit. CSW will assist with DC if necessary.       Liz Beach MSW, [place], [place], [phone]

## 2013-08-31 NOTE — Progress Notes (Signed)
Resident on O2 via [place] 2L/min - sat O2=98%; at 1L/min - sat O2=97%; On room air - sat O2=901%.

## 2013-08-31 NOTE — Progress Notes (Signed)
Family Medicine Teaching Service Attending Note  I interviewed and examined patient [name] and reviewed their tests and x-rays.  I discussed with Dr. Raliegh Ip and reviewed their note for today.  I agree with their assessment and plan.     Additionally  Pancreatitis Was feeling better but now worse after eating Jello and drinking No nausea or vomiting Continue IVs until able to take orals with less pain  Anemia [name] in progress  Hypoxia Check O2 sats off O2   Patient does and friends who are his medical advisiors do not feel comfortable at [facility] hospital due to other medical interactions and request transfer.   We discussed options and they will investigate finding an accepting doctor with [place] to continue treatment in the mean time.  They voiced they did not have any concerns with out currrent treatment

## 2013-08-31 NOTE — Progress Notes (Signed)
Pt refusing CPAP for the night. He stated he didn't have one here to wear and when asked if he would try if RT brought one up he stated no he did not want to try. RT will make RN aware and will continue to monitor.

## 2013-08-31 NOTE — Clinical Social Work Placement (Signed)
Clinical Social Work Department CLINICAL SOCIAL WORK PLACEMENT NOTE 08/31/2013  Patient:  [name], [name]  Account Number:  [id number] Admit date:  08/27/2013  Clinical Social Worker:  Kemper Durie, [place]  Date/time:  08/31/2013 07:51 PM  Clinical Social Work is seeking post-discharge placement for this patient at the following level of care:   SKILLED NURSING   (*CSW will update this form in Epic as items are completed)   08/31/2013  Patient/family provided with [place] Department of Clinical Social Work's list of facilities offering this level of care within the geographic area requested by the patient (or if unable, by the patient's family).  08/31/2013  Patient/family informed of their freedom to choose among providers that offer the needed level of care, that participate in Medicare, Medicaid or managed care program needed by the patient, have an available bed and are willing to accept the patient.  08/31/2013  Patient/family informed of [facility]' ownership interest in [facility], as well as of the fact that they are under no obligation to receive care at this facility.  PASARR submitted to EDS on 08/31/2013 PASARR number received from EDS on 08/31/2013  FL2 transmitted to all facilities in geographic area requested by pt/family on  08/31/2013 FL2 transmitted to all facilities within larger geographic area on   Patient informed that his/her managed care company has contracts with or will negotiate with  certain facilities, including the following:     Patient/family informed of bed offers received:   Patient chooses bed at  Physician recommends and patient chooses bed at    Patient to be transferred to  on   Patient to be transferred to facility by   The following physician request were entered in Epic:   Additional Comments:    Liz Beach MSW, [place], [place], [phone]

## 2013-08-31 NOTE — Progress Notes (Signed)
Patient refusing to wear CPAP tonight.  Was told if he changed his mind to call RT. 

## 2013-08-31 NOTE — Progress Notes (Signed)
Family Medicine Teaching Service Daily Progress Note Intern Pager: [phone]  Patient name: [name] Medical record number: [id number] Date of birth: 08-01-1955 Age: 58 y.o. Gender: male  Primary Care Provider: Phill Myron, MD Consultants: None Code Status: Full  Pt Overview and Major Events to Date:  5/22 - Admit, acute pancreatitis on CT, lipase 1317, WBC 22.8 5/23 - Diet - Clear liquid, WBC 24.1 5/24 - Lipase 140, WBC 20.8 5/25 - Inc Dilaudid (previously not covered well due to narcotic use hx), WBC 18.5 5/26 - Dec Dilaudid, WBC 9.9  Assessment and Plan: [name] is a 58 y.o. male presenting with acute pancreatitis . PMH is significant for Hepatitis C, alcohol abuse, Bipolar Disorder, HTN.  #Acute Pancreatitis, with suspected associated acute leukocytosis - Improving  -  Unclear exact etiology, suspicion of EtOH, however pt denies any recent alcohol use (remote history several yrs ago), (possible Lamictal trigger?), no CBD dilation or gallbladder inflam on CT scan. Lipase improved 1317 >>> 140 - Clinically improved today with decreased pain overall, VSS, afebrile. However, abdominal exam with increased distention without significant abd pain, no concern for acute abd. +flatus (no BM). - Improved toleration of clear liquid diet today - Consider KUB to eval abd for likely ileus - Pain Control:       - Reduced Dilaudid from 3 to 1mg  IV q 2 hr (previous total 19mg  in 24 hours), complicated by hx narcotic abuse       - DC'd Percocet, plan to control pain IV, improve diet, then switch to PO meds       - Increased bowel regimen, Miralax BID, [place] nightly - Dec IVF NS to 75 cc/hr - Improved WBC 18.5--> 9.9 - Daily CBC and CMP, stable LFTs - Zofran PRN for nausea/vomiting - PT eval - rec HHPT, if unable to arrange home assistance / limited mobility may need short term SNF  # Acute Anemia, normocytic - Baseline Hgb 13-15, on admission Hgb 13.9, remained relatively stable until  5/24 with acute drop from 13.0 to 11.3. No evidence of acute bleed, noted no BM yet. No significant change in symptoms, without fatigue / dizziness, stable vitals, not tachycardic. Differential includes possible acute blood loss anemia, less likely hemolytic anemia, and likely contribution from dilutional component. - Decreased Hgb 11.3-->11.8-->10.2 - Daily CBC, trend Hgb - FOBT - Ordered iron studies: ferritin, TIBC, iron lvl - Ordered Haptoglobin, LDH - to eval hemolysis - Ordered Folate, Vitamin B12 (hx EtOH abuse, although noted not macrocytic)  # Hypoxemia, with questionable new O2 req - Pt initially placed on O2 for comfort during episode of chest discomfort / anxiety on 5/23. Notable new findings with attempted titrate down on O2 o/n to 89% on RA (@ 2100), however patient does have hx OSA - currently on 2L O2 [place] - requested formal O2 req assessment per nursing, determine if still req O2 - If persistent O2 req, consider further w/u with ABG, CXR  #Hx of Polysubstance Abuse/ Chronic Opioid Dependence, complicating management of pancreatitis  - Pt with significant substance abuse history  - UDS (positive, BDZ only) - Pain medication as above  #Hx of Hepatitis C  - LFT's WNL - Will continue to monitor; will need outpatient followup regarding this.  #Hx of Bipolar Disorder/anxiety disorder  - Continue home Lamictal 200mg  PO daily - Ativan IV PRN for anxiety  #Hx of Alcohol Abuse  - CIWA, not scoring, no ativan recently  # OSA - Ordered CPAP, pt refused but states  he was unaware, asked to request tonight, still ordered  # Hypokalemia  - K 3.3 - Ordered potassium 60mEq PO  FEN/GI: Clear liquid, NS @ 75 cc/hr  Prophylaxis: SQ heparin  Disposition: Admitted for acute pancreatitis; Home pending clinical improvement. Continued difficult pain management given prior narcotic use history, requiring higher doses to achieve response. Continue to manage pain / advance diet, goal to  transition to PO pain meds and tolerating PO prior to discharge.  Subjective:  Significant improvement today. Patient reported pain much better controlled yesterday and overnight. This morning still with generalized abdominal and central LBP, but controlled on Dilaudid. Feels Oxycodone makes his "head feel different, causes difficulty with thoughts". Improved PO toleration with clear liquid diet, minimal ambulation, resolved nausea, passing +flatus, no BM.  Objective: Temp:  [98.1 F (36.7 C)-98.2 F (36.8 C)] 98.2 F (36.8 C) (05/26 0513) Pulse Rate:  [85-100] 90 (05/26 0513) Resp:  [15-18] 15 (05/26 0513) BP: (112-154)/(73-90) 129/76 mmHg (05/26 0513) SpO2:  [89 %-99 %] 97 % (05/26 0513)  Physical Exam: General: lying on right side in bed sipping broth and clear liquid tray, comfortable and cooperative, NAD Cardiovascular: RRR, no murmurs appreciated  Respiratory: CTAB, nml effort Abdomen: Soft, mildly increased distention, minimal epigastric tenderness, otherwise unable to elicit significant tenderness on exam, no rebound tenderness, +active BS in all quads  Extremities: No edema, non tender Neuro: awake, alert, oriented, grossly non-focal, intact muscle str 5/5  Laboratory:  Recent Labs Lab 08/29/13 0745 08/30/13 0512 08/31/13 0638  WBC 20.8* 18.5* 9.9  HGB 11.3* 11.8* 10.2*  HCT 35.2* 35.4* 31.7*  PLT 171 186 182    Recent Labs Lab 08/29/13 0745 08/30/13 0512 08/31/13 0638  NA 137 136* 139  K 3.5* 3.5* 3.3*  CL 100 99 99  CO2 25 28 30   BUN 11 7 6   CREATININE 0.67 0.55 0.56  CALCIUM 7.9* 8.4 8.5  PROT 6.1 6.7 6.1  BILITOT 0.5 0.5 0.4  ALKPHOS 48 55 74  ALT 24 25 23   AST 23 22 20   GLUCOSE 107* 114* 104*   Lipase - 1317 (5/22) -->140  5/22 UA - Unremarkable 5/22 UDS - only BDZ (positive)  Troponin-I - neg Trop-POCT - neg TG - 96  Imaging/Diagnostic Tests:  5/22 CT Abd / Pelvis IMPRESSION:  1. Findings suggestive of acute pancreatitis. Correlation  with serum  lipase recommended. No loculated fluid collection or evidence of  pancreatic necrosis.  2. No other acute intra-abdominal or pelvic abnormality.  3. Hepatic steatosis.  4. 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  5/22 CXR 2v IMPRESSION:  No active cardiopulmonary disease.  5/22 EKG NSR, HR 63, no acute ST-T wave changes.   Nobie Putnam, DO 08/31/2013, 9:37 AM PGY-1, [place] Intern pager: [phone], text pages welcome

## 2013-08-31 NOTE — Progress Notes (Signed)
Family Practice Teaching Service Interval Progress Note  Went to pt's room to examine abdomen. Patient sleeping soundly. Did not awaken to voice, but awoke when I examined his abdomen. Abd is full/bloated but is compressible, not rigid. No marked tenderness on exam in any particular area, pt just grunts with all forms of palpation. Quickly fell back asleep when I stopped palpating abdomen.  Will hold off on additional imaging at this time and continue to monitor.  Chrisandra Netters, MD Family Medicine PGY-2 Service Pager [phone]

## 2013-09-01 ENCOUNTER — Inpatient Hospital Stay (HOSPITAL_COMMUNITY): Payer: Medicare Other

## 2013-09-01 DIAGNOSIS — K859 Acute pancreatitis without necrosis or infection, unspecified: Secondary | ICD-10-CM | POA: Diagnosis not present

## 2013-09-01 LAB — CBC WITH DIFFERENTIAL/PLATELET
Basophils Absolute: 0 10*3/uL (ref 0.0–0.1)
Basophils Relative: 0 % (ref 0–1)
Eosinophils Absolute: 0.3 10*3/uL (ref 0.0–0.7)
Eosinophils Relative: 3 % (ref 0–5)
HCT: 35 % — ABNORMAL LOW (ref 39.0–52.0)
Hemoglobin: 11.5 g/dL — ABNORMAL LOW (ref 13.0–17.0)
Lymphocytes Relative: 16 % (ref 12–46)
Lymphs Abs: 1.6 10*3/uL (ref 0.7–4.0)
MCH: 26.3 pg (ref 26.0–34.0)
MCHC: 32.9 g/dL (ref 30.0–36.0)
MCV: 79.9 fL (ref 78.0–100.0)
Monocytes Absolute: 1.2 10*3/uL — ABNORMAL HIGH (ref 0.1–1.0)
Monocytes Relative: 12 % (ref 3–12)
Neutro Abs: 7.2 10*3/uL (ref 1.7–7.7)
Neutrophils Relative %: 69 % (ref 43–77)
Platelets: 225 10*3/uL (ref 150–400)
RBC: 4.38 MIL/uL (ref 4.22–5.81)
RDW: 14.3 % (ref 11.5–15.5)
WBC: 10.4 10*3/uL (ref 4.0–10.5)

## 2013-09-01 LAB — HAPTOGLOBIN: Haptoglobin: 275 mg/dL — ABNORMAL HIGH (ref 45–215)

## 2013-09-01 LAB — COMPREHENSIVE METABOLIC PANEL
ALT: 30 U/L (ref 0–53)
AST: 26 U/L (ref 0–37)
Albumin: 2.9 g/dL — ABNORMAL LOW (ref 3.5–5.2)
Alkaline Phosphatase: 72 U/L (ref 39–117)
BUN: 6 mg/dL (ref 6–23)
CO2: 29 mEq/L (ref 19–32)
Calcium: 8.6 mg/dL (ref 8.4–10.5)
Chloride: 100 mEq/L (ref 96–112)
Creatinine, Ser: 0.58 mg/dL (ref 0.50–1.35)
GFR calc Af Amer: >90 mL/min (ref >90)
GFR calc non Af Amer: >90 mL/min (ref >90)
Glucose, Bld: 88 mg/dL (ref 70–99)
Potassium: 3.4 mEq/L — ABNORMAL LOW (ref 3.7–5.3)
Sodium: 141 mEq/L (ref 137–147)
Total Bilirubin: 0.4 mg/dL (ref 0.3–1.2)
Total Protein: 6.4 g/dL (ref 6.0–8.3)

## 2013-09-01 LAB — GLUCOSE, CAPILLARY: Glucose-Capillary: 95 mg/dL (ref 70–99)

## 2013-09-01 MED ORDER — POTASSIUM CHLORIDE CRYS ER 20 MEQ PO TBCR
40.0000 meq | EXTENDED_RELEASE_TABLET | Freq: Once | ORAL | Status: AC
  Administered 2013-09-01: 40 meq via ORAL
  Filled 2013-09-01: qty 2

## 2013-09-01 MED ORDER — SENNOSIDES-DOCUSATE SODIUM 8.6-50 MG PO TABS
1.0000 | ORAL_TABLET | Freq: Two times a day (BID) | ORAL | Status: DC
  Administered 2013-09-01 – 2013-09-02 (×3): 1 via ORAL
  Filled 2013-09-01 (×3): qty 1

## 2013-09-01 MED ORDER — HYDROMORPHONE HCL PF 1 MG/ML IJ SOLN
1.0000 mg | Freq: Once | INTRAMUSCULAR | Status: AC
  Administered 2013-09-01: 1 mg via INTRAVENOUS
  Filled 2013-09-01: qty 1

## 2013-09-01 MED ORDER — BLISTEX MEDICATED EX OINT
TOPICAL_OINTMENT | CUTANEOUS | Status: DC | PRN
  Filled 2013-09-01: qty 10

## 2013-09-01 NOTE — Progress Notes (Signed)
Physical Therapy Treatment Patient Details Name: [name] MRN: [id number] DOB: [birth year] Today's Date: 07-Sep-2013    History of Present Illness [name] is a 58 y.o. male presenting with acute pancreatitis . PMH is significant for Hepatitis C, alcohol abuse, Bipolar Disorder, HTN    PT Comments    Pt with 9/10 back pain limiting gait today.  No LOB with gait with RW.   Do not feel pt is safe to go home at this time and recommend SNF.  Follow Up Recommendations  SNF     Equipment Recommendations  Rolling walker with 5" wheels    Recommendations for Other Services       Precautions / Restrictions Precautions Precautions: Fall    Mobility  Bed Mobility   Bed Mobility: Rolling;Sidelying to Sit Rolling: Supervision Sidelying to sit: Min guard     Sit to sidelying: Min guard General bed mobility comments:  (pt with good recall of log rolling)  Transfers Overall transfer level: Needs assistance     Sit to Stand: Min guard         General transfer comment:  (very guarded)  Ambulation/Gait Ambulation/Gait assistance: Min guard;Min assist Ambulation Distance (Feet): 60 Feet Assistive device: Rolling walker (2 wheeled) Gait Pattern/deviations: Decreased step length - right;Decreased step length - left;Antalgic (guarded) Gait velocity: decreased   General Gait Details: No LOB today with RW but slow and guarded gait with 9/10 back pain and decreased distance compared to yesterday.  O2 level 95% on room air after gait.   Stairs            Wheelchair Mobility    Modified Rankin (Stroke Patients Only)       Balance             Standing balance-Leahy Scale: Poor (due to pain)                      Cognition Arousal/Alertness: Awake/alert Behavior During Therapy: WFL for tasks assessed/performed Overall Cognitive Status: Within Functional Limits for tasks assessed                      Exercises      General Comments         Pertinent Vitals/Pain 9/10 back pain.  Nurse gave pain meds.    Home Living                      Prior Function            PT Goals (current goals can now be found in the care plan section) Progress towards PT goals: Not progressing toward goals - comment (limited by pain.Decreased gait distance.)    Frequency  Min 3X/week    PT Plan Current plan remains appropriate    Co-evaluation             End of Session Equipment Utilized During Treatment: Gait belt Activity Tolerance: Patient limited by pain Patient left: in bed;with call bell/phone within reach;with family/visitor present     Time: 4401-0272 PT Time Calculation (min): 16 min  Charges:  $Gait Training: 8-22 mins                    G Codes:      [name] 07-Sep-2013, 11:06 AM

## 2013-09-01 NOTE — Progress Notes (Signed)
Patient will mostly likely want to discharge home  With home health, however had no fully decided his disposition. He stated will like to go th [facility] if he chooses the SNF route.

## 2013-09-01 NOTE — Clinical Social Work Placement (Signed)
Clinical Social Work Department CLINICAL SOCIAL WORK PLACEMENT NOTE 09/01/2013  Patient:  [name], [name]  Account Number:  [id number] Admit date:  08/27/2013  Clinical Social Worker:  Kemper Durie, [place]  Date/time:  08/31/2013 07:51 PM  Clinical Social Work is seeking post-discharge placement for this patient at the following level of care:   SKILLED NURSING   (*CSW will update this form in Epic as items are completed)   08/31/2013  Patient/family provided with [place] Department of Clinical Social Work's list of facilities offering this level of care within the geographic area requested by the patient (or if unable, by the patient's family).  08/31/2013  Patient/family informed of their freedom to choose among providers that offer the needed level of care, that participate in Medicare, Medicaid or managed care program needed by the patient, have an available bed and are willing to accept the patient.  08/31/2013  Patient/family informed of [facility]' ownership interest in [facility], as well as of the fact that they are under no obligation to receive care at this facility.  PASARR submitted to EDS on 08/31/2013 PASARR number received from EDS on 08/31/2013  FL2 transmitted to all facilities in geographic area requested by pt/family on  08/31/2013 FL2 transmitted to all facilities within larger geographic area on   Patient informed that his/her managed care company has contracts with or will negotiate with  certain facilities, including the following:     Patient/family informed of bed offers received:  09/01/2013 Patient chooses bed at  Physician recommends and patient chooses bed at    Patient to be transferred to  on   Patient to be transferred to facility by   The following physician request were entered in Epic:   Additional Comments: Patient was given bed offers at 2:30pm on 09/01/13. CSW instructed patient to be prepared to give CSW SNF decision  by 09/02/13.    Liz Beach MSW, [place], [place], [id number]

## 2013-09-01 NOTE — Progress Notes (Signed)
Family Medicine Teaching Service Daily Progress Note Intern Pager: [phone]  Patient name: [name] Medical record number: [id number] Date of birth: 01-14-1956 Age: 58 y.o. Gender: male  Primary Care Provider: Phill Myron, MD Consultants: None Code Status: Full  Pt Overview and Major Events to Date:  5/22 - Admit, acute pancreatitis on CT, lipase 1317, WBC 22.8 5/23 - Diet - Clear liquid, WBC 24.1 5/24 - Lipase 140, WBC 20.8 5/25 - Inc Dilaudid (previously not covered well due to narcotic use hx), WBC 18.5 5/26 - Dec Dilaudid, WBC 9.9, Hgb 11.8-->10.2 5/27 - inc Hgb 10.2-->11.5, Anemia w/u with chronic disease, neg hemolysis         - pain not improved, remain NPO, inc IVF  Assessment and Plan: [name] is a 58 y.o. male presenting with acute pancreatitis . PMH is significant for Hepatitis C, alcohol abuse, Bipolar Disorder, HTN.  #Acute Pancreatitis, with suspected associated acute leukocytosis - Unchanged  -  Unclear exact etiology, suspicion of EtOH, however pt denies any recent alcohol use (remote history several yrs ago), (possible Lamictal trigger?), no CBD dilation or gallbladder inflam on CT scan. Lipase improved 1317 >>> 140 - Clinically unchanged today with some c/o increased pain, VSS, afebrile - Pain Control:       - Continue Dilaudid 1mg  IV q 2 hr (previous total 12.5mg  in 24 hours), complicated by hx narcotic abuse       - DC'd Percocet, plan to control pain IV, improve diet, then switch to PO meds       - Bowel regimen - [place]ot BID (previously not receiving d/t NPO), patient refused Miralax multiple times - Inc IVF NS to 125 cc/hr (remains NPO >24 hrs) - WBC stable - Daily CBC and CMP, stable LFTs - Zofran PRN for nausea/vomiting - PT eval - rec SNF, likely for short term rehab, patient agreeable at this time  # Acute on chronic anemia, consistent with anemia of chronic disease - Improved - Baseline Hgb 13-15, on admission Hgb 13.9, remained relatively  stable until 5/24 with acute drop from 13.0 to 11.3. No evidence of acute bleed, noted no BM yet. No significant change in symptoms, without fatigue / dizziness, stable vitals, not tachycardic. Differential includes possible acute blood loss anemia, less likely hemolytic anemia, and likely contribution from dilutional component. - Improved Hgb 10.2 --> 11.5 - Daily CBC, trend Hgb - FOBT (pending BM) - Iron Studies: Elevated ferritin (414), low iron (20), dec sat ratio 9 - Hemolysis labs (negative), mild elevated LDH 281, high haptoglobin 275, nml Retic. Not consistent with hemolysis - Folate >20, Vitamin B12 1271 (elevated) - Not consistent with deficiencies  # Transient hypoxemia, likely secondary to night-time OSA - Stable - Pt initially placed on O2 for comfort during episode of chest discomfort / anxiety on 5/23. Notable new findings with attempted titrate down on O2 o/n to 89% on RA (@ 2100), however patient does have hx OSA. Chart review with documented pulmonology follow-up without significant objective findings for CLD on testing, however determined to have difficult airway disease - Currently tolerated trial off [place] well without desat, documented >90% on RA at rest - DC'd pulse oximetry at this time - 5/27 - CXR consistent with small new left pleural effusion, otherwise no acute air space disease - Start incentive spiro q 4 hr  #Hx of Polysubstance Abuse/ Chronic Opioid Dependence, complicating management of pancreatitis  - Pt with significant substance abuse history  - UDS (positive, BDZ only) - Pain  medication as above  #Hx of Hepatitis C  - LFT's WNL - Will continue to monitor; will need outpatient followup regarding this.  #Hx of Bipolar Disorder/anxiety disorder  - Continue home Lamictal 200mg  PO daily - Ativan IV PRN for anxiety  #Hx of Alcohol Abuse  - DC'd CIWA  # OSA - Ordered CPAP, pt refused but states he was unaware, asked to request tonight, still ordered  #  Hypokalemia  - K 3.3-->3.4 - Re-ordered potassium 1mEq PO  FEN/GI: NPO with meds / sips, NS inc to 125 cc/hr  Prophylaxis: SQ heparin  Disposition: Admitted for acute pancreatitis; Home pending clinical improvement. Continued difficult pain management given prior narcotic use history, requiring higher doses to achieve response. Continue to manage pain / advance diet, goal to transition to PO pain meds and tolerating PO prior to discharge.  Subjective:  No improvement today, pt states feels worse, persistent generalized abdominal and low back pain, worse with movements. Denies any other associated symptoms. He states he has not been asking for pain medicine frequently because he did not want to take too much. Remains NPO, off O2 [place], agreeable to SNF for short term rehab if needed. +flatus, no BM.  Objective: Temp:  [97.9 F (36.6 C)-98.3 F (36.8 C)] 98.2 F (36.8 C) (05/27 0341) Pulse Rate:  [82-100] 82 (05/27 0341) Resp:  [16-18] 18 (05/27 0341) BP: (131-151)/(87-89) 149/88 mmHg (05/27 0341) SpO2:  [91 %-98 %] 98 % (05/27 0341)  Physical Exam: General: lying on back, uncomfortable and cooperative, NAD Cardiovascular: RRR, no murmurs appreciated  Respiratory: CTAB, nml effort, stable mild diminished breath sounds b/l bases Abdomen: Soft, stable mild distended abd, tenderness >epigastric, but generalized, no rebound tenderness, +hypoactive BS in all quads. +multiple ecchymosis from SQ heparin Extremities: No edema, non tender Neuro: awake, alert, oriented, grossly non-focal, intact muscle str 5/5  Laboratory:  Recent Labs Lab 08/30/13 0512 08/31/13 0638 09/01/13 0735  WBC 18.5* 9.9 10.4  HGB 11.8* 10.2* 11.5*  HCT 35.4* 31.7* 35.0*  PLT 186 182 225    Recent Labs Lab 08/30/13 0512 08/31/13 0638 09/01/13 0735  NA 136* 139 141  K 3.5* 3.3* 3.4*  CL 99 99 100  CO2 28 30 29   BUN 7 6 6   CREATININE 0.55 0.56 0.58  CALCIUM 8.4 8.5 8.6  PROT 6.7 6.1 6.4  BILITOT 0.5  0.4 0.4  ALKPHOS 55 74 72  ALT 25 23 30   AST 22 20 26   GLUCOSE 114* 104* 88   Lipase - 1317 (5/22) -->140  5/22 UA - Unremarkable 5/22 UDS - only BDZ (positive)  Troponin-I - neg Trop-POCT - neg TG - 96  - Iron Studies: Elevated ferritin (414), low iron (20), dec sat ratio 9 - Hemolysis labs (negative), mild elevated LDH 281, high haptoglobin 275 - Folate >20, Vitamin B12 1271 (elevated)  Imaging/Diagnostic Tests:  5/22 CT Abd / Pelvis IMPRESSION:  1. Findings suggestive of acute pancreatitis. Correlation with serum  lipase recommended. No loculated fluid collection or evidence of  pancreatic necrosis.  2. No other acute intra-abdominal or pelvic abnormality.  3. Hepatic steatosis.  4. 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  5/22 CXR 2v IMPRESSION:  No active cardiopulmonary disease.  5/22 EKG NSR, HR 63, no acute ST-T wave changes.   5/27 Chest Xray 2v IMPRESSION:  1. New, small left pleural effusion.  2. Shallow lung inflation without evidence of acute airspace  disease.  Nobie Putnam, DO 09/01/2013, 9:59 AM  PGY-1, [place] Intern pager: [phone], text pages welcome

## 2013-09-01 NOTE — Progress Notes (Signed)
Pt refusing cpap for the night. Pt states that he does fine without it and did not want me to set one up. Will continue to monitor.

## 2013-09-02 DIAGNOSIS — F319 Bipolar disorder, unspecified: Secondary | ICD-10-CM | POA: Diagnosis not present

## 2013-09-02 DIAGNOSIS — K859 Acute pancreatitis without necrosis or infection, unspecified: Secondary | ICD-10-CM | POA: Diagnosis not present

## 2013-09-02 DIAGNOSIS — M545 Low back pain, unspecified: Secondary | ICD-10-CM | POA: Diagnosis not present

## 2013-09-02 DIAGNOSIS — F1010 Alcohol abuse, uncomplicated: Secondary | ICD-10-CM | POA: Diagnosis not present

## 2013-09-02 LAB — COMPREHENSIVE METABOLIC PANEL
ALT: 30 U/L (ref 0–53)
AST: 24 U/L (ref 0–37)
Albumin: 3 g/dL — ABNORMAL LOW (ref 3.5–5.2)
Alkaline Phosphatase: 73 U/L (ref 39–117)
BUN: 8 mg/dL (ref 6–23)
CO2: 24 mEq/L (ref 19–32)
Calcium: 8.9 mg/dL (ref 8.4–10.5)
Chloride: 101 mEq/L (ref 96–112)
Creatinine, Ser: 0.54 mg/dL (ref 0.50–1.35)
GFR calc Af Amer: >90 mL/min (ref >90)
GFR calc non Af Amer: >90 mL/min (ref >90)
Glucose, Bld: 84 mg/dL (ref 70–99)
Potassium: 4.1 mEq/L (ref 3.7–5.3)
Sodium: 140 mEq/L (ref 137–147)
Total Bilirubin: 0.4 mg/dL (ref 0.3–1.2)
Total Protein: 6.7 g/dL (ref 6.0–8.3)

## 2013-09-02 LAB — CBC WITH DIFFERENTIAL/PLATELET
Basophils Absolute: 0 10*3/uL (ref 0.0–0.1)
Basophils Relative: 0 % (ref 0–1)
Eosinophils Absolute: 0.3 10*3/uL (ref 0.0–0.7)
Eosinophils Relative: 2 % (ref 0–5)
HCT: 34.9 % — ABNORMAL LOW (ref 39.0–52.0)
Hemoglobin: 11.6 g/dL — ABNORMAL LOW (ref 13.0–17.0)
Lymphocytes Relative: 14 % (ref 12–46)
Lymphs Abs: 1.7 10*3/uL (ref 0.7–4.0)
MCH: 26.1 pg (ref 26.0–34.0)
MCHC: 33.2 g/dL (ref 30.0–36.0)
MCV: 78.4 fL (ref 78.0–100.0)
Monocytes Absolute: 1.2 10*3/uL — ABNORMAL HIGH (ref 0.1–1.0)
Monocytes Relative: 11 % (ref 3–12)
Neutro Abs: 8.6 10*3/uL — ABNORMAL HIGH (ref 1.7–7.7)
Neutrophils Relative %: 73 % (ref 43–77)
Platelets: 260 10*3/uL (ref 150–400)
RBC: 4.45 MIL/uL (ref 4.22–5.81)
RDW: 14.2 % (ref 11.5–15.5)
WBC: 11.8 10*3/uL — ABNORMAL HIGH (ref 4.0–10.5)

## 2013-09-02 LAB — GLUCOSE, CAPILLARY: Glucose-Capillary: 93 mg/dL (ref 70–99)

## 2013-09-02 MED ORDER — PANTOPRAZOLE SODIUM 40 MG PO TBEC
40.0000 mg | DELAYED_RELEASE_TABLET | Freq: Every day | ORAL | Status: DC
  Administered 2013-09-02 – 2013-09-03 (×2): 40 mg via ORAL
  Filled 2013-09-02 (×2): qty 1

## 2013-09-02 MED ORDER — OXYCODONE HCL 5 MG PO TABS
5.0000 mg | ORAL_TABLET | ORAL | Status: DC | PRN
  Administered 2013-09-02 – 2013-09-03 (×4): 10 mg via ORAL
  Filled 2013-09-02 (×4): qty 2

## 2013-09-02 MED ORDER — HYDROMORPHONE HCL PF 1 MG/ML IJ SOLN
0.5000 mg | INTRAMUSCULAR | Status: AC | PRN
  Administered 2013-09-02 – 2013-09-03 (×4): 0.5 mg via INTRAVENOUS
  Filled 2013-09-02 (×4): qty 1

## 2013-09-02 MED ORDER — POLYETHYLENE GLYCOL 3350 17 G PO PACK
17.0000 g | PACK | Freq: Every day | ORAL | Status: DC
  Administered 2013-09-03: 17 g via ORAL
  Filled 2013-09-02: qty 1

## 2013-09-02 NOTE — Progress Notes (Signed)
Family Medicine Teaching Service Attending Note  I interviewed and examined patient [name] and reviewed their tests and x-rays.  I discussed with Dr. Raliegh Ip and reviewed their note for today.  I agree with their assessment and plan.     Additionally  As pain subsides slowly increase diet Case is complicated by his chronic back pain and narcotic use

## 2013-09-02 NOTE — Discharge Summary (Signed)
[facility] Discharge Summary  Patient name: [name] Medical record number: [id number] Date of birth: 11-10-1955 Age: 58 y.o. Gender: male Date of Admission: 08/27/2013  Date of Discharge: 09/03/13 Admitting Physician: Willeen Niece, MD  Primary Care Provider: Phill Myron, MD Consultants: None  Indication for Hospitalization: Abdominal pain with acute pancreatitis  Discharge Diagnoses/Problem List:  Acute pancreatitis, idiopathic with unknown etiology - Improved Chronic Pain Syndrome, with chronic low back pain H/o Polysubstance Abuse / Chronic Opioid Dependence Acute on chronic anemia, consistent with anemia of chronic disease - Improved OSA, untreated with transient hypoxemia GERD H/o Hepatitis C Hypokalemia, resolved  Disposition: Home, with home health PT  Discharge Condition: Stable  Discharge Exam: General: sitting up on side of bed, cooperative, NAD  Cardiovascular: RRR, no murmurs appreciated  Respiratory: CTAB, nml effort, stable mild diminished breath sounds b/l bases  Abdomen: Soft, no longer distended, minimal TTP, no rebound tenderness, +active BS in all quads. +multiple ecchymosis from SQ heparin  Extremities: No edema, non tender Neuro: awake, alert, oriented, grossly non-focal, intact muscle str 5/5   Brief Hospital Course:  [name] is a 58 y.o. male presented with acute worsening abdominal pain and recent CP found to have acute pancreatitis. PMH is significant for chronic pain syndrome with low back pain, chronic opioid dependence, h/o polysubstance abuse, Hepatitis C, h/o alcohol abuse (denies recent EtOH use), Bipolar Disorder, HTN. Patient initially presented with worsening epigastric pain went to [facility]-ED ruled out for CP with negative troponin and EKG, additionally recent normal exercise-tolerance test in 09/2012. Returned to ED with worsening abdominal pain and vomiting, further work-up confirmed acute pancreatitis with  elevated lipase 1317 and confirmed on CT Abdomen. Note prior h/o pancreatitis, no gallstones seen, denies EtOH, nml TG level, unlikely medication induced. Additionally WBC 22, otherwise work-up negative (troponins, EKG, CMET). Initial treatment with NPO, IVF rehydration, Dilaudid IV for pain control, placed on CIWA protocol (without scoring, since discontinued). Pain management course very complicated due to chronic opioid dependence with intractable low back pain, previously tried self-weaning off of home oxycodone but experiencing withdrawal symptoms. Pain poorly controlled on low dose Dilaudid IV, difficult since patient admitted to "dealing with the pain" instead of requesting doses. Continued to increase Dilaudid IV until pain controlled (max dose 3mg  IV q 2 hours PRN, with highest 24 hour total received Dilaudid 20mg  IV total), attempted early trial of clear liquid PO and transition to PO pain meds, however unsuccessful with acute worsening of abdominal pain. Additionally, persistent leukocytosis suspected to be related to pancreatitis, otherwise afebrile and no infiltrates on CXR, gradually resolved. Hospital course also with acute worsening of anemia of chronic disease (obtained iron studies confirming diagnosis), no evidence of active bleed, monitored Hgb with improvement. Also, known OSA with refusal to wear CPAP ordered at night, occasional transient hypoxemia, however confirmed no O2 req.  By hospital day #6 patient with notable improvement in abdominal pain and start of clear liquid diet, continued to wean off of Dilaudid IV and successfully transitioned to PO Oxycodone to continue on discharge. Contacted Dr. Hardin Negus ([facility]) who currently prescribes patient's opioid pain medicine, discussed case and arranged close 1 week follow-up, received authorization to prescribe 1 week supply Oxycodone PO to cover patient until followed up in clinic. At time of discharge, patient with stable  vitals, tolerating full liquid diet, oral pain medication, ambulation, voiding and had BM. Given reduced functional status due to pain, PT had recommended SNF, patient declined and  opted for home health PT.  Issues for Follow Up:  1. Chronic Opiate Dependence / Pain Management - New prescription on discharge, Oxycodone PO 5-10mg  q 4 hr PRN (#60, 0 refills) with plan to taper down, discussed and confirmed with Dr. Hardin Negus, with close follow-up arranged for 09/09/13.  2. Pancreatitis - No identified trigger. Gradual return to full diet as tolerated.  3. Liver Disease: H/o Hepatitis C and hepatic steatosis on CT. Recommend follow-up for Hep C.  4. OSA - Consider CPAP (pt h/o refusal in past)  Significant Procedures: none  Significant Labs and Imaging:   Recent Labs Lab 09/01/13 0735 09/02/13 0805 09/03/13 0811  WBC 10.4 11.8* 10.5  HGB 11.5* 11.6* 11.4*  HCT 35.0* 34.9* 35.1*  PLT 225 260 287    Recent Labs Lab 08/29/13 0745 08/30/13 0512 08/31/13 0638 09/01/13 0735 09/02/13 0608  NA 137 136* 139 141 140  K 3.5* 3.5* 3.3* 3.4* 4.1  CL 100 99 99 100 101  CO2 25 28 30 29 24   GLUCOSE 107* 114* 104* 88 84  BUN 11 7 6 6 8   CREATININE 0.67 0.55 0.56 0.58 0.54  CALCIUM 7.9* 8.4 8.5 8.6 8.9  ALKPHOS 48 55 74 72 73  AST 23 22 20 26 24   ALT 24 25 23 30 30   ALBUMIN 3.0* 3.1* 2.9* 2.9* 3.0*   Lipase - 1317 (5/22) -->140  5/22 UA - Unremarkable  5/22 UDS - only BDZ (positive)  Troponin-I - neg  Trop-POCT - neg  TG - 96  - Iron Studies: Elevated ferritin (414), low iron (20), dec sat ratio 9  - Hemolysis labs (negative), mild elevated LDH 281, high haptoglobin 275  - Folate >20, Vitamin B12 1271 (elevated)  Imaging/Diagnostic Tests:  5/22 CT Abd / Pelvis  IMPRESSION:  1. Findings suggestive of acute pancreatitis. Correlation with serum  lipase recommended. No loculated fluid collection or evidence of  pancreatic necrosis.  2. No other acute intra-abdominal or pelvic  abnormality.  3. Hepatic steatosis.  4. 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  5/22 CXR 2v  IMPRESSION:  No active cardiopulmonary disease.  5/22 EKG  NSR, HR 63, no acute ST-T wave changes.  5/27 Chest Xray 2v  IMPRESSION:  1. New, small left pleural effusion.  2. Shallow lung inflation without evidence of acute airspace  disease.  Results/Tests Pending at Time of Discharge: none  Discharge Medications:    Medication List    ASK your doctor about these medications       ALPRAZolam 0.5 MG tablet  Commonly known as:  XANAX  Take 0.167 mg by mouth 3 (three) times daily.     aspirin EC 81 MG tablet  Take 81 mg by mouth daily.     atenolol 50 MG tablet  Commonly known as:  TENORMIN  Take 50 mg by mouth daily as needed (if systolic blood pressure over 135).     Fiber Powd  Take 1 packet by mouth daily as needed (constipation). Mix in 8 oz liquid and drink     fluticasone 50 MCG/ACT nasal spray  Commonly known as:  FLONASE  Place 2 sprays into both nostrils 3 (three) times daily as needed for allergies.     gabapentin 300 MG capsule  Commonly known as:  NEURONTIN  Take 300-600 mg by mouth 3 (three) times daily. Take 1 capsule (300 mg) twice daily, and take 2 capsules (600 mg) at bedtime     ibuprofen 200  MG tablet  Commonly known as:  ADVIL,MOTRIN  Take 400-800 mg by mouth 3 (three) times daily as needed (pain).     lamoTRIgine 200 MG tablet  Commonly known as:  LAMICTAL  Take 200 mg by mouth at bedtime.     lidocaine 5 %  Commonly known as:  LIDODERM  Place 2 patches onto the skin daily.     Melatonin 10 MG Caps  Take 10 mg by mouth at bedtime as needed (sleep).     mirtazapine 45 MG tablet  Commonly known as:  REMERON  Take 45 mg by mouth at bedtime.     multivitamin with minerals Tabs tablet  Take 1 tablet by mouth daily.     omeprazole 20 MG capsule  Commonly known as:  PRILOSEC  Take 20 mg by mouth at bedtime.      oxyCODONE-acetaminophen 5-325 MG per tablet  Commonly known as:  PERCOCET/ROXICET  Take 1-2 tablets by mouth every 6 (six) hours as needed (pain).     promethazine 25 MG tablet  Commonly known as:  PHENERGAN  Take 1 tablet (25 mg total) by mouth every 8 (eight) hours as needed for nausea or vomiting.     VITAMIN B-12 PO  Take 1 tablet by mouth daily.        Discharge Instructions: Please refer to Patient Instructions section of EMR for full details.  Patient was counseled important signs and symptoms that should prompt return to medical care, changes in medications, dietary instructions, activity restrictions, and follow up appointments.   Follow-Up Appointments: Follow-up Information   Follow up with Riviera Beach, MD On 09/09/2013. (at 12:30pm with Dr. Hardin Negus)    Specialty:  Anesthesiology   Contact information:   [address] [place] [address] [phone]       Follow up with Phill Myron, MD. Schedule an appointment as soon as possible for a visit in 1 week. [facility] follow-up appointment)    Specialty:  Family Medicine   Contact information:   [place] [address] [phone]       Nobie Putnam, DO 09/03/2013, 9:16 AM PGY-1, [place]

## 2013-09-02 NOTE — Progress Notes (Addendum)
Family Medicine Teaching Service Attending Note  I interviewed and examined patient [name] and reviewed their tests and x-rays.  I discussed with Dr. Raliegh Ip and reviewed their note for today.  I agree with their assessment and plan.     Additionally  Patient refusing to speak with me today "ask the other doctor if you want to know" "Tell me the definition of a liar" "You are doing the opposite of what you said and are making me worse"  Tried to discuss that it is difficult to treat him without being able to communicate - he still refused to answer questions or describe his symptoms  [name] [phone] who he authorized me to speak to 2 days ago and today.   Left message for her to ask nurse to page me  Will continue to advance diet as tolerated and wean analgesics accordingly. No signs of complications of pancreatitis Patient's psychiatric illnesses and chronic narcotic use are complicating treatment

## 2013-09-02 NOTE — Progress Notes (Signed)
Family Medicine Teaching Service Daily Progress Note Intern Pager: [phone]  Patient name: [name] Medical record number: [id number] Date of birth: Oct 05, 1955 Age: 58 y.o. Gender: male  Primary Care Provider: Phill Myron, MD Consultants: None Code Status: Full  Pt Overview and Major Events to Date:  5/22 - Admit, acute pancreatitis on CT, lipase 1317, WBC 22.8 5/23 - Diet - Clear liquid, WBC 24.1 5/24 - Lipase 140, WBC 20.8 5/25 - Inc Dilaudid (previously not covered well due to narcotic use hx), WBC 18.5 5/26 - Dec Dilaudid, WBC 9.9, Hgb 11.8-->10.2 5/27 - inc Hgb 10.2-->11.5, Anemia w/u with chronic disease, neg hemolysis         - pain not improved, remain NPO, inc IVF 5/28 - Start clear liquid diet, titrate down Dilaudid  Assessment and Plan: [name] is a 58 y.o. male presenting with acute pancreatitis . PMH is significant for Hepatitis C, alcohol abuse, Bipolar Disorder, HTN.  #Acute Pancreatitis, with suspected associated acute leukocytosis - Unchanged  -  Unclear exact etiology, suspicion of EtOH, however pt denies any recent alcohol use (remote history several yrs ago), (possible Lamictal trigger?), no CBD dilation or gallbladder inflam on CT scan. Lipase improved 1317 >>> 140 - Stable with mostly unchanged abdominal and back pain, vitals stable - Start Clear liquid diet, previously NPO, +flatus, no BM - Pain Control:       - Consider decreasing Dilaudid 1mg  to 0.5mg  IV q 2 hr (previous total 10mg  in 24 hours), complicated by hx narcotic abuse       - DC'd Percocet, plan to control pain IV, improve diet, then switch to PO meds       - Bowel regimen - S[place]ot BID (previously not receiving d/t NPO), patient refused Miralax multiple times - Inc IVF NS to 125 cc/hr (remains NPO >24 hrs) - WBC stable - Daily CBC and CMP, stable LFTs - Zofran PRN for nausea/vomiting - PT eval - rec SNF, likely for short term rehab, patient agreeable at this time. Additionally patient  considering refusing SNF and considering HHPT. Remains undecided.  # Acute on chronic anemia, consistent with anemia of chronic disease - Improved - Baseline Hgb 13-15, on admission Hgb 13.9, remained relatively stable until 5/24 with acute drop from 13.0 to 11.3. No evidence of acute bleed, noted no BM yet. No significant change in symptoms, without fatigue / dizziness, stable vitals, not tachycardic. Differential includes possible acute blood loss anemia, less likely hemolytic anemia, and likely contribution from dilutional component. - Improved Hgb 10.2 --> 11.5-->11.6 - Daily CBC, trend Hgb - FOBT (pending BM) - Iron Studies: Elevated ferritin (414), low iron (20), dec sat ratio 9 - Hemolysis labs (negative), mild elevated LDH 281, high haptoglobin 275, nml Retic. Not consistent with hemolysis - Folate >20, Vitamin B12 1271 (elevated) - Not consistent with deficiencies  # Transient hypoxemia, likely secondary to night-time OSA - Stable - Pt initially placed on O2 for comfort during episode of chest discomfort / anxiety on 5/23. Notable new findings with attempted titrate down on O2 o/n to 89% on RA (@ 2100), however patient does have hx OSA. Chart review with documented pulmonology follow-up without significant objective findings for CLD on testing, however determined to have difficult airway disease - Currently tolerating RA without supplemental O2, remained >95% - 5/27 - CXR consistent with small new left pleural effusion, otherwise no acute air space disease - Start incentive spiro q 4 hr  # OSA - Ordered CPAP, pt refused multiple  nights (previously with confusion and stating that he did not receive) - Clear per documentation that patient continues to refuse  #Hx of Polysubstance Abuse/ Chronic Opioid Dependence, complicating management of pancreatitis  - Pt with significant substance abuse history  - UDS (positive, BDZ only) - Pain medication as above  #Hx of Hepatitis C  - LFT's  WNL - Will continue to monitor; will need outpatient followup regarding this.  #Hx of Bipolar Disorder/anxiety disorder  - Continue home Lamictal 200mg  PO daily - Ativan IV PRN for anxiety  #Hx of Alcohol Abuse  - DC'd CIWA  # Hypokalemia  - K 3.3-->3.4 - Re-ordered potassium 63mEq PO  # GERD - Protonix 40mg  PO daily  FEN/GI: NPO with meds / sips, NS inc to 125 cc/hr  Prophylaxis: SQ heparin  Disposition: Admitted for acute pancreatitis; Home pending clinical improvement. Continued difficult pain management given prior narcotic use history, requiring higher doses to achieve response. Continue to manage pain / advance diet, goal to transition to PO pain meds and tolerating PO prior to discharge.  Subjective: Today patient very displeased and frustrated with many things about his care. Concern about doses of Dilaudid IV, he does not want to take high doses as he is concerned about withdrawal at home. He is distrustful of some members of the team, but ultimately says "I just want to feel better and know what's going on". Ready to start clear liquid diet today. Admits to +flatus, no BM, mostly unchanged abd pain, no worse.  Objective: Temp:  [97.5 F (36.4 C)-98.5 F (36.9 C)] 97.5 F (36.4 C) (05/28 0453) Pulse Rate:  [79-94] 79 (05/28 0453) Resp:  [18] 18 (05/28 0453) BP: (138-160)/(75-94) 160/83 mmHg (05/28 0453) SpO2:  [94 %-96 %] 96 % (05/28 0453)  Physical Exam: General: laying on Right side, no longer on O2, cooperative but displeased, NAD Cardiovascular: RRR, no murmurs appreciated  Respiratory: CTAB, nml effort, stable mild diminished breath sounds b/l bases Abdomen: Soft, stable mild distended abd, tenderness >epigastric, but generalized, no rebound tenderness, +hypoactive BS in all quads. +multiple ecchymosis from SQ heparin Extremities: No edema, non tender Neuro: awake, alert, oriented, grossly non-focal, intact muscle str 5/5  Laboratory:  Recent Labs Lab  08/30/13 0512 08/31/13 0638 09/01/13 0735  WBC 18.5* 9.9 10.4  HGB 11.8* 10.2* 11.5*  HCT 35.4* 31.7* 35.0*  PLT 186 182 225    Recent Labs Lab 08/31/13 0638 09/01/13 0735 09/02/13 0608  NA 139 141 140  K 3.3* 3.4* 4.1  CL 99 100 101  CO2 30 29 24   BUN 6 6 8   CREATININE 0.56 0.58 0.54  CALCIUM 8.5 8.6 8.9  PROT 6.1 6.4 6.7  BILITOT 0.4 0.4 0.4  ALKPHOS 74 72 73  ALT 23 30 30   AST 20 26 24   GLUCOSE 104* 88 84   Lipase - 1317 (5/22) -->140  5/22 UA - Unremarkable 5/22 UDS - only BDZ (positive)  Troponin-I - neg Trop-POCT - neg TG - 96  - Iron Studies: Elevated ferritin (414), low iron (20), dec sat ratio 9 - Hemolysis labs (negative), mild elevated LDH 281, high haptoglobin 275 - Folate >20, Vitamin B12 1271 (elevated)  Imaging/Diagnostic Tests:  5/22 CT Abd / Pelvis IMPRESSION:  1. Findings suggestive of acute pancreatitis. Correlation with serum  lipase recommended. No loculated fluid collection or evidence of  pancreatic necrosis.  2. No other acute intra-abdominal or pelvic abnormality.  3. Hepatic steatosis.  4. 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  5/22 CXR 2v IMPRESSION:  No active cardiopulmonary disease.  5/22 EKG NSR, HR 63, no acute ST-T wave changes.   5/27 Chest Xray 2v IMPRESSION:  1. New, small left pleural effusion.  2. Shallow lung inflation without evidence of acute airspace  disease.  Nobie Putnam, DO 09/02/2013, 8:24 AM PGY-1, [place] Intern pager: [phone], text pages welcome

## 2013-09-02 NOTE — Clinical Social Work Placement (Signed)
Clinical Social Work Department CLINICAL SOCIAL WORK PLACEMENT NOTE 09/02/2013  Patient:  [name], [name]  Account Number:  [id number] Admit date:  08/27/2013  Clinical Social Worker:  Kemper Durie, [place]  Date/time:  08/31/2013 07:51 PM  Clinical Social Work is seeking post-discharge placement for this patient at the following level of care:   SKILLED NURSING   (*CSW will update this form in Epic as items are completed)   08/31/2013  Patient/family provided with [place] Department of Clinical Social Work's list of facilities offering this level of care within the geographic area requested by the patient (or if unable, by the patient's family).  08/31/2013  Patient/family informed of their freedom to choose among providers that offer the needed level of care, that participate in Medicare, Medicaid or managed care program needed by the patient, have an available bed and are willing to accept the patient.  08/31/2013  Patient/family informed of [facility]' ownership interest in [facility], as well as of the fact that they are under no obligation to receive care at this facility.  PASARR submitted to EDS on 08/31/2013 PASARR number received from EDS on 08/31/2013  FL2 transmitted to all facilities in geographic area requested by pt/family on  08/31/2013 FL2 transmitted to all facilities within larger geographic area on   Patient informed that his/her managed care company has contracts with or will negotiate with  certain facilities, including the following:     Patient/family informed of bed offers received:  09/01/2013 Patient chooses bed at  Physician recommends and patient chooses bed at    Patient to be transferred to  on   Patient to be transferred to facility by   The following physician request were entered in Epic:   Additional Comments: Patient was given bed offers at 2:30pm on 09/01/13. CSW instructed patient to be prepared to give CSW SNF decision  by 09/02/13.   Update: Patient states that he plans to go home with home health. He specifically says, "I'm not going to a facility." CSW has informed RNCM, and informed patient that if he decides to go to SNF he will need to make a decision from options that have been given to him.    Liz Beach MSW, [place], [place], [phone]

## 2013-09-02 NOTE — Care Management Note (Addendum)
    Page 1 of 2   09/03/2013     2:20:29 PM CARE MANAGEMENT NOTE 09/03/2013  Patient:  [name], [name]   Account Number:  [id number]  Date Initiated:  09/02/2013  Documentation initiated by:  Tomi Bamberger  Subjective/Objective Assessment:   dx acute pancreatitis  admit- lives alone.     Action/Plan:   pt eval- rec snf, pt does not want to go to snf he wants to go home with hhpt.   Anticipated DC Date:  09/03/2013   Anticipated DC Plan:  SKILLED NURSING FACILITY  In-house referral  Clinical Social Worker      DC Forensic scientist  CM consult      [facility] Choice  HOME HEALTH   Choice offered to / List presented to:  C-1 Patient      DME agency  [place] arranged  [place].   Status of service:  Completed, signed off Medicare Important Message given?  YES (If response is "NO", the following Medicare IM given date fields will be blank) Date Medicare IM given:  08/27/2013 Date Additional Medicare IM given:  09/03/2013  Discharge Disposition:  [place]  Per UR Regulation:  Reviewed for med. necessity/level of care/duration of stay  If discussed at [place] of Stay Meetings, dates discussed:    Comments:  09/03/13 [place], BSN [phone] patient is for dc today.  09/02/13 [place], BSN [phone] patient lives alone, pt recs snf, patient does not want to go home with snf, per CSW he wants hh, NCM spoke with patient he chose [facility], referral made to [facility] for hhpt and rolling walker.  Soc will begin 24-48 hrs post dc.

## 2013-09-02 NOTE — Progress Notes (Signed)
Returned to see patient wanting to leave Explained would be dangerous until we prove he can take orals reliably He agrees to stay  Much more pleasant and conversant than earlier today  If can tolerate liquids and oral pain medications can discharge tomorrow

## 2013-09-03 DIAGNOSIS — K859 Acute pancreatitis without necrosis or infection, unspecified: Secondary | ICD-10-CM | POA: Diagnosis not present

## 2013-09-03 LAB — COMPREHENSIVE METABOLIC PANEL
ALT: 25 U/L (ref 0–53)
AST: 19 U/L (ref 0–37)
Albumin: 3 g/dL — ABNORMAL LOW (ref 3.5–5.2)
Alkaline Phosphatase: 72 U/L (ref 39–117)
BUN: 6 mg/dL (ref 6–23)
CO2: 26 mEq/L (ref 19–32)
Calcium: 8.8 mg/dL (ref 8.4–10.5)
Chloride: 102 mEq/L (ref 96–112)
Creatinine, Ser: 0.61 mg/dL (ref 0.50–1.35)
GFR calc Af Amer: >90 mL/min (ref >90)
GFR calc non Af Amer: >90 mL/min (ref >90)
Glucose, Bld: 107 mg/dL — ABNORMAL HIGH (ref 70–99)
Potassium: 3.3 mEq/L — ABNORMAL LOW (ref 3.7–5.3)
Sodium: 141 mEq/L (ref 137–147)
Total Bilirubin: 0.4 mg/dL (ref 0.3–1.2)
Total Protein: 6.7 g/dL (ref 6.0–8.3)

## 2013-09-03 LAB — CBC WITH DIFFERENTIAL/PLATELET
Basophils Absolute: 0 10*3/uL (ref 0.0–0.1)
Basophils Relative: 0 % (ref 0–1)
Eosinophils Absolute: 0.3 10*3/uL (ref 0.0–0.7)
Eosinophils Relative: 3 % (ref 0–5)
HCT: 35.1 % — ABNORMAL LOW (ref 39.0–52.0)
Hemoglobin: 11.4 g/dL — ABNORMAL LOW (ref 13.0–17.0)
Lymphocytes Relative: 19 % (ref 12–46)
Lymphs Abs: 2 10*3/uL (ref 0.7–4.0)
MCH: 25.7 pg — ABNORMAL LOW (ref 26.0–34.0)
MCHC: 32.5 g/dL (ref 30.0–36.0)
MCV: 79.2 fL (ref 78.0–100.0)
Monocytes Absolute: 1.2 10*3/uL — ABNORMAL HIGH (ref 0.1–1.0)
Monocytes Relative: 11 % (ref 3–12)
Neutro Abs: 7 10*3/uL (ref 1.7–7.7)
Neutrophils Relative %: 67 % (ref 43–77)
Platelets: 287 10*3/uL (ref 150–400)
RBC: 4.43 MIL/uL (ref 4.22–5.81)
RDW: 14.7 % (ref 11.5–15.5)
WBC: 10.5 10*3/uL (ref 4.0–10.5)

## 2013-09-03 LAB — GLUCOSE, CAPILLARY: Glucose-Capillary: 104 mg/dL — ABNORMAL HIGH (ref 70–99)

## 2013-09-03 MED ORDER — OXYCODONE HCL 5 MG PO TABS: 5.0000 mg | ORAL_TABLET | ORAL | Status: DC | PRN

## 2013-09-03 NOTE — Discharge Summary (Signed)
Family Medicine Teaching Service Attending Note  I interviewed and examined patient [name] and reviewed their tests and x-rays.  I discussed with Dr. Raliegh Ip and reviewed their note for today.  I agree with their assessment and plan.     Additionally  Feeling some better Tolerated liquids overnight with vomiting or increased abdomen pain Desires to go home where he feels he can better take care of his back pain Will discharge to close follow up with his pain physician and PCP

## 2013-09-03 NOTE — Progress Notes (Signed)
Chaplain responded to consult and referral for advance directive assistance. Pt being discharged but wanted paperwork and explanation of HCPOA and Living Will. Chaplain provided paperwork and reviewed both HCPOA and Living Will with patient. Pt said he has no spouse, children, or siblings, but wants a close friend to be his health care agent. Pt appreciated material and said he would complete and have notarized outside hospital with several other documents.  [name] [phone]

## 2013-09-03 NOTE — Progress Notes (Signed)
Patient discharge teaching given, including activity, diet, follow-up appoints, and medications. Patient verbalized understanding of all discharge instructions. IV access was d/c'd. Vitals are stable. Skin is intact except as charted in most recent assessments. Pt to be escorted out by NT, to be driven home by family. 

## 2013-09-03 NOTE — Discharge Instructions (Signed)
You were hospitalized with a diagnosis of Acute Pancreatitis, confirmed by elevated lipase enzyme with your blood work and evidence on CT Scan of your Abdomen with Acute Pancreatitis, most commonly caused by gallstones or alcohol use, sometimes certain medications can cause, however we could not identify an exact cause for your flare. Important to monitor your diet to determine if there is any link or association with any foods.  For your pain control, we have discussed our plan with Dr. Hardin Negus, and have prescribed you Oxycodone Immediate Release tablets, take 5 to 10 mg total every 4 hours as needed for pain, #60 tablets, 0 refills. Please take as instructed and work on reducing dose over next week. We have already arranged close follow-up on Thursday 09/09/13 at 12:30pm.  Scheduled for a [facility] hospital follow-up with Dr. Berkley Harvey on 09/09/13 at 3:30pm.  If you develop significant worsening of pain not improved by medications, along with persistent nausea and vomiting, chest pain or difficulty breathing, please call the clinic or return to Emergency Department for further evaluation.   Acute Pancreatitis Acute pancreatitis is a disease in which the pancreas becomes suddenly inflamed. The pancreas is a large gland located behind your stomach. The pancreas produces enzymes that help digest food. The pancreas also releases the hormones glucagon and insulin that help regulate blood sugar. Damage to the pancreas occurs when the digestive enzymes from the pancreas are activated and begin attacking the pancreas before being released into the intestine. Most acute attacks last a couple of days and can cause serious complications. Some people become dehydrated and develop low blood pressure. In severe cases, bleeding into the pancreas can lead to shock and can be life-threatening. The lungs, heart, and kidneys may fail. CAUSES  Pancreatitis can happen to anyone. In some cases, the cause is unknown.  Most cases are caused by:  Alcohol abuse.  Gallstones. Other less common causes are:  Certain medicines.  Exposure to certain chemicals.  Infection.  Damage caused by an accident (trauma).  Abdominal surgery. SYMPTOMS   Pain in the upper abdomen that may radiate to the back.  Tenderness and swelling of the abdomen.  Nausea and vomiting. DIAGNOSIS  Your caregiver will perform a physical exam. Blood and stool tests may be done to confirm the diagnosis. Imaging tests may also be done, such as X-rays, CT scans, or an ultrasound of the abdomen. TREATMENT  Treatment usually requires a stay in the hospital. Treatment may include:  Pain medicine.  Fluid replacement through an intravenous line (IV).  Placing a tube in the stomach to remove stomach contents and control vomiting.  Not eating for 3 or 4 days. This gives your pancreas a rest, because enzymes are not being produced that can cause further damage.  Antibiotic medicines if your condition is caused by an infection.  Surgery of the pancreas or gallbladder. HOME CARE INSTRUCTIONS   Follow the diet advised by your caregiver. This may involve avoiding alcohol and decreasing the amount of fat in your diet.  Eat smaller, more frequent meals. This reduces the amount of digestive juices the pancreas produces.  Drink enough fluids to keep your urine clear or pale yellow.  Only take over-the-counter or prescription medicines as directed by your caregiver.  Avoid drinking alcohol if it caused your condition.  Do not smoke.  Get plenty of rest.  Check your blood sugar at home as directed by your caregiver.  Keep all follow-up appointments as directed by your caregiver. [place]  CARE IF:   You do not recover as quickly as expected.  You develop new or worsening symptoms.  You have persistent pain, weakness, or nausea.  You recover and then have another episode of pain. SEEK IMMEDIATE MEDICAL CARE IF:   You  are unable to eat or keep fluids down.  Your pain becomes severe.  You have a fever or persistent symptoms for more than 2 to 3 days.  You have a fever and your symptoms suddenly get worse.  Your skin or the white part of your eyes turn yellow (jaundice).  You develop vomiting.  You feel dizzy, or you faint.  Your blood sugar is high (over 300 mg/dL). MAKE SURE YOU:   Understand these instructions.  Will watch your condition.  Will get help right away if you are not doing well or get worse. Document Released: 03/25/2005 Document Revised: 09/24/2011 Document Reviewed: 07/04/2011 [facility] Patient Information 2014 [place].

## 2013-09-08 ENCOUNTER — Telehealth: Payer: Self-pay | Admitting: Family Medicine

## 2013-09-08 NOTE — Telephone Encounter (Signed)
[facility] called to inform the doctor that the pt does not want PT. He said that he is getting around fine. [facility] said that if he changes his mind to let them know. jw

## 2013-09-09 ENCOUNTER — Ambulatory Visit (INDEPENDENT_AMBULATORY_CARE_PROVIDER_SITE_OTHER): Payer: Medicare Other | Admitting: Family Medicine

## 2013-09-09 ENCOUNTER — Encounter: Payer: Self-pay | Admitting: Family Medicine

## 2013-09-09 VITALS — BP 128/89 | HR 97 | Temp 98.0°F | Wt 161.0 lb

## 2013-09-09 DIAGNOSIS — G4733 Obstructive sleep apnea (adult) (pediatric): Secondary | ICD-10-CM

## 2013-09-09 DIAGNOSIS — B1710 Acute hepatitis C without hepatic coma: Secondary | ICD-10-CM

## 2013-09-09 DIAGNOSIS — K859 Acute pancreatitis without necrosis or infection, unspecified: Secondary | ICD-10-CM

## 2013-09-09 NOTE — Patient Instructions (Signed)
It was great seeing you today.   1. I have order some blood work to check your Hepatitis C status 2. I have order a sleep study to evaluate your sleep apnea and get a new machine for you if necessary   Please bring all your medications to every doctors visit  Sign up for My Chart to have easy access to your labs results, and communication with your Primary care physician.  Next Appointment  Please call to make an appointment with Dr Berkley Harvey in 1 month   I look forward to talking with you again at our next visit. If you have any questions or concerns before then, please call the clinic at [phone].  Take Care,   Dr Phill Myron

## 2013-09-09 NOTE — Progress Notes (Signed)
Subjective:     Patient ID: [name], male   DOB: 28-Dec-1955, 58 y.o.   MRN: [id number]  HPI Comments: He continues to endorse mid-back pain which he associates with his pancreatitis; " I think i've had this for a while and has been causing my back pain." He is eating well without increased pain, but he is afraid to advance his diet to fast- mostly eating veggies for now. He has seen his pain specialist today, and denies any other complaints. He reports that his Hep C - "Cured itself" and is interested in repeating his sleep study and getting back on his CPAP machine due to being told he "stopped breathing several times while in the hospital."    Review of Systems  Constitutional: Negative for fever, appetite change and fatigue.  Cardiovascular: Negative for chest pain.  Gastrointestinal: Negative for nausea, vomiting and abdominal pain.  Musculoskeletal: Positive for back pain.       Objective:   Physical Exam  Cardiovascular: Normal rate and regular rhythm.   Pulmonary/Chest: Effort normal and breath sounds normal.  Abdominal: Soft. Bowel sounds are normal. There is no tenderness. There is no guarding.  Musculoskeletal:  Back: Paraspinal tenderness and Rt sided muscle spasms    Assessment/Plan:      See Problem Focused Assessment & Plan

## 2013-09-10 ENCOUNTER — Encounter: Payer: Self-pay | Admitting: Family Medicine

## 2013-09-10 LAB — HEPATITIS C ANTIBODY: HCV Ab: REACTIVE — AB

## 2013-09-10 LAB — HEPATITIS C RNA QUANTITATIVE: HCV Quantitative: NOT DETECTED IU/mL (ref <15)

## 2013-09-10 NOTE — Assessment & Plan Note (Signed)
[name] reports Hep C "healed itself" years ago - No Laboratory work-up in EPIC - Check Hep C antibody and Hep C RNA

## 2013-09-10 NOTE — Assessment & Plan Note (Signed)
Pancreatitis resolved - Denies abdominal pain, Nausea or vomiting - Still slowly advancing diet: mostly eating veggies due to fear of pancreatitis coming back - Continue to denies alcohol use of symptoms of gallbladder dysfcn prior to recent pancreatitis

## 2013-09-10 NOTE — Assessment & Plan Note (Signed)
Hx of Sleep Apnea and reports he didn't tolerate CPAP machine in the past but is willing to try again - Episodes of transient apnea during last hospitalization while on high doses of pain medication for pancreatitis - Ordered Sleep study

## 2013-09-23 ENCOUNTER — Encounter: Payer: Self-pay | Admitting: Family Medicine

## 2013-09-28 ENCOUNTER — Ambulatory Visit (HOSPITAL_COMMUNITY)
Admission: RE | Admit: 2013-09-28 | Discharge: 2013-09-28 | Disposition: A | Discharge status: Home / Self Care | Payer: Medicare Other | Source: Ambulatory Visit | Attending: Family Medicine | Admitting: Family Medicine

## 2013-09-28 ENCOUNTER — Encounter: Payer: Self-pay | Admitting: Family Medicine

## 2013-09-28 ENCOUNTER — Ambulatory Visit (INDEPENDENT_AMBULATORY_CARE_PROVIDER_SITE_OTHER): Payer: Medicare Other | Admitting: Family Medicine

## 2013-09-28 VITALS — BP 161/107 | HR 93 | Temp 98.7°F | Wt 154.0 lb

## 2013-09-28 DIAGNOSIS — R079 Chest pain, unspecified: Secondary | ICD-10-CM | POA: Insufficient documentation

## 2013-09-28 DIAGNOSIS — R42 Dizziness and giddiness: Secondary | ICD-10-CM

## 2013-09-28 LAB — CBC WITH DIFFERENTIAL/PLATELET
Basophils Absolute: 0 10*3/uL (ref 0.0–0.1)
Basophils Relative: 0 % (ref 0–1)
Eosinophils Absolute: 0.1 10*3/uL (ref 0.0–0.7)
Eosinophils Relative: 1 % (ref 0–5)
HCT: 40.3 % (ref 39.0–52.0)
Hemoglobin: 13.6 g/dL (ref 13.0–17.0)
Lymphocytes Relative: 32 % (ref 12–46)
Lymphs Abs: 2 10*3/uL (ref 0.7–4.0)
MCH: 26.7 pg (ref 26.0–34.0)
MCHC: 33.7 g/dL (ref 30.0–36.0)
MCV: 79 fL (ref 78.0–100.0)
Monocytes Absolute: 0.5 10*3/uL (ref 0.1–1.0)
Monocytes Relative: 8 % (ref 3–12)
Neutro Abs: 3.8 10*3/uL (ref 1.7–7.7)
Neutrophils Relative %: 59 % (ref 43–77)
Platelets: 231 10*3/uL (ref 150–400)
RBC: 5.1 MIL/uL (ref 4.22–5.81)
RDW: 14.4 % (ref 11.5–15.5)
WBC: 6.4 10*3/uL (ref 4.0–10.5)

## 2013-09-28 LAB — BASIC METABOLIC PANEL
BUN: 6 mg/dL (ref 6–23)
CO2: 25 mEq/L (ref 19–32)
Calcium: 9.8 mg/dL (ref 8.4–10.5)
Chloride: 109 mEq/L (ref 96–112)
Creat: 0.83 mg/dL (ref 0.50–1.35)
Glucose, Bld: 108 mg/dL — ABNORMAL HIGH (ref 70–99)
Potassium: 4.3 mEq/L (ref 3.5–5.3)
Sodium: 147 mEq/L — ABNORMAL HIGH (ref 135–145)

## 2013-09-28 LAB — TROPONIN I: Troponin I: <0.3 ng/mL (ref <0.06)

## 2013-09-28 LAB — GLUCOSE, CAPILLARY: Glucose-Capillary: 92 mg/dL (ref 70–99)

## 2013-09-28 NOTE — Patient Instructions (Signed)
It was great to meet you today!  In summary Stop grapeseed extract and tumeric, unlikely but possibly contributing We will check labs, I'll contact you if they are significantly abnormal Stay hydrated, this ultimately sounds like you dont have enough fluid  Please see you PCP in 1 week.

## 2013-09-28 NOTE — Progress Notes (Addendum)
Patient ID: [name], male   DOB: 06/11/1955, 58 y.o.   MRN: [id number]  Kenn File, MD Phone: [phone]  Subjective:  Chief complaint-noted  Pt Here for dizziness and headache  He states that his dizziness and headache started about 2 days ago. He was lying down, "jumped up"  to get a history of twin he felt very dizzy and had a near syncopal episode. He states that he may do to distract Dilantin begin driving when he felt like he may pass out see stop when back home.  At home he laid down and took a nap and felt better when he woke up. After he got up and similarly on his symptoms returned and he had to lay back down. He laid down all day that day. The next day he woke up feeling well and had a return of similar symptoms which resolved after a nap.  He states that his symptoms were dizziness, without vertigo( no room spinning), headache ( described as punding diffuse headache when he sat up), and burning chest pain extending from his throat to his lower abdomen similar to GERD pain he's had before.  The chest/abdomen pain did not return the second day and has not returned today.  Today he feels the headache and only intermittent mild dizziness but states that this is because he's taking it easy.   He denies any alcohol use. He states that on the first day he had just taken one oxycodone, on the second day he had taken a half of an oxycodone, and today he has not taken any.  ROS-  No fever, chills, sweats No diarrhea, no heat exposure and no decreased by mouth fluid intake States that he's drinking about 10 bottles of water daily.  Past Medical History Patient Active Problem List   Diagnosis Date Noted  . Dizziness 09/28/2013  . Acute pancreatitis 08/27/2013  . Lightheadedness 11/01/2012  . Unstable angina 09/15/2012  . Exertional chest pain 09/07/2012  . Dyspnea on exertion 09/07/2012  . Constipation 04/24/2011  . GERD (gastroesophageal reflux disease) 12/18/2010  .  OBSTRUCTIVE SLEEP APNEA 09/20/2009  . HYPERTENSION, BENIGN 10/19/2008  . VERTIGO 04/05/2008  . RHINITIS 05/13/2007  . HEPATITIS C 06/05/2006  . DEPRESSION, MAJOR, RECURRENT 06/05/2006  . BIPOLAR DISORDER 06/05/2006  . ANXIETY 06/05/2006  . Alcohol abuse, unspecified 06/05/2006  . BACK PAIN, LOW 06/05/2006    Medications- reviewed and updated Current Outpatient Prescriptions  Medication Sig Dispense Refill  . ALPRAZolam (XANAX) 0.5 MG tablet Take 0.167 mg by mouth 3 (three) times daily.      Marland Kitchen aspirin EC 81 MG tablet Take 81 mg by mouth daily.       Marland Kitchen atenolol (TENORMIN) 50 MG tablet Take 50 mg by mouth daily as needed (if systolic blood pressure over 135).       . Cyanocobalamin (VITAMIN B-12 PO) Take 1 tablet by mouth daily.      . Fiber POWD Take 1 packet by mouth daily as needed (constipation). Mix in 8 oz liquid and drink      . fluticasone (FLONASE) 50 MCG/ACT nasal spray Place 2 sprays into both nostrils 3 (three) times daily as needed for allergies.       Marland Kitchen gabapentin (NEURONTIN) 300 MG capsule Take 300-600 mg by mouth 3 (three) times daily. Take 1 capsule (300 mg) twice daily, and take 2 capsules (600 mg) at bedtime      . ibuprofen (ADVIL,MOTRIN) 200 MG tablet Take 400-800 mg by mouth  3 (three) times daily as needed (pain).      Marland Kitchen lamoTRIgine (LAMICTAL) 200 MG tablet Take 200 mg by mouth at bedtime.       . lidocaine (LIDODERM) 5 % Place 2 patches onto the skin daily.       . Melatonin 10 MG CAPS Take 10 mg by mouth at bedtime as needed (sleep).       . mirtazapine (REMERON) 45 MG tablet Take 45 mg by mouth at bedtime.      . Multiple Vitamin (MULTIVITAMIN WITH MINERALS) TABS Take 1 tablet by mouth daily.       Marland Kitchen omeprazole (PRILOSEC) 20 MG capsule Take 20 mg by mouth at bedtime.      Marland Kitchen oxyCODONE (OXY IR/ROXICODONE) 5 MG immediate release tablet Take 1-2 tablets (5-10 mg total) by mouth every 4 (four) hours as needed for severe pain.  60 tablet  0  . promethazine (PHENERGAN) 25  MG tablet Take 1 tablet (25 mg total) by mouth every 8 (eight) hours as needed for nausea or vomiting.  20 tablet  0   No current facility-administered medications for this visit.   Facility-Administered Medications Ordered in Other Visits  Medication Dose Route Frequency Provider Last Rate Last Dose  . 0.9 %  sodium chloride infusion   Intravenous Continuous Montez Morita, MD 20 mL/hr at 09/15/12 1635 500 mL at 09/15/12 1635    Objective: BP 161/107  Pulse 93  Temp(Src) 98.7 F (37.1 C) (Oral)  Wt 154 lb (69.854 kg)  SpO2 96% Gen: NAD, alert, cooperative with exam, lying down I entered the room, and lying down most of the exam HEENT: NCAT, EOMI CV: RRR, good S1/S2, no murmur Resp: CTABL, no wheezes, non-labored Abd: SNTND, BS present, no guarding or organomegaly Ext: No edema, warm Neuro: Alert and oriented, strength 5/5 in bilateral lower and bilateral upper extremities, sensation normal throughout, EOMI, cranial nerves II through XII intact, no pronator drift, normal gait, normal speech, symmetric smile.   Assessment/Plan:  Dizziness Dizziness with headache, from his history it sounds very suspicious for orthostatic hypotension Denies any recent reasons for dehydration, no orthostasis here in the clinic Very broad differential including orthostasis, BPPV, stroke, hypertensive urgency/emergency, anxiety Neuro exam normal, Dix-Hallpike normal, blood pressure elevated initially but improved with the orthostatic testing Check BMP and CBC Encouraged hydration, I feel this is most likely orthostasis Stop tumeric and grapeseed extract Followup one week with PCP  Chest pain Chest pain most likely due to GERD Atypical EKG today with no abnormal findings. Check CBC and BMP Followup one week with PCP CBG 92, Stat troponin added per addendum    Orders Placed This Encounter  Procedures  . Basic Metabolic Panel  . CBC with Differential  . EKG 12-Lead     **Addendum**:  The patient made to lab he continued to look pale, weak, and obviously more dizzy. I would reexamine the patient and he was much more dizzy than he was in the exam room. We walked him back to the exam room where he laid down for about 20 minutes. We checked a CBG which was 92. He also explained that he's been having chest numbness for about 3-4 days. I added a stat troponin to his other labs and he agrees to go to the ER this afternoon if it's positive.   After 20 minutes of rest and drinking some fluid he looked marginally better and could stand without looking dizzy, and he stated that he felt  better. I offered direct admission for observation for 24 hours and he politely declined. He contracts to come back if he gets worse, develops chest pain, worse weakness, or symptoms of stroke which we reviewed in detail.  Laroy Apple, MD [place] Resident, PGY-2 09/28/2013, 11:37 AM

## 2013-09-28 NOTE — Assessment & Plan Note (Addendum)
Chest pain most likely due to GERD Atypical EKG today with no abnormal findings. Check CBC and BMP Followup one week with PCP CBG 92, Stat troponin added per addendum

## 2013-09-28 NOTE — Assessment & Plan Note (Addendum)
Dizziness with headache, from his history it sounds very suspicious for orthostatic hypotension Denies any recent reasons for dehydration, no orthostasis here in the clinic Very broad differential including orthostasis, BPPV, stroke, hypertensive urgency/emergency, anxiety Neuro exam normal, Dix-Hallpike normal, blood pressure elevated initially but improved with the orthostatic testing Check BMP and CBC Encouraged hydration, I feel this is most likely orthostasis Stop tumeric and grapeseed extract Followup one week with PCP

## 2013-09-28 NOTE — Addendum Note (Signed)
Addended by: Timmothy Euler on: 09/28/2013 11:38 AM   Modules accepted: Orders

## 2013-09-29 ENCOUNTER — Telehealth: Payer: Self-pay | Admitting: Family Medicine

## 2013-09-29 NOTE — Telephone Encounter (Signed)
Called to check in and report normal labs. Left VM. Will ask staff to touch base and encourage SDA vs ED if he is not doing well.   Laroy Apple, MD [place] Resident, PGY-2 09/29/2013, 1:57 PM

## 2013-09-29 NOTE — Telephone Encounter (Signed)
Pt is appreciative of drs call back today Seems to be feeling better

## 2013-10-02 ENCOUNTER — Other Ambulatory Visit: Payer: Self-pay | Admitting: Family Medicine

## 2013-10-07 ENCOUNTER — Encounter: Payer: Self-pay | Admitting: Family Medicine

## 2013-10-07 ENCOUNTER — Ambulatory Visit (INDEPENDENT_AMBULATORY_CARE_PROVIDER_SITE_OTHER): Payer: Medicare Other | Admitting: Family Medicine

## 2013-10-07 VITALS — BP 120/82 | HR 81 | Temp 98.4°F | Resp 18 | Wt 154.0 lb

## 2013-10-07 DIAGNOSIS — R519 Headache, unspecified: Secondary | ICD-10-CM

## 2013-10-07 DIAGNOSIS — R51 Headache: Secondary | ICD-10-CM

## 2013-10-07 NOTE — Progress Notes (Signed)
Patient ID: [name], male   DOB: 14-Jan-1956, 58 y.o.   MRN: [id number]   Subjective:    Patient ID: [name], male    DOB: 13-May-1955, 58 y.o.   MRN: [id number]  HPI  CC: head burning  # "Head burning":  Has had this pain in the past, but never this intense and never just solely his head, usually has associated chest pains. Says prior episodes last up to 7 days before going away  Located around whole head, no specific area, doesn't feel like it is on his skin  Went to [place] 4-5 days ago, did blood work and "looked at heart"  Alleviating: Better when he lays down  Feels a lot better off tumeric, also weaning himself off other medications like oxycodone IR. ROS: No CP, no SOB, no changes in vision, no fevers/chills, no nausea/vomiting, no weakness  Review of Systems   See HPI for ROS. Objective:  BP 120/82  Pulse 81  Temp(Src) 98.4 F (36.9 C) (Oral)  Resp 18  Wt 154 lb (69.854 kg)  SpO2 96%  General: NAD, appears slightly agitated HEENT: [place]/AT. PERRL, EOMI, optic discs sharp bilaterally CV: RRR, normal s1/s2, no murmur. 2+ radial pulses bilaterally Resp: CTAB, normal effort Neuro: alert and oriented, CN2-12 normal, grip strenght 5/5 bilaterally. Gait normal. Skin: normal scalp appearance, no erythema or warmth, no lesions.    Assessment & Plan:  See Problem List Documentation

## 2013-10-10 DIAGNOSIS — R51 Headache: Secondary | ICD-10-CM

## 2013-10-10 DIAGNOSIS — R519 Headache, unspecified: Secondary | ICD-10-CM | POA: Insufficient documentation

## 2013-10-10 NOTE — Assessment & Plan Note (Signed)
Acute on chronic episode. No red flag symptoms and normal neuro exam. Discussed holding off on imaging at this time, patient was not interested in trying OTC pain medications (or even asking for prescriptions). He agreed on no additional intervention at this time and to see if it will resolve like prior episodes. Patient asked to leave office before AVS could be created and printed for him.

## 2013-10-12 ENCOUNTER — Ambulatory Visit: Payer: Medicare Other | Admitting: Family Medicine

## 2013-10-12 ENCOUNTER — Ambulatory Visit (HOSPITAL_BASED_OUTPATIENT_CLINIC_OR_DEPARTMENT_OTHER): Payer: Medicare Other | Attending: Family Medicine | Admitting: Radiology

## 2013-10-12 VITALS — Ht 67.0 in | Wt 165.0 lb

## 2013-10-12 DIAGNOSIS — G4710 Hypersomnia, unspecified: Secondary | ICD-10-CM | POA: Diagnosis present

## 2013-10-12 DIAGNOSIS — G4733 Obstructive sleep apnea (adult) (pediatric): Secondary | ICD-10-CM

## 2013-10-16 DIAGNOSIS — G4733 Obstructive sleep apnea (adult) (pediatric): Secondary | ICD-10-CM

## 2013-10-16 NOTE — Sleep Study (Signed)
   NAME: [name] DATE OF BIRTH:  December 14, 1955 MEDICAL RECORD NUMBER [id number]  LOCATION: [facility]  PHYSICIAN: Jacquetta Polhamus D  DATE OF STUDY: 10/12/2013  SLEEP STUDY TYPE: Nocturnal Polysomnogram               REFERRING PHYSICIAN: Phill Myron, MD  INDICATION FOR STUDY: Hypersomnia with sleep apnea  EPWORTH SLEEPINESS SCORE:   0/24 HEIGHT: 5\' 7"  (170.2 cm)  WEIGHT: 165 lb (74.844 kg)    Body mass index is 25.84 kg/(m^2).  NECK SIZE: 14 in.  MEDICATIONS: Charted for review  SLEEP ARCHITECTURE: Split study protocol. During the diagnostic phase total sleep time was 135.5 minutes with sleep efficiency 81.1%. Stage I was 18.1%, stage II 81.9%, stage III and REM were absent. Sleep latency 25 minutes, awake after sleep onset 0.5 minutes, arousal index 29.2, bedtime medication: Lamictal, Remeron, Neurontin, aspirin and to ibuprofen taken at 2023. 2 more Neurontin were taken at 2147 for back pain and oxycodone was taken at 2156 for continued back pain.  RESPIRATORY DATA: Apnea hypopneas index (AHI) 23.5 per hour. 53 total events scored including one obstructive apnea and 52 hypopneas. All events were recorded while sleeping supine. CPAP was titrated to 9 CWP, AHI 0.5 per hour. He wore a medium fullface mask.  OXYGEN DATA: Moderate snoring before CPAP with oxygen desaturation to a nadir of 79% on room air. With CPAP control, snoring was prevented and mean oxygen saturation held 92.2% on room air.  CARDIAC DATA: Normal sinus rhythm  MOVEMENT/PARASOMNIA: During the diagnostic phase, a total of 108 limb jerks were counted of which 14 were associated with arousal or awakenings for a periodic limb movement with arousal index of 6.2 per hour. No events were recorded during the titration phase, suggesting the limb jerks have been related to respiratory sleep disturbance. Bathroom x1  IMPRESSION/ RECOMMENDATION:   1) Sleep architecture was not remarkable for the sleep center  environment, recognizing the amount of bedtime medication taken as noted above. 2) Moderate obstructive sleep apnea/hypopneas syndrome, AHI 23.5 per hour with supine events. Moderate snoring with oxygen desaturation to a nadir of 79% on room air. 3) Successful CPAP titration to 9 CWP, AHI 0.5 per hour. He wore a medium [employer] Simplus fullface mask with heated humidifier. Snoring was prevented and mean oxygen saturation held at 92.2% on room air with CPAP.   Signed Baird Lyons M.D. Deneise Lever Diplomate, American Board of Sleep Medicine  ELECTRONICALLY SIGNED ON:  10/16/2013, 10:20 AM [place] PH: [phone]   FX: [phone] [place]

## 2013-10-18 ENCOUNTER — Encounter: Payer: Self-pay | Admitting: Family Medicine

## 2013-11-08 ENCOUNTER — Ambulatory Visit: Payer: Medicare Other | Admitting: Family Medicine

## 2013-11-09 ENCOUNTER — Encounter: Payer: Self-pay | Admitting: Family Medicine

## 2013-11-09 ENCOUNTER — Ambulatory Visit (INDEPENDENT_AMBULATORY_CARE_PROVIDER_SITE_OTHER): Payer: Medicare Other | Admitting: Family Medicine

## 2013-11-09 VITALS — BP 114/75 | HR 85 | Temp 98.4°F | Wt 156.0 lb

## 2013-11-09 DIAGNOSIS — J309 Allergic rhinitis, unspecified: Secondary | ICD-10-CM

## 2013-11-09 DIAGNOSIS — G4733 Obstructive sleep apnea (adult) (pediatric): Secondary | ICD-10-CM

## 2013-11-09 NOTE — Progress Notes (Signed)
   Subjective:    Patient ID: [name], male    DOB: Aug 07, 1955, 58 y.o.   MRN: [id number]  HPI Comments: Mr [name] comes in today for f/u of his Sleep Apnea. He completed his sleep study as was instructed to follow up with his PCP to obtain his machine. He continued to endorse snoring and waking up occasionally throughout the night. He also reports having an CPAP machine in the past, but says this was "taken away from him" several years ago because it was being monitored and he keep pulling it off in the middle of the night. He associates this with being on a lot of pain medication which he now taking less. Additionally today he complains of nasal congestion. He takes Flonase and Benadryl for this.  Denies any fevers, chills, coughing, sore throat, or difficulty breathing.     Review of Systems  Constitutional: Positive for fatigue. Negative for fever.  HENT: Negative for congestion, postnasal drip and sinus pressure.        Objective:   Physical Exam  Constitutional: He appears well-developed and well-nourished.  Cardiovascular: Normal rate and regular rhythm.   Pulmonary/Chest: Effort normal and breath sounds normal.    Assessment/Plan:      See Problem Focused Assessment & Plan

## 2013-11-09 NOTE — Patient Instructions (Signed)
It was great seeing you today.   1. I will notify Home health of your need for a CPAP machine and they'll come to your house to step this up. Please call me if you have not heard from them in 2 weeks.  2. Antihistamines: Zyrtec, Claritin, Allegra 3. Take Flonase twice a day for 1-2 weeks; then decrease to once daily when your symptoms improve    Please bring all your medications to every doctors visit  Sign up for My Chart to have easy access to your labs results, and communication with your Primary care physician.  Next Appointment  Please call to make an appointment with Dr Berkley Harvey in 3 months or soon if needed.   I look forward to talking with you again at our next visit. If you have any questions or concerns before then, please call the clinic at [phone].  Take Care,   Dr Phill Myron

## 2013-11-09 NOTE — Assessment & Plan Note (Signed)
Assessment:Chronic and unchanged  Plan - Increase Flonase to twice a day for one to 2 weeks - Start Zyrtec; patient advised.  He can switch to Claritin or Allegra if and no benefit in 3-4 weeks

## 2013-11-09 NOTE — Assessment & Plan Note (Signed)
Recent sleep study shows obstructive sleep apnea - Will order home health today to get new CPAP machine - he will call if has not heard from them in 2-3 weeks

## 2013-11-22 ENCOUNTER — Encounter: Payer: Self-pay | Admitting: Gastroenterology

## 2013-12-23 ENCOUNTER — Encounter: Payer: Self-pay | Admitting: Family Medicine

## 2014-01-04 ENCOUNTER — Ambulatory Visit (INDEPENDENT_AMBULATORY_CARE_PROVIDER_SITE_OTHER): Payer: Medicare Other | Admitting: Family Medicine

## 2014-01-04 ENCOUNTER — Encounter: Payer: Self-pay | Admitting: Family Medicine

## 2014-01-04 VITALS — BP 106/69 | HR 76 | Temp 98.2°F | Wt 155.0 lb

## 2014-01-04 DIAGNOSIS — I10 Essential (primary) hypertension: Secondary | ICD-10-CM

## 2014-01-04 DIAGNOSIS — Z23 Encounter for immunization: Secondary | ICD-10-CM

## 2014-01-04 DIAGNOSIS — G4733 Obstructive sleep apnea (adult) (pediatric): Secondary | ICD-10-CM

## 2014-01-04 DIAGNOSIS — F411 Generalized anxiety disorder: Secondary | ICD-10-CM

## 2014-01-04 DIAGNOSIS — J309 Allergic rhinitis, unspecified: Secondary | ICD-10-CM

## 2014-01-04 DIAGNOSIS — F1010 Alcohol abuse, uncomplicated: Secondary | ICD-10-CM

## 2014-01-04 NOTE — Patient Instructions (Signed)
It was great seeing you today.   1. Try benadryl and Flonase at night for one month. Continue if nasal congestion improves.    Please bring all your medications to every doctors visit  Sign up for My Chart to have easy access to your labs results, and communication with your Primary care physician.  Next Appointment  Please call to make an appointment with Dr Berkley Harvey in 1 month if nasal congestion doesn't improve.    I look forward to talking with you again at our next visit. If you have any questions or concerns before then, please call the clinic at [phone].  Take Care,   Dr Phill Myron

## 2014-01-04 NOTE — Assessment & Plan Note (Signed)
Unsure if he has HTN, was only using atenolol "as needed" ~ once a month - Discontinue Atenolol - Continue to monitor BP

## 2014-01-04 NOTE — Assessment & Plan Note (Signed)
Denies alcohol use for greater than 10 years

## 2014-01-04 NOTE — Progress Notes (Signed)
  Patient name: Larkin Morelos MRN [id number]  Date of birth: 1955-08-03  CC & HPI:  Kashden Deboy is a 58 y.o. male presenting today for sleep apnea and nasal congestion.  Sleep apnea - He completed the sleep study and has a new CPAP machine. Reports occasional use but often takes it off during the night due to "anxiety" and feeling like he can't breath.   HTN - Was only using atenolol "as needed" for elevated blood pressure during anxiety attacks. Only used once in past month. Denies CP but continues to endorse chronic SOB.  Denies tobacco or alcohol use for many years  Nasal congestion - mostly at night; stop using Antihistamine and only using Flonase occasionally  - denies fevers, chills, sore throat   ROS: See HPI   Medical & Surgical Hx:  Reviewed  Medications & Allergies: Reviewed  Social History: Reviewed:   Objective Findings:  Vitals: BP 106/69  Pulse 76  Temp(Src) 98.2 F (36.8 C) (Oral)  Wt 155 lb (70.308 kg)  Gen: NAD CV: RRR w/o m/r/g, pulses +2 b/l Resp: CTAB w/ normal respiratory effort   Assessment & Plan:   Please See Problem Focused Assessment & Plan

## 2014-01-04 NOTE — Assessment & Plan Note (Signed)
No improvement with intermittent use of Flonase and antihistamine - Plus compliance with Flonase and Benadryl for one month; reassess at that time

## 2014-01-04 NOTE — Assessment & Plan Note (Signed)
Moderate compliance with CPAP machine; reports anxiety attacks and difficulty breathing during the night - Continue CPAP as tolerated

## 2014-01-04 NOTE — Assessment & Plan Note (Signed)
Using Xanax when necessary; one half tabs a day - Will discuss other treatment options with a [employer]

## 2014-02-07 ENCOUNTER — Other Ambulatory Visit: Payer: Self-pay | Admitting: *Deleted

## 2014-02-07 MED ORDER — FLUTICASONE PROPIONATE 50 MCG/ACT NA SUSP: NASAL | Status: DC

## 2014-02-11 ENCOUNTER — Encounter (HOSPITAL_COMMUNITY): Payer: Self-pay

## 2014-02-11 ENCOUNTER — Emergency Department (HOSPITAL_COMMUNITY)
Admission: EM | Admit: 2014-02-11 | Discharge: 2014-02-11 | Disposition: A | Discharge status: Home / Self Care | Payer: Medicare Other | Attending: Emergency Medicine | Admitting: Emergency Medicine

## 2014-02-11 ENCOUNTER — Emergency Department (HOSPITAL_COMMUNITY): Payer: Medicare Other

## 2014-02-11 ENCOUNTER — Ambulatory Visit (INDEPENDENT_AMBULATORY_CARE_PROVIDER_SITE_OTHER): Payer: Medicare Other | Admitting: Emergency Medicine

## 2014-02-11 VITALS — BP 154/102 | HR 92 | Temp 99.5°F | Resp 20

## 2014-02-11 DIAGNOSIS — R51 Headache: Secondary | ICD-10-CM

## 2014-02-11 DIAGNOSIS — G8929 Other chronic pain: Secondary | ICD-10-CM | POA: Insufficient documentation

## 2014-02-11 DIAGNOSIS — R410 Disorientation, unspecified: Secondary | ICD-10-CM

## 2014-02-11 DIAGNOSIS — R0789 Other chest pain: Secondary | ICD-10-CM

## 2014-02-11 DIAGNOSIS — R42 Dizziness and giddiness: Secondary | ICD-10-CM

## 2014-02-11 DIAGNOSIS — Z8619 Personal history of other infectious and parasitic diseases: Secondary | ICD-10-CM | POA: Insufficient documentation

## 2014-02-11 DIAGNOSIS — Z9889 Other specified postprocedural states: Secondary | ICD-10-CM | POA: Diagnosis not present

## 2014-02-11 DIAGNOSIS — I10 Essential (primary) hypertension: Secondary | ICD-10-CM | POA: Diagnosis not present

## 2014-02-11 DIAGNOSIS — F319 Bipolar disorder, unspecified: Secondary | ICD-10-CM | POA: Diagnosis not present

## 2014-02-11 DIAGNOSIS — Z87891 Personal history of nicotine dependence: Secondary | ICD-10-CM | POA: Diagnosis not present

## 2014-02-11 DIAGNOSIS — R4182 Altered mental status, unspecified: Secondary | ICD-10-CM | POA: Diagnosis present

## 2014-02-11 DIAGNOSIS — Z79899 Other long term (current) drug therapy: Secondary | ICD-10-CM | POA: Diagnosis not present

## 2014-02-11 DIAGNOSIS — Z7982 Long term (current) use of aspirin: Secondary | ICD-10-CM | POA: Diagnosis not present

## 2014-02-11 DIAGNOSIS — F410 Panic disorder [episodic paroxysmal anxiety] without agoraphobia: Secondary | ICD-10-CM | POA: Diagnosis not present

## 2014-02-11 DIAGNOSIS — R251 Tremor, unspecified: Secondary | ICD-10-CM

## 2014-02-11 LAB — CBC WITH DIFFERENTIAL/PLATELET
Basophils Absolute: 0 10*3/uL (ref 0.0–0.1)
Basophils Relative: 1 % (ref 0–1)
Eosinophils Absolute: 0.1 10*3/uL (ref 0.0–0.7)
Eosinophils Relative: 1 % (ref 0–5)
HCT: 40.4 % (ref 39.0–52.0)
Hemoglobin: 13.7 g/dL (ref 13.0–17.0)
Lymphocytes Relative: 26 % (ref 12–46)
Lymphs Abs: 1.8 10*3/uL (ref 0.7–4.0)
MCH: 26.2 pg (ref 26.0–34.0)
MCHC: 33.9 g/dL (ref 30.0–36.0)
MCV: 77.2 fL — ABNORMAL LOW (ref 78.0–100.0)
Monocytes Absolute: 0.6 10*3/uL (ref 0.1–1.0)
Monocytes Relative: 9 % (ref 3–12)
Neutro Abs: 4.4 10*3/uL (ref 1.7–7.7)
Neutrophils Relative %: 63 % (ref 43–77)
Platelets: 202 10*3/uL (ref 150–400)
RBC: 5.23 MIL/uL (ref 4.22–5.81)
RDW: 14.1 % (ref 11.5–15.5)
WBC: 6.9 10*3/uL (ref 4.0–10.5)

## 2014-02-11 LAB — URINALYSIS, ROUTINE W REFLEX MICROSCOPIC
Bilirubin Urine: NEGATIVE
Glucose, UA: NEGATIVE mg/dL
Hgb urine dipstick: NEGATIVE
Ketones, ur: NEGATIVE mg/dL
Leukocytes, UA: NEGATIVE
Nitrite: NEGATIVE
Protein, ur: NEGATIVE mg/dL
Specific Gravity, Urine: 1.005 (ref 1.005–1.030)
Urobilinogen, UA: 0.2 mg/dL (ref 0.0–1.0)
pH: 8 (ref 5.0–8.0)

## 2014-02-11 LAB — COMPREHENSIVE METABOLIC PANEL
ALT: 15 U/L (ref 0–53)
AST: 20 U/L (ref 0–37)
Albumin: 4.3 g/dL (ref 3.5–5.2)
Alkaline Phosphatase: 71 U/L (ref 39–117)
Anion gap: 13 (ref 5–15)
BUN: 8 mg/dL (ref 6–23)
CO2: 25 mEq/L (ref 19–32)
Calcium: 9.6 mg/dL (ref 8.4–10.5)
Chloride: 108 mEq/L (ref 96–112)
Creatinine, Ser: 0.91 mg/dL (ref 0.50–1.35)
GFR calc Af Amer: >90 mL/min (ref >90)
GFR calc non Af Amer: >90 mL/min (ref >90)
Glucose, Bld: 99 mg/dL (ref 70–99)
Potassium: 4.3 mEq/L (ref 3.7–5.3)
Sodium: 146 mEq/L (ref 137–147)
Total Bilirubin: 0.4 mg/dL (ref 0.3–1.2)
Total Protein: 7.7 g/dL (ref 6.0–8.3)

## 2014-02-11 LAB — ACETAMINOPHEN LEVEL: Acetaminophen (Tylenol), Serum: <15 ug/mL (ref 10–30)

## 2014-02-11 LAB — RAPID URINE DRUG SCREEN, HOSP PERFORMED
Amphetamines: NOT DETECTED
Barbiturates: NOT DETECTED
Benzodiazepines: NOT DETECTED
Cocaine: NOT DETECTED
Opiates: NOT DETECTED
Tetrahydrocannabinol: NOT DETECTED

## 2014-02-11 LAB — I-STAT TROPONIN, ED: Troponin i, poc: 0 ng/mL (ref 0.00–0.08)

## 2014-02-11 LAB — ETHANOL: Alcohol, Ethyl (B): <11 mg/dL (ref 0–11)

## 2014-02-11 LAB — SALICYLATE LEVEL: Salicylate Lvl: <2 mg/dL — ABNORMAL LOW (ref 2.8–20.0)

## 2014-02-11 LAB — GLUCOSE, POCT (MANUAL RESULT ENTRY): POC Glucose: 105 mg/dl — AB (ref 70–99)

## 2014-02-11 MED ORDER — SODIUM CHLORIDE 0.9 % IV BOLUS (SEPSIS)
1000.0000 mL | Freq: Once | INTRAVENOUS | Status: AC
  Administered 2014-02-11: 1000 mL via INTRAVENOUS

## 2014-02-11 NOTE — ED Notes (Signed)
Bed: [facility] Expected date:  Expected time:  Means of arrival:  Comments: EMS-med seeking

## 2014-02-11 NOTE — ED Notes (Signed)
Pt presents with c/o altered mental status. Pt was seen at Urgent Care for feeling disoriented for the past month. Pt reports that he was unable to take the disorientation any longer so he went to UC and was then directed here. Pt reported to EMS that he is under a lot of stress and may have taken too much of his xanax. Pt has a 20 g in his left wrist.

## 2014-02-11 NOTE — ED Provider Notes (Signed)
CSN: [id number]     Arrival date & time 02/11/14  1523 History   First MD Initiated Contact with Patient 02/11/14 1545     Chief Complaint  Patient presents with  . Altered Mental Status     (Consider location/radiation/quality/duration/timing/severity/associated sxs/prior Treatment) The history is provided by the patient.  [name] is a 58 y.o. male hx of HTN, bipolar, depression here with more forgetfulness and confusion. Symptoms for several months to a year. 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 He also gets very tired and very often. Had an episode of syncope a week ago after being in class. Denies any chest pain or shortness of breath or cough. He has been more stressed out recently. He initially cut back on his Xanax but now is taking as prescribed. Denies overdose on any medicines or drinking alcohol or doing any drugs. Denies any history of dementia.denies any fevers chills or abdominal pain.    Past Medical History  Diagnosis Date  . Hypertension   . Bipolar disorder   . Allergy   . Depression   . Hepatitis C   . Chronic pain   . Panic attack   . Dyspnea    Past Surgical History  Procedure Laterality Date  . Tonsillectomy    . Cardiac catheterization  2003    No CAD  . Sinus surgery     No family history on file. History  Substance Use Topics  . Smoking status: Former Smoker -- 2.00 packs/day for 16 years    Types: Cigarettes    Quit date: 04/08/2002  . Smokeless tobacco: Never Used  . Alcohol Use: No     Comment: Quit in 1987    Review of Systems  Psychiatric/Behavioral: Positive for confusion and decreased concentration. The patient is nervous/anxious.   All other systems reviewed and are negative.     Allergies  Morphine and related  Home Medications   Prior to Admission medications   Medication Sig Start Date End Date Taking? Authorizing Provider  ALPRAZolam Duanne Moron) 0.5 MG tablet Take 0.125 mg by mouth 3 (three)  times daily as needed for anxiety.    Yes Historical Provider, MD  aspirin EC 81 MG tablet Take 81 mg by mouth daily.    Yes Historical Provider, MD  cholecalciferol (VITAMIN D) 1000 UNITS tablet Take 2,000 Units by mouth daily.   Yes Historical Provider, MD  Cyanocobalamin (VITAMIN B-12 PO) Take 1 tablet by mouth daily.   Yes Historical Provider, MD  fluticasone (FLONASE) 50 MCG/ACT nasal spray Place 2 sprays into both nostrils daily as needed for allergies.    Yes Historical Provider, MD  gabapentin (NEURONTIN) 300 MG capsule Take 600-800 mg by mouth 3 (three) times daily.  05/29/13  Yes Historical Provider, MD  lamoTRIgine (LAMICTAL) 200 MG tablet Take 200 mg by mouth at bedtime.  07/30/13  Yes Historical Provider, MD  mirtazapine (REMERON) 45 MG tablet Take 45 mg by mouth at bedtime.   Yes Historical Provider, MD  Multiple Vitamin (MULTIVITAMIN WITH MINERALS) TABS Take 1 tablet by mouth daily.    Yes Historical Provider, MD  oxyCODONE (OXY IR/ROXICODONE) 5 MG immediate release tablet Take 1-2 tablets (5-10 mg total) by mouth every 4 (four) hours as needed for severe pain. 09/03/13  Yes Nobie Putnam, DO  vitamin C (ASCORBIC ACID) 500 MG tablet Take 500 mg by mouth daily.   Yes Historical Provider, MD  atenolol (TENORMIN) 50 MG tablet Take 50 mg by  mouth daily as needed (if systolic blood pressure over 135).     Historical Provider, MD  Fiber POWD Take 1 packet by mouth daily as needed (constipation). Mix in 8 oz liquid and drink    Historical Provider, MD  fluticasone (FLONASE) 50 MCG/ACT nasal spray USE 2 SPRAYS IN THE NOSE DAILY. 02/07/14   Olam Idler, MD  lidocaine (LIDODERM) 5 % Place 2 patches onto the skin daily.  08/19/13   Historical Provider, MD  Melatonin 10 MG CAPS Take 10 mg by mouth at bedtime as needed (sleep).     Historical Provider, MD   BP 129/89 mmHg  Pulse 72  Temp(Src) 97.7 F (36.5 C) (Oral)  Resp 20  SpO2 99% Physical Exam  Constitutional: He is oriented to  person, place, and time. He appears well-developed and well-nourished.  Anxious   HENT:  Head: Normocephalic.  Mouth/Throat: Oropharynx is clear and moist.  Eyes: Conjunctivae and EOM are normal. Pupils are equal, round, and reactive to light.  Neck: Normal range of motion. Neck supple.  Cardiovascular: Normal rate, regular rhythm and normal heart sounds.   Pulmonary/Chest: Effort normal and breath sounds normal. No respiratory distress. He has no wheezes. He has no rales.  Abdominal: Soft. Bowel sounds are normal. He exhibits no distension. There is no tenderness. There is no rebound and no guarding.  Musculoskeletal: Normal range of motion. He exhibits no edema or tenderness.  Neurological: He is alert and oriented to person, place, and time. No cranial nerve deficit. Coordination normal.  Skin: Skin is warm and dry.  Psychiatric:  Anxious, not suicidal   Nursing note and vitals reviewed.   ED Course  Procedures (including critical care time) Labs Review Labs Reviewed  CBC WITH DIFFERENTIAL - Abnormal; Notable for the following:    MCV 77.2 (*)    All other components within normal limits  SALICYLATE LEVEL - Abnormal; Notable for the following:    Salicylate Lvl <4.3 (*)    All other components within normal limits  COMPREHENSIVE METABOLIC PANEL  ETHANOL  ACETAMINOPHEN LEVEL  URINE RAPID DRUG SCREEN (HOSP PERFORMED)  URINALYSIS, ROUTINE W REFLEX MICROSCOPIC  I-STAT TROPOININ, ED    Imaging Review Ct Head Wo Contrast  02/11/2014   CLINICAL DATA:  Altered mental status, disorientation for 1 month  EXAM: CT HEAD WITHOUT CONTRAST  TECHNIQUE: Contiguous axial images were obtained from the base of the skull through the vertex without intravenous contrast.  COMPARISON:  04/26/2012  FINDINGS: No acute hemorrhage, infarct, or mass lesion is identified. No midline shift. Ventricles are normal in size. Orbits and paranasal sinuses are unremarkable. No skull fracture. Punctate radiopacity  over the right globe image 2 again noted.  IMPRESSION: No acute intracranial finding.   Electronically Signed   By: Conchita Paris M.D.   On: 02/11/2014 16:12     EKG Interpretation   Date/Time:  Friday February 11 2014 15:45:35 EST Ventricular Rate:  73 PR Interval:  143 QRS Duration: 99 QT Interval:  389 QTC Calculation: 429 R Axis:   99 Text Interpretation:  Sinus rhythm Borderline right axis deviation No  significant change since last tracing Confirmed by Katiana Ruland  MD, Shirin Echeverry ([id number])  on 02/11/2014 4:07:23 PM      MDM   Final diagnoses:  Confusion    [name] is a 58 y.o. male here with intermittent confusion. Alert and oriented now, neuro exam unremarkable. Consider dementia vs brain mass vs electrolyte abnormality. Will check labs, CT head, UA.  6:04 PM CT head unremarkable. Labs unremarkable. Likely dementia. Will refer to neuro outpatient for dementia testing. UDS neg, no signs of overdose.    Wandra Arthurs, MD 02/11/14 [phone]

## 2014-02-11 NOTE — ED Notes (Signed)
Pt reports that the symptoms really started approx 6 years ago and that he feels like "his thinking" has gotten worse and worse. Pt reports that he is actually starting to feel better now. Pt reports he is on disability for his back and has a hard time sitting.

## 2014-02-11 NOTE — Progress Notes (Signed)
Subjective:    Patient ID: [name], male    DOB: September 27, 1955, 58 y.o.   MRN: [id number] This chart was scribed for [place]. [name], MD by Steva Colder, ED Scribe. The patient was seen in room 7 at 2:43 PM.   No chief complaint on file.   HPI [name] is a 58 y.o. male who presents today complaining of dizziness and confusion chest tightness without pain onset this morning PTA. He states that this has been going on for a month now. He states that it started at work this morning and he thought he could push through it. He states that he has a hard time thinking and processing thoughts.   He states that he is having associated symptoms of mild HA. He states that he last took a pain pill oxyCODONE at 6 AM and noon today.   He denies abdominal pain, weakness in extremities, and any other symptoms. He denies drinking currently. He states that he has stopped years ago. He states that he has not taken anything different than he normally does. He states that he tries to make sure that he takes his pain medications six hours apart. He states that he has cut back on his medications. He states that he drove himself today and he has a hard time driving.     Patient Active Problem List   Diagnosis Date Noted  . Dyspnea on exertion 09/07/2012  . OBSTRUCTIVE SLEEP APNEA 09/20/2009  . HYPERTENSION, BENIGN 10/19/2008  . RHINITIS 05/13/2007  . HEPATITIS C 06/05/2006  . DEPRESSION, MAJOR, RECURRENT 06/05/2006  . BIPOLAR DISORDER 06/05/2006  . ANXIETY 06/05/2006  . Alcohol abuse, unspecified 06/05/2006  . BACK PAIN, LOW 06/05/2006   Past Medical History  Diagnosis Date  . Hypertension   . Bipolar disorder   . Allergy   . Depression   . Hepatitis C   . Chronic pain   . Panic attack   . Dyspnea    Past Surgical History  Procedure Laterality Date  . Tonsillectomy    . Cardiac catheterization  2003    No CAD  . Sinus surgery     Allergies  Allergen Reactions  . Morphine And Related  Shortness Of Breath   Prior to Admission medications   Medication Sig Start Date End Date Taking? Authorizing Provider  ALPRAZolam Duanne Moron) 0.5 MG tablet Take 0.167 mg by mouth 3 (three) times daily.    Historical Provider, MD  aspirin EC 81 MG tablet Take 81 mg by mouth daily.     Historical Provider, MD  atenolol (TENORMIN) 50 MG tablet Take 50 mg by mouth daily as needed (if systolic blood pressure over 135).     Historical Provider, MD  Cyanocobalamin (VITAMIN B-12 PO) Take 1 tablet by mouth daily.    Historical Provider, MD  Fiber POWD Take 1 packet by mouth daily as needed (constipation). Mix in 8 oz liquid and drink    Historical Provider, MD  fluticasone (FLONASE) 50 MCG/ACT nasal spray USE 2 SPRAYS IN THE NOSE DAILY. 02/07/14   Olam Idler, MD  gabapentin (NEURONTIN) 300 MG capsule Take 300-600 mg by mouth 3 (three) times daily. Take 1 capsule (300 mg) twice daily, and take 2 capsules (600 mg) at bedtime 05/29/13   Historical Provider, MD  lamoTRIgine (LAMICTAL) 200 MG tablet Take 200 mg by mouth at bedtime.  07/30/13   Historical Provider, MD  lidocaine (LIDODERM) 5 % Place 2 patches onto the skin daily.  08/19/13  Historical Provider, MD  Melatonin 10 MG CAPS Take 10 mg by mouth at bedtime as needed (sleep).     Historical Provider, MD  mirtazapine (REMERON) 45 MG tablet Take 45 mg by mouth at bedtime.    Historical Provider, MD  Multiple Vitamin (MULTIVITAMIN WITH MINERALS) TABS Take 1 tablet by mouth daily.     Historical Provider, MD  oxyCODONE (OXY IR/ROXICODONE) 5 MG immediate release tablet Take 1-2 tablets (5-10 mg total) by mouth every 4 (four) hours as needed for severe pain. 09/03/13   Nobie Putnam, DO      Review of Systems  Respiratory: Positive for chest tightness.   Cardiovascular: Negative for chest pain.  Neurological: Positive for dizziness. Negative for weakness.  Psychiatric/Behavioral: Positive for confusion.  All other systems reviewed and are  negative.      Objective:   Physical Exam  Constitutional: He is oriented to person, place, and time. He appears well-developed and well-nourished. No distress.  Anxious male and not in any acute distress. Appears somnolent. Answers questions perfectly. Oriented to person place and time.  HENT:  Head: Normocephalic and atraumatic.  Eyes: EOM are normal.  Neck: Neck supple. No tracheal deviation present.  Cardiovascular: Normal rate.   Pulmonary/Chest: Effort normal. No respiratory distress.  Musculoskeletal: Normal range of motion.  Neurological: He is alert and oriented to person, place, and time.  Skin: Skin is warm and dry.  Psychiatric: He has a normal mood and affect. His behavior is normal.  Nursing note and vitals reviewed.   Results for orders placed or performed in visit on 02/11/14  POCT glucose (manual entry)  Result Value Ref Range   POC Glucose 105 (A) 70 - 99 mg/dl     There were no vitals taken for this visit.  Assessment & Plan:  COORDINATION OF CARE: 2:55 PM-Discussed treatment plan which includes EMS to hospital for further workup with pt at bedside and pt agreed to plan.  Patient presents with altered mental status. He is on multiple medications that could present  this way. Patient sent the EMS to the hospital for further evaluation.  I personally performed the services described in this documentation, which was scribed in my presence. The recorded information has been reviewed and is accurate.

## 2014-02-11 NOTE — Discharge Instructions (Signed)
You may have early dementia symptoms.   You should see neurologist for further testing.   Return to ER if you have worse confusion, loss of memory.

## 2014-02-28 ENCOUNTER — Encounter: Payer: Self-pay | Admitting: Neurology

## 2014-02-28 ENCOUNTER — Ambulatory Visit (INDEPENDENT_AMBULATORY_CARE_PROVIDER_SITE_OTHER): Payer: Medicare Other | Admitting: Neurology

## 2014-02-28 VITALS — BP 134/94 | HR 88 | Ht 67.0 in | Wt 160.0 lb

## 2014-02-28 DIAGNOSIS — M545 Low back pain, unspecified: Secondary | ICD-10-CM | POA: Insufficient documentation

## 2014-02-28 DIAGNOSIS — M5440 Lumbago with sciatica, unspecified side: Secondary | ICD-10-CM

## 2014-02-28 DIAGNOSIS — F05 Delirium due to known physiological condition: Secondary | ICD-10-CM | POA: Insufficient documentation

## 2014-02-28 NOTE — Progress Notes (Signed)
PATIENT: [name] DOB: April 09, 1955  HISTORICAL  [name] is a 58 years old ambidextrous male, referred by emergency room Dr. Darl Householder, for evaluation of confusion   He had a past medical history of hypertension, bipolar disorder, previous polysubstance abuse, including illicit drugs, and prescription drugs, methadone use, he is currently under pain management Dr. Nicholaus Bloom for low back pain, also psychiatrist, is taking Lamictal 200 mg every day, Remeron 40 mg every night, was taking Neurontin 1200 mg 3 times a day, but he stated he is on lower dose now, also taking Xanax 1 mg, up to 2 tablets each day.   He continue have chronic low back pain, getting worse when getting up, sitting down, or walking, he tends to lie down in bed resting a lot, continual suffered depression, anxiety,  He also complains of become more forgetful over the past few years, forgetting to pay his bills, he presented to emergency room February 11 2014, for worsening low back pain, confusion, difficulty handling small electronic device,  CAT scan of the brain was normal, UDS was positive for benzodiazepine, previous UDS was positive for opiates, normal CMP, CBC, UA.  He is taking Xanax per patient, 1 mg, up to 2 tablets each day from his psychiatrist for his anxiety,  REVIEW OF SYSTEMS: Full 14 system review of systems performed and notable only for fatigue, ringing in ear, blurry vision, shortness of breath, snoring, increased thirst, joint pain, achy muscles, memory loss, confusion, headaches, numbness, slurred speech, weakness, depression anxiety, snoring,  disinterest in activities,  ALLERGIES: Allergies  Allergen Reactions  . Morphine And Related Shortness Of Breath    HOME MEDICATIONS: Current Outpatient Prescriptions on File Prior to Visit  Medication Sig Dispense Refill  . ALPRAZolam (XANAX) 0.5 MG tablet Take 0.125 mg by mouth 3 (three) times daily as needed for anxiety.     Marland Kitchen aspirin EC 81 MG  tablet Take 81 mg by mouth daily.     . cholecalciferol (VITAMIN D) 1000 UNITS tablet Take 2,000 Units by mouth daily.    . Cyanocobalamin (VITAMIN B-12 PO) Take 1 tablet by mouth daily.    . Fiber POWD Take 1 packet by mouth daily as needed (constipation). Mix in 8 oz liquid and drink    . fluticasone (FLONASE) 50 MCG/ACT nasal spray USE 2 SPRAYS IN THE NOSE DAILY. 16 g 2  . lamoTRIgine (LAMICTAL) 200 MG tablet Take 200 mg by mouth at bedtime.     . lidocaine (LIDODERM) 5 % Place 2 patches onto the skin daily.     . Melatonin 10 MG CAPS Take 10 mg by mouth at bedtime as needed (sleep).     . mirtazapine (REMERON) 45 MG tablet Take 45 mg by mouth at bedtime.    . Multiple Vitamin (MULTIVITAMIN WITH MINERALS) TABS Take 1 tablet by mouth daily.     . vitamin C (ASCORBIC ACID) 500 MG tablet Take 500 mg by mouth daily.     Current Facility-Administered Medications on File Prior to Visit  Medication Dose Route Frequency Provider Last Rate Last Dose  . 0.9 %  sodium chloride infusion   Intravenous Continuous Montez Morita, MD 20 mL/hr at 09/15/12 1635 500 mL at 09/15/12 1635    PAST MEDICAL HISTORY: Past Medical History  Diagnosis Date  . Hypertension   . Bipolar disorder   . Allergy   . Depression   . Hepatitis C   . Chronic pain   . Panic attack   .  Dyspnea     PAST SURGICAL HISTORY: Past Surgical History  Procedure Laterality Date  . Tonsillectomy    . Cardiac catheterization  2003    No CAD  . Sinus surgery      FAMILY HISTORY: History reviewed. No pertinent family history.  SOCIAL HISTORY:  History   Social History  . Marital Status: Divorced    Spouse Name: N/A    Number of Children: 0  . Years of Education: GED   Occupational History  . DISABLED   .      Social History Main Topics  . Smoking status: Former Smoker -- 2.00 packs/day for 16 years    Types: Cigarettes    Quit date: 04/08/2002  . Smokeless tobacco: Never Used  . Alcohol Use: No      Comment: Quit in 1987  . Drug Use: No     Comment: Use to take cocaine, marijuana, per patient everything. Stopped in 1987.  Marland Kitchen Sexual Activity:    Partners: Female   Other Topics Concern  . Not on file   Social History Narrative   Patient lives at home alone.    Disabled.   Education GED    Both handed.   Caffeine two cups of coffee daily.           PHYSICAL EXAM   Filed Vitals:   02/28/14 0851  BP: 134/94  Pulse: 88  Height: 5\' 7"  (1.702 m)  Weight: 160 lb (72.576 kg)    Not recorded      Body mass index is 25.05 kg/(m^2).   Generalized: In no acute distress  Neck: Supple, no carotid bruits   Cardiac: Regular rate rhythm  Pulmonary: Clear to auscultation bilaterally  Musculoskeletal: No deformity  Neurological examination  Mentation: Alert oriented to time, place, history taking, and causual conversation, depressed looking middle age male,  Cranial nerve II-XII: Pupils were equal round reactive to light. Extraocular movements were full.  Visual field were full on confrontational test. Bilateral fundi were sharp.  Facial sensation and strength were normal. Hearing was intact to finger rubbing bilaterally. Uvula tongue midline.  Head turning and shoulder shrug and were normal and symmetric.Tongue protrusion into cheek strength was normal.  Motor: Normal tone, bulk and strength.  Sensory: Intact to fine touch, pinprick, preserved vibratory sensation, and proprioception at toes.  Coordination: Normal finger to nose, heel-to-shin bilaterally there was no truncal ataxia  Gait: Rising up from seated position without assistance, normal stance, without trunk ataxia, moderate stride, good arm swing, smooth turning, able to perform tiptoe, and heel walking without difficulty.   Romberg signs: Negative  Deep tendon reflexes: Brachioradialis 2/2, biceps 2/2, triceps 2/2, patellar 2/2, Achilles 2/2, plantar responses were flexor bilaterally.   DIAGNOSTIC DATA  (LABS, IMAGING, TESTING) - I reviewed patient records, labs, notes, testing and imaging myself where available.  Lab Results  Component Value Date   WBC 6.9 02/11/2014   HGB 13.7 02/11/2014   HCT 40.4 02/11/2014   MCV 77.2* 02/11/2014   PLT 202 02/11/2014      Component Value Date/Time   NA 146 02/11/2014 1608   K 4.3 02/11/2014 1608   CL 108 02/11/2014 1608   CO2 25 02/11/2014 1608   GLUCOSE 99 02/11/2014 1608   BUN 8 02/11/2014 1608   CREATININE 0.91 02/11/2014 1608   CREATININE 0.83 09/28/2013 1101   CALCIUM 9.6 02/11/2014 1608   PROT 7.7 02/11/2014 1608   ALBUMIN 4.3 02/11/2014 1608   AST 20 02/11/2014  1608   ALT 15 02/11/2014 1608   ALKPHOS 71 02/11/2014 1608   BILITOT 0.4 02/11/2014 1608   GFRNONAA >90 02/11/2014 1608   GFRAA >90 02/11/2014 1608   Lab Results  Component Value Date   CHOL 145 09/16/2012   HDL 42 09/16/2012   LDLCALC 70 09/16/2012   LDLDIRECT 70 04/24/2011   TRIG 96 08/27/2013   CHOLHDL 3.5 09/16/2012   Lab Results  Component Value Date   HGBA1C 5.5 09/15/2012   Lab Results  Component Value Date   VITAMINB12 1271* 08/31/2013   Lab Results  Component Value Date   TSH 1.419 03/09/2013      ASSESSMENT AND PLAN  [name] is a 58 y.o. male complains of chronic low back pain, mood disorder, previous history of polysubstance abuse, including prescription drug abuse, presenting with constant low back pain, gradually worsening memory disorder, mild confusion, which has improved, patient attributed his symptoms to overuse of prescription medications, next  Recent laboratory showed normal B12, TSH, CMP, CBC  All his complains of chronic, no evidence of lumbar radiculopathy, his low back pain most likely musculoskeletal etiology, his complains of memory trouble, likely due to polypharmacy, also complicated by his mood disorder, chronic pain   after discuss with patient, he does not want further evaluation at this point, we will continue his  care with his psychologist, and pain management, only return to clinic for new issues.     Marcial Pacas, M.D. Ph.D.  [facility] [address], [place], [place] [address] [phone]

## 2014-05-13 ENCOUNTER — Emergency Department (INDEPENDENT_AMBULATORY_CARE_PROVIDER_SITE_OTHER)
Admission: EM | Admit: 2014-05-13 | Discharge: 2014-05-13 | Disposition: A | Discharge status: Nursing Home (Includes ICF) | Payer: Medicare Other | Source: Home / Self Care | Attending: Family Medicine | Admitting: Family Medicine

## 2014-05-13 ENCOUNTER — Encounter (HOSPITAL_COMMUNITY): Payer: Self-pay | Admitting: Family Medicine

## 2014-05-13 ENCOUNTER — Emergency Department (HOSPITAL_COMMUNITY)
Admission: EM | Admit: 2014-05-13 | Discharge: 2014-05-13 | Disposition: A | Discharge status: Home / Self Care | Payer: Medicare Other | Attending: Emergency Medicine | Admitting: Emergency Medicine

## 2014-05-13 ENCOUNTER — Encounter (HOSPITAL_COMMUNITY): Payer: Self-pay | Admitting: Emergency Medicine

## 2014-05-13 DIAGNOSIS — F319 Bipolar disorder, unspecified: Secondary | ICD-10-CM | POA: Insufficient documentation

## 2014-05-13 DIAGNOSIS — F410 Panic disorder [episodic paroxysmal anxiety] without agoraphobia: Secondary | ICD-10-CM | POA: Diagnosis not present

## 2014-05-13 DIAGNOSIS — Z7951 Long term (current) use of inhaled steroids: Secondary | ICD-10-CM | POA: Diagnosis not present

## 2014-05-13 DIAGNOSIS — R1084 Generalized abdominal pain: Secondary | ICD-10-CM | POA: Insufficient documentation

## 2014-05-13 DIAGNOSIS — Z79899 Other long term (current) drug therapy: Secondary | ICD-10-CM | POA: Diagnosis not present

## 2014-05-13 DIAGNOSIS — I10 Essential (primary) hypertension: Secondary | ICD-10-CM | POA: Diagnosis not present

## 2014-05-13 DIAGNOSIS — Z87891 Personal history of nicotine dependence: Secondary | ICD-10-CM | POA: Diagnosis not present

## 2014-05-13 DIAGNOSIS — R109 Unspecified abdominal pain: Secondary | ICD-10-CM

## 2014-05-13 DIAGNOSIS — Z8619 Personal history of other infectious and parasitic diseases: Secondary | ICD-10-CM | POA: Insufficient documentation

## 2014-05-13 DIAGNOSIS — Z7982 Long term (current) use of aspirin: Secondary | ICD-10-CM | POA: Diagnosis not present

## 2014-05-13 DIAGNOSIS — G8929 Other chronic pain: Secondary | ICD-10-CM | POA: Diagnosis not present

## 2014-05-13 DIAGNOSIS — R634 Abnormal weight loss: Secondary | ICD-10-CM

## 2014-05-13 LAB — COMPREHENSIVE METABOLIC PANEL
ALT: 21 U/L (ref 0–53)
AST: 27 U/L (ref 0–37)
Albumin: 5 g/dL (ref 3.5–5.2)
Alkaline Phosphatase: 68 U/L (ref 39–117)
Anion gap: 7 (ref 5–15)
BUN: 15 mg/dL (ref 6–23)
CO2: 28 mmol/L (ref 19–32)
Calcium: 10.3 mg/dL (ref 8.4–10.5)
Chloride: 103 mmol/L (ref 96–112)
Creatinine, Ser: 1.12 mg/dL (ref 0.50–1.35)
GFR calc Af Amer: 83 mL/min — ABNORMAL LOW (ref >90)
GFR calc non Af Amer: 72 mL/min — ABNORMAL LOW (ref >90)
Glucose, Bld: 88 mg/dL (ref 70–99)
Potassium: 4.5 mmol/L (ref 3.5–5.1)
Sodium: 138 mmol/L (ref 135–145)
Total Bilirubin: 0.8 mg/dL (ref 0.3–1.2)
Total Protein: 8 g/dL (ref 6.0–8.3)

## 2014-05-13 LAB — CBC WITH DIFFERENTIAL/PLATELET
Basophils Absolute: 0 10*3/uL (ref 0.0–0.1)
Basophils Relative: 0 % (ref 0–1)
Eosinophils Absolute: 0 10*3/uL (ref 0.0–0.7)
Eosinophils Relative: 0 % (ref 0–5)
HCT: 44.2 % (ref 39.0–52.0)
Hemoglobin: 15.2 g/dL (ref 13.0–17.0)
Lymphocytes Relative: 31 % (ref 12–46)
Lymphs Abs: 2.9 10*3/uL (ref 0.7–4.0)
MCH: 26.7 pg (ref 26.0–34.0)
MCHC: 34.4 g/dL (ref 30.0–36.0)
MCV: 77.5 fL — ABNORMAL LOW (ref 78.0–100.0)
Monocytes Absolute: 0.7 10*3/uL (ref 0.1–1.0)
Monocytes Relative: 7 % (ref 3–12)
Neutro Abs: 5.8 10*3/uL (ref 1.7–7.7)
Neutrophils Relative %: 62 % (ref 43–77)
Platelets: 258 10*3/uL (ref 150–400)
RBC: 5.7 MIL/uL (ref 4.22–5.81)
RDW: 13.6 % (ref 11.5–15.5)
WBC: 9.4 10*3/uL (ref 4.0–10.5)

## 2014-05-13 LAB — LIPASE, BLOOD: Lipase: 28 U/L (ref 11–59)

## 2014-05-13 NOTE — ED Notes (Signed)
Pt will be transported to ED via our shuttle.  Report was called to [facility].  Pt is A&O w/ VSS.

## 2014-05-13 NOTE — ED Provider Notes (Signed)
CSN: [id number]     Arrival date & time 05/13/14  1509 History   First MD Initiated Contact with Patient 05/13/14 1548     Chief Complaint  Patient presents with  . Abdominal Pain   (Consider location/radiation/quality/duration/timing/severity/associated sxs/prior Treatment) Patient is a 59 y.o. male presenting with abdominal pain. The history is provided by the patient.  Abdominal Pain Pain location:  Epigastric Pain quality: burning and stabbing   Pain radiates to:  Does not radiate Pain severity:  Mild Onset quality:  Gradual Duration:  3 weeks Progression:  Unchanged Chronicity:  New Context: not alcohol use and not diet changes   Context comment:  H/o unexplained pancreatitis sev mos ago, is on pain management program, losing wt recently without expla Associated symptoms: no chest pain, no constipation, no diarrhea, no fatigue, no fever, no nausea and no vomiting     Past Medical History  Diagnosis Date  . Hypertension   . Bipolar disorder   . Allergy   . Depression   . Hepatitis C   . Chronic pain   . Panic attack   . Dyspnea    Past Surgical History  Procedure Laterality Date  . Tonsillectomy    . Cardiac catheterization  2003    No CAD  . Sinus surgery     History reviewed. No pertinent family history. History  Substance Use Topics  . Smoking status: Former Smoker -- 2.00 packs/day for 16 years    Types: Cigarettes    Quit date: 04/08/2002  . Smokeless tobacco: Never Used  . Alcohol Use: No     Comment: Quit in 1987    Review of Systems  Constitutional: Negative.  Negative for fever and fatigue.  Cardiovascular: Negative for chest pain.  Gastrointestinal: Positive for abdominal pain. Negative for nausea, vomiting, diarrhea and constipation.    Allergies  Morphine and related  Home Medications   Prior to Admission medications   Medication Sig Start Date End Date Taking? Authorizing Provider  ALPRAZolam Duanne Moron) 0.5 MG tablet Take 0.125 mg by mouth  3 (three) times daily as needed for anxiety.    Yes Historical Provider, MD  aspirin EC 81 MG tablet Take 81 mg by mouth daily.    Yes Historical Provider, MD  lamoTRIgine (LAMICTAL) 200 MG tablet Take 200 mg by mouth at bedtime.  07/30/13  Yes Historical Provider, MD  mirtazapine (REMERON) 45 MG tablet Take 45 mg by mouth at bedtime.   Yes Historical Provider, MD  oxyCODONE (OXY IR/ROXICODONE) 5 MG immediate release tablet Take 5 mg by mouth 3 (three) times daily.   Yes Historical Provider, MD  Calcium Acetate, Phos Binder, (CALCIUM ACETATE PO) Take by mouth daily.    Historical Provider, MD  cholecalciferol (VITAMIN D) 1000 UNITS tablet Take 2,000 Units by mouth daily.    Historical Provider, MD  Cyanocobalamin (VITAMIN B-12 PO) Take 1 tablet by mouth daily.    Historical Provider, MD  Fiber POWD Take 1 packet by mouth daily as needed (constipation). Mix in 8 oz liquid and drink    Historical Provider, MD  fluticasone (FLONASE) 50 MCG/ACT nasal spray USE 2 SPRAYS IN THE NOSE DAILY. 02/07/14   Olam Idler, MD  gabapentin (NEURONTIN) 400 MG capsule Take 400 capsules by mouth as directed. 01/07/14   Historical Provider, MD  lidocaine (LIDODERM) 5 % Place 2 patches onto the skin daily.  08/19/13   Historical Provider, MD  Melatonin 10 MG CAPS Take 10 mg by mouth at bedtime  as needed (sleep).     Historical Provider, MD  Multiple Vitamin (MULTIVITAMIN WITH MINERALS) TABS Take 1 tablet by mouth daily.     Historical Provider, MD  POTASSIUM CHLORIDE PO Take by mouth daily.    Historical Provider, MD  vitamin C (ASCORBIC ACID) 500 MG tablet Take 500 mg by mouth daily.    Historical Provider, MD   BP 130/90 mmHg  Pulse 82  Temp(Src) 98.3 F (36.8 C) (Oral)  Resp 18  SpO2 100% Physical Exam  Abdominal: Soft. Bowel sounds are normal. He exhibits no distension and no mass. There is no hepatosplenomegaly. There is tenderness in the epigastric area. There is no rigidity, no rebound, no guarding, no CVA  tenderness, no tenderness at McBurney's point and negative Murphy's sign.    ED Course  Procedures (including critical care time) Labs Review Labs Reviewed - No data to display  Imaging Review No results found.   MDM   1. Abdominal pain of unknown etiology   2. Unexplained weight loss    Sent for eval of abdominal pain and unexplained wt loss.    Billy Fischer, MD 05/13/14 [phone]

## 2014-05-13 NOTE — ED Provider Notes (Signed)
CSN: [id number]     Arrival date & time 05/13/14  1616 History   First MD Initiated Contact with Patient 05/13/14 1905     Chief Complaint  Patient presents with  . Abdominal Pain     (Consider location/radiation/quality/duration/timing/severity/associated sxs/prior Treatment) HPI Comments: 59 year-old male with history of high blood pressure, sleep apnea, hepatitis C, alcohol abuse presents with recurrent abdominal pain for 3 weeks nonspecific mostly central nonradiating. This does not feel similar to his pancreatitis history but is not her percent sure. Patient has had 10 pound weight loss recently no blood in the stools no recent colonoscopy. Currently no abdominal pain. No specific reason when it comes and goes.  Patient is a 59 y.o. male presenting with abdominal pain. The history is provided by the patient.  Abdominal Pain Associated symptoms: no chest pain, no chills, no diarrhea, no dysuria, no fever, no nausea, no shortness of breath and no vomiting     Past Medical History  Diagnosis Date  . Hypertension   . Bipolar disorder   . Allergy   . Depression   . Hepatitis C   . Chronic pain   . Panic attack   . Dyspnea    Past Surgical History  Procedure Laterality Date  . Tonsillectomy    . Cardiac catheterization  2003    No CAD  . Sinus surgery     History reviewed. No pertinent family history. History  Substance Use Topics  . Smoking status: Former Smoker -- 2.00 packs/day for 16 years    Types: Cigarettes    Quit date: 04/08/2002  . Smokeless tobacco: Never Used  . Alcohol Use: No     Comment: Quit in 1987    Review of Systems  Constitutional: Negative for fever and chills.  HENT: Negative for congestion.   Eyes: Negative for visual disturbance.  Respiratory: Negative for shortness of breath.   Cardiovascular: Negative for chest pain.  Gastrointestinal: Positive for abdominal pain. Negative for nausea, vomiting, diarrhea and blood in stool.  Genitourinary:  Negative for dysuria and flank pain.  Musculoskeletal: Negative for back pain, neck pain and neck stiffness.  Skin: Negative for rash.  Neurological: Negative for light-headedness and headaches.      Allergies  Morphine and related  Home Medications   Prior to Admission medications   Medication Sig Start Date End Date Taking? Authorizing Provider  ALPRAZolam Duanne Moron) 0.5 MG tablet Take 0.125 mg by mouth 3 (three) times daily as needed for anxiety.     Historical Provider, MD  aspirin EC 81 MG tablet Take 81 mg by mouth daily.     Historical Provider, MD  Calcium Acetate, Phos Binder, (CALCIUM ACETATE PO) Take by mouth daily.    Historical Provider, MD  cholecalciferol (VITAMIN D) 1000 UNITS tablet Take 2,000 Units by mouth daily.    Historical Provider, MD  Cyanocobalamin (VITAMIN B-12 PO) Take 1 tablet by mouth daily.    Historical Provider, MD  Fiber POWD Take 1 packet by mouth daily as needed (constipation). Mix in 8 oz liquid and drink    Historical Provider, MD  fluticasone (FLONASE) 50 MCG/ACT nasal spray USE 2 SPRAYS IN THE NOSE DAILY. 02/07/14   Olam Idler, MD  gabapentin (NEURONTIN) 400 MG capsule Take 400 capsules by mouth as directed. 01/07/14   Historical Provider, MD  lamoTRIgine (LAMICTAL) 200 MG tablet Take 200 mg by mouth at bedtime.  07/30/13   Historical Provider, MD  lidocaine (LIDODERM) 5 % Place 2  patches onto the skin daily.  08/19/13   Historical Provider, MD  Melatonin 10 MG CAPS Take 10 mg by mouth at bedtime as needed (sleep).     Historical Provider, MD  mirtazapine (REMERON) 45 MG tablet Take 45 mg by mouth at bedtime.    Historical Provider, MD  Multiple Vitamin (MULTIVITAMIN WITH MINERALS) TABS Take 1 tablet by mouth daily.     Historical Provider, MD  oxyCODONE (OXY IR/ROXICODONE) 5 MG immediate release tablet Take 5 mg by mouth 3 (three) times daily.    Historical Provider, MD  POTASSIUM CHLORIDE PO Take by mouth daily.    Historical Provider, MD  vitamin  C (ASCORBIC ACID) 500 MG tablet Take 500 mg by mouth daily.    Historical Provider, MD   BP 119/81 mmHg  Pulse 70  Temp(Src) 97.6 F (36.4 C)  Resp 8  SpO2 98% Physical Exam  Constitutional: He is oriented to person, place, and time. He appears well-developed and well-nourished.  HENT:  Head: Normocephalic and atraumatic.  Eyes: Conjunctivae are normal. Right eye exhibits no discharge. Left eye exhibits no discharge.  Neck: Normal range of motion. Neck supple. No tracheal deviation present.  Cardiovascular: Normal rate and regular rhythm.   Pulmonary/Chest: Effort normal and breath sounds normal.  Abdominal: Soft. He exhibits no distension. There is no tenderness. There is no guarding.  Musculoskeletal: He exhibits no edema.  Neurological: He is alert and oriented to person, place, and time.  Skin: Skin is warm. No rash noted.  Psychiatric: He has a normal mood and affect.  Nursing note and vitals reviewed.   ED Course  Procedures (including critical care time) Labs Review Labs Reviewed  CBC WITH DIFFERENTIAL/PLATELET - Abnormal; Notable for the following:    MCV 77.5 (*)    All other components within normal limits  COMPREHENSIVE METABOLIC PANEL - Abnormal; Notable for the following:    GFR calc non Af Amer 72 (*)    GFR calc Af Amer 83 (*)    All other components within normal limits  LIPASE, BLOOD  URINALYSIS, ROUTINE W REFLEX MICROSCOPIC    Imaging Review No results found.   EKG Interpretation   Date/Time:  Friday May 13 2014 19:02:59 EST Ventricular Rate:  70 PR Interval:  155 QRS Duration: 96 QT Interval:  398 QTC Calculation: 429 R Axis:   -111 Text Interpretation:  Sinus rhythm Left anterior fascicular block Similar  to previous Confirmed by Toleen Lachapelle  MD, Borna Wessinger ([id number]) on 05/13/2014 7:35:02 PM      MDM   Final diagnoses:  Abdominal pain, generalized   Patient presents nonfocal intermittent abdominal pain for 3 weeks, patient concern for possible  recurrent pink otitis. Patient is not drink alcohol in months. On exam patient has no focal abdominal pain benign exam. Screen blood work and lipase and LFTs normal. Patient does not meet criteria for emergent CT scan this time and discussed close outpatient follow-up. Discussed he will need a colonoscopy in the near future with the weight loss nonspecific abdominal pain. Patient will follow-up with primary care Dr./referral.  Results and differential diagnosis were discussed with the patient/parent/guardian. Close follow up outpatient was discussed, comfortable with the plan.   Medications - No data to display  Filed Vitals:   05/13/14 1854 05/13/14 1858 05/13/14 1900 05/13/14 1945  BP: 123/80  120/83 119/81  Pulse:  71 73 70  Temp:      Resp:  25 18 8   SpO2:  92% 99% 98%  Final diagnoses:  Abdominal pain, generalized        Mariea Clonts, MD 05/13/14 2035

## 2014-05-13 NOTE — ED Notes (Signed)
Pt sent here from [facility] for pancreatitis work up. Denies N,V,D. sts upper abdominal pain x 3 weeks.

## 2014-05-13 NOTE — Discharge Instructions (Signed)
If you were given medicines take as directed.  If you are on coumadin or contraceptives realize their levels and effectiveness is altered by many different medicines.  If you have any reaction (rash, tongues swelling, other) to the medicines stop taking and see a physician.   Please follow up as directed and return to the ER or see a physician for new or worsening symptoms.  Thank you. Filed Vitals:   05/13/14 1625 05/13/14 1854 05/13/14 1858  BP: 119/83 123/80   Pulse: 83  71  Temp: 97.6 F (36.4 C)    Resp: 18  25  SpO2: 98%  92%

## 2014-05-13 NOTE — ED Notes (Signed)
Pt has a history of Pancreatitis from 8 months ago.  Pt started with all over abdominal pain about 3 weeks ago.  Today he states the pain is in the mid/upper abdomen and feels like someone is pinching him.  He reported his issues to his pain doctor and his PCP and they told him to come here for further evaluation.

## 2014-05-16 ENCOUNTER — Encounter: Payer: Self-pay | Admitting: Family Medicine

## 2014-05-16 ENCOUNTER — Ambulatory Visit (INDEPENDENT_AMBULATORY_CARE_PROVIDER_SITE_OTHER): Payer: Medicare Other | Admitting: Family Medicine

## 2014-05-16 VITALS — BP 132/92 | HR 73 | Temp 98.3°F | Ht 67.0 in | Wt 155.0 lb

## 2014-05-16 DIAGNOSIS — R1084 Generalized abdominal pain: Secondary | ICD-10-CM

## 2014-05-16 NOTE — Progress Notes (Signed)
   Subjective:    Patient ID: [name], male    DOB: 1955/12/01, 59 y.o.   MRN: [id number]  HPI  Abdominal pain: x 3weeks, "pinching sensation", through out, 1/10. - pancreatitis last year, normal colonoscopy 2009 - unintentional weight loss ~15lb after intentional loss of 8lb  Depression: reports hx of depression, feels he is doing better than usual.  +S -I -E (did not inquire about sleep as wears cPAP and has difficulty sleeping with it, decreased appetite)  Review of Systems  Constitutional: Negative for fever, chills, diaphoresis and fatigue.  Respiratory: Negative for cough and shortness of breath.   Cardiovascular: Negative for leg swelling.  Gastrointestinal: Positive for abdominal pain and diarrhea (loose stools). Negative for nausea, vomiting, constipation and anal bleeding.  Endocrine: Negative for cold intolerance and heat intolerance.  Genitourinary: Negative for dysuria and difficulty urinating.  Neurological: Negative for headaches.       Objective:   Physical Exam  Constitutional: He appears well-developed and well-nourished. No distress.  HENT:  Mouth/Throat: Mucous membranes are moist. Pharynx is normal.  Eyes: Conjunctivae and EOM are normal.  Neck: No adenopathy.  Cardiovascular: Normal rate, regular rhythm and S2 normal.   Pulmonary/Chest: Effort normal and breath sounds normal.  Abdominal: Soft. He exhibits no distension and no mass. There is no tenderness. There is no rebound and no guarding.  Hyperactive bowel sounds  Musculoskeletal: Normal range of motion.  Neurological: He is alert. No cranial nerve deficit. Coordination normal.  Skin: Skin is warm. No rash noted. He is not diaphoretic. No pallor.  Psychiatric:  Decreased affect      Assessment & Plan:  [name] was seen today for abdominal pain.  Generalized abdominal pain - Ambulatory referral to Gastroenterology - CT Abdomen Pelvis W Contrast; Future - no pain meds - only red flag is weight  loss and fatigue in setting of weight loss.  May be secondary to malabsorption, will likely need stool studies.  However, at this time will order CT with contrast to r/o malignancy.  Refer to GI may need enzyme replacement for possible chronic pancreatitis   Some component of depression likely, however given red flag symptoms above studies requested.  Merla Riches, MD

## 2014-05-19 ENCOUNTER — Other Ambulatory Visit: Payer: Self-pay | Admitting: Family Medicine

## 2014-05-24 ENCOUNTER — Encounter (HOSPITAL_COMMUNITY): Payer: Self-pay

## 2014-05-24 ENCOUNTER — Ambulatory Visit (HOSPITAL_COMMUNITY)
Admission: RE | Admit: 2014-05-24 | Discharge: 2014-05-24 | Disposition: A | Discharge status: Home / Self Care | Payer: Medicare Other | Source: Ambulatory Visit | Attending: Family Medicine | Admitting: Family Medicine

## 2014-05-24 DIAGNOSIS — Z8619 Personal history of other infectious and parasitic diseases: Secondary | ICD-10-CM | POA: Insufficient documentation

## 2014-05-24 DIAGNOSIS — R1084 Generalized abdominal pain: Secondary | ICD-10-CM | POA: Insufficient documentation

## 2014-05-24 MED ORDER — IOHEXOL 300 MG/ML  SOLN
80.0000 mL | Freq: Once | INTRAMUSCULAR | Status: AC | PRN
  Administered 2014-05-24: 80 mL via INTRAVENOUS

## 2014-05-26 ENCOUNTER — Ambulatory Visit (INDEPENDENT_AMBULATORY_CARE_PROVIDER_SITE_OTHER): Payer: Medicare Other | Admitting: Physician Assistant

## 2014-05-26 ENCOUNTER — Encounter: Payer: Self-pay | Admitting: Physician Assistant

## 2014-05-26 VITALS — BP 130/70 | HR 80 | Ht 67.0 in | Wt 154.8 lb

## 2014-05-26 DIAGNOSIS — R634 Abnormal weight loss: Secondary | ICD-10-CM

## 2014-05-26 DIAGNOSIS — R938 Abnormal findings on diagnostic imaging of other specified body structures: Secondary | ICD-10-CM

## 2014-05-26 DIAGNOSIS — R9389 Abnormal findings on diagnostic imaging of other specified body structures: Secondary | ICD-10-CM

## 2014-05-26 DIAGNOSIS — R194 Change in bowel habit: Secondary | ICD-10-CM

## 2014-05-26 DIAGNOSIS — R198 Other specified symptoms and signs involving the digestive system and abdomen: Secondary | ICD-10-CM

## 2014-05-26 DIAGNOSIS — Z8719 Personal history of other diseases of the digestive system: Secondary | ICD-10-CM

## 2014-05-26 NOTE — Patient Instructions (Addendum)
You have been scheduled for a colonoscopy. Please follow written instructions given to you at your visit today.  We have given you a sample colonoscopy prep-Suprep. If you use inhalers (even only as needed), please bring them with you on the day of your procedure. Your physician has requested that you go to www.startemmi.com and enter the access code given to you at your visit today. This web site gives a general overview about your procedure. However, you should still follow specific instructions given to you by our office regarding your preparation for the procedure.   You have been scheduled for an MRI at [facility] on 06-08-2014. Your appointment time is 8:00 am . Please arrive at 7:45 am prior to your appointment time for registration purposes. Please make certain not to have anything to eat or drink 6 hours prior to your test. In addition, if you have any metal in your body, have a pacemaker or defibrillator, please be sure to let your ordering physician know. This test typically takes 45 minutes to 1 hour to complete.

## 2014-05-26 NOTE — Progress Notes (Signed)
Patient ID: [name], male   DOB: 09/13/1955, 59 y.o.   MRN: [id number]   Subjective:    Patient ID: [name], male    DOB: 11/22/1955, 59 y.o.   MRN: [id number]  HPI File is a 59-year-old white male known remotely to Dr. Kaplan. He is referred today by [facility]. [facility] for evaluation of complaints of abdominal pain and weight loss. Patient has history of a chronic pain syndrome with chronic narcotic use prior history of polysubstance abuse which she says is inactive, hypertension sleep apnea bipolar disorder and prior history of hepatitis C which she states he cleared. He had an episode of pancreatitis in May 2015 for which she was hospitalized. He was not seen by GI at that time and etiology of the pancreatitis was not clear though EtOH was suspected. Says that he weighed about 178 pounds at that time and lost 10 pounds during his hospitalization but then since that time his continued to lose weight and is down to 154. He is concerned about his weight loss. He denies any significant abdominal pain at this time he says he has changed his diet dramatically and is eating a vegetarian and low-fat which she seems to tolerate better. Ascending no difficulty with nausea or vomiting no heartburn or indigestion no dysphagia. He has noticed some lower back pain and rectal pressure-type sensation and increasing constipation. He also describes a vague sense of urgency for bowel movements but is unable to have a bowel movement no rectal bleeding or melena. 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 Last colonoscopy 2009 showed sigmoid diverticulosis.  Review of Systems Pertinent positive and negative review of systems were noted in the above HPI section.  All other review of systems was otherwise negative.  Outpatient Encounter Prescriptions as of 05/26/2014  Medication Sig  . ALPRAZolam (XANAX) 0.5 MG tablet Take 0.125 mg by mouth 3 (three) times daily as needed for anxiety.   . aspirin EC 81 MG tablet Take 81 mg by mouth daily.   . Calcium Acetate, Phos Binder, (CALCIUM ACETATE PO) Take by mouth daily.  . cholecalciferol (VITAMIN D) 1000 UNITS tablet Take 2,000 Units by mouth daily.  . Cyanocobalamin (VITAMIN B-12 PO) Take 1 tablet by mouth daily.  . Fiber POWD Take 1 packet by mouth daily as needed (constipation). Mix in 8 oz liquid and drink  . fluticasone (FLONASE) 50 MCG/ACT nasal spray USE 2 SPRAYS IN THE NOSE DAILY.  . gabapentin (NEURONTIN) 400 MG capsule Take 400 capsules by mouth as directed.  . lamoTRIgine (LAMICTAL) 200 MG tablet Take 200 mg by mouth at bedtime.   . lidocaine (LIDODERM) 5 % Place 2 patches onto the skin daily.   . Melatonin 10 MG CAPS Take 10 mg by mouth at bedtime as needed (sleep).   . mirtazapine (REMERON) 45 MG tablet Take 45 mg by mouth at bedtime.  . Multiple Vitamin (MULTIVITAMIN WITH MINERALS) TABS Take 1 tablet by mouth daily.   . oxyCODONE (OXY IR/ROXICODONE) 5 MG immediate release tablet Take   5 mg by mouth 3 (three) times daily.  . POTASSIUM CHLORIDE PO Take by mouth daily.  . simvastatin (ZOCOR) 20 MG tablet TAKE 1 TABLET (20 MG TOTAL) BY MOUTH DAILY AT 6 PM.  . vitamin C (ASCORBIC ACID) 500 MG tablet Take 500 mg by mouth daily.   Allergies  Allergen Reactions  . Morphine And Related Shortness Of Breath   Patient Active Problem List   Diagnosis Date Noted  . Low back pain 02/28/2014  . Acute confusional state 02/28/2014  . Dyspnea on exertion 09/07/2012  . OBSTRUCTIVE SLEEP APNEA 09/20/2009  .  HYPERTENSION, BENIGN 10/19/2008  . RHINITIS 05/13/2007  . HEPATITIS C 06/05/2006  . DEPRESSION, MAJOR, RECURRENT 06/05/2006  . BIPOLAR DISORDER 06/05/2006  . ANXIETY 06/05/2006  . Alcohol abuse, unspecified 06/05/2006  . BACK PAIN, LOW 06/05/2006   History   Social History  . Marital Status: Divorced    Spouse Name: N/A  . Number of Children: 0  . Years of Education: GED   Occupational History  . DISABLED   .      Social History Main Topics  . Smoking status: Former Smoker -- 2.00 packs/day for 16 years    Types: Cigarettes    Quit date: 04/08/2002  . Smokeless tobacco: Never Used  . Alcohol Use: No     Comment: Quit in 1987  . Drug Use: No     Comment: Use to take cocaine, marijuana, per patient everything. Stopped in 1987.  . Sexual Activity:    Partners: Female   Other Topics Concern  . Not on file   Social History Narrative   Patient lives at home alone.    Disabled.   Education GED    Both handed.   Caffeine two cups of coffee daily.          Mr. Woodbeck's family history is not on file.      Objective:    Filed Vitals:   05/26/14 0901  BP: 130/70  Pulse: 80    Physical Exam  well-developed white male in no acute distress blood pressure 130/70 pulse 80 height 5 foot 7 weight 154. HEENT; nontraumatic normocephalic EOMI PERRLA sclera anicteric, Supple ;no JVD, Cardiovascular; regular rate and rhythm with S1-S2 no murmur or gallop, Pulmonary ;clear bilaterally abdomen soft he has no localized tenderness no guarding or rebound no palpable mass or hepatosplenomegaly, bowel sounds are present, Rectal ;exam not done, Extremities; no clubbing cyanosis or edema skin warm and dry, Psych; mood and affect appropriate       Assessment & Plan:   #1 59 yo male with weight loss of about 20 pounds over the past 8 months. Weight loss started when he was admitted with an episode of acute pancreatitis of unclear etiology. He has changed his diet and this may account  for some of the weight loss. Rule out occult malignancy #2 abnormal CT of the pancreas with 1.3 x 1 cm lesion on the ventral surface of the pancreas question complex pseudocyst #3 vague rectal discomfort lower back pain and change in bowel habits with constipation-rule out occult colon lesion #3  history of polysubstance abuse patient states in active #4  bipolar disorder #5 sleep apnea #6 chronic back pain with opioid dependence #7 history of hepatitis C  Plan; He will continue low-fat diet and EtOH avoidance Will schedule for MRI of the abdomen attention to the pancreas to further evaluate the lesion seen on CT CA19-9 Also schedule for colonoscopy with Dr. Kaplan.   Procedure discussed in detail with patient and he is agreeable to proceed  Cc Dr. [facility]   Jamarco Zaldivar S Montravious Weigelt PA-C 05/26/2014  

## 2014-05-27 NOTE — Progress Notes (Signed)
Reviewed and agree with management.  I would add an MRCP to his MRI to help determine possible cause for pancreatitis Sandy Salaam. Deatra Ina, M.D., [employer]

## 2014-05-30 ENCOUNTER — Encounter: Payer: Self-pay | Admitting: Gastroenterology

## 2014-05-30 ENCOUNTER — Ambulatory Visit (AMBULATORY_SURGERY_CENTER): Payer: Medicare Other | Admitting: Gastroenterology

## 2014-05-30 VITALS — BP 116/77 | HR 66 | Temp 97.4°F | Resp 17 | Ht 67.0 in | Wt 154.0 lb

## 2014-05-30 DIAGNOSIS — K5730 Diverticulosis of large intestine without perforation or abscess without bleeding: Secondary | ICD-10-CM

## 2014-05-30 DIAGNOSIS — R634 Abnormal weight loss: Secondary | ICD-10-CM

## 2014-05-30 DIAGNOSIS — K559 Vascular disorder of intestine, unspecified: Secondary | ICD-10-CM

## 2014-05-30 DIAGNOSIS — K5289 Other specified noninfective gastroenteritis and colitis: Secondary | ICD-10-CM

## 2014-05-30 MED ORDER — SODIUM CHLORIDE 0.9 % IV SOLN: 500.0000 mL | INTRAVENOUS | Status: DC

## 2014-05-30 NOTE — Progress Notes (Signed)
Called to room to assist during endoscopic procedure.  Patient ID and intended procedure confirmed with present staff. Received instructions for my participation in the procedure from the performing physician.  

## 2014-05-30 NOTE — Progress Notes (Signed)
Procedure ends, to recovery, report given ans VSS.

## 2014-05-30 NOTE — Patient Instructions (Signed)
Colitis, biopsies taken today. Diverticulosis seen also, handouts given.  Repeat colonoscopy in 10 years.  Call us with any questions or concerns. Thank you!  YOU HAD AN ENDOSCOPIC PROCEDURE TODAY AT [facility]: Refer to the procedure report that was given to you for any specific questions about what was found during the examination.  If the procedure report does not answer your questions, please call your gastroenterologist to clarify.  If you requested that your care partner not be given the details of your procedure findings, then the procedure report has been included in a sealed envelope for you to review at your convenience later.  YOU SHOULD EXPECT: Some feelings of bloating in the abdomen. Passage of more gas than usual.  Walking can help get rid of the air that was put into your GI tract during the procedure and reduce the bloating. If you had a lower endoscopy (such as a colonoscopy or flexible sigmoidoscopy) you may notice spotting of blood in your stool or on the toilet paper. If you underwent a bowel prep for your procedure, then you may not have a normal bowel movement for a few days.  DIET: Your first meal following the procedure should be a light meal and then it is ok to progress to your normal diet.  A half-sandwich or bowl of soup is an example of a good first meal.  Heavy or fried foods are harder to digest and may make you feel nauseous or bloated.  Likewise meals heavy in dairy and vegetables can cause extra gas to form and this can also increase the bloating.  Drink plenty of fluids but you should avoid alcoholic beverages for 24 hours.  ACTIVITY: Your care partner should take you home directly after the procedure.  You should plan to take it easy, moving slowly for the rest of the day.  You can resume normal activity the day after the procedure however you should NOT DRIVE or use heavy machinery for 24 hours (because of the sedation medicines used during the test).     SYMPTOMS TO REPORT IMMEDIATELY: A gastroenterologist can be reached at any hour.  During normal business hours, 8:30 AM to 5:00 PM Monday through Friday, call [phone].  After hours and on weekends, please call the GI answering service at [phone] who will take a message and have the physician on call contact you.   Following lower endoscopy (colonoscopy or flexible sigmoidoscopy):  Excessive amounts of blood in the stool  Significant tenderness or worsening of abdominal pains  Swelling of the abdomen that is new, acute  Fever of 100F or higher  Following upper endoscopy (EGD)  Vomiting of blood or coffee ground material  New chest pain or pain under the shoulder blades  Painful or persistently difficult swallowing  New shortness of breath  Fever of 100F or higher  Black, tarry-looking stools  FOLLOW UP: If any biopsies were taken you will be contacted by phone or by letter within the next 1-3 weeks.  Call your gastroenterologist if you have not heard about the biopsies in 3 weeks.  Our staff will call the home number listed on your records the next business day following your procedure to check on you and address any questions or concerns that you may have at that time regarding the information given to you following your procedure. This is a courtesy call and so if there is no answer at the home number and we have not heard from you  through the emergency physician on call, we will assume that you have returned to your regular daily activities without incident.  SIGNATURES/CONFIDENTIALITY: You and/or your care partner have signed paperwork which will be entered into your electronic medical record.  These signatures attest to the fact that that the information above on your After Visit Summary has been reviewed and is understood.  Full responsibility of the confidentiality of this discharge information lies with you and/or your care-partner.

## 2014-05-30 NOTE — Op Note (Signed)
[facility]  [address] [place], [id number]   COLONOSCOPY PROCEDURE REPORT  PATIENT: [name], [name]  MR#: [id number] BIRTHDATE: 1955-09-16 , 58  yrs. old GENDER: male ENDOSCOPIST: Inda Castle, MD REFERRED SL:HTDSK Ky Barban, M.D. PROCEDURE DATE:  05/30/2014 PROCEDURE:   Colonoscopy with biopsy First Screening Colonoscopy - Avg.  risk and is 50 yrs.  old or older - No.  Prior Negative Screening - Now for repeat screening. 10 or more years since last screening  History of Adenoma - Now for follow-up colonoscopy & has been > or = to 3 yrs.  N/A  Polyps Removed Today? No.  Recommend repeat exam, <10 yrs? No. ASA CLASS:   Class II INDICATIONS:weight loss. MEDICATIONS: Monitored anesthesia care and Propofol 180 mg IV  DESCRIPTION OF PROCEDURE:   After the risks benefits and alternatives of the procedure were thoroughly explained, informed consent was obtained.  The digital rectal exam revealed no abnormalities of the rectum.   The LB [id number]  endoscope was introduced through the anus and advanced to the ileum. No adverse events experienced.   The quality of the prep was excellent using Suprep  The instrument was then slowly withdrawn as the colon was fully examined.      COLON FINDINGS: In the distal descending colon and extending to the proximal sigmoid colon there is indurated, darkly red mucosa that it is slightly erythematous.  Biopsies were taken.   Colitis left colon.   There was mild diverticulosis noted in the transverse colon.   The examination was otherwise normal.  Retroflexed views revealed no abnormalities. The time to cecum=2 minutes 49 seconds. Withdrawal time=7 minutes 44 seconds.  The scope was withdrawn and the procedure completed. COMPLICATIONS: There were no immediate complications.  ENDOSCOPIC IMPRESSION: 1.   In the distal descending colon and extending to the proximal sigmoid colon there is indurated, darkly red mucosa that  it is slightly erythematous.  Biopsies were taken 2.   Colitis left colon 3.   There was mild diverticulosis noted in the transverse colon 4.   The examination was otherwise normal  RECOMMENDATIONS: 1.  Await biopsy results 2.  Colonoscopy 10 years  eSigned:  Inda Castle, MD 05/30/2014 2:07 PM   cc:   PATIENT NAME:  [name], [name] MR#: [id number]

## 2014-05-31 ENCOUNTER — Telehealth: Payer: Self-pay | Admitting: *Deleted

## 2014-05-31 NOTE — Telephone Encounter (Signed)
  Follow up Call-  Call back number 05/30/2014  Post procedure Call Back phone  # [phone]  Permission to leave phone message Yes   name on ID didn't match pt's name; no message left

## 2014-06-07 ENCOUNTER — Other Ambulatory Visit: Payer: Self-pay

## 2014-06-07 ENCOUNTER — Other Ambulatory Visit: Payer: Self-pay | Admitting: Physician Assistant

## 2014-06-07 ENCOUNTER — Encounter: Payer: Self-pay | Admitting: Gastroenterology

## 2014-06-07 DIAGNOSIS — R634 Abnormal weight loss: Secondary | ICD-10-CM

## 2014-06-07 DIAGNOSIS — R194 Change in bowel habit: Secondary | ICD-10-CM

## 2014-06-07 DIAGNOSIS — R9389 Abnormal findings on diagnostic imaging of other specified body structures: Secondary | ICD-10-CM

## 2014-06-07 DIAGNOSIS — K859 Acute pancreatitis, unspecified: Secondary | ICD-10-CM

## 2014-06-07 DIAGNOSIS — R935 Abnormal findings on diagnostic imaging of other abdominal regions, including retroperitoneum: Secondary | ICD-10-CM

## 2014-06-07 DIAGNOSIS — R198 Other specified symptoms and signs involving the digestive system and abdomen: Secondary | ICD-10-CM

## 2014-06-07 DIAGNOSIS — Z8719 Personal history of other diseases of the digestive system: Secondary | ICD-10-CM

## 2014-06-08 ENCOUNTER — Ambulatory Visit (HOSPITAL_COMMUNITY)
Admission: RE | Admit: 2014-06-08 | Discharge: 2014-06-08 | Disposition: A | Discharge status: Home / Self Care | Payer: Medicare Other | Source: Ambulatory Visit | Attending: Physician Assistant | Admitting: Physician Assistant

## 2014-06-08 ENCOUNTER — Ambulatory Visit (HOSPITAL_COMMUNITY): Payer: Medicare Other

## 2014-06-08 DIAGNOSIS — R634 Abnormal weight loss: Secondary | ICD-10-CM

## 2014-06-08 DIAGNOSIS — K868 Other specified diseases of pancreas: Secondary | ICD-10-CM | POA: Diagnosis not present

## 2014-06-08 DIAGNOSIS — R9389 Abnormal findings on diagnostic imaging of other specified body structures: Secondary | ICD-10-CM

## 2014-06-08 DIAGNOSIS — R194 Change in bowel habit: Secondary | ICD-10-CM

## 2014-06-08 DIAGNOSIS — R198 Other specified symptoms and signs involving the digestive system and abdomen: Secondary | ICD-10-CM

## 2014-06-08 DIAGNOSIS — B1920 Unspecified viral hepatitis C without hepatic coma: Secondary | ICD-10-CM | POA: Diagnosis not present

## 2014-06-08 DIAGNOSIS — Z8719 Personal history of other diseases of the digestive system: Secondary | ICD-10-CM

## 2014-06-08 MED ORDER — GADOBENATE DIMEGLUMINE 529 MG/ML IV SOLN
14.0000 mL | Freq: Once | INTRAVENOUS | Status: AC | PRN
  Administered 2014-06-08: 14 mL via INTRAVENOUS

## 2014-06-09 ENCOUNTER — Telehealth: Payer: Self-pay | Admitting: Gastroenterology

## 2014-06-10 NOTE — Telephone Encounter (Signed)
Advised his MRI is being reviewed by Dr Deatra Ina. Patient says he still doesn't feel great, but he is not worse.

## 2014-06-16 ENCOUNTER — Other Ambulatory Visit: Payer: Self-pay

## 2014-06-16 DIAGNOSIS — K8689 Other specified diseases of pancreas: Secondary | ICD-10-CM

## 2014-06-17 ENCOUNTER — Encounter (HOSPITAL_COMMUNITY): Payer: Self-pay | Admitting: *Deleted

## 2014-06-17 ENCOUNTER — Telehealth: Payer: Self-pay

## 2014-06-17 NOTE — Telephone Encounter (Signed)
[name],  Sorry, I had presumed that he was aware of the MRI findings and the decision to go ahead with EUS before asking that he be put on my schedule for next week.  I spoke with him this afternoon, he understands about MRI, persistent lesion near pancreas and agrees to go ahead with EUS. Can you please contact him to make sure he is aware of the EUS, knows where to be and when to be there.  Thanks  DJ

## 2014-06-17 NOTE — Telephone Encounter (Signed)
Spoke with the patient. He wants to talk with family members about what he will do. He is asking if [facility] would get him quicker than the Thursday appointment. He will call me back with his decision.

## 2014-06-17 NOTE — Telephone Encounter (Signed)
Patient does not know why he is having the EUS. He has not been notified of the MRI. Neither of the other providers are here today. Please advise on explaining the MRI to this nice man.

## 2014-06-20 NOTE — Telephone Encounter (Signed)
I called pt and went over everything with him again today to be sure he had good understanding of why the EUS is being done- he is fine and agreeable to proceed

## 2014-06-20 NOTE — Telephone Encounter (Signed)
[name] left me a message on voice that he will stay with the present plan of care. He verbalizes he is very scared. Called to him to confirm Thursday's appointment and to be clear he will need to fast. Had to leave a message. Also encouraged him in the message to call me back with any questions or concerns.

## 2014-06-21 ENCOUNTER — Telehealth: Payer: Self-pay | Admitting: Gastroenterology

## 2014-06-21 NOTE — Telephone Encounter (Signed)
Pt was seen at pain clinic today for his back pain and was given Fentanyl patch 66mcg. Pt wanted to know if it was ok for him to use these. Discussed with pt that it was fine for him to use the patch.

## 2014-06-23 ENCOUNTER — Encounter (HOSPITAL_COMMUNITY): Payer: Self-pay | Admitting: Anesthesiology

## 2014-06-23 ENCOUNTER — Ambulatory Visit (HOSPITAL_COMMUNITY)
Admission: RE | Admit: 2014-06-23 | Discharge: 2014-06-23 | Disposition: A | Discharge status: Home / Self Care | Payer: Medicare Other | Source: Ambulatory Visit | Attending: Gastroenterology | Admitting: Gastroenterology

## 2014-06-23 ENCOUNTER — Ambulatory Visit (HOSPITAL_COMMUNITY): Payer: Medicare Other | Admitting: Anesthesiology

## 2014-06-23 ENCOUNTER — Encounter (HOSPITAL_COMMUNITY)
Admission: RE | Disposition: A | Discharge status: Home / Self Care | Payer: Self-pay | Source: Ambulatory Visit | Attending: Gastroenterology

## 2014-06-23 DIAGNOSIS — F419 Anxiety disorder, unspecified: Secondary | ICD-10-CM | POA: Diagnosis not present

## 2014-06-23 DIAGNOSIS — Z888 Allergy status to other drugs, medicaments and biological substances status: Secondary | ICD-10-CM | POA: Insufficient documentation

## 2014-06-23 DIAGNOSIS — F329 Major depressive disorder, single episode, unspecified: Secondary | ICD-10-CM | POA: Diagnosis not present

## 2014-06-23 DIAGNOSIS — G4733 Obstructive sleep apnea (adult) (pediatric): Secondary | ICD-10-CM | POA: Insufficient documentation

## 2014-06-23 DIAGNOSIS — Z79899 Other long term (current) drug therapy: Secondary | ICD-10-CM | POA: Insufficient documentation

## 2014-06-23 DIAGNOSIS — I10 Essential (primary) hypertension: Secondary | ICD-10-CM | POA: Insufficient documentation

## 2014-06-23 DIAGNOSIS — K8689 Other specified diseases of pancreas: Secondary | ICD-10-CM

## 2014-06-23 DIAGNOSIS — Z87891 Personal history of nicotine dependence: Secondary | ICD-10-CM | POA: Insufficient documentation

## 2014-06-23 DIAGNOSIS — Z7982 Long term (current) use of aspirin: Secondary | ICD-10-CM | POA: Diagnosis not present

## 2014-06-23 DIAGNOSIS — K869 Disease of pancreas, unspecified: Secondary | ICD-10-CM

## 2014-06-23 HISTORY — DX: Sleep apnea, unspecified: G47.30

## 2014-06-23 HISTORY — DX: Other psychoactive substance use, unspecified, in remission: F19.91

## 2014-06-23 HISTORY — DX: Personal history of other specified conditions: Z87.898

## 2014-06-23 HISTORY — PX: EUS: SHX5427

## 2014-06-23 SURGERY — UPPER ENDOSCOPIC ULTRASOUND (EUS) LINEAR
Anesthesia: Monitor Anesthesia Care

## 2014-06-23 MED ORDER — LACTATED RINGERS IV SOLN
INTRAVENOUS | Status: DC | PRN
  Administered 2014-06-23: 11:00:00 via INTRAVENOUS

## 2014-06-23 MED ORDER — SODIUM CHLORIDE 0.9 % IV SOLN: INTRAVENOUS | Status: DC

## 2014-06-23 MED ORDER — PROPOFOL 10 MG/ML IV BOLUS
INTRAVENOUS | Status: AC
  Filled 2014-06-23: qty 20

## 2014-06-23 MED ORDER — PROPOFOL 10 MG/ML IV BOLUS
INTRAVENOUS | Status: DC | PRN
  Administered 2014-06-23: 20 mg via INTRAVENOUS
  Administered 2014-06-23: 50 mg via INTRAVENOUS
  Administered 2014-06-23: 20 mg via INTRAVENOUS

## 2014-06-23 MED ORDER — PROPOFOL INFUSION 10 MG/ML OPTIME
INTRAVENOUS | Status: DC | PRN
  Administered 2014-06-23: 100 ug/kg/min via INTRAVENOUS

## 2014-06-23 NOTE — Discharge Instructions (Signed)

## 2014-06-23 NOTE — H&P (View-Only) (Signed)
Patient ID: [name], male   DOB: October 30, 1955, 59 y.o.   MRN: [id number]   Subjective:    Patient ID: [name], male    DOB: 1955/09/28, 59 y.o.   MRN: [id number]  HPI File is a 59 year old white male known remotely to Dr. Deatra Ina. He is referred today by Dr. [facility] Ree [facility] for evaluation of complaints of abdominal pain and weight loss. Patient has history of a chronic pain syndrome with chronic narcotic use prior history of polysubstance abuse which she says is inactive, hypertension sleep apnea bipolar disorder and prior history of hepatitis C which she states he cleared. He had an episode of pancreatitis in May 2015 for which she was hospitalized. He was not seen by GI at that time and etiology of the pancreatitis was not clear though EtOH was suspected. Says that he weighed about 178 pounds at that time and lost 10 pounds during his hospitalization but then since that time his continued to lose weight and is down to 154. He is concerned about his weight loss. He denies any significant abdominal pain at this time he says he has changed his diet dramatically and is eating a vegetarian and low-fat which she seems to tolerate better. Ascending no difficulty with nausea or vomiting no heartburn or indigestion no dysphagia. He has noticed some lower back pain and rectal pressure-type sensation and increasing constipation. He also describes a vague sense of urgency for bowel movements but is unable to have a bowel movement no rectal bleeding or melena. 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 Last colonoscopy 2009 showed sigmoid diverticulosis.  Review of Systems Pertinent positive and negative review of systems were noted in the above HPI section.  All other review of systems was otherwise negative.  Outpatient Encounter Prescriptions as of 05/26/2014  Medication Sig  . ALPRAZolam (XANAX) 0.5 MG tablet Take 0.125 mg by mouth 3 (three) times daily as needed for anxiety.   Marland Kitchen aspirin EC 81 MG tablet Take 81 mg by mouth daily.   . Calcium Acetate, Phos Binder, (CALCIUM ACETATE PO) Take by mouth daily.  . cholecalciferol (VITAMIN D) 1000 UNITS tablet Take 2,000 Units by mouth daily.  . Cyanocobalamin (VITAMIN B-12 PO) Take 1 tablet by mouth daily.  . Fiber POWD Take 1 packet by mouth daily as needed (constipation). Mix in 8 oz liquid and drink  . fluticasone (FLONASE) 50 MCG/ACT nasal spray USE 2 SPRAYS IN THE NOSE DAILY.  Marland Kitchen gabapentin (NEURONTIN) 400 MG capsule Take 400 capsules by mouth as directed.  . lamoTRIgine (LAMICTAL) 200 MG tablet Take 200 mg by mouth at bedtime.   . lidocaine (LIDODERM) 5 % Place 2 patches onto the skin daily.   . Melatonin 10 MG CAPS Take 10 mg by mouth at bedtime as needed (sleep).   . mirtazapine (REMERON) 45 MG tablet Take 45 mg by mouth at bedtime.  . Multiple Vitamin (MULTIVITAMIN WITH MINERALS) TABS Take 1 tablet by mouth daily.   Marland Kitchen oxyCODONE (OXY IR/ROXICODONE) 5 MG immediate release tablet Take  5 mg by mouth 3 (three) times daily.  Marland Kitchen POTASSIUM CHLORIDE PO Take by mouth daily.  . simvastatin (ZOCOR) 20 MG tablet TAKE 1 TABLET (20 MG TOTAL) BY MOUTH DAILY AT 6 PM.  . vitamin C (ASCORBIC ACID) 500 MG tablet Take 500 mg by mouth daily.   Allergies  Allergen Reactions  . Morphine And Related Shortness Of Breath   Patient Active Problem List   Diagnosis Date Noted  . Low back pain 02/28/2014  . Acute confusional state 02/28/2014  . Dyspnea on exertion 09/07/2012  . OBSTRUCTIVE SLEEP APNEA 09/20/2009  .  HYPERTENSION, BENIGN 10/19/2008  . RHINITIS 05/13/2007  . HEPATITIS C 06/05/2006  . DEPRESSION, MAJOR, RECURRENT 06/05/2006  . BIPOLAR DISORDER 06/05/2006  . ANXIETY 06/05/2006  . Alcohol abuse, unspecified 06/05/2006  . BACK PAIN, LOW 06/05/2006   History   Social History  . Marital Status: Divorced    Spouse Name: N/A  . Number of Children: 0  . Years of Education: GED   Occupational History  . DISABLED   .      Social History Main Topics  . Smoking status: Former Smoker -- 2.00 packs/day for 16 years    Types: Cigarettes    Quit date: 04/08/2002  . Smokeless tobacco: Never Used  . Alcohol Use: No     Comment: Quit in 1987  . Drug Use: No     Comment: Use to take cocaine, marijuana, per patient everything. Stopped in 1987.  Marland Kitchen Sexual Activity:    Partners: Female   Other Topics Concern  . Not on file   Social History Narrative   Patient lives at home alone.    Disabled.   Education GED    Both handed.   Caffeine two cups of coffee daily.          Mr. Hallum family history is not on file.      Objective:    Filed Vitals:   05/26/14 0901  BP: 130/70  Pulse: 80    Physical Exam  well-developed white male in no acute distress blood pressure 130/70 pulse 80 height 5 foot 7 weight 154. HEENT; nontraumatic normocephalic EOMI PERRLA sclera anicteric, Supple ;no JVD, Cardiovascular; regular rate and rhythm with S1-S2 no murmur or gallop, Pulmonary ;clear bilaterally abdomen soft he has no localized tenderness no guarding or rebound no palpable mass or hepatosplenomegaly, bowel sounds are present, Rectal ;exam not done, Extremities; no clubbing cyanosis or edema skin warm and dry, Psych; mood and affect appropriate       Assessment & Plan:   #1 59 yo male with weight loss of about 20 pounds over the past 8 months. Weight loss started when he was admitted with an episode of acute pancreatitis of unclear etiology. He has changed his diet and this may account  for some of the weight loss. Rule out occult malignancy #2 abnormal CT of the pancreas with 1.3 x 1 cm lesion on the ventral surface of the pancreas question complex pseudocyst #3 vague rectal discomfort lower back pain and change in bowel habits with constipation-rule out occult colon lesion #3  history of polysubstance abuse patient states in active #4  bipolar disorder #5 sleep apnea #6 chronic back pain with opioid dependence #7 history of hepatitis C  Plan; He will continue low-fat diet and EtOH avoidance Will schedule for MRI of the abdomen attention to the pancreas to further evaluate the lesion seen on CT CA19-9 Also schedule for colonoscopy with Dr. Deatra Ina.  Procedure discussed in detail with patient and he is agreeable to proceed  Cc Dr. Deniece Ree family medicine   [place] Alexa Genia Harold PA-C 05/26/2014

## 2014-06-23 NOTE — Transfer of Care (Signed)
Immediate Anesthesia Transfer of Care Note  Patient: [name]  Procedure(s) Performed: Procedure(s) (LRB): UPPER ENDOSCOPIC ULTRASOUND (EUS) LINEAR (N/A)  Patient Location: PACU  Anesthesia Type: MAC  Level of Consciousness: sedated, patient cooperative and responds to stimulation  Airway & Oxygen Therapy: Patient Spontanous Breathing and Patient connected to face mask oxgen  Post-op Assessment: Report given to PACU RN and Post -op Vital signs reviewed and stable  Post vital signs: Reviewed and stable  Complications: No apparent anesthesia complications

## 2014-06-23 NOTE — Anesthesia Preprocedure Evaluation (Signed)
Anesthesia Evaluation    Airway Mallampati: II  TM Distance: >3 FB Neck ROM: Full    Dental no notable dental hx.    Pulmonary sleep apnea , former smoker,  breath sounds clear to auscultation  Pulmonary exam normal       Cardiovascular hypertension, Pt. on medications Rhythm:Regular Rate:Normal     Neuro/Psych    GI/Hepatic (+) Hepatitis -, C  Endo/Other    Renal/GU      Musculoskeletal Chronic pain and chronic opioids   Abdominal   Peds  Hematology   Anesthesia Other Findings   Reproductive/Obstetrics                             Anesthesia Physical Anesthesia Plan  ASA: III  Anesthesia Plan: MAC   Post-op Pain Management:    Induction:   Airway Management Planned:   Additional Equipment:   Intra-op Plan:   Post-operative Plan:   Informed Consent: I have reviewed the patients History and Physical, chart, labs and discussed the procedure including the risks, benefits and alternatives for the proposed anesthesia with the patient or authorized representative who has indicated his/her understanding and acceptance.   Dental advisory given  Plan Discussed with: CRNA  Anesthesia Plan Comments:         Anesthesia Quick Evaluation

## 2014-06-23 NOTE — Op Note (Signed)
[facility] [place], [address]   ENDOSCOPIC ULTRASOUND PROCEDURE REPORT  PATIENT: [name], [name]  MR#: [id number] BIRTHDATE: 04-24-1955  GENDER: male ENDOSCOPIST: Milus Banister, MD REFERRED BY:  Inda Castle, M.D. PROCEDURE DATE:  06/23/2014 PROCEDURE:   Upper EUS w/FNA ASA CLASS:      Class III INDICATIONS:   1.  abdominal discomfort, weight loss, lesion in or near pancreas on CT and MRI. MEDICATIONS: Monitored anesthesia care  DESCRIPTION OF PROCEDURE:   After the risks benefits and alternatives of the procedure were  explained, informed consent was obtained. The patient was then placed in the left, lateral, decubitus postion and IV sedation was administered. Throughout the procedure, the patients blood pressure, pulse and oxygen saturations were monitored continuously.  Under direct visualization, the Pentax Radial EUS [id number]  endoscope was introduced through the mouth  and advanced to the second portion of the duodenum .  Water was used as necessary to provide an acoustic interface.  Upon completion of the imaging, water was removed and the patient was sent to the recovery room in satisfactory condition.  Endoscopic findings: 1. Normal UGI tract.  EUS findings: 1. Subtle, hypoechoic soft tissue lesion located in (or directly adjacent to) the body of pancreas, directly abutting the portal vein/SMV confluence.  The lesion is 26mm across, was sampled with three transgastric passes with a 25 gauge EUS FNA needle, suction. 2. Pancreatic parenchyma was otherwise normal throughout the gland. 3. Main pancreatic duct was normal; non-dilated. 4. CBD was normal 5. Gallbladder was normal 6. Limited views of liver, spleen, portal and splenic vessels were all normal.  ENDOSCOPIC IMPRESSION: Subtle soft tissue measuring 1.4cm, located in or directly adjacent to the body of pancreas.  Preliminary cytology review shows no sign of malignancy.   Await final report.  RECOMMENDATIONS: Await final pathology results. Will communicate these findings with Dr. Deatra Ina.   _______________________________ eSigned:  Milus Banister, MD 06/23/2014 11:51 AM

## 2014-06-23 NOTE — Anesthesia Postprocedure Evaluation (Signed)
  Anesthesia Post-op Note  Patient: [name]  Procedure(s) Performed: Procedure(s) (LRB): UPPER ENDOSCOPIC ULTRASOUND (EUS) LINEAR (N/A)  Patient Location: PACU  Anesthesia Type: MAC  Level of Consciousness: awake and alert   Airway and Oxygen Therapy: Patient Spontanous Breathing  Post-op Pain: mild  Post-op Assessment: Post-op Vital signs reviewed, Patient's Cardiovascular Status Stable, Respiratory Function Stable, Patent Airway and No signs of Nausea or vomiting  Last Vitals:  Filed Vitals:   06/23/14 1151  BP: 94/75  Pulse: 67  Temp: 37 C  Resp: 16    Post-op Vital Signs: stable   Complications: No apparent anesthesia complications

## 2014-06-23 NOTE — Interval H&P Note (Signed)
History and Physical Interval Note:  06/23/2014 10:25 AM  [name]  has presented today for surgery, with the diagnosis of pancraetic mass  The various methods of treatment have been discussed with the patient and family. After consideration of risks, benefits and other options for treatment, the patient has consented to  Procedure(s): UPPER ENDOSCOPIC ULTRASOUND (EUS) LINEAR (N/A) as a surgical intervention .  The patient's history has been reviewed, patient examined, no change in status, stable for surgery.  I have reviewed the patient's chart and labs.  Questions were answered to the patient's satisfaction.     Milus Banister

## 2014-06-24 ENCOUNTER — Encounter (HOSPITAL_COMMUNITY): Payer: Self-pay | Admitting: Gastroenterology

## 2014-07-04 ENCOUNTER — Other Ambulatory Visit: Payer: Self-pay

## 2014-07-05 ENCOUNTER — Other Ambulatory Visit: Payer: Self-pay

## 2014-07-05 DIAGNOSIS — K862 Cyst of pancreas: Secondary | ICD-10-CM

## 2014-07-15 ENCOUNTER — Encounter (HOSPITAL_COMMUNITY): Payer: Self-pay | Admitting: Emergency Medicine

## 2014-07-15 ENCOUNTER — Emergency Department (HOSPITAL_COMMUNITY): Payer: Medicare Other

## 2014-07-15 ENCOUNTER — Emergency Department (INDEPENDENT_AMBULATORY_CARE_PROVIDER_SITE_OTHER)
Admission: EM | Admit: 2014-07-15 | Discharge: 2014-07-15 | Disposition: A | Discharge status: Nursing Home (Includes ICF) | Payer: Medicare Other | Source: Home / Self Care | Attending: Family Medicine | Admitting: Family Medicine

## 2014-07-15 ENCOUNTER — Emergency Department (HOSPITAL_COMMUNITY)
Admission: EM | Admit: 2014-07-15 | Discharge: 2014-07-16 | Disposition: A | Discharge status: Home / Self Care | Payer: Medicare Other | Attending: Emergency Medicine | Admitting: Emergency Medicine

## 2014-07-15 DIAGNOSIS — Z7982 Long term (current) use of aspirin: Secondary | ICD-10-CM | POA: Insufficient documentation

## 2014-07-15 DIAGNOSIS — R5383 Other fatigue: Secondary | ICD-10-CM | POA: Diagnosis not present

## 2014-07-15 DIAGNOSIS — I10 Essential (primary) hypertension: Secondary | ICD-10-CM | POA: Insufficient documentation

## 2014-07-15 DIAGNOSIS — R410 Disorientation, unspecified: Secondary | ICD-10-CM | POA: Diagnosis not present

## 2014-07-15 DIAGNOSIS — R0602 Shortness of breath: Secondary | ICD-10-CM

## 2014-07-15 DIAGNOSIS — R0789 Other chest pain: Secondary | ICD-10-CM

## 2014-07-15 DIAGNOSIS — R531 Weakness: Secondary | ICD-10-CM

## 2014-07-15 DIAGNOSIS — G8929 Other chronic pain: Secondary | ICD-10-CM | POA: Insufficient documentation

## 2014-07-15 DIAGNOSIS — R079 Chest pain, unspecified: Secondary | ICD-10-CM | POA: Diagnosis not present

## 2014-07-15 DIAGNOSIS — Z79899 Other long term (current) drug therapy: Secondary | ICD-10-CM | POA: Diagnosis not present

## 2014-07-15 DIAGNOSIS — Z87891 Personal history of nicotine dependence: Secondary | ICD-10-CM | POA: Insufficient documentation

## 2014-07-15 DIAGNOSIS — Z8619 Personal history of other infectious and parasitic diseases: Secondary | ICD-10-CM | POA: Diagnosis not present

## 2014-07-15 LAB — COMPREHENSIVE METABOLIC PANEL
ALT: 22 U/L (ref 0–53)
AST: 22 U/L (ref 0–37)
Albumin: 4.6 g/dL (ref 3.5–5.2)
Alkaline Phosphatase: 59 U/L (ref 39–117)
Anion gap: 6 (ref 5–15)
BUN: 7 mg/dL (ref 6–23)
CO2: 27 mmol/L (ref 19–32)
Calcium: 9.9 mg/dL (ref 8.4–10.5)
Chloride: 109 mmol/L (ref 96–112)
Creatinine, Ser: 0.97 mg/dL (ref 0.50–1.35)
GFR calc Af Amer: >90 mL/min (ref >90)
GFR calc non Af Amer: 90 mL/min — ABNORMAL LOW (ref >90)
Glucose, Bld: 100 mg/dL — ABNORMAL HIGH (ref 70–99)
Potassium: 4.1 mmol/L (ref 3.5–5.1)
Sodium: 142 mmol/L (ref 135–145)
Total Bilirubin: 0.9 mg/dL (ref 0.3–1.2)
Total Protein: 7.9 g/dL (ref 6.0–8.3)

## 2014-07-15 LAB — URINALYSIS, ROUTINE W REFLEX MICROSCOPIC
Bilirubin Urine: NEGATIVE
Glucose, UA: NEGATIVE mg/dL
Hgb urine dipstick: NEGATIVE
Ketones, ur: NEGATIVE mg/dL
Leukocytes, UA: NEGATIVE
Nitrite: NEGATIVE
Protein, ur: NEGATIVE mg/dL
Specific Gravity, Urine: 1.009 (ref 1.005–1.030)
Urobilinogen, UA: 0.2 mg/dL (ref 0.0–1.0)
pH: 5.5 (ref 5.0–8.0)

## 2014-07-15 LAB — CBC WITH DIFFERENTIAL/PLATELET
Basophils Absolute: 0 10*3/uL (ref 0.0–0.1)
Basophils Relative: 0 % (ref 0–1)
Eosinophils Absolute: 0.1 10*3/uL (ref 0.0–0.7)
Eosinophils Relative: 1 % (ref 0–5)
HCT: 41.8 % (ref 39.0–52.0)
Hemoglobin: 14.1 g/dL (ref 13.0–17.0)
Lymphocytes Relative: 21 % (ref 12–46)
Lymphs Abs: 2.2 10*3/uL (ref 0.7–4.0)
MCH: 26 pg (ref 26.0–34.0)
MCHC: 33.7 g/dL (ref 30.0–36.0)
MCV: 77.1 fL — ABNORMAL LOW (ref 78.0–100.0)
Monocytes Absolute: 0.8 10*3/uL (ref 0.1–1.0)
Monocytes Relative: 8 % (ref 3–12)
Neutro Abs: 7.3 10*3/uL (ref 1.7–7.7)
Neutrophils Relative %: 70 % (ref 43–77)
Platelets: 272 10*3/uL (ref 150–400)
RBC: 5.42 MIL/uL (ref 4.22–5.81)
RDW: 14.2 % (ref 11.5–15.5)
WBC: 10.3 10*3/uL (ref 4.0–10.5)

## 2014-07-15 LAB — I-STAT TROPONIN, ED: Troponin i, poc: 0 ng/mL (ref 0.00–0.08)

## 2014-07-15 LAB — AMMONIA: Ammonia: 46 umol/L — ABNORMAL HIGH (ref 11–32)

## 2014-07-15 LAB — LIPASE, BLOOD: Lipase: 29 U/L (ref 11–59)

## 2014-07-15 NOTE — ED Notes (Signed)
The patient said he has had diarrhea for some days.  He says he has pancreatitis and he says his stomach had not been right since he was diagnosed with this.  The patient said he has also been "disoriented" since Sunday where he could not remember the names of  people that he knows.  He says this has happened to him before but it resolves.  He denies pain, N/V or any other symptoms.

## 2014-07-15 NOTE — Discharge Instructions (Signed)

## 2014-07-15 NOTE — ED Notes (Signed)
Pt transported to CT ?

## 2014-07-15 NOTE — ED Provider Notes (Signed)
CSN: [id number]     Arrival date & time 07/15/14  1824 History   First MD Initiated Contact with Patient 07/15/14 1943     Chief Complaint  Patient presents with  . Weakness   (Consider location/radiation/quality/duration/timing/severity/associated sxs/prior Treatment) HPI         59 year old male presents for evaluation and shortness of breath. This started approximately one week ago. He had an episode of substernal chest pain that lasted for "a while,", he is unable to tell me exactly how long this lasted. The next day he woke up feeling very weak. This feeling has persisted and has gotten slightly worse. The past few days he has started to develop shortness of breath that has been getting much worse over the past day. He describes this as feeling like he cannot talk or if you're not having to stop and breathe hard. He has never had this happen before. No NVD or leg swelling.  Past Medical History  Diagnosis Date  . Hypertension   . Bipolar disorder   . Allergy   . Depression   . Chronic pain     lower back pain, "that has progressed"  . Panic attack   . Dyspnea   . Sleep apnea     no cpap used,still has machine(use rarely)  . Hepatitis C     hx. of"states he no longer has"" is clear".  . History of recreational drug use     "cocaine, marijuana, polysubstance" quit 1987.   Past Surgical History  Procedure Laterality Date  . Tonsillectomy    . Cardiac catheterization  2003    No CAD  . Sinus surgery    . Eus N/A 06/23/2014    Procedure: UPPER ENDOSCOPIC ULTRASOUND (EUS) LINEAR;  Surgeon: Milus Banister, MD;  Location: [facility];  Service: Endoscopy;  Laterality: N/A;   Family History  Problem Relation Age of Onset  . Heart disease Mother   . Kidney disease Mother   . Heart disease Father   . Breast cancer Maternal Grandmother   . Diabetes Maternal Grandfather    History  Substance Use Topics  . Smoking status: Former Smoker -- 2.00 packs/day for 16 years    Types:  Cigarettes    Quit date: 04/08/2002  . Smokeless tobacco: Never Used  . Alcohol Use: No     Comment: Quit in 1987    Review of Systems  Respiratory: Positive for shortness of breath.   Cardiovascular: Positive for chest pain.  Neurological: Positive for weakness.  All other systems reviewed and are negative.   Allergies  Morphine and related  Home Medications   Prior to Admission medications   Medication Sig Start Date End Date Taking? Authorizing Provider  lamoTRIgine (LAMICTAL) 200 MG tablet Take 200 mg by mouth at bedtime.  07/30/13  Yes Historical Provider, MD  oxyCODONE (OXY IR/ROXICODONE) 5 MG immediate release tablet Take 5 mg by mouth every 6 (six) hours as needed for moderate pain.    Yes Historical Provider, MD  simvastatin (ZOCOR) 20 MG tablet TAKE 1 TABLET (20 MG TOTAL) BY MOUTH DAILY AT 6 PM. 05/19/14  Yes Olam Idler, MD  ALPRAZolam Duanne Moron) 1 MG tablet Take 0.5-1 mg by mouth 3 (three) times daily as needed for anxiety.  05/19/14   Historical Provider, MD  aspirin EC 81 MG tablet Take 81 mg by mouth daily.     Historical Provider, MD  Calcium Acetate, Phos Binder, (CALCIUM ACETATE PO) Take 1 tablet by mouth  daily.     Historical Provider, MD  cholecalciferol (VITAMIN D) 1000 UNITS tablet Take 2,000 Units by mouth daily.    Historical Provider, MD  Cyanocobalamin (VITAMIN B-12 PO) Take 1 tablet by mouth daily.    Historical Provider, MD  fentaNYL (DURAGESIC - DOSED MCG/HR) 25 MCG/HR patch Place 25 mcg onto the skin every 3 (three) days.    Historical Provider, MD  Fiber POWD Take 1 packet by mouth daily as needed (constipation). Mix in 8 oz liquid and drink    Historical Provider, MD  mirtazapine (REMERON) 45 MG tablet Take 45 mg by mouth at bedtime.    Historical Provider, MD  Multiple Vitamin (MULTIVITAMIN WITH MINERALS) TABS Take 1 tablet by mouth daily.     Historical Provider, MD  POTASSIUM CHLORIDE PO Take by mouth daily.    Historical Provider, MD  vitamin C  (ASCORBIC ACID) 500 MG tablet Take 500 mg by mouth daily.    Historical Provider, MD   BP 131/91 mmHg  Pulse 85  Temp(Src) 98.6 F (37 C) (Oral)  Resp 20  SpO2 99% Physical Exam  Constitutional: He is oriented to person, place, and time. He appears well-developed and well-nourished. No distress.  HENT:  Head: Normocephalic.  Neck: Normal range of motion. Neck supple.  Cardiovascular: Normal rate, regular rhythm, normal heart sounds and intact distal pulses.   No peripheral edema  Pulmonary/Chest: Effort normal and breath sounds normal. No respiratory distress.  Lymphadenopathy:    He has no cervical adenopathy.  Neurological: He is alert and oriented to person, place, and time. No cranial nerve deficit or sensory deficit. He exhibits normal muscle tone. Coordination and gait normal. GCS eye subscore is 4. GCS verbal subscore is 5. GCS motor subscore is 6.  Positive Romberg Normal mental status  Skin: Skin is warm and dry. No rash noted. He is not diaphoretic.  Psychiatric: He has a normal mood and affect. Judgment normal.  Nursing note and vitals reviewed.   ED Course  Procedures (including critical care time) Labs Review Labs Reviewed - No data to display  Imaging Review No results found.   MDM   1. Weakness   2. SOB (shortness of breath)   3. Chest pain, atypical    Story of chest pain followed by increasing weakness and shortness of breath is concerning for having had a coronary event.  Transfer to ED for eval     Liam Graham, PA-C 07/15/14 1947

## 2014-07-15 NOTE — ED Notes (Signed)
NP at bedside.

## 2014-07-15 NOTE — ED Provider Notes (Signed)
CSN: [id number]     Arrival date & time 07/15/14  2009 History   First MD Initiated Contact with Patient 07/15/14 2114     Chief Complaint  Patient presents with  . Fatigue    The patient said he has had diarrhea for some days.  He says he has pancreatitis and he says his stomach had not been right since he was diagnosed with this.       (Consider location/radiation/quality/duration/timing/severity/associated sxs/prior Treatment) HPI Comments: Patient sent from urgent care with concern for potential coronary event due to weakness and shortness of breath.  Patient is a 59 y.o. male presenting with weakness. The history is provided by the patient and medical records. No language interpreter was used.  Weakness This is a new problem. The current episode started in the past 7 days. The problem occurs constantly. The problem has been gradually worsening. Associated symptoms include chest pain, fatigue and weakness. Associated symptoms comments: Exertional dyspnea.    Past Medical History  Diagnosis Date  . Hypertension   . Bipolar disorder   . Allergy   . Depression   . Chronic pain     lower back pain, "that has progressed"  . Panic attack   . Dyspnea   . Sleep apnea     no cpap used,still has machine(use rarely)  . Hepatitis C     hx. of"states he no longer has"" is clear".  . History of recreational drug use     "cocaine, marijuana, polysubstance" quit 1987.   Past Surgical History  Procedure Laterality Date  . Tonsillectomy    . Cardiac catheterization  2003    No CAD  . Sinus surgery    . Eus N/A 06/23/2014    Procedure: UPPER ENDOSCOPIC ULTRASOUND (EUS) LINEAR;  Surgeon: Milus Banister, MD;  Location: [facility];  Service: Endoscopy;  Laterality: N/A;   Family History  Problem Relation Age of Onset  . Heart disease Mother   . Kidney disease Mother   . Heart disease Father   . Breast cancer Maternal Grandmother   . Diabetes Maternal Grandfather    History   Substance Use Topics  . Smoking status: Former Smoker -- 2.00 packs/day for 16 years    Types: Cigarettes    Quit date: 04/08/2002  . Smokeless tobacco: Never Used  . Alcohol Use: No     Comment: Quit in 1987    Review of Systems  Constitutional: Positive for fatigue.  Respiratory: Positive for shortness of breath.   Cardiovascular: Positive for chest pain. Negative for leg swelling.  Neurological: Positive for weakness.  All other systems reviewed and are negative.     Allergies  Morphine and related  Home Medications   Prior to Admission medications   Medication Sig Start Date End Date Taking? Authorizing Provider  ALPRAZolam Duanne Moron) 1 MG tablet Take 0.25-1 mg by mouth 3 (three) times daily as needed for anxiety.  05/19/14  Yes Historical Provider, MD  aspirin EC 81 MG tablet Take 81 mg by mouth daily.    Yes Historical Provider, MD  cholecalciferol (VITAMIN D) 1000 UNITS tablet Take 2,000 Units by mouth daily.   Yes Historical Provider, MD  Cyanocobalamin (VITAMIN B-12 PO) Take 1 tablet by mouth daily.   Yes Historical Provider, MD  lamoTRIgine (LAMICTAL) 200 MG tablet Take 200 mg by mouth at bedtime.  07/30/13  Yes Historical Provider, MD  mirtazapine (REMERON) 45 MG tablet Take 45 mg by mouth at bedtime.   Yes  Historical Provider, MD  Multiple Vitamin (MULTIVITAMIN WITH MINERALS) TABS Take 1 tablet by mouth daily.    Yes Historical Provider, MD  simvastatin (ZOCOR) 20 MG tablet TAKE 1 TABLET (20 MG TOTAL) BY MOUTH DAILY AT 6 PM. 05/19/14  Yes Olam Idler, MD   BP 120/86 mmHg  Pulse 76  Temp(Src) 98 F (36.7 C) (Oral)  Resp 12  SpO2 98% Physical Exam  Constitutional: He is oriented to person, place, and time. He appears well-developed and well-nourished.  HENT:  Head: Normocephalic.  Eyes: Pupils are equal, round, and reactive to light.  Neck: Neck supple.  Cardiovascular: Normal rate and regular rhythm.   Pulmonary/Chest: Effort normal and breath sounds normal.   Abdominal: Soft. Bowel sounds are normal.  Musculoskeletal: He exhibits no edema or tenderness.  Lymphadenopathy:    He has no cervical adenopathy.  Neurological: He is alert and oriented to person, place, and time. He has normal strength. No cranial nerve deficit. Coordination normal. GCS eye subscore is 4. GCS verbal subscore is 5. GCS motor subscore is 6.  Skin: Skin is warm and dry.  Psychiatric: He has a normal mood and affect.  Nursing note and vitals reviewed.   ED Course  Procedures (including critical care time) Labs Review Labs Reviewed  CBC WITH DIFFERENTIAL/PLATELET - Abnormal; Notable for the following:    MCV 77.1 (*)    All other components within normal limits  COMPREHENSIVE METABOLIC PANEL - Abnormal; Notable for the following:    Glucose, Bld 100 (*)    GFR calc non Af Amer 90 (*)    All other components within normal limits  LIPASE, BLOOD  URINALYSIS, ROUTINE W REFLEX MICROSCOPIC  AMMONIA  I-STAT TROPOININ, ED    Imaging Review Ct Head Wo Contrast  07/15/2014   CLINICAL DATA:  Initial evaluation for acute confusion.  EXAM: CT HEAD WITHOUT CONTRAST  TECHNIQUE: Contiguous axial images were obtained from the base of the skull through the vertex without intravenous contrast.  COMPARISON:  Prior study from 02/11/2014  FINDINGS: Study somewhat degraded by motion artifact.  There is no acute intracranial hemorrhage or infarct. No mass lesion or midline shift. Gray-white matter differentiation is well maintained. Ventricles are normal in size without evidence of hydrocephalus. CSF containing spaces are within normal limits. No extra-axial fluid collection.  The calvarium is intact.  Orbital soft tissues are within normal limits.  The paranasal sinuses and mastoid air cells are well pneumatized and free of fluid.  Scalp soft tissues are unremarkable.  IMPRESSION: No acute intracranial process.   Electronically Signed   By: Jeannine Boga M.D.   On: 07/15/2014 22:40      EKG Interpretation None     Patient reports chest pain with activity one week ago, resolved with a brief rest period. He has felt weak for one week. Persistent mild shortness of breath, occasionally worse with exertion, onset in the last few days.  Denies nausea, vomiting, diarrhea.  Normal EKG.  No ischemic changes or pathologic q waves.  Negative troponin (expected as last chest pain was one week prior).  Patient also is reporting increased confusion and difficulty remembering recent events.  Neuro exam grossly normal.  CT of head without acute intracranial process.  Normal CXR.  Normal urine, cbc, cmet.  Mild elevation in ammonia level--will need to be monitored by PCP and gastroenterology.  Discussed with Dr. Betsey Holiday. MDM   Final diagnoses:  Confusion    Fatigue.     Etta Quill,  NP 07/16/14 0002  Orpah Greek, MD 07/16/14 0004

## 2014-07-15 NOTE — ED Notes (Signed)
Pt denying chest pain at this time.

## 2014-07-15 NOTE — ED Notes (Signed)
See physicians note C/o feeling weak onset 1 week Alert, no signs of acute distress.

## 2014-07-18 ENCOUNTER — Telehealth: Payer: Self-pay | Admitting: Family Medicine

## 2014-07-18 NOTE — Telephone Encounter (Signed)
Pt went on internet about high anominia levels. Would like to discuss this with someone Please advise

## 2014-07-18 NOTE — Telephone Encounter (Signed)
Will forward to PCP for advice. Cristofer Yaffe, CMA. 

## 2014-07-21 NOTE — Telephone Encounter (Signed)
Call patient to discuss ammonia level. He is concerned that his elevated ammonia could explain his episodic symptoms: dizziness, muscle weakness. I told him that was possible but not certain. We discuss his previous Hep C and alcohol abuse that could have damaged his liver and cause his ammonia to be elevated. I advised him to make an appointment to discuss this and his previous MRI Ab findings of pancreatic lesion.

## 2014-08-15 ENCOUNTER — Encounter: Payer: Self-pay | Admitting: Physician Assistant

## 2014-08-16 ENCOUNTER — Telehealth: Payer: Self-pay | Admitting: Gastroenterology

## 2014-08-16 NOTE — Telephone Encounter (Signed)
Pt states his stomach hurts and his back hurts.Pt states he has an appt with pain management tomorrow. Pt instructed to call back if he thinks he needs and appt for the abdominal pain.

## 2014-08-25 ENCOUNTER — Encounter: Payer: Self-pay | Admitting: Family Medicine

## 2014-08-25 ENCOUNTER — Ambulatory Visit (INDEPENDENT_AMBULATORY_CARE_PROVIDER_SITE_OTHER): Payer: Medicare Other | Admitting: Family Medicine

## 2014-08-25 VITALS — BP 135/82 | HR 63 | Temp 98.1°F | Ht 66.0 in | Wt 150.0 lb

## 2014-08-25 DIAGNOSIS — Z9989 Dependence on other enabling machines and devices: Secondary | ICD-10-CM

## 2014-08-25 DIAGNOSIS — I10 Essential (primary) hypertension: Secondary | ICD-10-CM

## 2014-08-25 DIAGNOSIS — G4733 Obstructive sleep apnea (adult) (pediatric): Secondary | ICD-10-CM | POA: Diagnosis not present

## 2014-08-25 DIAGNOSIS — F1010 Alcohol abuse, uncomplicated: Secondary | ICD-10-CM

## 2014-08-25 DIAGNOSIS — E785 Hyperlipidemia, unspecified: Secondary | ICD-10-CM | POA: Diagnosis not present

## 2014-08-25 DIAGNOSIS — Z23 Encounter for immunization: Secondary | ICD-10-CM

## 2014-08-25 LAB — LDL CHOLESTEROL, DIRECT: Direct LDL: 33 mg/dL

## 2014-08-25 NOTE — Progress Notes (Signed)
  Patient name: [name] MRN [id number]  Date of birth: 05-28-1955  CC & HPI:  [name] is a 59 y.o. male presenting today for HTN, HLD and abdominal pain.   CHRONIC HYPERTENSION  BP Readings from Last 3 Encounters:  08/25/14 135/82  07/16/14 106/69  07/15/14 131/91    Disease Monitoring  Chest pain: no   Dyspnea: no   Claudication: no  Medication compliance: Currently Diet controlled Preventitive Healthcare:    History  Smoking status  . Former Smoker -- 2.00 packs/day for 16 years  . Types: Cigarettes  . Quit date: 04/08/2002  Smokeless tobacco  . Never Used   HLD - No hx of MI or CVA but started on Statin due to many hospitalizations due to ches tpain  - Take intermittently, but would rather not take medication if dont have too  Abdominal Pain - Epigastric - Had endoscopic biopsy of pancreatic lesion that was negative for malignancy - Had colonoscopy for diverticulosis that was negative for malignancy - Currently has GI follow-up appointment next week to discuss MRI for additional evaluation of pancreatic lesion - Epigastric discomfort scribed is nagging without pain, nausea, vomiting, diarrhea  ROS: See HPI   Medical & Surgical Hx:  Reviewed  Medications & Allergies: Reviewed  Social History: Reviewed:   Objective Findings:  Vitals: BP 135/82 mmHg  Pulse 63  Temp(Src) 98.1 F (36.7 C) (Oral)  Ht 5\' 6"  (1.676 m)  Wt 150 lb (68.04 kg)  BMI 24.22 kg/m2  Gen: NAD CV: RRR w/o m/r/g, pulses +2 b/l Resp: CTAB w/ normal respiratory effort GI: BS +; No tenderness Lower Ext: No edema  Assessment & Plan:   Please See Problem Focused Assessment & Plan

## 2014-08-25 NOTE — Progress Notes (Signed)
I was the preceptor for this visit. 

## 2014-08-25 NOTE — Assessment & Plan Note (Signed)
Continues to use CPAP intermittently; encouraged nightly compliance

## 2014-08-25 NOTE — Patient Instructions (Signed)
It was great seeing you today.   1. I will check your cholesterol today and send you a letter with the results. You can stop taking your Zocor in the meantime.    Please bring all your medications to every doctors visit  Sign up for My Chart to have easy access to your labs results, and communication with your Primary care physician.  Next Appointment  Please call to make an appointment with Dr Berkley Harvey in 6 months.    I look forward to talking with you again at our next visit. If you have any questions or concerns before then, please call the clinic at [phone].  Take Care,   Dr Phill Myron

## 2014-08-25 NOTE — Assessment & Plan Note (Signed)
Blood pressure remains well-controlled after stopping atenolol last visit - Continues to follow healthy vegetarian diet - We'll continue to monitor without medications at this time  - possibly was on atenolol in the past for alcohol abuse - however no signs of varices or Hx of bleed

## 2014-08-25 NOTE — Assessment & Plan Note (Signed)
On Statin for multiple hospitalization for chest pain; No Hx of MI or CVA - Check LDL - Stop statin at this time

## 2014-08-25 NOTE — Progress Notes (Signed)
Given Pneumovax 23 per PCP request due to alcoholism as reason for receiving vaccine before age 59. [name], CMA.

## 2014-08-27 ENCOUNTER — Encounter: Payer: Self-pay | Admitting: Family Medicine

## 2014-08-28 ENCOUNTER — Other Ambulatory Visit: Payer: Self-pay | Admitting: Family Medicine

## 2014-09-02 ENCOUNTER — Telehealth: Payer: Self-pay | Admitting: Gastroenterology

## 2014-09-02 NOTE — Telephone Encounter (Signed)
He has appetite and he is eating but reports a steady weight loss. His stomach hurts. The MRI is 09/09/14. He will come in to see Amy and follow up on 09/12/14.

## 2014-09-09 ENCOUNTER — Other Ambulatory Visit: Payer: Self-pay | Admitting: Gastroenterology

## 2014-09-09 ENCOUNTER — Ambulatory Visit (HOSPITAL_COMMUNITY)
Admission: RE | Admit: 2014-09-09 | Discharge: 2014-09-09 | Disposition: A | Discharge status: Home / Self Care | Payer: Medicare Other | Source: Ambulatory Visit | Attending: Gastroenterology | Admitting: Gastroenterology

## 2014-09-09 DIAGNOSIS — R109 Unspecified abdominal pain: Secondary | ICD-10-CM | POA: Diagnosis not present

## 2014-09-09 DIAGNOSIS — B1920 Unspecified viral hepatitis C without hepatic coma: Secondary | ICD-10-CM | POA: Diagnosis not present

## 2014-09-09 DIAGNOSIS — K862 Cyst of pancreas: Secondary | ICD-10-CM

## 2014-09-09 DIAGNOSIS — K869 Disease of pancreas, unspecified: Secondary | ICD-10-CM | POA: Diagnosis present

## 2014-09-09 LAB — POCT I-STAT CREATININE: Creatinine, Ser: 0.9 mg/dL (ref 0.61–1.24)

## 2014-09-09 MED ORDER — GADOBENATE DIMEGLUMINE 529 MG/ML IV SOLN
15.0000 mL | Freq: Once | INTRAVENOUS | Status: AC | PRN
  Administered 2014-09-09: 14 mL via INTRAVENOUS

## 2014-09-12 ENCOUNTER — Ambulatory Visit (INDEPENDENT_AMBULATORY_CARE_PROVIDER_SITE_OTHER): Payer: Medicare Other | Admitting: Physician Assistant

## 2014-09-12 ENCOUNTER — Encounter: Payer: Self-pay | Admitting: Physician Assistant

## 2014-09-12 VITALS — BP 108/80 | HR 72 | Ht 65.75 in | Wt 153.5 lb

## 2014-09-12 DIAGNOSIS — R935 Abnormal findings on diagnostic imaging of other abdominal regions, including retroperitoneum: Secondary | ICD-10-CM

## 2014-09-12 DIAGNOSIS — R1013 Epigastric pain: Secondary | ICD-10-CM

## 2014-09-12 NOTE — Progress Notes (Signed)
Patient ID: [name], male   DOB: 1955-09-19, 59 y.o.   MRN: [id number]   Subjective:    Patient ID: [name], male    DOB: [birth year], 59 y.o.   MRN: [id number]  HPI [name] is a 59 year old white male known to Dr. Deatra Ina. He was seen in February 2016 with complaints of upper abdominal pain and weight loss of about 20 pounds over 6 months.. He had undergone a CT of the abdomen and pelvis and then MRI to further evaluate a pancreatic abnormality. MRI showed a small ovoid rim enhancing lesion adjacent to the uncinate process new from May 2015 this measured 10 mm x 9 mm. Liver was read as normal. He subsequently had EUS and biopsy with Dr. Ardis Hughs on 06/23/2014. Noted to have a subtle 14 mm lesion adjacent to the body of the pancreas directly abutting the vein and SMV.  From fluid aspiration showed abundant mucin and no malignant cells. He had follow-up MRI done on 09/09/2014. Again liver read as normal. The previously demonstrated small lesion has nearly resolved now measuring 8 mm. [name] to be a resolving postinflammatory finding. Pancreas otherwise normal and no biliary ductal dilation. Patient also had colonoscopy in March 2016 done for follow-up there was an area of erythema in the left colon which was biopsied and this was consistent with ischemia. Noted to have left colon diverticulosis. Patient comes in today for follow-up discussion regarding the MRI. He denies any real abdominal pain but says he still has a sensation of fullness or "like there is something bare". He says it's annoying but not really painful. This is fairly constant in his epigastrium. Appetite has been better and he had been pushing himself to eat and has gained weight over the past few months. His weight is stable since February. He has prior history of EtOH abuse but this has been inactive.  Review of Systems Pertinent positive and negative review of systems were noted in the above HPI section.  All other review of systems  was otherwise negative.  Outpatient Encounter Prescriptions as of 09/12/2014  Medication Sig  . ALPRAZolam (XANAX) 1 MG tablet Take 0.25-1 mg by mouth 3 (three) times daily as needed for anxiety.   Marland Kitchen aspirin EC 81 MG tablet Take 81 mg by mouth daily.   . chlorhexidine (PERIDEX) 0.12 % solution daily.  . cholecalciferol (VITAMIN D) 1000 UNITS tablet Take 2,000 Units by mouth daily.  . Cyanocobalamin (VITAMIN B-12 PO) Take 1 tablet by mouth daily.  . Glucosamine-Chondroitin (GLUCOSAMINE CHONDR COMPLEX PO) Take 1 tablet by mouth daily.  Marland Kitchen lamoTRIgine (LAMICTAL) 200 MG tablet Take 200 mg by mouth at bedtime.   . mirtazapine (REMERON) 45 MG tablet Take 45 mg by mouth at bedtime.  . Multiple Vitamin (MULTIVITAMIN WITH MINERALS) TABS Take 1 tablet by mouth daily.   . Oxycodone HCl 10 MG TABS Take 1 tablet by mouth every 6 (six) hours.  . simvastatin (ZOCOR) 20 MG tablet TAKE 1 TABLET (20 MG TOTAL) BY MOUTH DAILY AT 6 PM.  . TURMERIC PO Take 1 tablet by mouth daily.   Facility-Administered Encounter Medications as of 09/12/2014  Medication  . 0.9 %  sodium chloride infusion   Allergies  Allergen Reactions  . Morphine And Related Shortness Of Breath   Patient Active Problem List   Diagnosis Date Noted  . HLD (hyperlipidemia) 08/25/2014  . Low back pain 02/28/2014  . Dyspnea on exertion 09/07/2012  . OSA on CPAP 09/20/2009  . HYPERTENSION, BENIGN -  Diet Controlled 10/19/2008  . RHINITIS 05/13/2007  . HEPATITIS C 06/05/2006  . DEPRESSION, MAJOR, RECURRENT 06/05/2006  . BIPOLAR DISORDER 06/05/2006  . ANXIETY 06/05/2006  . Hx of Alcohol abuse 06/05/2006  . BACK PAIN, LOW 06/05/2006   History   Social History  . Marital Status: Divorced    Spouse Name: N/A  . Number of Children: 0  . Years of Education: GED   Occupational History  . DISABLED   .      Social History Main Topics  . Smoking status: Former Smoker -- 2.00 packs/day for 16 years    Types: Cigarettes    Quit date:  04/08/2002  . Smokeless tobacco: Never Used  . Alcohol Use: No     Comment: Quit in 1987  . Drug Use: No     Comment: Use to take cocaine, marijuana, per patient everything. Stopped in 1987.  Marland Kitchen Sexual Activity:    Partners: Female   Other Topics Concern  . Not on file   Social History Narrative   Patient lives at home alone.    Disabled.   Education GED    Both handed.   Caffeine two cups of coffee daily.          [name] family history includes Breast cancer in his maternal grandmother; Diabetes in his maternal grandfather; Heart disease in his father and mother; Kidney disease in his mother.      Objective:    Filed Vitals:   09/12/14 1327  BP: 108/80  Pulse: 72    Physical Exam  well-developed white male in no acute distress, blood pressure 108/80 pulse 72 height 5 foot 5 weight 153. (Weight was 154 in February 2016) HEENT; nontraumatic normocephalic EOMI PERRLA sclera anicteric, Supple; no JVD, Cardiovascular regular rate and rhythm with S1-S2 no murmur or gallop, Pulmonary; clear bilaterally, Abdomen; soft basically nontender there is no palpable mass or hepatosplenomegaly bowel sounds are present, Rectal; exam not done, Ext; no clubbing cyanosis or edema skin warm and dry, Psych ;mood and affect appropriate       Assessment & Plan:   #1 59 yo male with epigastric discomfort and benign small rim enhancing lesion adjacent to the pancreatic body - Recent follow up MRI shows this lesion to be smaller and resolving consistent with post inflammatory process/ pseudocyst.  No evidence for chronic pancreatitis by MRI, or cirrhosis #2 diverticulosis  #3 hx Hep C- treated  Plan; discussion with pt and he is reassured.  Will plan follow up office  Visit with Dr.Kaplan  In 3 --4 months to re-evaluate, consider f/u MRI of pancreas   Samuel Zeiter Genia Harold PA-C 09/12/2014   Cc: Olam Idler, MD

## 2014-09-12 NOTE — Patient Instructions (Signed)
We made you an appointment with Dr. Deatra Ina for 11-30-2014 at 1:45 PM.

## 2014-09-14 NOTE — Progress Notes (Signed)
Reviewed and agree with management. Makana Rostad D. Mairi Stagliano, M.D., FACG  

## 2014-10-02 ENCOUNTER — Ambulatory Visit (INDEPENDENT_AMBULATORY_CARE_PROVIDER_SITE_OTHER): Payer: Medicare Other

## 2014-10-02 ENCOUNTER — Ambulatory Visit (INDEPENDENT_AMBULATORY_CARE_PROVIDER_SITE_OTHER): Payer: Medicare Other | Admitting: Family Medicine

## 2014-10-02 VITALS — BP 138/96 | HR 89 | Temp 97.5°F | Resp 16 | Ht 66.2 in | Wt 153.0 lb

## 2014-10-02 DIAGNOSIS — M542 Cervicalgia: Secondary | ICD-10-CM

## 2014-10-02 DIAGNOSIS — M545 Low back pain, unspecified: Secondary | ICD-10-CM

## 2014-10-02 NOTE — Patient Instructions (Signed)
I will get you referred back to Dr. Katherine Roan for your back- will get this set up asap If you have any problems in the meantime let us know

## 2014-10-02 NOTE — Progress Notes (Signed)
[facility] [address], [place] [address] [phone]  Date:  10/02/2014   Name:  [name]   DOB:  [birth year]   MRN:  [id number]  PCP:  Samuel Myron, MD    Chief Complaint: Back Pain; rib pain'; Weight Loss; and Depression   History of Present Illness:  [name] is a 59 y.o. very pleasant male patient who presents with the following:  Last seen at our office in November when he was transported to the hospital with confusion.    He states that he is here today with back pain that is getting "worse and worse, its been bad for a long time but worse over the last month."  He also notes problems with his neck which are also long standing He states that he has not contacted his primary doctor or his pain doctor about these concerns because "I was trying to hold off."    His PCP is Dr. Berkley Bautista He is in pain management for his back.  Currently being treated with Butrans patch Never had any surgery on his back, he is not sure if he has ever seen neurosurgery or ortho about his back.  However further discussion reveals that he did see neurosurgery several years ago- Samuel Sheng, MD in [facility]  He also has a history of mental health issues and is on remeron and lamictal, as well as xanax  He has has some issues with a pancreatic abnormality; most recently seen by GI on 09/12/14- his work- up was negative, had an MRI which showed the lesion to be resolving.  He has noted weight loss of about 10 lbs over the last year but it appears this has been evaluated by GI  [facility].  He denies any SI   Patient Active Problem List   Diagnosis Date Noted  . HLD (hyperlipidemia) 08/25/2014  . Low back pain 02/28/2014  . Dyspnea on exertion 09/07/2012  . OSA on CPAP 09/20/2009  . HYPERTENSION, BENIGN - Diet Controlled 10/19/2008  . RHINITIS 05/13/2007  . HEPATITIS C 06/05/2006  . DEPRESSION, MAJOR, RECURRENT  06/05/2006  . BIPOLAR DISORDER 06/05/2006  . ANXIETY 06/05/2006  . Hx of Alcohol abuse 06/05/2006  . BACK PAIN, LOW 06/05/2006    Past Medical History  Diagnosis Date  . Hypertension   . Bipolar disorder   . Allergy   . Depression   . Chronic pain     lower back pain, "that has progressed"  . Panic attack   . Dyspnea   . Sleep apnea     no cpap used,still has machine(use rarely)  . Hepatitis C     hx. of"states he no longer has"" is clear".  . History of recreational drug use     "cocaine, marijuana, polysubstance" quit 1987.    Past Surgical History  Procedure Laterality Date  . Tonsillectomy    . Cardiac catheterization  2003    No CAD  . Sinus surgery    . Eus N/A 06/23/2014    Procedure: UPPER ENDOSCOPIC ULTRASOUND (EUS) LINEAR;  Surgeon: Milus Banister, MD;  Location: [facility];  Service: Endoscopy;  Laterality: N/A;    History  Substance Use Topics  . Smoking status: Former Smoker -- 2.00 packs/day for 16 years    Types: Cigarettes    Quit date: 04/08/2002  . Smokeless tobacco: Never Used  . Alcohol Use: No  Comment: Quit in 73    Family History  Problem Relation Age of Onset  . Heart disease Mother   . Kidney disease Mother   . Heart disease Father   . Breast cancer Maternal Grandmother   . Diabetes Maternal Grandfather     Allergies  Allergen Reactions  . Morphine And Related Shortness Of Breath    Medication list has been reviewed and updated.  Current Outpatient Prescriptions on File Prior to Visit  Medication Sig Dispense Refill  . ALPRAZolam (XANAX) 1 MG tablet Take 0.25-1 mg by mouth 3 (three) times daily as needed for anxiety.     Marland Kitchen aspirin EC 81 MG tablet Take 81 mg by mouth daily.     . chlorhexidine (PERIDEX) 0.12 % solution daily.    . cholecalciferol (VITAMIN D) 1000 UNITS tablet Take 2,000 Units by mouth daily.    . Cyanocobalamin (VITAMIN B-12 PO) Take 1 tablet by mouth daily.    . Glucosamine-Chondroitin (GLUCOSAMINE  CHONDR COMPLEX PO) Take 1 tablet by mouth daily.    Marland Kitchen lamoTRIgine (LAMICTAL) 200 MG tablet Take 200 mg by mouth at bedtime.     . mirtazapine (REMERON) 45 MG tablet Take 45 mg by mouth at bedtime.    . Multiple Vitamin (MULTIVITAMIN WITH MINERALS) TABS Take 1 tablet by mouth daily.     . Oxycodone HCl 10 MG TABS Take 1 tablet by mouth every 6 (six) hours.    . simvastatin (ZOCOR) 20 MG tablet TAKE 1 TABLET (20 MG TOTAL) BY MOUTH DAILY AT 6 PM. (Patient not taking: Reported on 10/02/2014) 30 tablet 2  . TURMERIC PO Take 1 tablet by mouth daily.     Current Facility-Administered Medications on File Prior to Visit  Medication Dose Route Frequency Provider Last Rate Last Dose  . 0.9 %  sodium chloride infusion   Intravenous Continuous Montez Morita, MD 20 mL/hr at 09/15/12 1635 500 mL at 09/15/12 1635    Review of Systems:  As per HPI- otherwise negative.   Physical Examination: Filed Vitals:   10/02/14 1205  BP: 138/96  Pulse: 89  Temp: 97.5 F (36.4 C)  Resp: 16   Filed Vitals:   10/02/14 1205  Height: 5' 6.2" (1.681 m)  Weight: 153 lb (69.4 kg)   Body mass index is 24.56 kg/(m^2). Ideal Body Weight: Weight in (lb) to have BMI = 25: 155.5  GEN: WDWN, NAD, Non-toxic, A & O x 3, chronically ill appearing male who appears older than age 30: Atraumatic, Normocephalic. Neck supple. No masses, No LAD. Ears and Nose: No external deformity. CV: RRR, No M/G/R. No JVD. No thrill. No extra heart sounds. PULM: CTA B, no wheezes, crackles, rhonchi. No retractions. No resp. distress. No accessory muscle use. ABD: S, NT, ND. No rebound. No HSM. EXTR: No c/c/e NEURO Normal gait.  PSYCH: Normally interactive. Conversant. Not depressed or anxious appearing.  Calm demeanor.  Normal strength and sensation of all extremities Normal motion of his cervical spine.  He endorses vague tenderness throughout the thoracic and lumbar areas- no specific point tenderness   [facility] reading (PRIMARY)  by  Dr. Lorelei Pont. Cervical spine:minimal degenerative change at C4/5/6 Thoracic spine: minimal degenerative change and mild scoliosis Lumbar spine: degenerative change at T12/L1 with wedging of T12.    CERVICAL SPINE COMPARISON: Thoracic spine radiographs-earlier same day  FINDINGS: C1 to the superior endplate of T1 is imaged on the provided lateral radiograph.  Normal alignment of the cervical spine. No anterolisthesis or  retrolisthesis. The bilateral facets appear normally aligned. The dens is normally positioned between the lateral masses of C1.  Cervical vertebral body heights are preserved. Prevertebral soft tissues are normal.  Mild multilevel cervical spine DDD, worse at C4-C5 and C5-C6 with disc space height loss, endplate irregularity and sclerosis.  Regional soft tissues appear normal. Limited visualization of lung apices is normal.  IMPRESSION: 1. No acute findings. 2. Mild multilevel cervical spine DDD, worse at C4-C5 and C5-C6.   THORACIC SPINE - 2 VIEW  COMPARISON: Chest x-ray 07/15/2014  FINDINGS: Examination demonstrates subtle curvature of the thoracic spine convex left unchanged. There is mild spondylosis throughout the thoracic spine. There is no evidence of compression fracture or subluxation. There is degenerative change of the visualized cervical spine. Pedicles are intact.  IMPRESSION: No acute findings.  Mild spondylosis of the thoracic spine with subtle curvature convex left unchanged.  LUMBAR SPINE - COMPLETE 4+ VIEW  COMPARISON: 05/24/2014 CT scan  FINDINGS: Mild sclerosis and irregularity of the superior L1 endplate is stable. Mild anterior wedging of T11 with mild T11-12 degenerative disc disease, stable. There is normal anterior-posterior alignment. There is minimal degenerative disc disease in the lumbar spine. L3-4 facet hypertrophy related to known pars defects. Mild dextroscoliosis.  IMPRESSION: No acute  abnormalities.  Assessment and Plan: Midline low back pain without sciatica - Plan: DG Thoracic Spine 2 View, DG Lumbar Spine Complete, Ambulatory referral to Neurosurgery  Neck pain - Plan: DG Cervical Spine Complete, Ambulatory referral to Neurosurgery  Here today seeking help for his long-standing back and neck pain.  As he is already involved with pain management I suggested a consult with neurosurgery as the next step.  He agrees with this plan and is given a disc with today's plain films.  Will refer back to the doctor he saw a few years ago in [facility] Otherwise encouraged him to continue to follow-up with his PCP, pain provider and mental health providers   Signed Lamar Blinks, MD

## 2014-10-17 DIAGNOSIS — G8929 Other chronic pain: Secondary | ICD-10-CM | POA: Insufficient documentation

## 2014-10-17 DIAGNOSIS — M5136 Other intervertebral disc degeneration, lumbar region: Secondary | ICD-10-CM | POA: Insufficient documentation

## 2014-10-17 DIAGNOSIS — M545 Low back pain, unspecified: Secondary | ICD-10-CM | POA: Insufficient documentation

## 2014-10-17 DIAGNOSIS — M51369 Other intervertebral disc degeneration, lumbar region without mention of lumbar back pain or lower extremity pain: Secondary | ICD-10-CM | POA: Insufficient documentation

## 2014-10-20 ENCOUNTER — Other Ambulatory Visit: Payer: Self-pay | Admitting: Neurosurgery

## 2014-10-20 DIAGNOSIS — M546 Pain in thoracic spine: Secondary | ICD-10-CM

## 2014-10-20 DIAGNOSIS — M545 Low back pain, unspecified: Secondary | ICD-10-CM

## 2014-10-28 ENCOUNTER — Ambulatory Visit
Admission: RE | Admit: 2014-10-28 | Discharge: 2014-10-28 | Disposition: A | Discharge status: Home / Self Care | Payer: Medicare Other | Source: Ambulatory Visit | Attending: Neurosurgery | Admitting: Neurosurgery

## 2014-10-28 DIAGNOSIS — M545 Low back pain, unspecified: Secondary | ICD-10-CM

## 2014-10-28 DIAGNOSIS — M546 Pain in thoracic spine: Secondary | ICD-10-CM

## 2014-11-21 ENCOUNTER — Encounter (HOSPITAL_COMMUNITY): Payer: Self-pay | Admitting: Emergency Medicine

## 2014-11-21 ENCOUNTER — Emergency Department (HOSPITAL_COMMUNITY)
Admission: EM | Admit: 2014-11-21 | Discharge: 2014-11-21 | Disposition: A | Discharge status: Home / Self Care | Payer: Medicare Other | Attending: Emergency Medicine | Admitting: Emergency Medicine

## 2014-11-21 DIAGNOSIS — Z7982 Long term (current) use of aspirin: Secondary | ICD-10-CM | POA: Diagnosis not present

## 2014-11-21 DIAGNOSIS — G8929 Other chronic pain: Secondary | ICD-10-CM | POA: Insufficient documentation

## 2014-11-21 DIAGNOSIS — I10 Essential (primary) hypertension: Secondary | ICD-10-CM | POA: Insufficient documentation

## 2014-11-21 DIAGNOSIS — N4889 Other specified disorders of penis: Secondary | ICD-10-CM | POA: Diagnosis not present

## 2014-11-21 DIAGNOSIS — F319 Bipolar disorder, unspecified: Secondary | ICD-10-CM | POA: Insufficient documentation

## 2014-11-21 DIAGNOSIS — Z8669 Personal history of other diseases of the nervous system and sense organs: Secondary | ICD-10-CM | POA: Diagnosis not present

## 2014-11-21 DIAGNOSIS — F410 Panic disorder [episodic paroxysmal anxiety] without agoraphobia: Secondary | ICD-10-CM | POA: Insufficient documentation

## 2014-11-21 DIAGNOSIS — Z79899 Other long term (current) drug therapy: Secondary | ICD-10-CM | POA: Diagnosis not present

## 2014-11-21 DIAGNOSIS — R300 Dysuria: Secondary | ICD-10-CM

## 2014-11-21 DIAGNOSIS — Z87891 Personal history of nicotine dependence: Secondary | ICD-10-CM | POA: Diagnosis not present

## 2014-11-21 DIAGNOSIS — Z8619 Personal history of other infectious and parasitic diseases: Secondary | ICD-10-CM | POA: Insufficient documentation

## 2014-11-21 DIAGNOSIS — Z9889 Other specified postprocedural states: Secondary | ICD-10-CM | POA: Insufficient documentation

## 2014-11-21 LAB — URINALYSIS, ROUTINE W REFLEX MICROSCOPIC
Bilirubin Urine: NEGATIVE
Glucose, UA: NEGATIVE mg/dL
Hgb urine dipstick: NEGATIVE
Ketones, ur: NEGATIVE mg/dL
Leukocytes, UA: NEGATIVE
Nitrite: NEGATIVE
Protein, ur: NEGATIVE mg/dL
Specific Gravity, Urine: 1.011 (ref 1.005–1.030)
Urobilinogen, UA: 0.2 mg/dL (ref 0.0–1.0)
pH: 5 (ref 5.0–8.0)

## 2014-11-21 NOTE — Discharge Instructions (Signed)

## 2014-11-21 NOTE — ED Notes (Signed)
Pt from home c/o penile pressure. He reports he hasn't tried to urinate this morning so unsure if there is a problem with urination. Denies abdominal pain.

## 2014-11-21 NOTE — ED Provider Notes (Signed)
CSN: [id number]     Arrival date & time 11/21/14  0607 History   First MD Initiated Contact with Patient 11/21/14 0700     Chief Complaint  Patient presents with  . Penis Pain     (Consider location/radiation/quality/duration/timing/severity/associated sxs/prior Treatment) HPI Comments: Patient reports that he has been experiencing lower abdominal, suprapubic and pelvic area "pressure". Earlier this morning he urinated and the pain became severe. Patient reported continuous, sharp stabbing pains in the low groin area that spontaneously resolved on the way to the ER. No fever, nausea, vomiting. He has not noticed any hematuria. No change in bowel.  Patient is a 59 y.o. male presenting with penile pain.  Penis Pain    Past Medical History  Diagnosis Date  . Hypertension   . Bipolar disorder   . Allergy   . Depression   . Chronic pain     lower back pain, "that has progressed"  . Panic attack   . Dyspnea   . Sleep apnea     no cpap used,still has machine(use rarely)  . Hepatitis C     hx. of"states he no longer has"" is clear".  . History of recreational drug use     "cocaine, marijuana, polysubstance" quit 1987.   Past Surgical History  Procedure Laterality Date  . Tonsillectomy    . Cardiac catheterization  2003    No CAD  . Sinus surgery    . Eus N/A 06/23/2014    Procedure: UPPER ENDOSCOPIC ULTRASOUND (EUS) LINEAR;  Surgeon: Milus Banister, MD;  Location: [facility];  Service: Endoscopy;  Laterality: N/A;   Family History  Problem Relation Age of Onset  . Heart disease Mother   . Kidney disease Mother   . Heart disease Father   . Breast cancer Maternal Grandmother   . Diabetes Maternal Grandfather    Social History  Substance Use Topics  . Smoking status: Former Smoker -- 2.00 packs/day for 16 years    Types: Cigarettes    Quit date: 04/08/2002  . Smokeless tobacco: Never Used  . Alcohol Use: No     Comment: Quit in 1987    Review of Systems   Genitourinary: Positive for dysuria and penile pain.  All other systems reviewed and are negative.     Allergies  Morphine and related  Home Medications   Prior to Admission medications   Medication Sig Start Date End Date Taking? Authorizing Provider  ALPRAZolam Duanne Moron) 1 MG tablet Take 0.25-1 mg by mouth 3 (three) times daily as needed for anxiety.  05/19/14  Yes Historical Provider, MD  aspirin EC 81 MG tablet Take 81 mg by mouth daily.    Yes Historical Provider, MD  Buprenorphine (BUTRANS) 15 MCG/HR [place] onto the skin.   Yes Historical Provider, MD  chlorhexidine (PERIDEX) 0.12 % solution Use as directed 15 mLs in the mouth or throat 3 (three) times daily.  09/06/14  Yes Historical Provider, MD  cholecalciferol (VITAMIN D) 1000 UNITS tablet Take 2,000 Units by mouth daily.   Yes Historical Provider, MD  Cyanocobalamin (VITAMIN B-12 PO) Take 1 tablet by mouth daily.   Yes Historical Provider, MD  Glucosamine-Chondroitin (GLUCOSAMINE CHONDR COMPLEX PO) Take 1 tablet by mouth daily.   Yes Historical Provider, MD  ibuprofen (ADVIL,MOTRIN) 200 MG tablet Take 400 mg by mouth every 6 (six) hours as needed for moderate pain.   Yes Historical Provider, MD  lamoTRIgine (LAMICTAL) 200 MG tablet Take 200 mg by mouth at bedtime.  07/30/13  Yes Historical Provider, MD  mirtazapine (REMERON) 45 MG tablet Take 45 mg by mouth at bedtime.   Yes Historical Provider, MD  Multiple Vitamin (MULTIVITAMIN WITH MINERALS) TABS Take 1 tablet by mouth daily.    Yes Historical Provider, MD  TURMERIC PO Take 1 tablet by mouth daily.   Yes Historical Provider, MD  simvastatin (ZOCOR) 20 MG tablet TAKE 1 TABLET (20 MG TOTAL) BY MOUTH DAILY AT 6 PM. Patient not taking: Reported on 10/02/2014 05/19/14   Olam Idler, MD   BP 122/88 mmHg  Pulse 69  Temp(Src) 97.5 F (36.4 C) (Oral)  Resp 18  SpO2 100% Physical Exam  Constitutional: He is oriented to person, place, and time. He appears well-developed and  well-nourished. No distress.  HENT:  Head: Normocephalic and atraumatic.  Right Ear: Hearing normal.  Left Ear: Hearing normal.  Nose: Nose normal.  Mouth/Throat: Oropharynx is clear and moist and mucous membranes are normal.  Eyes: Conjunctivae and EOM are normal. Pupils are equal, round, and reactive to light.  Neck: Normal range of motion. Neck supple.  Cardiovascular: Regular rhythm, S1 normal and S2 normal.  Exam reveals no gallop and no friction rub.   No murmur heard. Pulmonary/Chest: Effort normal and breath sounds normal. No respiratory distress. He exhibits no tenderness.  Abdominal: Soft. Normal appearance and bowel sounds are normal. There is no hepatosplenomegaly. There is no tenderness. There is no rebound, no guarding, no tenderness at McBurney's point and negative Murphy's sign. No hernia. Hernia confirmed negative in the right inguinal area and confirmed negative in the left inguinal area.  Genitourinary: Penis normal. Right testis shows no mass, no swelling and no tenderness. Left testis shows no mass, no swelling and no tenderness.  Musculoskeletal: Normal range of motion.  Neurological: He is alert and oriented to person, place, and time. He has normal strength. No cranial nerve deficit or sensory deficit. Coordination normal. GCS eye subscore is 4. GCS verbal subscore is 5. GCS motor subscore is 6.  Skin: Skin is warm, dry and intact. No rash noted. No cyanosis.  Psychiatric: He has a normal mood and affect. His speech is normal and behavior is normal. Thought content normal.  Nursing note and vitals reviewed.   ED Course  Procedures (including critical care time) Labs Review Labs Reviewed  URINALYSIS, ROUTINE W REFLEX MICROSCOPIC (NOT AT [facility])    Imaging Review No results found. I, Copper Kirtley J., personally reviewed and evaluated these images and lab results as part of my medical decision-making.   EKG Interpretation None      MDM   Final  diagnoses:  None   dysuria  Presents to the emergency department for evaluation of lower abdominal and pelvic discomfort. He reports intermittent episodes of pressure-like symptoms over the last couple of days, but this morning he developed severe, sharp and stabbing pain in the low abdomen and groin area. This pain has spontaneously resolved. Examination is entirely normal now including genitourinary exam. Symptoms sound like they could be consistent with kidney stone, but with pain spontaneously resolving, would have been a passed stone. No imaging necessary at this time. Urinalysis does not suggest infection. Patient counseled to return to the ER immediately for any recurrence of pain symptoms, otherwise will refer to urology.    Orpah Greek, MD 11/21/14 [phone]

## 2014-11-24 ENCOUNTER — Telehealth: Payer: Self-pay | Admitting: Gastroenterology

## 2014-11-24 NOTE — Telephone Encounter (Signed)
The patient is complaining of slight constipation and nausea. He has been referred to Urology for a possible kidney stone. He was seen in the ER very recently. He will use Miralax for the constipation. Call back for an appointment if he fails to improve.

## 2014-11-30 ENCOUNTER — Encounter: Payer: Self-pay | Admitting: Gastroenterology

## 2014-11-30 ENCOUNTER — Other Ambulatory Visit (INDEPENDENT_AMBULATORY_CARE_PROVIDER_SITE_OTHER): Payer: Medicare Other

## 2014-11-30 ENCOUNTER — Ambulatory Visit (INDEPENDENT_AMBULATORY_CARE_PROVIDER_SITE_OTHER): Payer: Medicare Other | Admitting: Gastroenterology

## 2014-11-30 VITALS — BP 100/68 | HR 80 | Ht 66.0 in | Wt 147.0 lb

## 2014-11-30 DIAGNOSIS — Z8719 Personal history of other diseases of the digestive system: Secondary | ICD-10-CM | POA: Diagnosis not present

## 2014-11-30 DIAGNOSIS — G8929 Other chronic pain: Secondary | ICD-10-CM | POA: Diagnosis not present

## 2014-11-30 DIAGNOSIS — R1013 Epigastric pain: Secondary | ICD-10-CM

## 2014-11-30 LAB — CBC WITH DIFFERENTIAL/PLATELET
Basophils Absolute: 0 10*3/uL (ref 0.0–0.1)
Basophils Relative: 0.5 % (ref 0.0–3.0)
Eosinophils Absolute: 0 10*3/uL (ref 0.0–0.7)
Eosinophils Relative: 0.4 % (ref 0.0–5.0)
HCT: 41.1 % (ref 39.0–52.0)
Hemoglobin: 13.7 g/dL (ref 13.0–17.0)
Lymphocytes Relative: 37.4 % (ref 12.0–46.0)
Lymphs Abs: 2.1 10*3/uL (ref 0.7–4.0)
MCHC: 33.4 g/dL (ref 30.0–36.0)
MCV: 77.7 fl — ABNORMAL LOW (ref 78.0–100.0)
Monocytes Absolute: 0.4 10*3/uL (ref 0.1–1.0)
Monocytes Relative: 7.6 % (ref 3.0–12.0)
Neutro Abs: 3 10*3/uL (ref 1.4–7.7)
Neutrophils Relative %: 54.1 % (ref 43.0–77.0)
Platelets: 232 10*3/uL (ref 150.0–400.0)
RBC: 5.28 Mil/uL (ref 4.22–5.81)
RDW: 14.6 % (ref 11.5–15.5)
WBC: 5.6 10*3/uL (ref 4.0–10.5)

## 2014-11-30 LAB — COMPREHENSIVE METABOLIC PANEL
ALT: 15 U/L (ref 0–53)
AST: 22 U/L (ref 0–37)
Albumin: 4.6 g/dL (ref 3.5–5.2)
Alkaline Phosphatase: 57 U/L (ref 39–117)
BUN: 12 mg/dL (ref 6–23)
CO2: 26 mEq/L (ref 19–32)
Calcium: 9.6 mg/dL (ref 8.4–10.5)
Chloride: 103 mEq/L (ref 96–112)
Creatinine, Ser: 1.02 mg/dL (ref 0.40–1.50)
GFR: 79.87 mL/min (ref >60.00)
Glucose, Bld: 102 mg/dL — ABNORMAL HIGH (ref 70–99)
Potassium: 3.9 mEq/L (ref 3.5–5.1)
Sodium: 137 mEq/L (ref 135–145)
Total Bilirubin: 0.5 mg/dL (ref 0.2–1.2)
Total Protein: 7.8 g/dL (ref 6.0–8.3)

## 2014-11-30 LAB — AMYLASE: Amylase: 36 U/L (ref 27–131)

## 2014-11-30 LAB — LIPASE: Lipase: 22 U/L (ref 11.0–59.0)

## 2014-11-30 NOTE — Patient Instructions (Signed)
Your physician has requested that you go to the basement for the following lab work before leaving today: CBC, CMET, Amylase and Lipase.

## 2014-11-30 NOTE — Progress Notes (Signed)
      History of Present Illness:  [name] has returned complaining of constipation is slightly increased abdominal pain.  He claims that abdominal pain is constant as well as low back pain.  He is on chronic narcotics for this.  Over the past 2 weeks he has become constipated.  He blames this on altering his diet because he has spent time in the hospital with his mother he's had bouts of nausea.  He is without fever.    Review of Systems: Pertinent positive and negative review of systems were noted in the above HPI section. All other review of systems were otherwise negative.    Current Medications, Allergies, Past Medical History, Past Surgical History, Family History and Social History were reviewed in [facility] record  Vital signs were reviewed in today's medical record. Physical Exam: General: Well developed , well nourished, no acute distress Skin: anicteric Head: Normocephalic and atraumatic Eyes:  sclerae anicteric, EOMI Ears: Normal auditory acuity Mouth: No deformity or lesions Lymph Nodes: no lymphadenopathy Lungs: Clear throughout to auscultation Heart: Regular rate and rhythm; no murmurs, rubs or brui: Gastroinestinal:  Soft, non tender and non distended. No masses, hepatosplenomegaly or hernias noted. Normal Bowel sounds Rectal:deferred Musculoskeletal: Symmetrical with no gross deformities  Pulses:  Normal pulses noted Extremities: No clubbing, cyanosis, edema or deformities noted Neurological: Alert oriented x 4, grossly nonfocal Psychological:  Alert and cooperative. Normal mood and affect  See Assessment and Plan under Problem List

## 2014-11-30 NOTE — Assessment & Plan Note (Signed)
I suspect that chronic pain to chronic pancreatitis.  He has not suffered from any acute event.  Resolution of the changes on MRI suggest a transient inflammatory reaction in the past.  Constipation is probably contributing to his discomfort and nausea.  Recommendations #1 MiraLAX as needed.  Patient will try to get back to his normal diet which helps with constipation #2 check CBC, comprehensive metabolic profile, lipase and amylase

## 2014-12-02 ENCOUNTER — Ambulatory Visit (HOSPITAL_COMMUNITY)
Admission: RE | Admit: 2014-12-02 | Discharge: 2014-12-02 | Disposition: A | Discharge status: Home / Self Care | Payer: Medicare Other | Source: Ambulatory Visit | Attending: Family Medicine | Admitting: Family Medicine

## 2014-12-02 ENCOUNTER — Other Ambulatory Visit (HOSPITAL_COMMUNITY): Payer: Medicare Other

## 2014-12-02 ENCOUNTER — Encounter: Payer: Self-pay | Admitting: Family Medicine

## 2014-12-02 ENCOUNTER — Ambulatory Visit (INDEPENDENT_AMBULATORY_CARE_PROVIDER_SITE_OTHER): Payer: Medicare Other | Admitting: Family Medicine

## 2014-12-02 VITALS — BP 129/91 | HR 84 | Temp 98.6°F | Ht 66.0 in | Wt 143.1 lb

## 2014-12-02 DIAGNOSIS — R0602 Shortness of breath: Secondary | ICD-10-CM

## 2014-12-02 DIAGNOSIS — R0600 Dyspnea, unspecified: Secondary | ICD-10-CM | POA: Insufficient documentation

## 2014-12-02 DIAGNOSIS — R110 Nausea: Secondary | ICD-10-CM | POA: Diagnosis not present

## 2014-12-02 DIAGNOSIS — R1013 Epigastric pain: Secondary | ICD-10-CM

## 2014-12-02 DIAGNOSIS — G8929 Other chronic pain: Secondary | ICD-10-CM

## 2014-12-02 DIAGNOSIS — R109 Unspecified abdominal pain: Secondary | ICD-10-CM | POA: Diagnosis not present

## 2014-12-02 DIAGNOSIS — I10 Essential (primary) hypertension: Secondary | ICD-10-CM | POA: Diagnosis not present

## 2014-12-02 NOTE — Patient Instructions (Addendum)
For abdominal pain: - labs looked good the other day - checking CT scan of your abdomen to evaluate further  For shortness of breath: - oxygen levels good today - EKG today showed no changes from before - checking ultrasound of your heart and chest xray - please try to use your CPAP  Please follow up with Dr. Berkley Harvey in 1 week to see how you are doing, review results, and decide on additional plans.If any worsening of pain, shortness of breath, or other issues please go to ER immediately for evaluation.  Be well, Dr. Ardelia Mems

## 2014-12-02 NOTE — Progress Notes (Addendum)
Patient ID: [name], male   DOB: Feb 19, 1956, 59 y.o.   MRN: [id number]  HPI:  Pt presents for a same day appointment to discuss multiple issues.  Pt reports hx of pancreatitis several months ago. Abdominal pain has persisted since that time but has flared up recently. Was seen by his GI doctor a few days ago and had labwork that was unremarkable. Abd pain thought to potentially be secondary to change of diet while patient was caring for his mother in the hospital. He had been quite constipated at that time. Has resumed his diet of fruits and vegetables, and now bowels are moving better. Also is drinking lots of water. No blood in the stool. No vomiting but has been nauseated. No fevers. Also endorses dry mouth and bad smelling urine.   Pt reports waking feeling quite short of breath. This happened about a week ago. Happened this morning badly. Breathing now feels better, but not all the way back to normal. Yesterday evening he felt weak and disoriented.  Denies any swelling or chest pain. Lays flat at night. Hx of OSA on CPAP but has not used in a few months. Nonsmoker. Denies any hx of cardiac issues. Does have hx of anxiety and bipolar disorder. Has hx of chronic pain, treated with buprenorphine at [facility] for the last 3 months.  ROS: See HPI  [place]: hx hepatitis C, depression/bipolar/anxiety, hx alcohol abuse, OSA noncompliant with CPAP, HTN, HLD  PHYSICAL EXAM: BP 129/91 mmHg  Pulse 84  Temp(Src) 98.6 F (37 C) (Oral)  Ht 5\' 6"  (1.676 m)  Wt 143 lb 1.6 oz (64.91 kg)  BMI 23.11 kg/m2 Gen: NAD, cooperative HEENT: NCAT. MMM. Heart: RRR no murmur Lungs: CTAB NWOB. Speaks in full sentences and ambulates without distress. Abdomen: soft. Normoactive bowel sounds. No masses or organomegaly. Mild tenderness throughout. No rebound or peritoneal signs. Neuro: grossly nonfocal, speech normal, gait normal   ASSESSMENT/PLAN:  1. Abdominal discomfort - vague in description,  perhaps related to chronic pancreatitis. Abdominal exam benign today. Labs a few days ago were unremarkable. Patient tolerating PO. Will proceed with outpatient CT abdomen/pelvis with contrast to further evaluate abdominal pain. F/u with PCP in 1 week.  2. Dyspnea - no hypoxia during ambulation in clinic today. EKG performed and is stable without signs of ischemia. Doubt PE as no hypoxia or tachycardia. Last echo July 2014 with grade 2 diastolic dysfunction. Sx's may represent PND from CHF (although no signs of acute volume overload today). Will check CXR, obtain repeat echo & have pt f/u in 1 week with PCP. Also encouraged use of CPAP.    Discussed return precautions, see AVS.   FOLLOW UP: F/u in 1 week with PCP for above issues.  [place]. Ardelia Mems, [place]

## 2014-12-05 ENCOUNTER — Other Ambulatory Visit: Payer: Self-pay | Admitting: Family Medicine

## 2014-12-05 MED ORDER — ONDANSETRON HCL 4 MG PO TABS: 4.0000 mg | ORAL_TABLET | Freq: Three times a day (TID) | ORAL | Status: DC | PRN

## 2014-12-05 NOTE — Telephone Encounter (Signed)
QTc reviewed and was normal.  Please inform patient that I sent in zofran for him.  [name], MD

## 2014-12-05 NOTE — Telephone Encounter (Signed)
Will forward to Dr. Ardelia Mems who saw patient last. Johnney Ou

## 2014-12-05 NOTE — Telephone Encounter (Signed)
Pt called and he would like something called in since he nauseated and has to have a CT done tomorrow. jw

## 2014-12-05 NOTE — Telephone Encounter (Signed)
Patient informed. 

## 2014-12-06 ENCOUNTER — Other Ambulatory Visit (HOSPITAL_COMMUNITY): Payer: Medicare Other

## 2014-12-06 ENCOUNTER — Ambulatory Visit (HOSPITAL_COMMUNITY)
Admission: RE | Admit: 2014-12-06 | Discharge: 2014-12-06 | Disposition: A | Discharge status: Home / Self Care | Payer: Medicare Other | Source: Ambulatory Visit | Attending: Family Medicine | Admitting: Family Medicine

## 2014-12-06 ENCOUNTER — Encounter (HOSPITAL_COMMUNITY): Payer: Self-pay

## 2014-12-06 DIAGNOSIS — R109 Unspecified abdominal pain: Secondary | ICD-10-CM | POA: Diagnosis present

## 2014-12-06 DIAGNOSIS — R110 Nausea: Secondary | ICD-10-CM | POA: Diagnosis present

## 2014-12-06 DIAGNOSIS — R1013 Epigastric pain: Secondary | ICD-10-CM

## 2014-12-06 DIAGNOSIS — G8929 Other chronic pain: Secondary | ICD-10-CM | POA: Insufficient documentation

## 2014-12-06 MED ORDER — IOHEXOL 300 MG/ML  SOLN
100.0000 mL | Freq: Once | INTRAMUSCULAR | Status: AC | PRN
  Administered 2014-12-06: 100 mL via INTRAVENOUS

## 2014-12-07 ENCOUNTER — Telehealth: Payer: Self-pay | Admitting: Family Medicine

## 2014-12-07 MED ORDER — POLYETHYLENE GLYCOL 3350 17 GM/SCOOP PO POWD: 17.0000 g | Freq: Every day | ORAL | Status: DC | PRN

## 2014-12-07 NOTE — Telephone Encounter (Signed)
Spoke with patient and advised of CT scan results & CXR results. Recommend daily miralax. Will send in rx. Also recommended pt f/u with PCP in the next 1-2 weeks. Pt appreciative.  [name], MD

## 2014-12-07 NOTE — Telephone Encounter (Signed)
Will forward to Dr. Ardelia Mems. Jeanene Mena,CMA

## 2014-12-07 NOTE — Telephone Encounter (Signed)
Attempted to reach patient by phone to discuss CT scan results, which showed constipation but no other acute issues. No answer, did not leave VM. Will attempt to call patient again later today.  [name], MD

## 2014-12-07 NOTE — Telephone Encounter (Signed)
Pt is returning call and would like to speak to the doctor when possible. jw

## 2014-12-21 ENCOUNTER — Ambulatory Visit (INDEPENDENT_AMBULATORY_CARE_PROVIDER_SITE_OTHER): Payer: Medicare Other | Admitting: Family Medicine

## 2014-12-21 ENCOUNTER — Encounter: Payer: Self-pay | Admitting: Family Medicine

## 2014-12-21 VITALS — BP 119/82 | HR 72 | Temp 98.3°F | Ht 66.0 in | Wt 145.0 lb

## 2014-12-21 DIAGNOSIS — R1013 Epigastric pain: Secondary | ICD-10-CM

## 2014-12-21 DIAGNOSIS — Z23 Encounter for immunization: Secondary | ICD-10-CM

## 2014-12-21 DIAGNOSIS — R0602 Shortness of breath: Secondary | ICD-10-CM | POA: Diagnosis present

## 2014-12-21 DIAGNOSIS — G8929 Other chronic pain: Secondary | ICD-10-CM

## 2014-12-21 NOTE — Assessment & Plan Note (Signed)
Mild improvement in pain with Miralax - Reviewed CT scan results with patient; no acute findings; previous pancreatic mass resolved - Encouraged follow-up with GI as needed

## 2014-12-21 NOTE — Progress Notes (Signed)
  Patient name: [name] MRN [id number]  Date of birth: May 19, 1955  CC & HPI:  [name] is a 59 y.o. male presenting today for f/u for CT scan.  He reports CT scan was for his chronic abdominal pain and shortness of breath.  He did start taking Mirilax and has noticed improvement in his bowel movements as well as some improvement in his abdominal pain.  He continues to have episodes of shortness of breath with exertion.  He feels this is mostly related to his deconditioning and inability to exercise , as well as his chronic pain medications.   ROS: See HPI   Medical & Surgical Hx:  Reviewed  Medications & Allergies: Reviewed  Social History: Reviewed:   Objective Findings:  Vitals: BP 119/82 mmHg  Pulse 72  Temp(Src) 98.3 F (36.8 C) (Oral)  Ht 5\' 6"  (1.676 m)  Wt 145 lb (65.772 kg)  BMI 23.41 kg/m2  Gen: NAD CV: RRR w/o m/r/g, pulses +2 b/l Resp: CTAB w/ normal respiratory effort  Assessment & Plan:   Abdominal pain, chronic, epigastric Mild improvement in pain with Miralax - Reviewed CT scan results with patient; no acute findings; previous pancreatic mass resolved - Encouraged follow-up with GI as needed  SOB (shortness of breath) He continues to have shortness of breath worse with exertion.  Likely multifactorial due to deconditioning with limited activity due to chronic back pain and anxiety.  Previous echo revealed mild LVH 2014.  Previous smoker, quit greater than 10 years ago.  Noncompliant with CPAP (feels like he is suffocating).  Hemoglobin normal with low MCV, possibly due to iron deficiency.  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

## 2014-12-21 NOTE — Assessment & Plan Note (Addendum)
He continues to have shortness of breath worse with exertion.  Likely multifactorial due to deconditioning with limited activity due to chronic back pain and anxiety.  Previous echo revealed mild LVH 2014.  Previous smoker, quit greater than 10 years ago.  Noncompliant with CPAP (feels like he is suffocating).  Hemoglobin normal with low MCV, possibly due to iron deficiency.  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

## 2015-02-10 ENCOUNTER — Telehealth: Payer: Self-pay | Admitting: Family Medicine

## 2015-02-10 NOTE — Telephone Encounter (Signed)
Patient would like to speak to nurse/MD about his stomach pain. Please call patient.

## 2015-02-10 NOTE — Telephone Encounter (Signed)
Has been having burning in stomach x 1 month--"not excruciating, just burning".  Has regular BM, no blood in stool.  States he has some anxiety.  Notices burning more when he is "stressed".  Informed patient he can try OTC antacids (Mylanta, Tums) until he comes in for appt to be evaluated on 02/13/15 with Dr. Jerline Pain.  Patient verbalized understanding and will try OTC med.  Burna Forts, BSN, RN-BC

## 2015-02-13 ENCOUNTER — Encounter: Payer: Self-pay | Admitting: Family Medicine

## 2015-02-13 ENCOUNTER — Ambulatory Visit (INDEPENDENT_AMBULATORY_CARE_PROVIDER_SITE_OTHER): Payer: Medicare Other | Admitting: Family Medicine

## 2015-02-13 VITALS — BP 121/89 | HR 72 | Temp 98.1°F | Ht 66.5 in | Wt 151.4 lb

## 2015-02-13 DIAGNOSIS — R1013 Epigastric pain: Secondary | ICD-10-CM | POA: Diagnosis present

## 2015-02-13 DIAGNOSIS — G8929 Other chronic pain: Secondary | ICD-10-CM | POA: Diagnosis not present

## 2015-02-13 MED ORDER — CLARITHROMYCIN 500 MG PO TABS: 500.0000 mg | ORAL_TABLET | Freq: Two times a day (BID) | ORAL | Status: DC

## 2015-02-13 MED ORDER — PANTOPRAZOLE SODIUM 40 MG PO TBEC: 40.0000 mg | DELAYED_RELEASE_TABLET | Freq: Two times a day (BID) | ORAL | Status: DC

## 2015-02-13 MED ORDER — AMOXICILLIN 500 MG PO CAPS: 1000.0000 mg | ORAL_CAPSULE | Freq: Two times a day (BID) | ORAL | Status: DC

## 2015-02-13 NOTE — Progress Notes (Signed)
Subjective:  [name] is a 59 y.o. male who presents to the [facility] today with a chief complaint of abdominal pain.   HPI:  Abdominal Pain Patient presents with epigastric abdominal pain for the past 3 weeks. Patient reports that this is a new pain, however per chart review, it appears that the patient has been seen for abdominal pain 3 months ago. Pain is described as burning. Pain does not radiate and is sometimes worsened after eating. Patient has a history of chronic pancreatitis, though states that he is no longer drinking alcohol and that the current pain does not feel like pancreatitis. No fevers or chills. No weight loss or weight gain. No nausea or vomiting. Has to take miralax for constipation, though states that he usually has 2 BMs per day. Denies ever being treated for H pylori in the past.  ROS: Per HPI  PMH:  The following were reviewed and entered/updated in epic: Past Medical History  Diagnosis Date  . Hypertension   . Bipolar disorder ([place])   . Allergy   . Depression   . Chronic pain     lower back pain, "that has progressed"  . Panic attack   . Dyspnea   . Sleep apnea     no cpap used,still has machine(use rarely)  . Hepatitis C     hx. of"states he no longer has"" is clear".  . History of recreational drug use ([place])     "cocaine, marijuana, polysubstance" quit 1987.   Patient Active Problem List   Diagnosis Date Noted  . SOB (shortness of breath) 12/21/2014  . Abdominal pain, chronic, epigastric 11/30/2014  . HLD (hyperlipidemia) 08/25/2014  . Low back pain 02/28/2014  . Dyspnea on exertion 09/07/2012  . OSA on CPAP 09/20/2009  . HYPERTENSION, BENIGN - Diet Controlled 10/19/2008  . RHINITIS 05/13/2007  . HEPATITIS C 06/05/2006  . DEPRESSION, MAJOR, RECURRENT 06/05/2006  . BIPOLAR DISORDER 06/05/2006  . ANXIETY 06/05/2006  . Hx of Alcohol abuse 06/05/2006  . BACK PAIN, LOW 06/05/2006   Past Surgical History  Procedure Laterality Date  .  Tonsillectomy    . Cardiac catheterization  2003    No CAD  . Sinus surgery    . Eus N/A 06/23/2014    Procedure: UPPER ENDOSCOPIC ULTRASOUND (EUS) LINEAR;  Surgeon: Milus Banister, MD;  Location: [facility] ENDOSCOPY;  Service: Endoscopy;  Laterality: N/A;     Objective:  Physical Exam: BP 121/89 mmHg  Pulse 72  Temp(Src) 98.1 F (36.7 C) (Oral)  Ht 5' 6.5" (1.689 m)  Wt 151 lb 6 oz (68.663 kg)  BMI 24.07 kg/m2  Gen: NAD, resting comfortably CV: RRR with no murmurs appreciated Lungs: NWOB, CTAB with no crackles, wheezes, or rhonchi GI: Normal bowel sounds present. Mildly tender in epigastric area. Nondistended. No Murphy's sign.  MSK: no edema, cyanosis, or clubbing noted Skin: warm, dry Neuro: grossly normal, moves all extremities Psych: Normal affect and thought content   Assessment/Plan:  Abdominal pain, chronic, epigastric Patient noted to have positive H pylori test last year, however denies ever being treated for this. No treatments recorded in our EMR either. Will treat with 2 week course of protonix, amoxicillin, and clarithromycin. Instructed patient to return 4 weeks after treatment to check urease breath test. Abdominal pain may be related to chronic pancreatitis. Abd CT earlier this year was negative.  If continues to have chronic abdominal pain after treatment for full H pylori infection, will likely need referral to GI.  Algis Greenhouse. Jerline Pain, [place] Resident PGY-2 02/13/2015 11:59 AM

## 2015-02-13 NOTE — Patient Instructions (Addendum)
Your abdominal pain may be due to a bacterial infection from a bacteria called H Pylori. This infection requires 3 medications to treat. One medication is called protonix and is a medication that is used to reduce the acid in your stomach. Please take this medication twice daily. We will also treat you with 2 types of antibiotics. Please take both for the full 14 day course.  When you finish your course of treatment, please come back to the clinic 4 weeks to test to make sure your infection has been treated. Do not take any acid-blocker medications or antibiotics for 4 weeks until you take your test.  Take care,  Dr Jerline Pain

## 2015-02-13 NOTE — Assessment & Plan Note (Signed)
Patient noted to have positive H pylori test last year, however denies ever being treated for this. No treatments recorded in our EMR either. Will treat with 2 week course of protonix, amoxicillin, and clarithromycin. Instructed patient to return 4 weeks after treatment to check urease breath test. Abdominal pain may be related to chronic pancreatitis. Abd CT earlier this year was negative.  If continues to have chronic abdominal pain after treatment for full H pylori infection, will likely need referral to GI.

## 2015-03-20 ENCOUNTER — Encounter: Payer: Self-pay | Admitting: Family Medicine

## 2015-03-20 ENCOUNTER — Ambulatory Visit (INDEPENDENT_AMBULATORY_CARE_PROVIDER_SITE_OTHER): Payer: Medicare Other | Admitting: Family Medicine

## 2015-03-20 VITALS — BP 126/80 | HR 84 | Temp 98.3°F | Ht 66.0 in | Wt 149.0 lb

## 2015-03-20 DIAGNOSIS — G8929 Other chronic pain: Secondary | ICD-10-CM | POA: Diagnosis not present

## 2015-03-20 DIAGNOSIS — A048 Other specified bacterial intestinal infections: Secondary | ICD-10-CM

## 2015-03-20 DIAGNOSIS — B9681 Helicobacter pylori [H. pylori] as the cause of diseases classified elsewhere: Secondary | ICD-10-CM

## 2015-03-20 DIAGNOSIS — R1013 Epigastric pain: Secondary | ICD-10-CM | POA: Diagnosis not present

## 2015-03-20 NOTE — Assessment & Plan Note (Signed)
Improved with treatment for h pylori but symptoms recurred when he stopped them (this would imply to me that the protonix may be what he was experiencing benefit from). Finished triple therapy 2 weeks ago. Will check stool antigen today and follow up with results, if positive will do quad therapy, if negative may just recommend continuing trial of PPI for gastritis/GERD.

## 2015-03-20 NOTE — Patient Instructions (Signed)
We will check the stool for the bacteria H. Pylori.  I will call you with the results -- if positive I will likely prescribe another course of antibiotics. If negative, I may just recommend treatment with acid suppression as you may be having GERD.

## 2015-03-20 NOTE — Progress Notes (Signed)
   Subjective:    Patient ID: [name], male    DOB: Apr 12, 1955, 59 y.o.   MRN: [id number]  HPI  CC: stomach issues  # H. Pylori infection:  Took medicines, finished 2 weeks ago. Felt improvement while on medicines but got worse again when he stopped them  Still having issues   Describes pain as "burning" for half to 2/3 of the day  Doesn't specifically notice any relation worsening/improvement with food  Pain located near umbilicus ROS: no fevers, no diarrhea  Social Hx: former smoker  Review of Systems   See HPI for ROS.   Past medical history, surgical, family, and social history reviewed and updated in the EMR as appropriate. Objective:  BP 126/80 mmHg  Pulse 84  Temp(Src) 98.3 F (36.8 C) (Oral)  Ht 5\' 6"  (1.676 m)  Wt 149 lb (67.586 kg)  BMI 24.06 kg/m2 Vitals and nursing note reviewed  General: NAD, walking around room Abdomen: soft, thin, mildly tender superior umbilicus, no rebound or guarding. Normal bowel sounds Neuro: alert and oriented, no deficits Skin: no rashes  Assessment & Plan:  Abdominal pain, chronic, epigastric Improved with treatment for h pylori but symptoms recurred when he stopped them (this would imply to me that the protonix may be what he was experiencing benefit from). Finished triple therapy 2 weeks ago. Will check stool antigen today and follow up with results, if positive will do quad therapy, if negative may just recommend continuing trial of PPI for gastritis/GERD.

## 2015-03-23 NOTE — Addendum Note (Signed)
Addended by: Lianne Bushy on: 03/23/2015 10:21 AM   Modules accepted: Orders

## 2015-03-24 LAB — HELICOBACTER PYLORI  SPECIAL ANTIGEN: H. PYLORI Antigen: NOT DETECTED

## 2015-03-25 ENCOUNTER — Telehealth: Payer: Self-pay | Admitting: Family Medicine

## 2015-03-25 NOTE — Telephone Encounter (Signed)
Called and informed patient of negative h pylori testing. Again discussed with him that he was right near the 2 week recommendation for testing off PPI, so this has a small possibility of being a false negative. At this time recommended OTC reflux medicines (zantac, omeprazole) in addition to diet modification like he has been doing (given some brief diet recommendations for avoiding medications that would exacerbate reflux). He had no further questions. -Dr. Lamar Benes

## 2015-05-23 ENCOUNTER — Telehealth: Payer: Self-pay | Admitting: Family Medicine

## 2015-05-23 NOTE — Telephone Encounter (Signed)
Pt decided to make appt. No message needed. Closing. Sadie Reynolds, ASA

## 2015-05-26 ENCOUNTER — Ambulatory Visit (INDEPENDENT_AMBULATORY_CARE_PROVIDER_SITE_OTHER): Payer: Medicare Other | Admitting: Family Medicine

## 2015-05-26 VITALS — BP 120/80 | HR 71 | Temp 97.7°F | Wt 156.8 lb

## 2015-05-26 DIAGNOSIS — R739 Hyperglycemia, unspecified: Secondary | ICD-10-CM

## 2015-05-26 DIAGNOSIS — R682 Dry mouth, unspecified: Secondary | ICD-10-CM

## 2015-05-26 LAB — POCT GLYCOSYLATED HEMOGLOBIN (HGB A1C): Hemoglobin A1C: 5.1

## 2015-05-26 MED ORDER — BIOTENE DRY MOUTH DT GEL: DENTAL | Status: DC

## 2015-05-26 NOTE — Patient Instructions (Signed)
Your dry mouth is due to medication side effects. Please use the biotene gel to your tongue as needed to help with dry mouth.  Talk with your regular doctors about changing some of your medications to improve the side effects.  Please come back to see your regular doctor soon.  Take care,  Dr Jerline Pain

## 2015-05-26 NOTE — Assessment & Plan Note (Signed)
Likely due to medication side effects - patient is on several medications that can cause dry mouth including remeron, lamictal, and xanax. A1c today normal. No dry eyes or rash suggestive of systemic illness. Will treat symptomatically with saliva substitute therapy. Encouraged patient to talk with psychiatrist about possibly changing some of his medications.

## 2015-05-26 NOTE — Progress Notes (Signed)
    Subjective:  [name] is a 60 y.o. male who presents to the [facility] today for same day appointment with a chief complaint of dry mouth.   HPI:  Dry Mouth Patient has had issues with dry mouth for several years, though reports that it has worsened over the past couple months. No recent changes in medications. No dry eyes. He has tried chewing gum and using hard candy which helps some, though he is not able to do either of these at night. Symptoms are worse at night. Has been using a humidifier which has helped some. Also reports increased urinary frequency. No weight loss. No dry or irritated eyes.   ROS: Per HPI  Objective:  Physical Exam: BP 120/80 mmHg  Pulse 71  Temp(Src) 97.7 F (36.5 C) (Oral)  Wt 156 lb 12.8 oz (71.124 kg)  Gen: NAD, resting comfortably HEENT: Dry cracked lips noted. Dry appearing mucus membranes without lesions. Tears present in eyes bilaterally. No conjunctival erythema.  CV: RRR with no murmurs appreciated Pulm: NWOB, CTAB with no crackles, wheezes, or rhonchi Skin: warm, dry, no rashes  Results for orders placed or performed in visit on 05/26/15 (from the past 72 hour(s))  POCT glycosylated hemoglobin (Hb A1C)     Status: None   Collection Time: 05/26/15  2:54 PM  Result Value Ref Range   Hemoglobin A1C 5.1    Assessment/Plan:  Dry mouth Likely due to medication side effects - patient is on several medications that can cause dry mouth including remeron, lamictal, and xanax. A1c today normal. No dry eyes or rash suggestive of systemic illness. Will treat symptomatically with saliva substitute therapy. Encouraged patient to talk with psychiatrist about possibly changing some of his medications.   Algis Greenhouse. Jerline Pain, [facility] Resident PGY-2 05/26/2015 3:30 PM

## 2015-07-19 ENCOUNTER — Telehealth: Payer: Self-pay | Admitting: Family Medicine

## 2015-07-19 NOTE — Telephone Encounter (Signed)
Please call and have him make an appointment to discuss this.  We would need to have a discussion about what medical issue he thinks prevents him from serving on jury duty. I can only complete a form as to what medical he thinks precludes him from jury duty, but it is up to the courts to decide if that is an acceptable exemption.

## 2015-07-19 NOTE — Telephone Encounter (Signed)
Will forward to MD. Damieon Armendariz,CMA  

## 2015-07-19 NOTE — Telephone Encounter (Signed)
Mr. Kindschi son, Richardson Landry called to ask for a note excusing patient from [employer].  Want note to say that patient should be permanently removed from serving.  Asking that list of medical issues be presented in note as well.  Call son when letter is ready for pickup.

## 2015-07-19 NOTE — Telephone Encounter (Signed)
Spoke with son and they will have [facility] provider fill this form out due knowing patient's mental capacity better.  Tyneshia Stivers,CMA

## 2015-07-26 IMAGING — CT CT HEAD W/O CM
1 series · 16 of 30 positions shown, 20 images · non-contrast
Comparison: Prior study from 02/11/2014

CLINICAL DATA: Initial evaluation for acute confusion.

EXAM:
CT HEAD WITHOUT CONTRAST
TECHNIQUE: Contiguous axial images were obtained from the base of the skull
through the vertex without intravenous contrast.

[Series 2: head 5.0 h31s · axial · 0.44mm/px · z∈[-162,-17]mm · 16 of 33 slices shown, 20 images]
[im 2/33  brain]
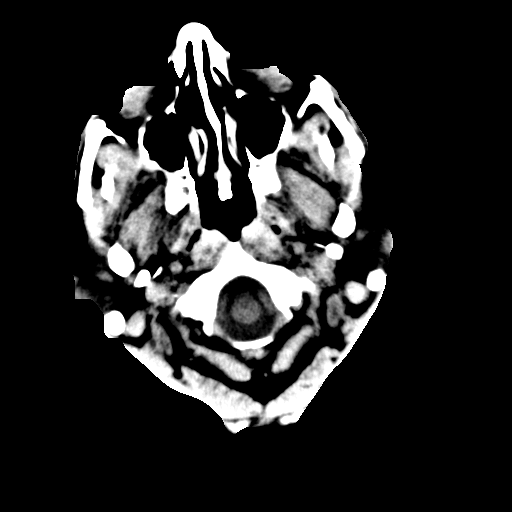
[im 2/33  bone]
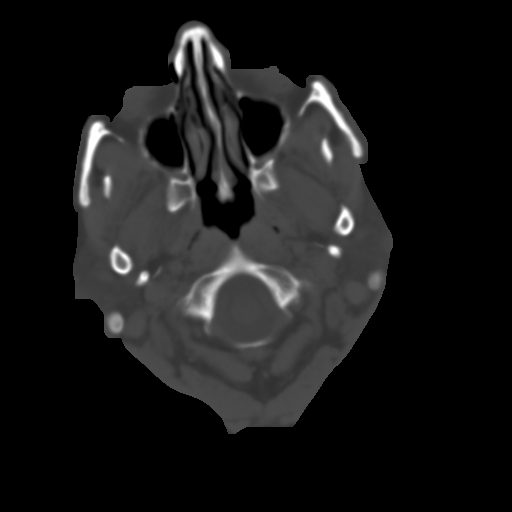
[im 4/33  brain]
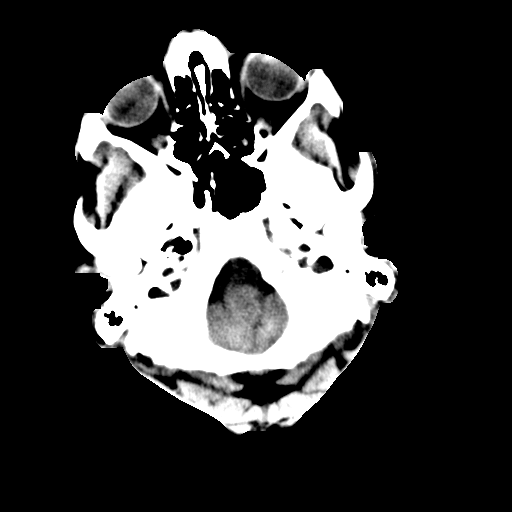
[im 6/33  brain]
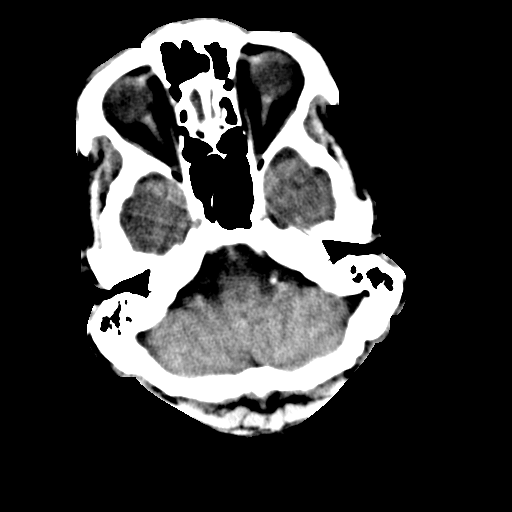
[im 8/33  brain]
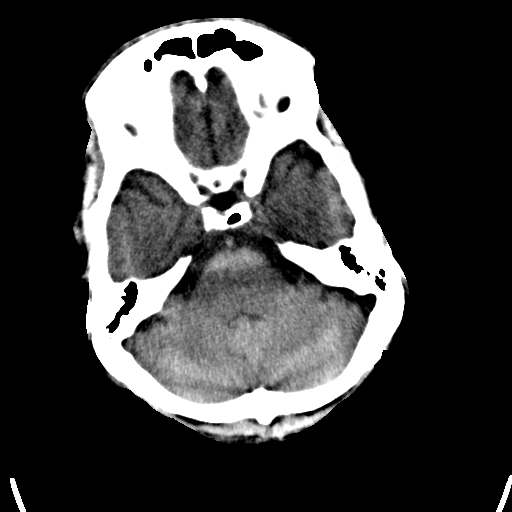
[im 9/33  brain]
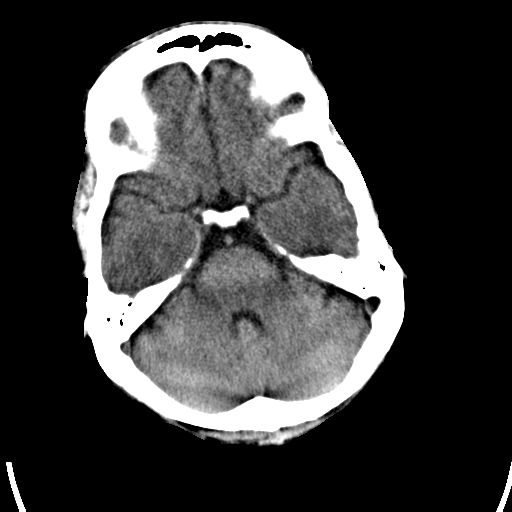
[im 9/33  bone]
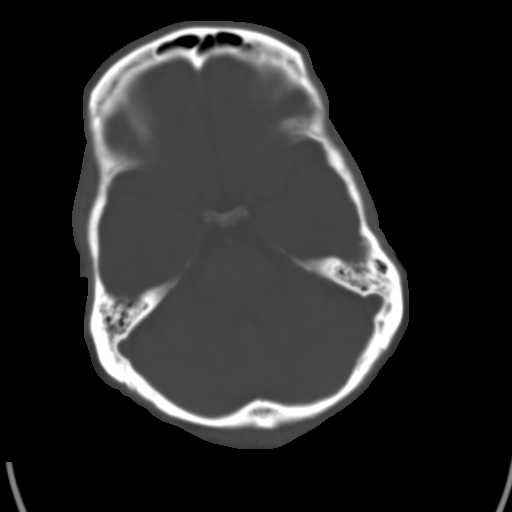
[im 12/33  brain]
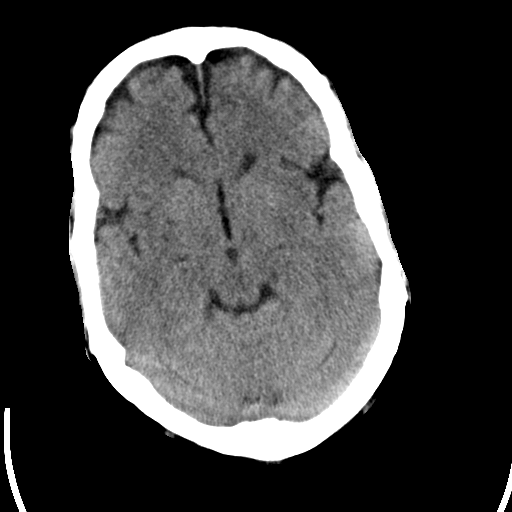
[im 14/33  brain]
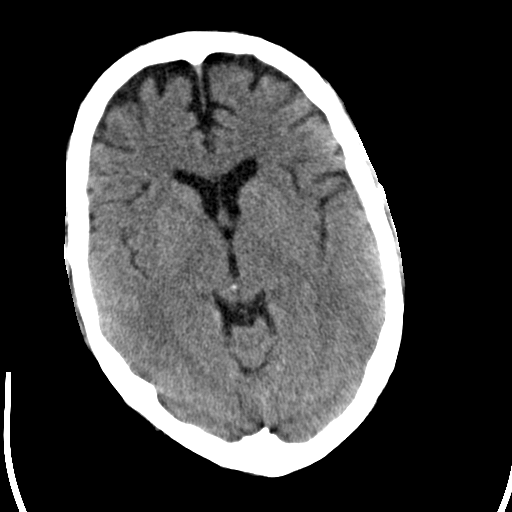
[im 16/33  brain]
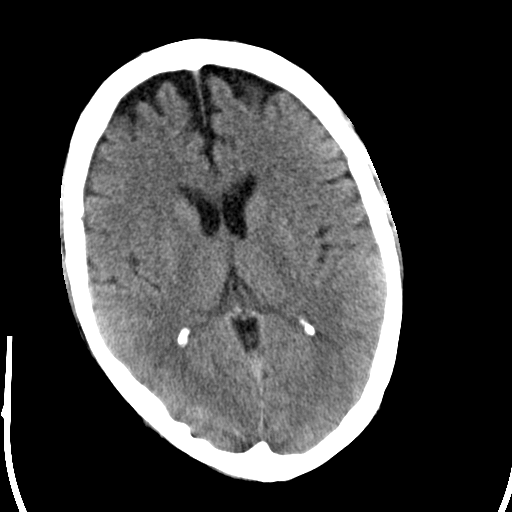
[im 17/33  brain]
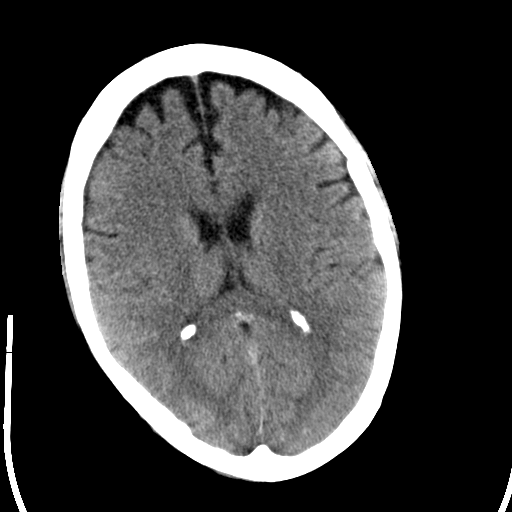
[im 17/33  bone]
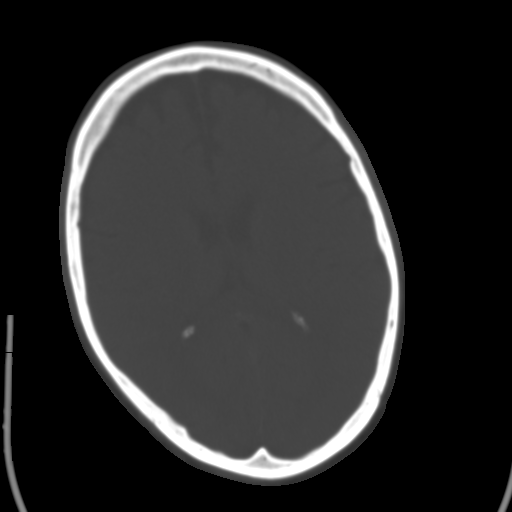
[im 19/33  brain]
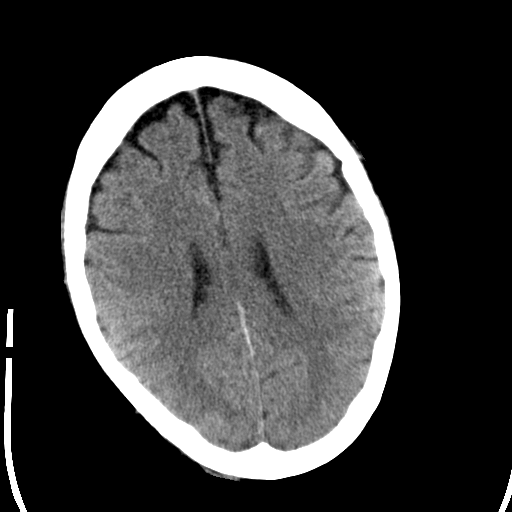
[im 21/33  brain]
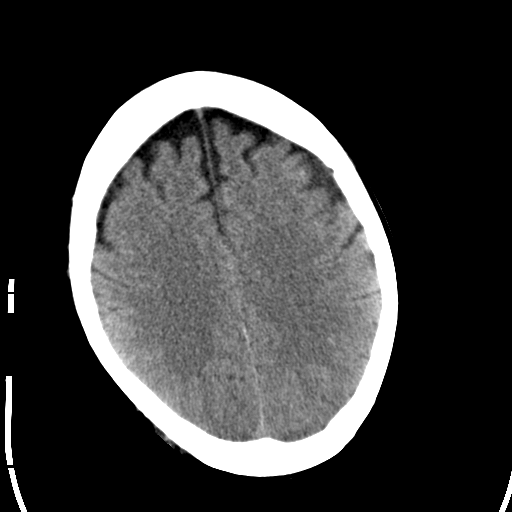
[im 24/33  brain]
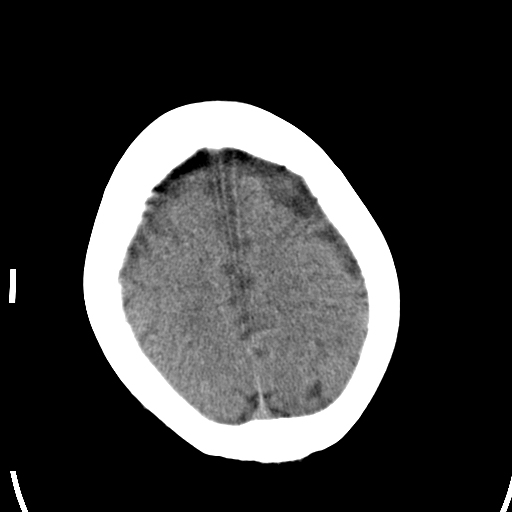
[im 25/33  brain]
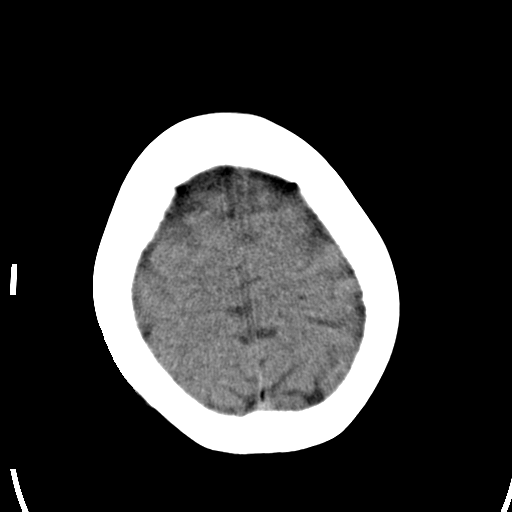
[im 25/33  bone]
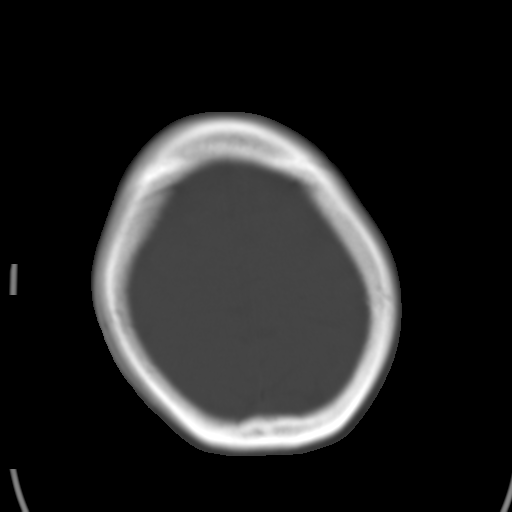
[im 27/33  brain]
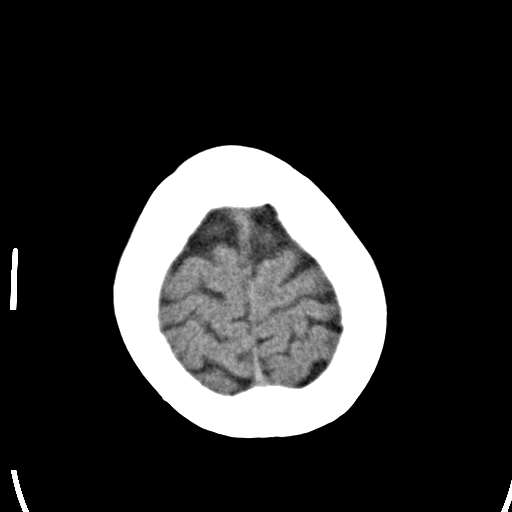
[im 29/33  brain]
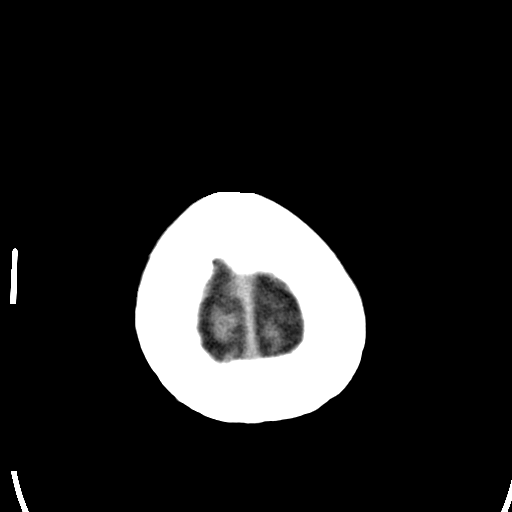
[im 31/33  brain]
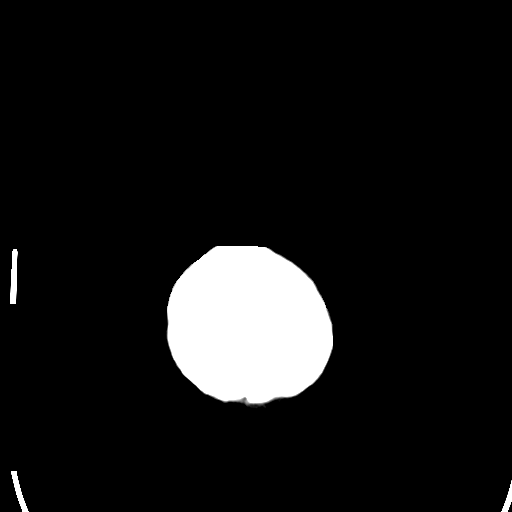

[16 of 30 positions shown; findings below may reference images not displayed]

FINDINGS: Study somewhat degraded by motion artifact.

There is no acute intracranial hemorrhage or infarct. No mass lesion
or midline shift. Gray-white matter differentiation is well
maintained. Ventricles are normal in size without evidence of
hydrocephalus. CSF containing spaces are within normal limits. No
extra-axial fluid collection.

The calvarium is intact.

Orbital soft tissues are within normal limits.

The paranasal sinuses and mastoid air cells are well pneumatized and
free of fluid.

Scalp soft tissues are unremarkable.
IMPRESSION: No acute intracranial process.

## 2015-07-31 ENCOUNTER — Other Ambulatory Visit: Payer: Self-pay | Admitting: Family Medicine

## 2015-08-01 ENCOUNTER — Other Ambulatory Visit: Payer: Self-pay | Admitting: Family Medicine

## 2015-08-04 ENCOUNTER — Ambulatory Visit
Admission: RE | Admit: 2015-08-04 | Discharge: 2015-08-04 | Disposition: A | Discharge status: Home / Self Care | Payer: Medicare Other | Source: Ambulatory Visit | Attending: Physical Medicine and Rehabilitation | Admitting: Physical Medicine and Rehabilitation

## 2015-08-04 ENCOUNTER — Other Ambulatory Visit: Payer: Self-pay | Admitting: Physical Medicine and Rehabilitation

## 2015-08-04 DIAGNOSIS — M542 Cervicalgia: Secondary | ICD-10-CM

## 2015-09-06 ENCOUNTER — Ambulatory Visit (INDEPENDENT_AMBULATORY_CARE_PROVIDER_SITE_OTHER): Payer: Medicare Other | Admitting: Family Medicine

## 2015-09-06 ENCOUNTER — Encounter: Payer: Self-pay | Admitting: Family Medicine

## 2015-09-06 VITALS — BP 133/87 | HR 84 | Wt 157.0 lb

## 2015-09-06 DIAGNOSIS — M545 Low back pain: Secondary | ICD-10-CM

## 2015-09-06 DIAGNOSIS — Z114 Encounter for screening for human immunodeficiency virus [HIV]: Secondary | ICD-10-CM | POA: Diagnosis not present

## 2015-09-06 DIAGNOSIS — G8929 Other chronic pain: Secondary | ICD-10-CM | POA: Diagnosis not present

## 2015-09-06 LAB — COMPLETE METABOLIC PANEL WITH GFR
ALT: 20 U/L (ref 9–46)
AST: 22 U/L (ref 10–35)
Albumin: 4.6 g/dL (ref 3.6–5.1)
Alkaline Phosphatase: 59 U/L (ref 40–115)
BUN: 19 mg/dL (ref 7–25)
CO2: 24 mmol/L (ref 20–31)
Calcium: 10.1 mg/dL (ref 8.6–10.3)
Chloride: 104 mmol/L (ref 98–110)
Creat: 1.05 mg/dL (ref 0.70–1.33)
GFR, Est African American: >89 mL/min (ref >=60)
GFR, Est Non African American: 78 mL/min (ref >=60)
Glucose, Bld: 103 mg/dL — ABNORMAL HIGH (ref 65–99)
Potassium: 4.5 mmol/L (ref 3.5–5.3)
Sodium: 140 mmol/L (ref 135–146)
Total Bilirubin: 0.4 mg/dL (ref 0.2–1.2)
Total Protein: 7.4 g/dL (ref 6.1–8.1)

## 2015-09-06 LAB — CBC
HCT: 42 % (ref 38.5–50.0)
Hemoglobin: 14.6 g/dL (ref 13.2–17.1)
MCH: 26.7 pg — ABNORMAL LOW (ref 27.0–33.0)
MCHC: 34.8 g/dL (ref 32.0–36.0)
MCV: 76.9 fL — ABNORMAL LOW (ref 80.0–100.0)
MPV: 9 fL (ref 7.5–12.5)
Platelets: 245 10*3/uL (ref 140–400)
RBC: 5.46 MIL/uL (ref 4.20–5.80)
RDW: 15.5 % — ABNORMAL HIGH (ref 11.0–15.0)
WBC: 9.1 10*3/uL (ref 3.8–10.8)

## 2015-09-06 MED ORDER — KETOROLAC TROMETHAMINE 30 MG/ML IJ SOLN
30.0000 mg | Freq: Once | INTRAMUSCULAR | Status: AC
  Administered 2015-09-06: 30 mg via INTRAMUSCULAR

## 2015-09-06 MED ORDER — IBUPROFEN 600 MG PO TABS: 600.0000 mg | ORAL_TABLET | Freq: Three times a day (TID) | ORAL | Status: DC | PRN

## 2015-09-06 MED ORDER — BACLOFEN 10 MG PO TABS: 10.0000 mg | ORAL_TABLET | Freq: Every evening | ORAL | Status: DC | PRN

## 2015-09-06 NOTE — Patient Instructions (Signed)
I have order some blood work and x-rays to evaluate your back pain.  - Take baclofen 10 mg at night as needed to help with back pain and muscle spasms - Take ibuprofen 600 mg every 8 hours for the next 5-7 days - Continue to use your heating pad and gentle stretching as needed - Follow-up in the next 1-2 weeks if back pain not improving

## 2015-09-06 NOTE — Assessment & Plan Note (Signed)
Acute on chronic thoracic/lumbar back pain.  - DG thoracic & lumbar order to assess for worsening DJD or fracture - Check early-morning testosterone given history of chronic opioid therapy - Check CBC, CMP - Toradol injection 30 mg IM given - Recommended ibuprofen 600mg  8 hours scheduled for the next 5-7 days - Baclofen 10 mg daily at bedtime when necessary the next 1-2 weeks - Recommended following up with PCP if pain not improving with treatment or sooner if develops new or concerning symptoms

## 2015-09-06 NOTE — Progress Notes (Signed)
   Subjective:    Patient ID: [name], male    DOB: September 24, 1955, 60 y.o.   MRN: [id number]  Seen for Same day visit for   CC: back pain  Comes in today for acute on chronic back pain.  He has had chronic lumbar, thoracic and cervical back pain for multiple years, treated by pain management.  He reports worsening of his back pain in the past 2-3 weeks he associates with increased activity.  He reports needing to take additional pain medication about beyond his prescribed amount to treat the pain.  Recently seen by pain management clinic and had cervical spine x-ray that he reports shows progressive disc disease.  He is most concerned now, but the pain in his lower thoracic back, he describes as sharp sensation going from lower back radiating to his mid spine.  Denies any fevers, chills, recent infections or antibiotic use.  Denies any night sweats or weight loss.  Denies any lower extremity weakness, numbness, urinary retention, saddle anesthesia.  He has been using heating pads and following up with his physical therapy.  Also reports doing tai chi. He is concerned that this flare seems more severe prolonged than his previous attacks, and wonders if "something else is going on".  History of alcohol abuse.  History of chronic opioid therapy.   Smoking history noted Review of Systems   See HPI for ROS. Objective:  BP 133/87 mmHg  Pulse 84  Wt 157 lb (71.215 kg)  General: NAD Cardiac: RRR, normal heart sounds, no murmurs.  Respiratory: CTAB, normal effort Back: Bilateral tenderness to paraspinal muscles from midthoracic to lumbar.  No spinal process tenderness.  Negative straight leg raise bilaterally.  Lower extremity strength and sensation intact.     Assessment & Plan:   BACK PAIN, LOW Acute on chronic thoracic/lumbar back pain.  - DG thoracic & lumbar order to assess for worsening DJD or fracture - Check early-morning testosterone given history of chronic opioid therapy - Check CBC,  CMP - Toradol injection 30 mg IM given - Recommended ibuprofen '600mg'$  8 hours scheduled for the next 5-7 days - Baclofen 10 mg daily at bedtime when necessary the next 1-2 weeks - Recommended following up with PCP if pain not improving with treatment or sooner if develops new or concerning symptoms

## 2015-09-06 NOTE — Addendum Note (Signed)
Addended by: Valerie Roys on: 09/06/2015 01:57 PM   Modules accepted: Orders, SmartSet

## 2015-09-07 LAB — HIV ANTIBODY (ROUTINE TESTING W REFLEX): HIV 1&2 Ab, 4th Generation: NONREACTIVE

## 2015-09-11 ENCOUNTER — Encounter (HOSPITAL_COMMUNITY): Payer: Self-pay | Admitting: Emergency Medicine

## 2015-09-11 ENCOUNTER — Emergency Department (HOSPITAL_COMMUNITY)
Admission: EM | Admit: 2015-09-11 | Discharge: 2015-09-11 | Disposition: A | Discharge status: Home / Self Care | Payer: Medicare Other | Attending: Emergency Medicine | Admitting: Emergency Medicine

## 2015-09-11 DIAGNOSIS — Z87891 Personal history of nicotine dependence: Secondary | ICD-10-CM | POA: Insufficient documentation

## 2015-09-11 DIAGNOSIS — I10 Essential (primary) hypertension: Secondary | ICD-10-CM | POA: Insufficient documentation

## 2015-09-11 DIAGNOSIS — Z792 Long term (current) use of antibiotics: Secondary | ICD-10-CM | POA: Diagnosis not present

## 2015-09-11 DIAGNOSIS — R200 Anesthesia of skin: Secondary | ICD-10-CM | POA: Insufficient documentation

## 2015-09-11 DIAGNOSIS — F319 Bipolar disorder, unspecified: Secondary | ICD-10-CM | POA: Insufficient documentation

## 2015-09-11 DIAGNOSIS — Z9889 Other specified postprocedural states: Secondary | ICD-10-CM | POA: Insufficient documentation

## 2015-09-11 DIAGNOSIS — H538 Other visual disturbances: Secondary | ICD-10-CM | POA: Diagnosis not present

## 2015-09-11 DIAGNOSIS — G8929 Other chronic pain: Secondary | ICD-10-CM | POA: Insufficient documentation

## 2015-09-11 DIAGNOSIS — Z8619 Personal history of other infectious and parasitic diseases: Secondary | ICD-10-CM | POA: Insufficient documentation

## 2015-09-11 DIAGNOSIS — Z79899 Other long term (current) drug therapy: Secondary | ICD-10-CM | POA: Diagnosis not present

## 2015-09-11 DIAGNOSIS — F410 Panic disorder [episodic paroxysmal anxiety] without agoraphobia: Secondary | ICD-10-CM | POA: Diagnosis not present

## 2015-09-11 DIAGNOSIS — Z7982 Long term (current) use of aspirin: Secondary | ICD-10-CM | POA: Diagnosis not present

## 2015-09-11 DIAGNOSIS — R42 Dizziness and giddiness: Secondary | ICD-10-CM | POA: Diagnosis present

## 2015-09-11 NOTE — Discharge Instructions (Signed)

## 2015-09-11 NOTE — ED Notes (Signed)
EDP aware. Not a LNW, states this has been going on for a while.

## 2015-09-11 NOTE — ED Notes (Signed)
Pt in EMS from home. Reports lightheadedness/dizziness. Blurry vision. R sided numbness/pain. All S/S have resolved. Also reported some L sided CP. Took 8 81mg  ASA. CBG 140

## 2015-09-11 NOTE — ED Provider Notes (Signed)
CSN: [id number]     Arrival date & time 09/11/15  1903 History   First MD Initiated Contact with Patient 09/11/15 1941     Chief Complaint  Patient presents with  . Numbness  . Dizziness     (Consider location/radiation/quality/duration/timing/severity/associated sxs/prior Treatment) HPI   [name] is a 60 y.o. male who presents for evaluation of an episode of dizziness, associated with a numbness in his right chest, and blurred vision. This occurred over a period of 45 minutes and seemed to wax and wane, getting worse when he was ambulating and improving when he was sitting. He was concerned about a heart attack or stroke, so took 4 baby aspirin. He then called EMS who advised him to take 4 more which he did. He was transferred here for evaluation. He reports his blood pressure was [id number] systolic at the time EMS arrived, and that usually he has normal blood pressure. He is been less active than usual, because his back has been hurting for the last several weeks. Today he did more than usual, getting out of the house and doing some things in his home, which was different than usual. No history of TIA, stroke or cardiac disease. No recent fever, chills, nausea, vomiting, cough or shortness of breath. There are no other known modifying factors.  Past Medical History  Diagnosis Date  . Hypertension   . Bipolar disorder ([place])   . Allergy   . Depression   . Chronic pain     lower back pain, "that has progressed"  . Panic attack   . Dyspnea   . Sleep apnea     no cpap used,still has machine(use rarely)  . Hepatitis C     hx. of"states he no longer has"" is clear".  . History of recreational drug use ([place])     "cocaine, marijuana, polysubstance" quit 1987.   Past Surgical History  Procedure Laterality Date  . Tonsillectomy    . Cardiac catheterization  2003    No CAD  . Sinus surgery    . Eus N/A 06/23/2014    Procedure: UPPER ENDOSCOPIC ULTRASOUND (EUS) LINEAR;  Surgeon: Milus Banister, MD;  Location: [facility];  Service: Endoscopy;  Laterality: N/A;   Family History  Problem Relation Age of Onset  . Heart disease Mother   . Kidney disease Mother   . Heart disease Father   . Breast cancer Maternal Grandmother   . Diabetes Maternal Grandfather    Social History  Substance Use Topics  . Smoking status: Former Smoker -- 2.00 packs/day for 16 years    Types: Cigarettes    Quit date: 04/08/2002  . Smokeless tobacco: Never Used  . Alcohol Use: No     Comment: Quit in 1987    Review of Systems  All other systems reviewed and are negative.     Allergies  Morphine and related  Home Medications   Prior to Admission medications   Medication Sig Start Date End Date Taking? Authorizing Provider  ALPRAZolam Duanne Moron) 1 MG tablet Take 0.25-1 mg by mouth 3 (three) times daily as needed for anxiety.  05/19/14   Historical Provider, MD  amoxicillin (AMOXIL) 500 MG capsule Take 2 capsules (1,000 mg total) by mouth 2 (two) times daily. 02/13/15   Vivi Barrack, MD  aspirin EC 81 MG tablet Take 81 mg by mouth daily.     Historical Provider, MD  baclofen (LIORESAL) 10 MG tablet Take 1 tablet (10 mg total) by  mouth at bedtime as needed for muscle spasms. 09/06/15   Olam Idler, MD  Buprenorphine (BUTRANS) 15 MCG/HR [place] onto the skin.    Historical Provider, MD  chlorhexidine (PERIDEX) 0.12 % solution Use as directed 15 mLs in the mouth or throat 3 (three) times daily.  09/06/14   Historical Provider, MD  cholecalciferol (VITAMIN D) 1000 UNITS tablet Take 2,000 Units by mouth daily.    Historical Provider, MD  clarithromycin (BIAXIN) 500 MG tablet Take 1 tablet (500 mg total) by mouth 2 (two) times daily. 02/13/15   Vivi Barrack, MD  Cyanocobalamin (VITAMIN B-12 PO) Take 1 tablet by mouth daily.    Historical Provider, MD  Dentifrices (BIOTENE DRY MOUTH) GEL Apply to tongue as needed for dry mouth. 05/26/15   Vivi Barrack, MD  Glucosamine-Chondroitin  (GLUCOSAMINE CHONDR COMPLEX PO) Take 1 tablet by mouth daily.    Historical Provider, MD  GRAPE SEED EXTRACT PO Take 2 capsules by mouth daily.    Historical Provider, MD  ibuprofen (ADVIL,MOTRIN) 600 MG tablet Take 1 tablet (600 mg total) by mouth every 8 (eight) hours as needed. 09/06/15   Olam Idler, MD  lamoTRIgine (LAMICTAL) 200 MG tablet Take 200 mg by mouth at bedtime.  07/30/13   Historical Provider, MD  mirtazapine (REMERON) 45 MG tablet Take 45 mg by mouth at bedtime.    Historical Provider, MD  Multiple Vitamin (MULTIVITAMIN WITH MINERALS) TABS Take 1 tablet by mouth daily.     Historical Provider, MD  ondansetron (ZOFRAN) 4 MG tablet Take 1 tablet (4 mg total) by mouth every 8 (eight) hours as needed for nausea or vomiting. 12/05/14   [name], MD  pantoprazole (PROTONIX) 40 MG tablet Take 1 tablet (40 mg total) by mouth 2 (two) times daily. 02/13/15   Vivi Barrack, MD  polyethylene glycol powder (GLYCOLAX/MIRALAX) powder Take 17 g by mouth daily as needed (constipation). 12/07/14   [name], MD  TURMERIC PO Take 1 tablet by mouth daily.    Historical Provider, MD   BP 135/87 mmHg  Pulse 65  Temp(Src) 97.9 F (36.6 C) (Oral)  Resp 12  Ht 5\' 7"  (1.702 m)  Wt 154 lb (69.854 kg)  BMI 24.11 kg/m2  SpO2 97% Physical Exam  Constitutional: He is oriented to person, place, and time. He appears well-developed and well-nourished.  HENT:  Head: Normocephalic and atraumatic.  Right Ear: External ear normal.  Left Ear: External ear normal.  Eyes: Conjunctivae and EOM are normal. Pupils are equal, round, and reactive to light.  Neck: Normal range of motion and phonation normal. Neck supple.  Cardiovascular: Normal rate, regular rhythm and normal heart sounds.   Pulmonary/Chest: Effort normal and breath sounds normal. He exhibits no bony tenderness.  Abdominal: Soft. There is no tenderness.  Musculoskeletal: Normal range of motion.  Neurological: He is alert and  oriented to person, place, and time. No cranial nerve deficit or sensory deficit. He exhibits normal muscle tone. Coordination normal.  No dysarthria and aphasia or nystagmus. No pronator drift.  Skin: Skin is warm, dry and intact.  Psychiatric: He has a normal mood and affect. His behavior is normal. Judgment and thought content normal.  Nursing note and vitals reviewed.   ED Course  Procedures (including critical care time) Labs Review Labs Reviewed - No data to display  Imaging Review No results found. I have personally reviewed and evaluated these images and lab results as part of my  medical decision-making.   EKG Interpretation   Date/Time:  Monday September 11 2015 19:11:05 EDT Ventricular Rate:  69 PR Interval:  160 QRS Duration: 100 QT Interval:  383 QTC Calculation: 410 R Axis:   102 Text Interpretation:  Sinus rhythm Left posterior fascicular block since  last tracing no significant change Confirmed by Eulis Foster  MD, Vira Agar [id number])  on 09/11/2015 8:19:59 PM      MDM   Final diagnoses:  Dizziness    Nonspecific transient dizziness, with atypical symptoms, for TIA or CVA. Symptoms transient and have occurred previously, at various times. Doubt TIA or CVA, ACS or metabolic instability.   Nursing Notes Reviewed/ Care Coordinated Applicable Imaging Reviewed Interpretation of Laboratory Data incorporated into ED treatment  The patient appears reasonably screened and/or stabilized for discharge and I doubt any other medical condition or other [facility] requiring further screening, evaluation, or treatment in the ED at this time prior to discharge.  Plan: Home Medications- usual; Home Treatments- rest; return here if the recommended treatment, does not improve the symptoms; Recommended follow up- PCP prn     Daleen Bo, MD 09/11/15 2337

## 2015-09-18 ENCOUNTER — Other Ambulatory Visit: Payer: Self-pay | Admitting: Family Medicine

## 2015-09-18 ENCOUNTER — Ambulatory Visit
Admission: RE | Admit: 2015-09-18 | Discharge: 2015-09-18 | Disposition: A | Discharge status: Home / Self Care | Payer: Medicare Other | Source: Ambulatory Visit | Attending: Family Medicine | Admitting: Family Medicine

## 2015-09-18 ENCOUNTER — Other Ambulatory Visit: Payer: Medicare Other

## 2015-09-18 DIAGNOSIS — M545 Low back pain, unspecified: Secondary | ICD-10-CM

## 2015-09-18 DIAGNOSIS — G8929 Other chronic pain: Secondary | ICD-10-CM

## 2015-09-19 ENCOUNTER — Telehealth: Payer: Self-pay | Admitting: Family Medicine

## 2015-09-19 LAB — TESTOSTERONE: Testosterone: 238 ng/dL — ABNORMAL LOW (ref 250–827)

## 2015-09-19 NOTE — Telephone Encounter (Signed)
Would like xray and blood work results from yesterday

## 2015-09-19 NOTE — Telephone Encounter (Signed)
Will forward to MD.  

## 2015-09-19 NOTE — Telephone Encounter (Signed)
Called and discussed results of recent x-rays that showed bilateral L3 pars fractures.  He continues to report lower back pain that is minimally improved from his visit 2 weeks ago.  Denies lower extremity weakness or numbness.  - Recommended he come in the next 1-2 weeks to discuss his ongoing back pain and consideration of additional imaging and therapies - If pain persists or develops neurovascular symptoms, would recommend lumbar MRI for further evaluation - Consider DEXA scan given history of liver dysfunction, tobacco abuse and chronic opioid use - Consider calcitonin if pain persist a follow-up as well as Mobic / Celecoxib (given GI issues) - He will follow-up with his pain management doctor for possibly increased opioids for the short-term given these new fractures

## 2015-09-26 ENCOUNTER — Emergency Department (HOSPITAL_COMMUNITY)
Admission: EM | Admit: 2015-09-26 | Discharge: 2015-09-26 | Disposition: A | Discharge status: Home / Self Care | Payer: Medicare Other | Attending: Emergency Medicine | Admitting: Emergency Medicine

## 2015-09-26 ENCOUNTER — Encounter (HOSPITAL_COMMUNITY): Payer: Self-pay

## 2015-09-26 DIAGNOSIS — F319 Bipolar disorder, unspecified: Secondary | ICD-10-CM | POA: Diagnosis not present

## 2015-09-26 DIAGNOSIS — Z79899 Other long term (current) drug therapy: Secondary | ICD-10-CM | POA: Insufficient documentation

## 2015-09-26 DIAGNOSIS — M545 Low back pain: Secondary | ICD-10-CM | POA: Insufficient documentation

## 2015-09-26 DIAGNOSIS — Z87891 Personal history of nicotine dependence: Secondary | ICD-10-CM | POA: Insufficient documentation

## 2015-09-26 DIAGNOSIS — Z7982 Long term (current) use of aspirin: Secondary | ICD-10-CM | POA: Diagnosis not present

## 2015-09-26 DIAGNOSIS — I10 Essential (primary) hypertension: Secondary | ICD-10-CM | POA: Insufficient documentation

## 2015-09-26 DIAGNOSIS — M549 Dorsalgia, unspecified: Secondary | ICD-10-CM

## 2015-09-26 DIAGNOSIS — G8929 Other chronic pain: Secondary | ICD-10-CM | POA: Diagnosis not present

## 2015-09-26 MED ORDER — BACLOFEN 10 MG PO TABS: 10.0000 mg | ORAL_TABLET | Freq: Every evening | ORAL | Status: DC | PRN

## 2015-09-26 MED ORDER — KETOROLAC TROMETHAMINE 30 MG/ML IJ SOLN
30.0000 mg | Freq: Once | INTRAMUSCULAR | Status: AC
  Administered 2015-09-26: 30 mg via INTRAMUSCULAR
  Filled 2015-09-26: qty 1

## 2015-09-26 NOTE — Discharge Instructions (Signed)
Please read and follow all provided instructions.  Your diagnoses today include:  1. Chronic back pain   2. Chronic bilateral low back pain without sciatica    Tests performed today include:  Vital signs - see below for your results today  Medications prescribed:   Take any prescribed medications only as directed.  Home care instructions:   Follow any educational materials contained in this packet  Please rest, use ice or heat on your back for the next several days  Do not lift, push, pull anything more than 10 pounds for the next week  Follow-up instructions: Please follow-up with your primary care provider for further evaluation of your symptoms and possible MRI   Return instructions:  SEEK IMMEDIATE MEDICAL ATTENTION IF YOU HAVE:  New numbness, tingling, weakness, or problem with the use of your arms or legs  Severe back pain not relieved with medications  Loss control of your bowels or bladder  Increasing pain in any areas of the body (such as chest or abdominal pain)  Shortness of breath, dizziness, or fainting.   Worsening nausea (feeling sick to your stomach), vomiting, fever, or sweats  Any other emergent concerns regarding your health   Additional Information:  Your vital signs today were: BP 136/94 mmHg   Pulse 74   Temp(Src) 97.9 F (36.6 C) (Oral)   Resp 18   SpO2 100% If your blood pressure (BP) was elevated above 135/85 this visit, please have this repeated by your doctor within one month. --------------

## 2015-09-26 NOTE — ED Provider Notes (Signed)
CSN: [id number]     Arrival date & time 09/26/15  1424 History   First MD Initiated Contact with Patient 09/26/15 1908     Chief Complaint  Patient presents with  . Back Pain   (Consider location/radiation/quality/duration/timing/severity/associated sxs/prior Treatment) HPI 60 y.o. male with a hx of Chronic Back Pain, HTN, Bipolar Disorder, presents to the Emergency Department today complaining of back pain x3-4 months. Noted lumbar back pain with gradual worsening over the past few months. No recent trauma to the area. No lifting that he knows of. Seen by PCP and Pain management clinic for symptoms. States pain is 8/10 and worse with movement. Pt is able to ambulate. No fevers. No loss of bowel or bladder function. No urinary symptoms. No saddle anesthesia. No CP/SOB/ABD pain. No N/V/D. No headaches. No vision changes. No other symptoms noted.       Past Medical History  Diagnosis Date  . Hypertension   . Bipolar disorder ([place])   . Allergy   . Depression   . Chronic pain     lower back pain, "that has progressed"  . Panic attack   . Dyspnea   . Sleep apnea     no cpap used,still has machine(use rarely)  . Hepatitis C     hx. of"states he no longer has"" is clear".  . History of recreational drug use ([place])     "cocaine, marijuana, polysubstance" quit 1987.   Past Surgical History  Procedure Laterality Date  . Tonsillectomy    . Cardiac catheterization  2003    No CAD  . Sinus surgery    . Eus N/A 06/23/2014    Procedure: UPPER ENDOSCOPIC ULTRASOUND (EUS) LINEAR;  Surgeon: Milus Banister, MD;  Location: [facility];  Service: Endoscopy;  Laterality: N/A;   Family History  Problem Relation Age of Onset  . Heart disease Mother   . Kidney disease Mother   . Heart disease Father   . Breast cancer Maternal Grandmother   . Diabetes Maternal Grandfather    Social History  Substance Use Topics  . Smoking status: Former Smoker -- 2.00 packs/day for 16 years    Types: Cigarettes     Quit date: 04/08/2002  . Smokeless tobacco: Never Used  . Alcohol Use: No     Comment: Quit in 1987    Review of Systems ROS reviewed and all are negative for acute change except as noted in the HPI.  Allergies  Morphine and related  Home Medications   Prior to Admission medications   Medication Sig Start Date End Date Taking? Authorizing Provider  ALPRAZolam Duanne Moron) 0.5 MG tablet Take 0.5 mg by mouth 3 (three) times daily as needed for anxiety.   Yes Historical Provider, MD  aspirin EC 81 MG tablet Take 81 mg by mouth every 6 (six) hours as needed for mild pain.    Yes Historical Provider, MD  baclofen (LIORESAL) 10 MG tablet Take 1 tablet (10 mg total) by mouth at bedtime as needed for muscle spasms. 09/06/15  Yes Olam Idler, MD  cholecalciferol (VITAMIN D) 1000 UNITS tablet Take 2,000 Units by mouth daily.   Yes Historical Provider, MD  Cyanocobalamin (VITAMIN B-12 PO) Take 1 tablet by mouth daily.   Yes Historical Provider, MD  Glucosamine-Chondroitin (GLUCOSAMINE CHONDR COMPLEX PO) Take 1 tablet by mouth daily.   Yes Historical Provider, MD  GRAPE SEED EXTRACT PO Take 2 capsules by mouth daily.   Yes Historical Provider, MD  HYDROmorphone (DILAUDID) 2  MG tablet Take 2 mg by mouth every 4 (four) hours as needed for severe pain.  09/21/15  Yes Historical Provider, MD  lamoTRIgine (LAMICTAL) 200 MG tablet Take 200 mg by mouth at bedtime.  07/30/13  Yes Historical Provider, MD  mirtazapine (REMERON) 45 MG tablet Take 45 mg by mouth at bedtime.   Yes Historical Provider, MD  Multiple Vitamin (MULTIVITAMIN WITH MINERALS) TABS Take 1 tablet by mouth daily.    Yes Historical Provider, MD  TURMERIC PO Take 1 tablet by mouth daily.   Yes Historical Provider, MD  amoxicillin (AMOXIL) 500 MG capsule Take 2 capsules (1,000 mg total) by mouth 2 (two) times daily. Patient not taking: Reported on 09/26/2015 02/13/15   Vivi Barrack, MD  clarithromycin (BIAXIN) 500 MG tablet Take 1 tablet (500  mg total) by mouth 2 (two) times daily. Patient not taking: Reported on 09/26/2015 02/13/15   Vivi Barrack, MD  Dentifrices (BIOTENE DRY MOUTH) GEL Apply to tongue as needed for dry mouth. Patient not taking: Reported on 09/26/2015 05/26/15   Vivi Barrack, MD  ibuprofen (ADVIL,MOTRIN) 600 MG tablet Take 1 tablet (600 mg total) by mouth every 8 (eight) hours as needed. Patient not taking: Reported on 09/26/2015 09/06/15   Olam Idler, MD  ondansetron (ZOFRAN) 4 MG tablet Take 1 tablet (4 mg total) by mouth every 8 (eight) hours as needed for nausea or vomiting. Patient not taking: Reported on 09/26/2015 12/05/14   [name], MD  pantoprazole (PROTONIX) 40 MG tablet Take 1 tablet (40 mg total) by mouth 2 (two) times daily. Patient not taking: Reported on 09/26/2015 02/13/15   Vivi Barrack, MD  polyethylene glycol powder (GLYCOLAX/MIRALAX) powder Take 17 g by mouth daily as needed (constipation). Patient not taking: Reported on 09/26/2015 12/07/14   [name], MD   BP 136/94 mmHg  Pulse 74  Temp(Src) 97.9 F (36.6 C) (Oral)  Resp 18  SpO2 100%   Physical Exam  Constitutional: He is oriented to person, place, and time. He appears well-developed and well-nourished.  HENT:  Head: Normocephalic and atraumatic.  Eyes: EOM are normal. Pupils are equal, round, and reactive to light.  Neck: Normal range of motion. Neck supple. No tracheal deviation present.  Cardiovascular: Normal rate, regular rhythm, normal heart sounds and intact distal pulses.   No murmur heard. Pulmonary/Chest: Effort normal and breath sounds normal. No respiratory distress. He has no wheezes. He has no rales.  Abdominal: Soft. There is no tenderness.  Musculoskeletal: Normal range of motion.       Cervical back: Normal.       Thoracic back: Normal.       Lumbar back: He exhibits tenderness, bony tenderness and pain. He exhibits normal range of motion, no swelling, no deformity and no laceration.  No CVA  tenderness. Pt able to ambulate. ROM with mild discomfort. No deformities or step offs palpated or visualized. BLE motor/sensation intact. DTR intact.   Neurological: He is alert and oriented to person, place, and time.  Skin: Skin is warm and dry.  Psychiatric: He has a normal mood and affect. His behavior is normal. Thought content normal.  Nursing note and vitals reviewed.  ED Course  Procedures (including critical care time) Labs Review Labs Reviewed - No data to display  Imaging Review No results found. I have personally reviewed and evaluated these images and lab results as part of my medical decision-making.   EKG Interpretation None  MDM  I have reviewed and evaluated the relevant laboratory values I have reviewed and evaluated the relevant imaging studies.   I have reviewed the relevant previous healthcare records. I obtained HPI from historian.  ED Course:  Assessment: Patient is a 39yM with a hx of chronic back pain who presents to the ED with worsening back pain. No trauma to area. No lifting injury. No neurological deficits appreciated. Patient is ambulatory. No warning symptoms of back pain including: fecal incontinence, urinary retention or overflow incontinence, night sweats, waking from sleep with back pain, unexplained fevers or weight loss, h/o cancer, IVDU, recent trauma. No concern for cauda equina, epidural abscess, or other serious cause of back pain. No indication for CT/MRI scan. Counsled patient on PCP follow up with outpatient MRI. Sent message to PCP. Conservative measures such as rest, ice/heat and pain medicine indicated with PCP follow-up if no improvement with conservative management. Given Toradol in ED with improvement. Given Rx Baclofen. At time of discharge, Patient is in no acute distress. Vital Signs are stable. Patient is able to ambulate. Patient able to tolerate PO.   Disposition/Plan:  DC Home Additional Verbal discharge instructions given  and discussed with patient.  Pt Instructed to f/u with PCP in the next week for evaluation and treatment of symptoms. Return precautions given Pt acknowledges and agrees with plan  Supervising Physician Orlie Dakin, MD   Final diagnoses:  Chronic back pain     Shary Decamp, PA-C 09/26/15 2026  Orlie Dakin, MD 09/26/15 2245

## 2015-09-26 NOTE — ED Notes (Addendum)
Chronic back pain.  Increasing in last 3-4 months.  Pt states pain last few days increased.  Pt is pain clinic patient

## 2015-09-27 ENCOUNTER — Encounter: Payer: Self-pay | Admitting: Family Medicine

## 2015-09-28 ENCOUNTER — Other Ambulatory Visit: Payer: Self-pay | Admitting: Family Medicine

## 2015-09-28 DIAGNOSIS — M5440 Lumbago with sciatica, unspecified side: Secondary | ICD-10-CM

## 2015-09-29 ENCOUNTER — Other Ambulatory Visit: Payer: Self-pay | Admitting: Anesthesiology

## 2015-09-29 DIAGNOSIS — R1013 Epigastric pain: Secondary | ICD-10-CM

## 2015-10-02 ENCOUNTER — Other Ambulatory Visit: Payer: Self-pay | Admitting: *Deleted

## 2015-10-02 DIAGNOSIS — M5440 Lumbago with sciatica, unspecified side: Secondary | ICD-10-CM

## 2015-10-02 NOTE — Progress Notes (Signed)
Received fax from [facility] requesting a new order for MRI lumbar w/wo contrast.  Ok per MD and reordered.  Will print order and fax to GI. Jazmin Hartsell,CMA

## 2015-10-04 ENCOUNTER — Ambulatory Visit
Admission: RE | Admit: 2015-10-04 | Discharge: 2015-10-04 | Disposition: A | Discharge status: Home / Self Care | Payer: Medicare Other | Source: Ambulatory Visit | Attending: Anesthesiology | Admitting: Anesthesiology

## 2015-10-04 DIAGNOSIS — R1013 Epigastric pain: Secondary | ICD-10-CM

## 2015-10-08 ENCOUNTER — Ambulatory Visit
Admission: RE | Admit: 2015-10-08 | Discharge: 2015-10-08 | Disposition: A | Discharge status: Home / Self Care | Payer: Medicare Other | Source: Ambulatory Visit | Attending: Family Medicine | Admitting: Family Medicine

## 2015-10-08 DIAGNOSIS — M5440 Lumbago with sciatica, unspecified side: Secondary | ICD-10-CM

## 2015-10-08 MED ORDER — GADOBENATE DIMEGLUMINE 529 MG/ML IV SOLN
14.0000 mL | Freq: Once | INTRAVENOUS | Status: AC | PRN
  Administered 2015-10-08: 14 mL via INTRAVENOUS

## 2015-10-13 ENCOUNTER — Telehealth: Payer: Self-pay | Admitting: *Deleted

## 2015-10-13 NOTE — Telephone Encounter (Signed)
Patient calling requesting result of MRI done last week.

## 2015-10-18 ENCOUNTER — Telehealth: Payer: Self-pay | Admitting: Internal Medicine

## 2015-10-18 NOTE — Telephone Encounter (Signed)
Please let Mr. Ansell know that we have sent his information over to the spine specialist and their office will be calling him to schedule an appointment. Thank you!

## 2015-10-18 NOTE — Telephone Encounter (Signed)
Pt informed of referral sent to a Spine Specialist. Page, cma.

## 2015-10-18 NOTE — Telephone Encounter (Signed)
Pt is calling because he was supposed to be referred to a spine specialist and was wanting to know what the status of the referral was. jw

## 2015-11-24 ENCOUNTER — Telehealth: Payer: Self-pay | Admitting: Gastroenterology

## 2015-11-24 NOTE — Telephone Encounter (Signed)
Left message for patient to call back  

## 2015-11-24 NOTE — Telephone Encounter (Signed)
Patient with a history of chronic pancreatitis.  He is having epigastric pain and bloating.  He wil come in and see Ellouise Newer, [place] on 11/30/15

## 2015-11-27 ENCOUNTER — Encounter: Payer: Self-pay | Admitting: Gastroenterology

## 2015-11-28 ENCOUNTER — Other Ambulatory Visit: Payer: Self-pay | Admitting: Family Medicine

## 2015-11-28 DIAGNOSIS — R110 Nausea: Secondary | ICD-10-CM

## 2015-11-28 DIAGNOSIS — R1084 Generalized abdominal pain: Secondary | ICD-10-CM

## 2015-11-30 ENCOUNTER — Ambulatory Visit: Payer: Medicare Other | Admitting: *Deleted

## 2015-11-30 ENCOUNTER — Ambulatory Visit: Payer: Medicare Other | Admitting: Physician Assistant

## 2015-12-14 ENCOUNTER — Ambulatory Visit (HOSPITAL_COMMUNITY): Payer: Medicare Other

## 2015-12-19 ENCOUNTER — Other Ambulatory Visit: Payer: Medicare Other

## 2015-12-21 ENCOUNTER — Ambulatory Visit
Admission: RE | Admit: 2015-12-21 | Discharge: 2015-12-21 | Disposition: A | Discharge status: Home / Self Care | Payer: Medicare Other | Source: Ambulatory Visit | Attending: Family Medicine | Admitting: Family Medicine

## 2015-12-21 DIAGNOSIS — R1084 Generalized abdominal pain: Secondary | ICD-10-CM

## 2015-12-21 DIAGNOSIS — R110 Nausea: Secondary | ICD-10-CM

## 2015-12-21 MED ORDER — IOPAMIDOL (ISOVUE-300) INJECTION 61%
100.0000 mL | Freq: Once | INTRAVENOUS | Status: AC | PRN
  Administered 2015-12-21: 100 mL via INTRAVENOUS

## 2015-12-29 ENCOUNTER — Ambulatory Visit: Payer: Medicare Other | Admitting: Gastroenterology

## 2016-01-15 ENCOUNTER — Other Ambulatory Visit (HOSPITAL_COMMUNITY): Payer: Self-pay | Admitting: Psychiatry

## 2016-01-18 ENCOUNTER — Other Ambulatory Visit (HOSPITAL_COMMUNITY): Payer: Self-pay | Admitting: Psychiatry

## 2016-02-12 ENCOUNTER — Telehealth: Payer: Self-pay | Admitting: Internal Medicine

## 2016-02-12 NOTE — Telephone Encounter (Signed)
AWV 02/22/16 AT 2:30pm

## 2016-02-22 ENCOUNTER — Telehealth: Payer: Self-pay | Admitting: *Deleted

## 2016-02-22 ENCOUNTER — Ambulatory Visit: Payer: Medicare Other | Admitting: *Deleted

## 2016-02-22 NOTE — Telephone Encounter (Signed)
Patient arrived at [facility] for AWV but could not find parking in [facility] lot. Had to park in the back row across the street at [place]. By the time the patient walked to [facility] and checked in he was unable to stay for appt due to back pain and SHOB. Patient declined SD appt. Declined being wheeled to car in wheel chair. This RN walked with patient to [facility] and waited for patient to reach car and signal that he was ok to drive home. Called patient to ensure patient made it home safely. Offered to mail gift card to patient for the trouble he went through. Patient stated, "You've all been so good to me, you don't need to do that. Give it to someone who needs it more." Thanked patient for his kind words but gas card still mailed to patient. Verified home address. Voucher completed and given to Midwife. Hubbard Hartshorn, RN, BSN

## 2016-03-04 ENCOUNTER — Ambulatory Visit (INDEPENDENT_AMBULATORY_CARE_PROVIDER_SITE_OTHER): Payer: Medicare Other | Admitting: *Deleted

## 2016-03-04 ENCOUNTER — Encounter: Payer: Self-pay | Admitting: *Deleted

## 2016-03-04 ENCOUNTER — Ambulatory Visit (INDEPENDENT_AMBULATORY_CARE_PROVIDER_SITE_OTHER): Payer: Medicare Other | Admitting: Internal Medicine

## 2016-03-04 VITALS — BP 113/80 | HR 72 | Temp 98.1°F | Ht 67.0 in | Wt 158.8 lb

## 2016-03-04 DIAGNOSIS — Z0000 Encounter for general adult medical examination without abnormal findings: Secondary | ICD-10-CM | POA: Diagnosis not present

## 2016-03-04 DIAGNOSIS — R5383 Other fatigue: Secondary | ICD-10-CM

## 2016-03-04 NOTE — Progress Notes (Signed)
Subjective:   [name] is a 60 y.o. male who presents for an Initial Medicare Annual Wellness Visit.  Cardiac Risk Factors include: advanced age (>52mn, >>81women);dyslipidemia;hypertension;male gender;smoking/ tobacco exposure    Objective:    Today's Vitals   03/04/16 1456  PainSc: 4    Vitals:   03/04/16 1408  BP: 113/80  Pulse: 72  Temp: 98.1 F (36.7 C)  SpO2 97% Weight 158 lbs 12.8 oz Height 5'7 BMI 24.87  Current Medications (verified) Outpatient Encounter Prescriptions as of 03/04/2016  Medication Sig  . ALPRAZolam (XANAX) 0.5 MG tablet Take 0.5 mg by mouth 3 (three) times daily as needed for anxiety.  .Marland Kitchenaspirin EC 81 MG tablet Take 81 mg by mouth every 6 (six) hours as needed for mild pain.   . baclofen (LIORESAL) 10 MG tablet Take 1 tablet (10 mg total) by mouth at bedtime as needed for muscle spasms.  . cholecalciferol (VITAMIN D) 1000 UNITS tablet Take 2,000 Units by mouth daily.  . Cyanocobalamin (VITAMIN B-12 PO) Take 1 tablet by mouth daily.  . Glucosamine-Chondroitin (GLUCOSAMINE CHONDR COMPLEX PO) Take 1 tablet by mouth daily.  .Marland KitchenHYDROmorphone (DILAUDID) 2 MG tablet Take 2 mg by mouth every 4 (four) hours as needed for severe pain.   .Marland KitchenlamoTRIgine (LAMICTAL) 200 MG tablet Take 200 mg by mouth at bedtime.   . mirtazapine (REMERON) 45 MG tablet Take 45 mg by mouth at bedtime.  . Multiple Vitamin (MULTIVITAMIN WITH MINERALS) TABS Take 1 tablet by mouth daily.   . TURMERIC PO Take 1 tablet by mouth daily.  . Dentifrices (BIOTENE DRY MOUTH) GEL Apply to tongue as needed for dry mouth. (Patient not taking: Reported on 03/04/2016)  . GRAPE SEED EXTRACT PO Take 2 capsules by mouth daily.  .Marland Kitchenibuprofen (ADVIL,MOTRIN) 600 MG tablet Take 1 tablet (600 mg total) by mouth every 8 (eight) hours as needed. (Patient not taking: Reported on 03/04/2016)  . ondansetron (ZOFRAN) 4 MG tablet Take 1 tablet (4 mg total) by mouth every 8 (eight) hours as needed for nausea  or vomiting. (Patient not taking: Reported on 03/04/2016)  . pantoprazole (PROTONIX) 40 MG tablet Take 1 tablet (40 mg total) by mouth 2 (two) times daily. (Patient not taking: Reported on 03/04/2016)  . polyethylene glycol powder (GLYCOLAX/MIRALAX) powder Take 17 g by mouth daily as needed (constipation). (Patient not taking: Reported on 03/04/2016)  . [DISCONTINUED] amoxicillin (AMOXIL) 500 MG capsule Take 2 capsules (1,000 mg total) by mouth 2 (two) times daily. (Patient not taking: Reported on 03/04/2016)  . [DISCONTINUED] clarithromycin (BIAXIN) 500 MG tablet Take 1 tablet (500 mg total) by mouth 2 (two) times daily. (Patient not taking: Reported on 03/04/2016)   Facility-Administered Encounter Medications as of 03/04/2016  Medication  . 0.9 %  sodium chloride infusion    Allergies (verified) Morphine and related   History: Past Medical History:  Diagnosis Date  . Alcohol abuse   . Allergy   . Bipolar disorder ([place]   . Chronic pain    lower back pain, "that has progressed"  . Depression   . Diverticulosis   . Dyspnea   . Hepatitis C    hx. of"states he no longer has"" is clear".  . History of recreational drug use    "cocaine, marijuana, polysubstance" quit 1987.  .Marland KitchenHLD (hyperlipidemia)   . Hypertension   . Panic attack   . Sleep apnea    no cpap used,still has machine(use rarely)  Past Surgical History:  Procedure Laterality Date  . CARDIAC CATHETERIZATION  2003   No CAD  . EUS N/A 06/23/2014   Procedure: UPPER ENDOSCOPIC ULTRASOUND (EUS) LINEAR;  Surgeon: Milus Banister, MD;  Location: [facility];  Service: Endoscopy;  Laterality: N/A;  . sinus surgery    . TONSILLECTOMY     Family History  Problem Relation Age of Onset  . Heart disease Mother   . Kidney disease Mother   . COPD Mother   . Lupus Mother   . Heart disease Father   . Breast cancer Maternal Grandmother   . Diabetes Maternal Grandfather    Social History   Occupational History  . DISABLED    .      Social History Main Topics  . Smoking status: Former Smoker    Packs/day: 2.00    Years: 16.00    Types: Cigarettes    Quit date: 04/08/2002  . Smokeless tobacco: Never Used  . Alcohol use No     Comment: Quit in 1987  . Drug use: No     Comment: Use to take cocaine, marijuana, per patient everything. Stopped in 1987.  Marland Kitchen Sexual activity: Not Currently    Partners: Female   Tobacco Counseling Patient is former smoker with no plans to restart  Activities of Daily Living In your present state of health, do you have any difficulty performing the following activities: 03/04/2016  Hearing? N  Vision? N  Difficulty concentrating or making decisions? Y  Walking or climbing stairs? Y  Dressing or bathing? Y  Doing errands, shopping? Y  Preparing Food and eating ? N  Using the Toilet? N  In the past six months, have you accidently leaked urine? N  Do you have problems with loss of bowel control? N  Managing your Medications? N  Managing your Finances? Y  Housekeeping or managing your Housekeeping? Y  Some recent data might be hidden  Home Safety:  My home has a working smoke alarm:  Yes           My home throw rugs have been fastened down to the floor or removed:  Removed I have non-slip mats in the bathtub and shower:  Yes         All my home's stairs have railings or bannisters: One level home with no outside stairs.           My home's floors, stairs and hallways are free from clutter, wires and cords:  Yes     I wear seatbelts consistently:  Yes    Immunizations and Health Maintenance Immunization History  Administered Date(s) Administered  . H1N1 04/05/2008  . Influenza Split 06/10/2012  . Influenza Whole 02/02/2007, 04/05/2008, 02/27/2009, 12/18/2010  . Influenza,inj,Quad PF,36+ Mos 01/04/2014, 12/21/2014  . Influenza-Unspecified 02/06/2013, 01/06/2016  . Pneumococcal Polysaccharide-23 08/25/2014   There are no preventive care reminders to display for this  patient.  Patient Care Team: Sela Hua, MD as PCP - General (Family Medicine) Norma Fredrickson, MD as Consulting Physician (Psychiatry) Dr Hardin Negus for Pain Management [facility] Harolyn Rutherford and [place] any recent Medical Services you may have received from other than [facility] providers in the past year (date may be approximate).    Assessment:   This is a routine wellness examination for [facility].   Hearing/Vision screen  Hearing Screening   Method: Audiometry   _0  _1  _2  _3  _4  _5  _6  _7  _8   Right ear:   25 25  25  25    Left ear:   _0 Dietary issues and exercise activities discussed: Current Exercise Habits: Home exercise routine;Structured exercise class [facility]), Type of exercise: yoga;stretching;strength training/weights (Tai Chi), Time (Minutes): 60, Frequency (Times/Week): 7, Weekly Exercise (Minutes/Week): 420, Intensity: Mild  Goals    . Blood Pressure < 140/90      Depression Screen PHQ 2/9 Scores 03/04/2016 03/04/2016 03/20/2015 02/13/2015  PHQ - 2 Score 1 0 0 1  PHQ- 9 Score - - - -    Fall Risk Fall Risk  03/04/2016 03/04/2016 09/06/2015 02/16/2013  Falls in the past year? Yes No No No  Number falls in past yr: 2 or more - - -  Injury with Fall? No - - -  Risk Factor Category  High Fall Risk - - -  Risk for fall due to : Impaired mobility;Impaired balance/gait;History of fall(s) - - -  Follow up Education provided;Falls prevention discussed - - -  Patient has dog that has tripped him up and caused falls. Falls prevention discussed and literature given. PCP notified of fall risk status.  TUG Test:  Done in 10 seconds. Patient used both hands to push out of chair and to sit back down.  Cognitive Function: Mini-Cog  Passed with score 3/5   Cognitive Function: MMSE - Mini Mental State Exam 02/28/2014  Orientation to time 5  Orientation to Place 5  Registration 3    Attention/ Calculation 5  Recall 3  Language- name 2 objects 2  Language- repeat 1  Language- follow 3 step command 2  Language- read & follow direction 1  Write a sentence 1  Copy design 1  Total score 29       Screening Tests Health Maintenance  Topic Date Due  . TETANUS/TDAP  08/11/2016  . COLONOSCOPY  05/30/2024  . INFLUENZA VACCINE  Completed  . Hepatitis C Screening  Completed  . HIV Screening  Completed        Plan:     During the course of the visit [name] was educated and counseled about the following appropriate screening and preventive services:   Vaccines to include Pneumoccal, Influenza, Td, Zostavax  Colorectal cancer screening  Cardiovascular disease screening  Diabetes screening  Nutrition counseling  Patient Instructions (the written plan) were given to the patient.   Velora Heckler, RN   03/04/2016

## 2016-03-04 NOTE — Progress Notes (Signed)
Patient arrived reporting concern for fatigue and recent SOB. However, stated that he believed it was related to stress of his ill mother. Reports he is followed by [facility] and sees a therapist. Denied SI. Denied chest pain and current SOB.   Discussed with patient that we could do some labs related to fatigue. Patient denied these labs and said "they would be a waste of time". Asked patient if he would like to see behavioral health today and he said there was nothing to talk about.   Asked patient what he was hoping a provider could do for him today. He apologized for wasting my time and said that he was hoping to have his Medicare Annual Wellness exam done today. Discussed with Ander Purpura, RN who was able to see patient today. Instructed patient to follow up with PCP and return precautions reviewed.   Phill Myron, D.O. 03/04/2016, 2:54 PM PGY-2, [place]

## 2016-03-04 NOTE — Patient Instructions (Signed)
Fall Prevention in the Home Introduction Falls can cause injuries. They can happen to people of all ages. There are many things you can do to make your home safe and to help prevent falls. What can I do on the outside of my home?  Regularly fix the edges of walkways and driveways and fix any cracks.  Remove anything that might make you trip as you walk through a door, such as a raised step or threshold.  Trim any bushes or trees on the path to your home.  Use bright outdoor lighting.  Clear any walking paths of anything that might make someone trip, such as rocks or tools.  Regularly check to see if handrails are loose or broken. Make sure that both sides of any steps have handrails.  Any raised decks and porches should have guardrails on the edges.  Have any leaves, snow, or ice cleared regularly.  Use sand or salt on walking paths during winter.  Clean up any spills in your garage right away. This includes oil or grease spills. What can I do in the bathroom?  Use night lights.  Install grab bars by the toilet and in the tub and shower. Do not use towel bars as grab bars.  Use non-skid mats or decals in the tub or shower.  If you need to sit down in the shower, use a plastic, non-slip stool.  Keep the floor dry. Clean up any water that spills on the floor as soon as it happens.  Remove soap buildup in the tub or shower regularly.  Attach bath mats securely with double-sided non-slip rug tape.  Do not have throw rugs and other things on the floor that can make you trip. What can I do in the bedroom?  Use night lights.  Make sure that you have a light by your bed that is easy to reach.  Do not use any sheets or blankets that are too big for your bed. They should not hang down onto the floor.  Have a firm chair that has side arms. You can use this for support while you get dressed.  Do not have throw rugs and other things on the floor that can make you trip. What  can I do in the kitchen?  Clean up any spills right away.  Avoid walking on wet floors.  Keep items that you use a lot in easy-to-reach places.  If you need to reach something above you, use a strong step stool that has a grab bar.  Keep electrical cords out of the way.  Do not use floor polish or wax that makes floors slippery. If you must use wax, use non-skid floor wax.  Do not have throw rugs and other things on the floor that can make you trip. What can I do with my stairs?  Do not leave any items on the stairs.  Make sure that there are handrails on both sides of the stairs and use them. Fix handrails that are broken or loose. Make sure that handrails are as long as the stairways.  Check any carpeting to make sure that it is firmly attached to the stairs. Fix any carpet that is loose or worn.  Avoid having throw rugs at the top or bottom of the stairs. If you do have throw rugs, attach them to the floor with carpet tape.  Make sure that you have a light switch at the top of the stairs and the bottom of the stairs. If  you do not have them, ask someone to add them for you. What else can I do to help prevent falls?  Wear shoes that:  Do not have high heels.  Have rubber bottoms.  Are comfortable and fit you well.  Are closed at the toe. Do not wear sandals.  If you use a stepladder:  Make sure that it is fully opened. Do not climb a closed stepladder.  Make sure that both sides of the stepladder are locked into place.  Ask someone to hold it for you, if possible.  Clearly mark and make sure that you can see:  Any grab bars or handrails.  First and last steps.  Where the edge of each step is.  Use tools that help you move around (mobility aids) if they are needed. These include:  Canes.  Walkers.  Scooters.  Crutches.  Turn on the lights when you go into a dark area. Replace any light bulbs as soon as they burn out.  Set up your furniture so you  have a clear path. Avoid moving your furniture around.  If any of your floors are uneven, fix them.  If there are any pets around you, be aware of where they are.  Review your medicines with your doctor. Some medicines can make you feel dizzy. This can increase your chance of falling. Ask your doctor what other things that you can do to help prevent falls. This information is not intended to replace advice given to you by your health care provider. Make sure you discuss any questions you have with your health care provider. Document Released: 01/19/2009 Document Revised: 08/31/2015 Document Reviewed: 04/29/2014  2017 Elsevier  Health Maintenance, Male A healthy lifestyle and preventative care can promote health and wellness.  Maintain regular health, dental, and eye exams.  Eat a healthy diet. Foods like vegetables, fruits, whole grains, low-fat dairy products, and lean protein foods contain the nutrients you need and are low in calories. Decrease your intake of foods high in solid fats, added sugars, and salt. Get information about a proper diet from your health care provider, if necessary.  Regular physical exercise is one of the most important things you can do for your health. Most adults should get at least 150 minutes of moderate-intensity exercise (any activity that increases your heart rate and causes you to sweat) each week. In addition, most adults need muscle-strengthening exercises on 2 or more days a week.   Maintain a healthy weight. The body mass index (BMI) is a screening tool to identify possible weight problems. It provides an estimate of body fat based on height and weight. Your health care provider can find your BMI and can help you achieve or maintain a healthy weight. For males 20 years and older:  A BMI below 18.5 is considered underweight.  A BMI of 18.5 to 24.9 is normal.  A BMI of 25 to 29.9 is considered overweight.  A BMI of 30 and above is considered  obese.  Maintain normal blood lipids and cholesterol by exercising and minimizing your intake of saturated fat. Eat a balanced diet with plenty of fruits and vegetables. Blood tests for lipids and cholesterol should begin at age 19 and be repeated every 5 years. If your lipid or cholesterol levels are high, you are over age 40, or you are at high risk for heart disease, you may need your cholesterol levels checked more frequently.Ongoing high lipid and cholesterol levels should be treated with medicines if diet  and exercise are not working.  If you smoke, find out from your health care provider how to quit. If you do not use tobacco, do not start.  Lung cancer screening is recommended for adults aged 60-80 years who are at high risk for developing lung cancer because of a history of smoking. A yearly low-dose CT scan of the lungs is recommended for people who have at least a 30-pack-year history of smoking and are current smokers or have quit within the past 15 years. A pack year of smoking is smoking an average of 1 pack of cigarettes a day for 1 year (for example, a 30-pack-year history of smoking could mean smoking 1 pack a day for 30 years or 2 packs a day for 15 years). Yearly screening should continue until the smoker has stopped smoking for at least 15 years. Yearly screening should be stopped for people who develop a health problem that would prevent them from having lung cancer treatment.  If you choose to drink alcohol, do not have more than 2 drinks per day. One drink is considered to be 12 oz (360 mL) of beer, 5 oz (150 mL) of wine, or 1.5 oz (45 mL) of liquor.  Avoid the use of street drugs. Do not share needles with anyone. Ask for help if you need support or instructions about stopping the use of drugs.  High blood pressure causes heart disease and increases the risk of stroke. High blood pressure is more likely to develop in:  People who have blood pressure in the end of the normal  range (100-139/85-89 mm Hg).  People who are overweight or obese.  People who are African American.  If you are 71-12 years of age, have your blood pressure checked every 3-5 years. If you are 56 years of age or older, have your blood pressure checked every year. You should have your blood pressure measured twice-once when you are at a hospital or clinic, and once when you are not at a hospital or clinic. Record the average of the two measurements. To check your blood pressure when you are not at a hospital or clinic, you can use:  An automated blood pressure machine at a pharmacy.  A home blood pressure monitor.  If you are 16-80 years old, ask your health care provider if you should take aspirin to prevent heart disease.  Diabetes screening involves taking a blood sample to check your fasting blood sugar level. This should be done once every 3 years after age 73 if you are at a normal weight and without risk factors for diabetes. Testing should be considered at a younger age or be carried out more frequently if you are overweight and have at least 1 risk factor for diabetes.  Colorectal cancer can be detected and often prevented. Most routine colorectal cancer screening begins at the age of 64 and continues through age 59. However, your health care provider may recommend screening at an earlier age if you have risk factors for colon cancer. On a yearly basis, your health care provider may provide home test kits to check for hidden blood in the stool. A small camera at the end of a tube may be used to directly examine the colon (sigmoidoscopy or colonoscopy) to detect the earliest forms of colorectal cancer. Talk to your health care provider about this at age 68 when routine screening begins. A direct exam of the colon should be repeated every 5-10 years through age 78, unless early forms  of precancerous polyps or small growths are found.  People who are at an increased risk for hepatitis B should  be screened for this virus. You are considered at high risk for hepatitis B if:  You were born in a country where hepatitis B occurs often. Talk with your health care provider about which countries are considered high risk.  Your parents were born in a high-risk country and you have not received a shot to protect against hepatitis B (hepatitis B vaccine).  You have HIV or AIDS.  You use needles to inject street drugs.  You live with, or have sex with, someone who has hepatitis B.  You are a man who has sex with other men (MSM).  You get hemodialysis treatment.  You take certain medicines for conditions like cancer, organ transplantation, and autoimmune conditions.  Hepatitis C blood testing is recommended for all people born from [birth year] through 1963 and any individual with known risk factors for hepatitis C.  Healthy men should no longer receive prostate-specific antigen (PSA) blood tests as part of routine cancer screening. Talk to your health care provider about prostate cancer screening.  Testicular cancer screening is not recommended for adolescents or adult males who have no symptoms. Screening includes self-exam, a health care provider exam, and other screening tests. Consult with your health care provider about any symptoms you have or any concerns you have about testicular cancer.  Practice safe sex. Use condoms and avoid high-risk sexual practices to reduce the spread of sexually transmitted infections (STIs).  You should be screened for STIs, including gonorrhea and chlamydia if:  You are sexually active and are younger than 24 years.  You are older than 24 years, and your health care provider tells you that you are at risk for this type of infection.  Your sexual activity has changed since you were last screened, and you are at an increased risk for chlamydia or gonorrhea. Ask your health care provider if you are at risk.  If you are at risk of being infected with HIV, it  is recommended that you take a prescription medicine daily to prevent HIV infection. This is called pre-exposure prophylaxis (PrEP). You are considered at risk if:  You are a man who has sex with other men (MSM).  You are a heterosexual man who is sexually active with multiple partners.  You take drugs by injection.  You are sexually active with a partner who has HIV.  Talk with your health care provider about whether you are at high risk of being infected with HIV. If you choose to begin PrEP, you should first be tested for HIV. You should then be tested every 3 months for as long as you are taking PrEP.  Use sunscreen. Apply sunscreen liberally and repeatedly throughout the day. You should seek shade when your shadow is shorter than you. Protect yourself by wearing long sleeves, pants, a wide-brimmed hat, and sunglasses year round whenever you are outdoors.  Tell your health care provider of new moles or changes in moles, especially if there is a change in shape or color. Also, tell your health care provider if a mole is larger than the size of a pencil eraser.  A one-time screening for abdominal aortic aneurysm (AAA) and surgical repair of large AAAs by ultrasound is recommended for men aged 19-75 years who are current or former smokers.  Stay current with your vaccines (immunizations). This information is not intended to replace advice given to you by  your health care provider. Make sure you discuss any questions you have with your health care provider. Document Released: 09/21/2007 Document Revised: 04/15/2014 Document Reviewed: 12/27/2014 Elsevier Interactive Patient Education  2017 [employer].

## 2016-03-05 ENCOUNTER — Ambulatory Visit: Payer: Medicare Other | Admitting: *Deleted

## 2016-03-06 NOTE — Progress Notes (Signed)
Subjective:   Jewel Venditto is a 60 y.o. male who presents for an Initial Medicare Annual Wellness Visit.  Cardiac Risk Factors include: advanced age (>14mn, >>76women);dyslipidemia;hypertension;male gender;smoking/ tobacco exposure    Objective:    Today's Vitals   03/04/16 1408 03/04/16 1456  BP: 113/80   Pulse: 72   Temp: 98.1 F (36.7 C)   TempSrc: Oral   SpO2: 97%   Weight: 158 lb 12.8 oz (72 kg)   Height: _0  (1.702 m)   PainSc:  4    Vitals:   03/04/16 1408  BP: 113/80  Pulse: 72  Temp: 98.1 F (36.7 C)  SpO2 97% Weight 158 lbs 12.8 oz Height 5'7 BMI 24.87  Current Medications (verified) Outpatient Encounter Prescriptions as of 03/04/2016  Medication Sig  . ALPRAZolam (XANAX) 0.5 MG tablet Take 0.5 mg by mouth 3 (three) times daily as needed for anxiety.  .Marland Kitchenaspirin EC 81 MG tablet Take 81 mg by mouth every 6 (six) hours as needed for mild pain.   . baclofen (LIORESAL) 10 MG tablet Take 1 tablet (10 mg total) by mouth at bedtime as needed for muscle spasms.  . cholecalciferol (VITAMIN D) 1000 UNITS tablet Take 2,000 Units by mouth daily.  . Cyanocobalamin (VITAMIN B-12 PO) Take 1 tablet by mouth daily.  . Glucosamine-Chondroitin (GLUCOSAMINE CHONDR COMPLEX PO) Take 1 tablet by mouth daily.  .Marland KitchenHYDROmorphone (DILAUDID) 2 MG tablet Take 2 mg by mouth every 4 (four) hours as needed for severe pain.   .Marland KitchenlamoTRIgine (LAMICTAL) 200 MG tablet Take 200 mg by mouth at bedtime.   . mirtazapine (REMERON) 45 MG tablet Take 45 mg by mouth at bedtime.  . Multiple Vitamin (MULTIVITAMIN WITH MINERALS) TABS Take 1 tablet by mouth daily.   . TURMERIC PO Take 1 tablet by mouth daily.  . Dentifrices (BIOTENE DRY MOUTH) GEL Apply to tongue as needed for dry mouth. (Patient not taking: Reported on 03/04/2016)  . GRAPE SEED EXTRACT PO Take 2 capsules by mouth daily.  .Marland Kitchenibuprofen (ADVIL,MOTRIN) 600 MG tablet Take 1 tablet (600 mg total) by mouth every 8 (eight) hours as needed.  (Patient not taking: Reported on 03/04/2016)  . ondansetron (ZOFRAN) 4 MG tablet Take 1 tablet (4 mg total) by mouth every 8 (eight) hours as needed for nausea or vomiting. (Patient not taking: Reported on 03/04/2016)  . pantoprazole (PROTONIX) 40 MG tablet Take 1 tablet (40 mg total) by mouth 2 (two) times daily. (Patient not taking: Reported on 03/04/2016)  . polyethylene glycol powder (GLYCOLAX/MIRALAX) powder Take 17 g by mouth daily as needed (constipation). (Patient not taking: Reported on 03/04/2016)  . [DISCONTINUED] amoxicillin (AMOXIL) 500 MG capsule Take 2 capsules (1,000 mg total) by mouth 2 (two) times daily. (Patient not taking: Reported on 03/04/2016)  . [DISCONTINUED] clarithromycin (BIAXIN) 500 MG tablet Take 1 tablet (500 mg total) by mouth 2 (two) times daily. (Patient not taking: Reported on 03/04/2016)   Facility-Administered Encounter Medications as of 03/04/2016  Medication  . 0.9 %  sodium chloride infusion    Allergies (verified) Morphine and related   History: Past Medical History:  Diagnosis Date  . Alcohol abuse   . Allergy   . Bipolar disorder ([place]   . Chronic pain    lower back pain, "that has progressed"  . Depression   . Diverticulosis   . Dyspnea   . Hepatitis C    hx. of"states he no longer has"" is clear".  .Marland Kitchen  History of recreational drug use    "cocaine, marijuana, polysubstance" quit 1987.  Marland Kitchen HLD (hyperlipidemia)   . Hypertension   . Panic attack   . Sleep apnea    no cpap used,still has machine(use rarely)   Past Surgical History:  Procedure Laterality Date  . CARDIAC CATHETERIZATION  2003   No CAD  . EUS N/A 06/23/2014   Procedure: UPPER ENDOSCOPIC ULTRASOUND (EUS) LINEAR;  Surgeon: Milus Banister, MD;  Location: [facility];  Service: Endoscopy;  Laterality: N/A;  . sinus surgery    . TONSILLECTOMY     Family History  Problem Relation Age of Onset  . Heart disease Mother   . Kidney disease Mother   . COPD Mother   . Lupus  Mother   . Heart disease Father   . Breast cancer Maternal Grandmother   . Diabetes Maternal Grandfather    Social History   Occupational History  . DISABLED   .      Social History Main Topics  . Smoking status: Former Smoker    Packs/day: 2.00    Years: 16.00    Types: Cigarettes    Quit date: 04/08/2002  . Smokeless tobacco: Never Used  . Alcohol use No     Comment: Quit in 1987  . Drug use: No     Comment: Use to take cocaine, marijuana, per patient everything. Stopped in 1987.  Marland Kitchen Sexual activity: Not Currently    Partners: Female   Tobacco Counseling Patient is former smoker with no plans to restart  Activities of Daily Living In your present state of health, do you have any difficulty performing the following activities: 03/04/2016 03/04/2016  Hearing? - N  Vision? - N  Difficulty concentrating or making decisions? (No Data) Y  Walking or climbing stairs? (No Data) Y  Dressing or bathing? (No Data) Y  Doing errands, shopping? (No Data) Y  Conservation officer, nature and eating ? - N  Using the Toilet? - N  In the past six months, have you accidently leaked urine? - N  Do you have problems with loss of bowel control? - N  Managing your Medications? - N  Managing your Finances? (No Data) Y  Housekeeping or managing your Housekeeping? (No Data) Y  Some recent data might be hidden  Home Safety:  My home has a working smoke alarm:  Yes           My home throw rugs have been fastened down to the floor or removed:  Removed I have non-slip mats in the bathtub and shower:  Yes         All my home's stairs have railings or bannisters: One level home with no outside stairs.           My home's floors, stairs and hallways are free from clutter, wires and cords:  Yes     I wear seatbelts consistently:  Yes    Immunizations and Health Maintenance Immunization History  Administered Date(s) Administered  . H1N1 04/05/2008  . Influenza Split 06/10/2012  . Influenza Whole 02/02/2007,  04/05/2008, 02/27/2009, 12/18/2010  . Influenza,inj,Quad PF,36+ Mos 01/04/2014, 12/21/2014  . Influenza-Unspecified 02/06/2013, 01/06/2016  . Pneumococcal Polysaccharide-23 08/25/2014   There are no preventive care reminders to display for this patient.  Patient Care Team: Sela Hua, MD as PCP - General (Family Medicine) Norma Fredrickson, MD as Consulting Physician (Psychiatry) Dr Hardin Negus for Pain Management [facility] Harol[facility] R[facility] and [facility]  Indicate any recent  Medical Services you may have received from other than [facility] providers in the past year (date may be approximate).    Assessment:   This is a routine wellness examination for [facility].   Hearing/Vision screen  Hearing Screening   Method: Audiometry   _0  _1  _2  _3  _4  _5  _6  _7  _8   Right ear:   _9 Left ear:   _10 Dietary issues and exercise activities discussed: Current Exercise Habits: Home exercise routine;Structured exercise class [facility]), Type of exercise: yoga;stretching;strength training/weights (Tai Chi), Time (Minutes): 60, Frequency (Times/Week): 7, Weekly Exercise (Minutes/Week): 420, Intensity: Mild, Exercise limited by: orthopedic condition(s)  Goals    . Blood Pressure < 140/90      Depression Screen PHQ 2/9 Scores 03/04/2016 03/04/2016 03/20/2015 02/13/2015  PHQ - 2 Score 1 0 0 1  PHQ- 9 Score - - - -    Fall Risk Fall Risk  03/04/2016 03/04/2016 09/06/2015 02/16/2013  Falls in the past year? Yes No No No  Number falls in past yr: 2 or more - - -  Injury with Fall? No - - -  Risk Factor Category  High Fall Risk - - -  Risk for fall due to : Impaired mobility;Impaired balance/gait;History of fall(s) - - -  Follow up Education provided;Falls prevention discussed - - -  Patient has dog that has tripped him up and caused falls. Falls prevention discussed and literature given. PCP notified of fall risk status.  TUG  Test:  Done in 10 seconds. Patient used both hands to push out of chair and to sit back down.  Cognitive Function: Mini-Cog  Passed with score 3/5   Cognitive Function: MMSE - Mini Mental State Exam 02/28/2014  Orientation to time 5  Orientation to Place 5  Registration 3  Attention/ Calculation 5  Recall 3  Language- name 2 objects 2  Language- repeat 1  Language- follow 3 step command 2  Language- read & follow direction 1  Write a sentence 1  Copy design 1  Total score 29       Screening Tests Health Maintenance  Topic Date Due  . TETANUS/TDAP  08/11/2016  . COLONOSCOPY  05/30/2024  . INFLUENZA VACCINE  Completed  . Hepatitis C Screening  Completed  . HIV Screening  Completed        Plan:     During the course of the visit Guss was educated and counseled about the following appropriate screening and preventive services:   Vaccines to include Pneumoccal, Influenza, Td, Zostavax  Colorectal cancer screening  Cardiovascular disease screening  Diabetes screening  Nutrition counseling  Patient Instructions (the written plan) were given to the patient.   Howell Rucks, RN, BSN   03/06/2016    I have reviewed this visit and discussed with Howell Rucks, RN, BSN, and agree with her documentation.  Berna Spare Mayo

## 2016-06-11 ENCOUNTER — Ambulatory Visit: Payer: Medicare Other | Admitting: Family Medicine

## 2016-06-12 ENCOUNTER — Ambulatory Visit: Payer: Medicare Other | Admitting: Family Medicine

## 2016-06-14 ENCOUNTER — Ambulatory Visit (HOSPITAL_COMMUNITY)
Admission: EM | Admit: 2016-06-14 | Discharge: 2016-06-14 | Disposition: A | Discharge status: Home / Self Care | Payer: Medicare Other | Attending: Emergency Medicine | Admitting: Emergency Medicine

## 2016-06-14 ENCOUNTER — Encounter (HOSPITAL_COMMUNITY): Payer: Self-pay | Admitting: Emergency Medicine

## 2016-06-14 DIAGNOSIS — G8929 Other chronic pain: Secondary | ICD-10-CM | POA: Diagnosis not present

## 2016-06-14 DIAGNOSIS — M545 Low back pain: Secondary | ICD-10-CM

## 2016-06-14 MED ORDER — DICLOFENAC SODIUM 75 MG PO TBEC: 75.0000 mg | DELAYED_RELEASE_TABLET | Freq: Two times a day (BID) | ORAL | 0 refills | Status: DC

## 2016-06-14 MED ORDER — METHOCARBAMOL 500 MG PO TABS: 500.0000 mg | ORAL_TABLET | Freq: Two times a day (BID) | ORAL | 0 refills | Status: DC

## 2016-06-14 NOTE — ED Provider Notes (Signed)
CSN: [id number]     Arrival date & time 06/14/16  1834 History   None    Chief Complaint  Patient presents with  . Back Pain   (Consider location/radiation/quality/duration/timing/severity/associated sxs/prior Treatment) 61 year old male presents with a 5 week chief complaint of lower back pain. He states he has a chronic history of lower back pain, and is seen at the pain clinic. He reports his pain has worsened over the last 2 weeks after seeing a chiropractor. He reports after getting an adjustment the chiropractor, he felt a sharp, shooting pain. He denies any radiation, he has no tingling or shooting pain in his legs, he has had no loss of control of his bowel or bladder. He has no fever, headache, shortness of breath, nausea, or other complaints. From his pain clinic, he takes 2 mg of Dilaudid every 4 hours orally, he has not tried any over-the-counter therapies such as Tylenol, or ibuprofen.   The history is provided by the patient.  Back Pain  Associated symptoms: no abdominal pain, no chest pain, no dysuria and no fever     Past Medical History:  Diagnosis Date  . Alcohol abuse   . Allergy   . Bipolar disorder ([place])   . Chronic pain    lower back pain, "that has progressed"  . Depression   . Diverticulosis   . Dyspnea   . Hepatitis C    hx. of"states he no longer has"" is clear".  . History of recreational drug use    "cocaine, marijuana, polysubstance" quit 1987.  Marland Kitchen HLD (hyperlipidemia)   . Hypertension   . Panic attack   . Sleep apnea    no cpap used,still has machine(use rarely)   Past Surgical History:  Procedure Laterality Date  . CARDIAC CATHETERIZATION  2003   No CAD  . EUS N/A 06/23/2014   Procedure: UPPER ENDOSCOPIC ULTRASOUND (EUS) LINEAR;  Surgeon: Milus Banister, MD;  Location: [facility];  Service: Endoscopy;  Laterality: N/A;  . sinus surgery    . TONSILLECTOMY     Family History  Problem Relation Age of Onset  . Heart disease Mother   . Kidney  disease Mother   . COPD Mother   . Lupus Mother   . Heart disease Father   . Breast cancer Maternal Grandmother   . Diabetes Maternal Grandfather    Social History  Substance Use Topics  . Smoking status: Former Smoker    Packs/day: 2.00    Years: 16.00    Types: Cigarettes    Quit date: 04/08/2002  . Smokeless tobacco: Never Used  . Alcohol use No     Comment: Quit in 1987    Review of Systems  Constitutional: Negative for chills, fatigue and fever.  HENT: Negative.   Eyes: Negative.   Respiratory: Negative for cough, chest tightness, shortness of breath and wheezing.   Cardiovascular: Negative for chest pain and palpitations.  Gastrointestinal: Negative for abdominal pain, constipation, diarrhea and nausea.  Endocrine: Negative for cold intolerance and heat intolerance.  Genitourinary: Negative for dysuria and frequency.  Musculoskeletal: Positive for back pain. Negative for gait problem, myalgias, neck pain and neck stiffness.  Skin: Negative.   Neurological: Negative.     Allergies  Morphine and related  Home Medications   Prior to Admission medications   Medication Sig Start Date End Date Taking? Authorizing Provider  ALPRAZolam Duanne Moron) 0.5 MG tablet Take 0.5 mg by mouth 3 (three) times daily as needed for anxiety.  Yes Historical Provider, MD  cholecalciferol (VITAMIN D) 1000 UNITS tablet Take 2,000 Units by mouth daily.   Yes Historical Provider, MD  Cyanocobalamin (VITAMIN B-12 PO) Take 1 tablet by mouth daily.   Yes Historical Provider, MD  HYDROmorphone (DILAUDID) 2 MG tablet Take 2 mg by mouth every 4 (four) hours as needed for severe pain.  09/21/15  Yes Historical Provider, MD  lamoTRIgine (LAMICTAL) 200 MG tablet Take 200 mg by mouth at bedtime.  07/30/13  Yes Historical Provider, MD  mirtazapine (REMERON) 45 MG tablet Take 45 mg by mouth at bedtime.   Yes Historical Provider, MD  Multiple Vitamin (MULTIVITAMIN WITH MINERALS) TABS Take 1 tablet by mouth daily.     Yes Historical Provider, MD  ondansetron (ZOFRAN) 4 MG tablet Take 1 tablet (4 mg total) by mouth every 8 (eight) hours as needed for nausea or vomiting. 12/05/14  Yes [name], MD  polyethylene glycol powder (GLYCOLAX/MIRALAX) powder Take 17 g by mouth daily as needed (constipation). 12/07/14  Yes [name], MD  diclofenac (VOLTAREN) 75 MG EC tablet Take 1 tablet (75 mg total) by mouth 2 (two) times daily. 06/14/16   Barnet Glasgow, NP  Glucosamine-Chondroitin (GLUCOSAMINE CHONDR COMPLEX PO) Take 1 tablet by mouth daily.    Historical Provider, MD  GRAPE SEED EXTRACT PO Take 2 capsules by mouth daily.    Historical Provider, MD  methocarbamol (ROBAXIN) 500 MG tablet Take 1 tablet (500 mg total) by mouth 2 (two) times daily. 06/14/16   Barnet Glasgow, NP  TURMERIC PO Take 1 tablet by mouth daily.    Historical Provider, MD   Meds Ordered and Administered this Visit  Medications - No data to display  BP 125/83 (BP Location: Right Arm)   Pulse 82   Temp 98.4 F (36.9 C) (Oral)   Resp 16   SpO2 97%  No data found.   Physical Exam  Constitutional: He is oriented to person, place, and time. He appears well-developed and well-nourished. No distress.  HENT:  Head: Normocephalic and atraumatic.  Right Ear: External ear normal.  Left Ear: External ear normal.  Cardiovascular: Normal rate and regular rhythm.   Pulmonary/Chest: Effort normal and breath sounds normal.  Abdominal: Soft. Bowel sounds are normal.  Musculoskeletal:       Lumbar back: He exhibits tenderness, pain and spasm. He exhibits no bony tenderness, no swelling, no deformity and no laceration.       Back:  Neurological: He is alert and oriented to person, place, and time.  Skin: Skin is warm and dry. Capillary refill takes less than 2 seconds. He is not diaphoretic.  Psychiatric: He has a normal mood and affect. His behavior is normal.  Nursing note and vitals reviewed.   Urgent Care Course      Procedures (including critical care time)  Labs Review Labs Reviewed - No data to display  Imaging Review No results found.      MDM   1. Chronic bilateral low back pain without sciatica    You most likely have a strained muscle in your lower back. I have prescribed two medicines for your pain. The first is diclofenac, take 1 tablet twice a day and the other is robaxin, take 1 tablet twice a day. Robaxin may cause drowsiness so do not drive until you know how this medicine affects you. Also do not drink any alcohol either. You may apply ice and alternate with heat for 15 minutes at a time 4  times daily and for additional pain control you may take tylenol over the counter ever 4 hours but do not take more than 4000 mg a day. Should your pain continue or fail to resolve, follow up with your primary care provider or return to clinic as needed.       Barnet Glasgow, NP 06/14/16 2003

## 2016-06-14 NOTE — Discharge Instructions (Addendum)
You most likely have a strained muscle in your lower back. I have prescribed two medicines for your pain. The first is diclofenac, take 1 tablet twice a day and the other is robaxin, take 1 tablet twice a day. Robaxin may cause drowsiness so do not drive until you know how this medicine affects you. Also do not drink any alcohol either. You may apply ice and alternate with heat for 15 minutes at a time 4 times daily and for additional pain control you may take tylenol over the counter ever 4 hours but do not take more than 4000 mg a day. Should your pain continue or fail to resolve, follow up with your primary care provider or return to clinic as needed.

## 2016-06-14 NOTE — ED Triage Notes (Signed)
Here for chronic back pain onset 5 weeks... This happened after being inside a pool.   sts pain is not getting any better and it's actually getting worse  Pain increases w/activity  Taking dilaudid and muscle relaxer's w/no relief  Has been to a chiropractor w/no relief.

## 2016-06-17 ENCOUNTER — Ambulatory Visit: Payer: Medicare Other | Admitting: Obstetrics and Gynecology

## 2016-06-21 ENCOUNTER — Ambulatory Visit: Payer: Medicare Other | Admitting: Obstetrics and Gynecology

## 2016-06-21 ENCOUNTER — Telehealth: Payer: Self-pay | Admitting: Internal Medicine

## 2016-06-21 NOTE — Telephone Encounter (Signed)
Received the following phone message:  Pt missed his appt today because he thought it was at 3pm.  He would like to talk to dr Brett Albino.  He is afraid to even to come into the office for fear something will be wrong.  He feels dr doesn't want to even see him so he wants to "make it right".  If she doesn't want to continue as his pcp, then maybe she can refer him to somebody.  I have never met this patient, but I returned his call and assured him that I am happy to continue to be his PCP and I would be happy to see him for an appointment in our office to meet him in person.   Patient voiced understanding and stated that he sometimes believes things that aren't true when his anxiety gets really bad. He will call our office to schedule an appointment to discuss his anxiety further.  [name], MD

## 2016-06-21 NOTE — Telephone Encounter (Signed)
Will forward to MD. Jazmin Hartsell,CMA  

## 2016-06-21 NOTE — Telephone Encounter (Signed)
Pt missed his appt today because he thought it was at 3pm.  He would like to talk to dr Brett Albino.  He is afraid to even to come into the office for fear something will be wrong.  He feels dr doesn't want to even see him so he wants to "make it right".  If she doesn't want to continue as his pcp, then maybe she can refer him to somebody

## 2016-06-21 NOTE — Progress Notes (Deleted)
   Subjective:   Patient ID: [name], male    DOB: 1955-05-06, 61 y.o.   MRN: [id number]  Patient presents for Same Day Appointment  No chief complaint on file.   HPI: # ***   Review of Systems   See HPI for ROS.   History  Smoking Status  . Former Smoker  . Packs/day: 2.00  . Years: 16.00  . Types: Cigarettes  . Quit date: 04/08/2002  Smokeless Tobacco  . Never Used    Past medical history, surgical, family, and social history reviewed and updated in the EMR as appropriate.  Pertinent Historical Findings include: Objective:  There were no vitals taken for this visit. Vitals and nursing note reviewed  Physical Exam  Assessment & Plan:  There are no diagnoses linked to this encounter.    Diagnosis and plan along with any newly prescribed medication(s) were discussed in detail with this patient today. The patient verbalized understanding and agreed with the plan. Patient advised if symptoms worsen return to clinic or ER.   PATIENT EDUCATION PROVIDED: See AVS   Luiz Blare, DO 06/21/2016, 8:35 AM PGY-3, [place]

## 2016-07-16 ENCOUNTER — Ambulatory Visit (INDEPENDENT_AMBULATORY_CARE_PROVIDER_SITE_OTHER): Payer: Medicare Other | Admitting: Family Medicine

## 2016-07-16 VITALS — BP 128/83 | HR 83 | Temp 97.7°F | Wt 159.2 lb

## 2016-07-16 DIAGNOSIS — J029 Acute pharyngitis, unspecified: Secondary | ICD-10-CM | POA: Diagnosis not present

## 2016-07-16 LAB — POCT RAPID STREP A (OFFICE): Rapid Strep A Screen: NEGATIVE

## 2016-07-16 MED ORDER — PANTOPRAZOLE SODIUM 40 MG PO TBEC
40.0000 mg | DELAYED_RELEASE_TABLET | Freq: Every day | ORAL | 0 refills | Status: DC
Start: 2016-07-16 — End: 2016-11-08

## 2016-07-16 NOTE — Patient Instructions (Signed)
I do not see signs of infection your exam and the swab was negative in clinic.  Sometimes reflux can cause both sore throat and dry cough. In addition to the diet changes you've already made, try taking Protonix daily.  If your symptoms do not improve or worsen, please follow up with Dr. Brett Albino.

## 2016-07-16 NOTE — Progress Notes (Signed)
Subjective: FG:HWEX throat HPI: Patient is a 61 y.o. male with a past medical history of dry mouth, hep C, h/o alcohol abuse, HTN, OSA presenting to clinic today for a SDA for sore throat.  Patient has had a sore throat x 6-7 weeks.  He went to UC 3 weeks ago and diagnosed with something- he states that they did a swab and called in 1 week later. He is unsure what he was diagnosed with and states he was prescribed an antibiotic, but doesn't know the name of it. He feels his pain has not improved, in fact it may have worsened. No pain with swallowing, no drooling.  His sore throat is not worse during any particular time of the day.  Developed dry cough a couple of weeks ago.  No fevers, rhinorrhea, otalgias, chest pain, SOB.  Having sweats at night, congestion in the AM but this is stable.   He uses a sleep apnea machine. He took benadryl and throat lozenges which didn't help They found cancer on his chest (after later clarification, skin cancer) and he was worried that his sore throat is related.   Social History: denies tobacco and alcohol use  ROS: All other systems reviewed and are negative.  Past Medical History Patient Active Problem List   Diagnosis Date Noted  . Dry mouth 05/26/2015  . SOB (shortness of breath) 12/21/2014  . Abdominal pain, chronic, epigastric 11/30/2014  . HLD (hyperlipidemia) 08/25/2014  . Low back pain 02/28/2014  . Dyspnea on exertion 09/07/2012  . Sore throat 12/25/2011  . OSA on CPAP 09/20/2009  . HYPERTENSION, BENIGN - Diet Controlled 10/19/2008  . RHINITIS 05/13/2007  . HEPATITIS C 06/05/2006  . DEPRESSION, MAJOR, RECURRENT 06/05/2006  . BIPOLAR DISORDER 06/05/2006  . ANXIETY 06/05/2006  . Hx of Alcohol abuse 06/05/2006  . BACK PAIN, LOW 06/05/2006    Medications- reviewed and updated Current Outpatient Prescriptions  Medication Sig Dispense Refill  . ALPRAZolam (XANAX) 0.5 MG tablet Take 0.5 mg by mouth 3 (three) times daily as needed  for anxiety.    . cholecalciferol (VITAMIN D) 1000 UNITS tablet Take 2,000 Units by mouth daily.    . Cyanocobalamin (VITAMIN B-12 PO) Take 1 tablet by mouth daily.    . diclofenac (VOLTAREN) 75 MG EC tablet Take 1 tablet (75 mg total) by mouth 2 (two) times daily. 20 tablet 0  . Glucosamine-Chondroitin (GLUCOSAMINE CHONDR COMPLEX PO) Take 1 tablet by mouth daily.    Marland Kitchen GRAPE SEED EXTRACT PO Take 2 capsules by mouth daily.    Marland Kitchen HYDROmorphone (DILAUDID) 2 MG tablet Take 2 mg by mouth every 4 (four) hours as needed for severe pain.   0  . lamoTRIgine (LAMICTAL) 200 MG tablet Take 200 mg by mouth at bedtime.     . methocarbamol (ROBAXIN) 500 MG tablet Take 1 tablet (500 mg total) by mouth 2 (two) times daily. 20 tablet 0  . mirtazapine (REMERON) 45 MG tablet Take 45 mg by mouth at bedtime.    . Multiple Vitamin (MULTIVITAMIN WITH MINERALS) TABS Take 1 tablet by mouth daily.     . ondansetron (ZOFRAN) 4 MG tablet Take 1 tablet (4 mg total) by mouth every 8 (eight) hours as needed for nausea or vomiting. 10 tablet 0  . pantoprazole (PROTONIX) 40 MG tablet Take 1 tablet (40 mg total) by mouth daily. 30 tablet 0  . polyethylene glycol powder (GLYCOLAX/MIRALAX) powder Take 17 g by mouth daily as needed (constipation). 500 g 1  .  TURMERIC PO Take 1 tablet by mouth daily.     No current facility-administered medications for this visit.    Facility-Administered Medications Ordered in Other Visits  Medication Dose Route Frequency Provider Last Rate Last Dose  . 0.9 %  sodium chloride infusion   Intravenous Continuous Montez Morita, MD 20 mL/hr at 09/15/12 1635 500 mL at 09/15/12 1635    Objective: Office vital signs reviewed. BP 128/83   Pulse 83   Temp 97.7 F (36.5 C) (Oral)   Wt 159 lb 3.2 oz (72.2 kg)   SpO2 96%   BMI 24.93 kg/m    Physical Examination:  General: Awake, alert, well- nourished, NAD ENMT:  TMs intact, normal light reflex, no erythema, no bulging. Nasal turbinates moist.  MMM, Oropharynx clear with very minimal erythema. No tonsillar exudate/hypertrophy Neck: no LAD Eyes: Conjunctiva non-injected. PERRL.  Cardio: RRR, no m/r/g noted. No thrill  Pulm: No increased WOB.  CTAB, without wheezes, rhonchi or crackles noted.   Rapid Strep A negative  Assessment/Plan: Sore throat Has been treated recently with an antibiotic, unsure of which. His current symptoms do not sound infectious. Could be post nasal drip however the pt denies rhinorrhea and it is not worsened during any particular time of the day. May also be due to reflux given dry cough as well. Pt does note a strong h/o reflux that was improved with diet modifications and notes a change in diet recently due to family hospitalizations. He's started to try to get back on his regular diet. - Rx for Protonix - discussed improving diet again - return precautions, if not improving in 2 weeks will f/u with PCP.   Orders Placed This Encounter  Procedures  . Rapid Strep A    Meds ordered this encounter  Medications  . pantoprazole (PROTONIX) 40 MG tablet    Sig: Take 1 tablet (40 mg total) by mouth daily.    Dispense:  30 tablet    Refill:  [place] PGY-3, [place]

## 2016-07-16 NOTE — Assessment & Plan Note (Signed)
Has been treated recently with an antibiotic, unsure of which. His current symptoms do not sound infectious. Could be post nasal drip however the pt denies rhinorrhea and it is not worsened during any particular time of the day. May also be due to reflux given dry cough as well. Pt does note a strong h/o reflux that was improved with diet modifications and notes a change in diet recently due to family hospitalizations. He's started to try to get back on his regular diet. - Rx for Protonix - discussed improving diet again - return precautions, if not improving in 2 weeks will f/u with PCP.

## 2016-07-26 ENCOUNTER — Ambulatory Visit (INDEPENDENT_AMBULATORY_CARE_PROVIDER_SITE_OTHER): Payer: Medicare Other | Admitting: Internal Medicine

## 2016-07-26 ENCOUNTER — Ambulatory Visit (HOSPITAL_COMMUNITY)
Admission: RE | Admit: 2016-07-26 | Discharge: 2016-07-26 | Disposition: A | Discharge status: Home / Self Care | Payer: Medicare Other | Source: Ambulatory Visit | Attending: Family Medicine | Admitting: Family Medicine

## 2016-07-26 ENCOUNTER — Encounter: Payer: Self-pay | Admitting: Internal Medicine

## 2016-07-26 DIAGNOSIS — R0789 Other chest pain: Secondary | ICD-10-CM | POA: Insufficient documentation

## 2016-07-26 MED ORDER — ASPIRIN EC 81 MG PO TBEC: 81.0000 mg | DELAYED_RELEASE_TABLET | Freq: Every day | ORAL | 2 refills | Status: DC

## 2016-07-26 MED ORDER — NITROGLYCERIN 0.3 MG SL SUBL: 0.3000 mg | SUBLINGUAL_TABLET | SUBLINGUAL | 0 refills | Status: DC | PRN

## 2016-07-26 NOTE — Patient Instructions (Addendum)
I want to refer you to cardiology to have them checked out your heart - please look out for this call. I want you to start taking 81 mg of aspirin. I will provide you with nitroglycerin, place under your tongue -> if you develop chest pain you can try this medication if the pain does not improve or go away with the nitroglycerin, I would advised you to go to the ED for evaluation. Please make sure to follow-up with your psychiatrist and counselor as discussed. Please follow up with your PCP in about two weeks.

## 2016-07-26 NOTE — Progress Notes (Signed)
[facility] Kerrin Mo, MD Phone: [phone]  Reason For Visit: Chest cramping   # Patient presenting with concerns about chest tightness and cramping. States that 2 days ago he woke up at 4 AM with chest tightness and cramping last 2 hours. He indicates that it was all across his chest from right  to left side. He denied any specific location, or it being midsternal. He then woke up again last night at around 11 PM with that similar chest tightness/cramping - lasting for about 2 hours. He denied any associated diaphoresis, nausea, radiation, shortness of breath associated with this pain. She denied having any history of exertional chest pain or shortness of breath in the last 6 months. He denied any history of fevers, chills, severe cough, congestion. He denies any smoking, or history of smoking, he denies any recent alcohol use, said he has not drank in over 30 years. He denies any cocaine use. He indicates that he does not know his parents and does not know if they have a cardiac history. He indicates having high anxiety over the last couple of weeks however has been seeing his psychiatrist and counselor twice weekly for this issue. His GAD-7 was 18, however seems to have good follow up.  He indicates that he does have a history of GERD, and about 6 months ago was started on protonix as his reflux is acting up. He denies any burning sensation along his throat, burping,. He indicates he has been taking his Protonix daily. He denies having any big meals or changes in his diet previous to these 2 episodes.  PHQ- 9  GAD-7 18 Past Medical History Reviewed problem list.  Medications- reviewed and updated No additions to family history Social history- patient is a non- smoker  Objective: BP 110/80 (BP Location: Left Arm, Patient Position: Sitting, Cuff Size: Normal)   Pulse 82   Temp 97.8 F (36.6 C) (Oral)   Ht 5\' 7"  (1.702 m)   Wt 157 lb (71.2 kg)   SpO2 98%   BMI  24.59 kg/m  Gen: NAD, alert, cooperative with exam Cardio: regular rate and rhythm, S1S2 heard, no murmurs appreciated Pulm: clear to auscultation bilaterally, no wheezes, rhonchi or rales GI: soft, non-tender, non-distended, bowel sounds present, no hepatomegaly, no splenomegaly Skin: dry, intact, no rashes or lesions Psych: nervous affect  Assessment/Plan: See problem based a/p   Chest tightness Chest tightness last night, none currently - not midsternal, no diaphoresis, nausea, shortness of breath, or dizziness. Not exertionally associated. No radiation of pain, does not seem to be consistent with GERD, no concern for infection. EKG with T-wave inversion in V1 and V2 previous EKGs with T-wave inversion in V1 and V2 flattening. No report of anginal type symptoms. Due to possible concern for cardiac given patient's age and sex  + EKG findings, will have patient follow with cardiology for possible stress test, will send in urgent referral. Will also get general labs including CBC, bmet, lipid panel. He was noted to have a GAD- 7 of 18 - follows with [facility] and sees his counselor, will be seeing his counselor later this week, denies any further resources needs, no SI. - possibly chest tightness/cramping due to anxiety, however will need to rule out more serious causes. Discussed with Dr. Mingo Amber  - Provided patient with nitroglycerin to take if he were to develop this chest pain again. If it did not improve with nitroglycerin indicated patient should go to the ED  for evaluation. -Also started patient on 81 mg of aspirin as there is concern for possibly CAD

## 2016-07-26 NOTE — Assessment & Plan Note (Addendum)
Chest tightness last night, none currently - not midsternal, no diaphoresis, nausea, shortness of breath, or dizziness. Not exertionally associated. No radiation of pain, does not seem to be consistent with GERD, no concern for infection. EKG with T-wave inversion in V1 and V2 previous EKGs with T-wave inversion in V1 and V2 flattening. No report of anginal type symptoms. Due to possible concern for cardiac given patient's age and sex  + EKG findings, will have patient follow with cardiology for possible stress test, will send in urgent referral. Will also get general labs including CBC, bmet, lipid panel. He was noted to have a GAD- 7 of 18 - follows with [facility] and sees his counselor, will be seeing his counselor later this week, denies any further resources needs, no SI. - possibly chest tightness/cramping due to anxiety, however will need to rule out more serious causes. Discussed with Dr. Mingo Amber  - Provided patient with nitroglycerin to take if he were to develop this chest pain again. If it did not improve with nitroglycerin indicated patient should go to the ED for evaluation. -Also started patient on 81 mg of aspirin as there is concern for possibly CAD

## 2016-07-27 LAB — BASIC METABOLIC PANEL
BUN/Creatinine Ratio: 12 (ref 9–20)
BUN: 10 mg/dL (ref 6–24)
CO2: 23 mmol/L (ref 18–29)
Calcium: 9.8 mg/dL (ref 8.7–10.2)
Chloride: 104 mmol/L (ref 96–106)
Creatinine, Ser: 0.86 mg/dL (ref 0.76–1.27)
GFR calc Af Amer: 110 mL/min/{1.73_m2} (ref >59)
GFR calc non Af Amer: 95 mL/min/{1.73_m2} (ref >59)
Glucose: 107 mg/dL — ABNORMAL HIGH (ref 65–99)
Potassium: 4.8 mmol/L (ref 3.5–5.2)
Sodium: 143 mmol/L (ref 134–144)

## 2016-07-27 LAB — CBC
Hematocrit: 39.9 % (ref 37.5–51.0)
Hemoglobin: 13.6 g/dL (ref 13.0–17.7)
MCH: 26.2 pg — ABNORMAL LOW (ref 26.6–33.0)
MCHC: 34.1 g/dL (ref 31.5–35.7)
MCV: 77 fL — ABNORMAL LOW (ref 79–97)
Platelets: 247 10*3/uL (ref 150–379)
RBC: 5.19 x10E6/uL (ref 4.14–5.80)
RDW: 15.7 % — ABNORMAL HIGH (ref 12.3–15.4)
WBC: 5.3 10*3/uL (ref 3.4–10.8)

## 2016-07-27 LAB — LIPID PANEL
Chol/HDL Ratio: 2.1 ratio (ref 0.0–5.0)
Cholesterol, Total: 117 mg/dL (ref 100–199)
HDL: 56 mg/dL (ref >39)
LDL Calculated: 52 mg/dL (ref 0–99)
Triglycerides: 46 mg/dL (ref 0–149)
VLDL Cholesterol Cal: 9 mg/dL (ref 5–40)

## 2016-07-31 NOTE — Progress Notes (Signed)
Cardiology Office Note    Date:  08/01/2016   ID:  [name], DOB 01/10/1956, MRN [id number]  PCP:  Evette Doffing, MD  Cardiologist: Seen by Dr. Acie Fredrickson and Dr. Martinique in 2014  CC: chest pain  History of Present Illness:  [name] is a 61 y.o. male with a history of GERD, bipolar disorder, generalized anxiety disorder, history of polysubstance abuse in remission, pancreatitis, OSA unable to tolerate CPAP, chronic back pain on PO dilauded, hep C that is in remission per patient who presents to clinic for evaluation of chest pain.   He has no history of CAD.  He had chest pain in 2003 but a cardiac catheterization was clean at that time. He was admitted in 09/2012 for exertional chest pain/dypsnea. His ECG showed TWI in V1-V3 so he was referred for nuclear stress test which was low risk with EF 70%. He saw Dr. Martinique in the office for follow up in 10/2012 and continued to complain of dyspnea. A 2D ECHO was ordered which showed normal LV function, mild LVH and G2DD. His dyspnea was felt to be non cardiac and likely from deconditioning.   He was seen in 12/2015 for abdominal pain. 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    Today he presents to clinic for evaluation of chest pain. Recently he was woken up with chest pain in the middle of the night over last month about 4 different times. It feels like a  tightness/cramp in the middle of his chest as well as his legs. It lasted at least 2-3 hours and was constant. No associated SOB or diaphoresis. No radiation. He took a dilaudid and muscle relaxer and it seemed to help and ease off by itself. He recently has not been doing as much activity due to worsening back pain. He used to do [place] and swim but hasn't done this for 3-4 months because of worsening pain. Back pain keeping him awake at night. No LE edema, orthopnea or PND. He has chronic dizziness from vertigo but no syncope. No blood in stool or urine. No  palpitations. He denies any exertional chest pain or sob but he is not very active 2/2 back pain. He is otherwise very healthy and tries to exercise as much as possible and eat very healthy.    Past Medical History:  Diagnosis Date  . Alcohol abuse   . Allergy   . Bipolar disorder ([place])   . Chronic pain    lower back pain, "that has progressed"  . Depression   . Diverticulosis   . Dyspnea   . Hepatitis C    hx. of"states he no longer has"" is clear".  . History of recreational drug use    "cocaine, marijuana, polysubstance" quit 1987.  Marland Kitchen HLD (hyperlipidemia)   . Hypertension   . Panic attack   . Sleep apnea    no cpap used,still has machine(use rarely)    Past Surgical History:  Procedure Laterality Date  . CARDIAC CATHETERIZATION  2003   No CAD  . EUS N/A 06/23/2014   Procedure: UPPER ENDOSCOPIC ULTRASOUND (EUS) LINEAR;  Surgeon: Milus Banister, MD;  Location: [facility] ENDOSCOPY;  Service: Endoscopy;  Laterality: N/A;  . sinus surgery    . TONSILLECTOMY      Current Medications: Outpatient Medications Prior to Visit  Medication Sig Dispense Refill  . ALPRAZolam (XANAX) 0.5 MG tablet Take 0.5 mg by mouth 3 (three) times daily as needed  for anxiety.    Marland Kitchen aspirin EC 81 MG tablet Take 1 tablet (81 mg total) by mouth daily. 30 tablet 2  . cholecalciferol (VITAMIN D) 1000 UNITS tablet Take 2,000 Units by mouth daily.    . Cyanocobalamin (VITAMIN B-12 PO) Take 1 tablet by mouth daily.    Marland Kitchen HYDROmorphone (DILAUDID) 2 MG tablet Take 2 mg by mouth every 4 (four) hours as needed for severe pain.   0  . lamoTRIgine (LAMICTAL) 200 MG tablet Take 200 mg by mouth at bedtime.     . methocarbamol (ROBAXIN) 500 MG tablet Take 1 tablet (500 mg total) by mouth 2 (two) times daily. 20 tablet 0  . mirtazapine (REMERON) 45 MG tablet Take 45 mg by mouth at bedtime.    . Multiple Vitamin (MULTIVITAMIN WITH MINERALS) TABS Take 1 tablet by mouth daily.     . nitroGLYCERIN (NITROSTAT) 0.3 MG SL tablet  Place 1 tablet (0.3 mg total) under the tongue every 5 (five) minutes as needed for chest pain. 30 tablet 0  . ondansetron (ZOFRAN) 4 MG tablet Take 1 tablet (4 mg total) by mouth every 8 (eight) hours as needed for nausea or vomiting. 10 tablet 0  . pantoprazole (PROTONIX) 40 MG tablet Take 1 tablet (40 mg total) by mouth daily. 30 tablet 0  . polyethylene glycol powder (GLYCOLAX/MIRALAX) powder Take 17 g by mouth daily as needed (constipation). 500 g 1  . diclofenac (VOLTAREN) 75 MG EC tablet Take 1 tablet (75 mg total) by mouth 2 (two) times daily. 20 tablet 0  . GRAPE SEED EXTRACT PO Take 2 capsules by mouth daily.    . Glucosamine-Chondroitin (GLUCOSAMINE CHONDR COMPLEX PO) Take 1 tablet by mouth daily.    . TURMERIC PO Take 1 tablet by mouth daily.     Facility-Administered Medications Prior to Visit  Medication Dose Route Frequency Provider Last Rate Last Dose  . 0.9 %  sodium chloride infusion   Intravenous Continuous Montez Morita, MD 20 mL/hr at 09/15/12 1635 500 mL at 09/15/12 1635     Allergies:   Morphine and related   Social History   Social History  . Marital status: Divorced    Spouse name: N/A  . Number of children: 0  . Years of education: GED   Occupational History  . DISABLED   .      Social History Main Topics  . Smoking status: Former Smoker    Packs/day: 2.00    Years: 16.00    Types: Cigarettes    Quit date: 04/08/2002  . Smokeless tobacco: Never Used  . Alcohol use No     Comment: Quit in 1987  . Drug use: No     Comment: Use to take cocaine, marijuana, per patient everything. Stopped in 1987.  Marland Kitchen Sexual activity: Not Currently    Partners: Female   Other Topics Concern  . None   Social History Narrative   Patient lives at home alone.    Disabled.   Education GED    Both handed.   Caffeine two cups of coffee daily.           Family History:  The patient's family history includes Breast cancer in his maternal grandmother; COPD in his  mother; Diabetes in his maternal grandfather; Heart disease in his father and mother; Kidney disease in his mother; Lupus in his mother.      ROS:   Please see the history of present illness.    ROS  All other systems reviewed and are negative.   PHYSICAL EXAM:   VS:  BP 126/70   Pulse 67   Ht 5' 6.5" (1.689 m)   Wt 159 lb (72.1 kg)   SpO2 96%   BMI 25.28 kg/m    GEN: Well nourished, well developed, in no acute distress  HEENT: normal  Neck: no JVD, carotid bruits, or masses Cardiac: RRR; no murmurs, rubs, or gallops,no edema  Respiratory:  clear to auscultation bilaterally, normal work of breathing GI: soft, nontender, nondistended, + BS MS: no deformity or atrophy  Skin: warm and dry, no rash Neuro:  Alert and Oriented x 3, Strength and sensation are intact Psych: euthymic mood, full affect   Wt Readings from Last 3 Encounters:  08/01/16 159 lb (72.1 kg)  07/26/16 157 lb (71.2 kg)  07/16/16 159 lb 3.2 oz (72.2 kg)      Studies/Labs Reviewed:   EKG:  EKG is NOT ordered today.   Recent Labs: 09/06/2015: ALT 20; Hemoglobin 14.6 07/26/2016: BUN 10; Creatinine, Ser 0.86; Platelets 247; Potassium 4.8; Sodium 143   Lipid Panel    Component Value Date/Time   CHOL 117 07/26/2016 1107   TRIG 46 07/26/2016 1107   HDL 56 07/26/2016 1107   CHOLHDL 2.1 07/26/2016 1107   CHOLHDL 3.5 09/16/2012 0513   VLDL 33 09/16/2012 0513   LDLCALC 52 07/26/2016 1107   LDLDIRECT 33 08/25/2014 1416    Additional studies/ records that were reviewed today include:  09/2012: Myoview IMPRESSION: Normal pharmacologic stress nuclear scan.  No scar or ischemia. Normal wall motion.  This is a low risk scan.   2D ECHO: 11/04/2012 LV EF: 55% -  65% Study Conclusions - Left ventricle: The cavity size was normal. Wall thickness was increased in a pattern of mild LVH. Systolic function was normal. The estimated ejection fraction was in the range of 55% to 65%. Wall motion was normal;  there were no regional wall motion abnormalities. Features are consistent with a pseudonormal left ventricular filling pattern, with concomitant abnormal relaxation and increased filling pressure (grade 2 diastolic dysfunction). - Left atrium: The atrium was mildly dilated. - Atrial septum: No defect or patent foramen ovale was identified.   ASSESSMENT & PLAN:   Chest pain: chest pain is atypical for cardiac ischemia. Non exertional, wakes him from sleep and made better with pain pills/muscle relaxer. He had a clean cath in 2003 and normal myoview and echo in 2014. ECG had TWI in V1-V3 at the time of his low risk myoview in 2014. He does not have HTN, DMT2, HLD, obesity, tobacco abuse ( smoking shortly a long time ago). However, CT of abdomen did show some aortic arthrosclerosis. He is not very active 2/2 to back pain so difficult to get an exertional history. There is a large anxiety component. I had a long discussion with patient and offered continued monitoring vs stress testing. He would like to continue to monitor his symptoms. I have reassured him that his symptoms do not sound like cardiac ischemia. If he continues to have these symptoms, we can empirically treat him for possible vasospastic angina with imdur or CCB. He will let us know if sx return or worsen.   HTN: he denies a history of this. BP is normal today and he is not on any antihypertenives  Anxiety: continue benzos. He worries a lot about his health.   Bipolar disorder: continue current medical regimen   Medication Adjustments/Labs and Tests Ordered: Current medicines are  reviewed at length with the patient today.  Concerns regarding medicines are outlined above.  Medication changes, Labs and Tests ordered today are listed in the Patient Instructions below. Patient Instructions  Medication Instructions:  Your physician recommends that you continue on your current medications as directed. Please refer to the  Current Medication list given to you today.   Labwork: NONE ORDERED  Testing/Procedures: NONE ORDERED  Follow-Up: Your physician recommends that you schedule a follow-up appointment in: AS NEEDED   Any Other Special Instructions Will Be Listed Below (If Applicable).     If you need a refill on your cardiac medications before your next appointment, please call your pharmacy.  Mable Fill, PA-C  08/01/2016 12:07 PM    [facility] [address], [place], [place]  [address] Phone: [phone]; Fax: [phone]

## 2016-08-01 ENCOUNTER — Ambulatory Visit (INDEPENDENT_AMBULATORY_CARE_PROVIDER_SITE_OTHER): Payer: Medicare Other | Admitting: Physician Assistant

## 2016-08-01 ENCOUNTER — Encounter: Payer: Self-pay | Admitting: Physician Assistant

## 2016-08-01 VITALS — BP 126/70 | HR 67 | Ht 66.5 in | Wt 159.0 lb

## 2016-08-01 DIAGNOSIS — F411 Generalized anxiety disorder: Secondary | ICD-10-CM

## 2016-08-01 DIAGNOSIS — I10 Essential (primary) hypertension: Secondary | ICD-10-CM | POA: Diagnosis not present

## 2016-08-01 DIAGNOSIS — R079 Chest pain, unspecified: Secondary | ICD-10-CM | POA: Diagnosis not present

## 2016-08-01 NOTE — Patient Instructions (Signed)
Medication Instructions:  Your physician recommends that you continue on your current medications as directed. Please refer to the Current Medication list given to you today.   Labwork: NONE ORDERED  Testing/Procedures: NONE ORDERED  Follow-Up: Your physician recommends that you schedule a follow-up appointment in: AS NEEDED   Any Other Special Instructions Will Be Listed Below (If Applicable).     If you need a refill on your cardiac medications before your next appointment, please call your pharmacy.  Marland Kitchen

## 2016-08-02 ENCOUNTER — Encounter: Payer: Self-pay | Admitting: Family Medicine

## 2016-08-02 ENCOUNTER — Ambulatory Visit (INDEPENDENT_AMBULATORY_CARE_PROVIDER_SITE_OTHER): Payer: Medicare Other | Admitting: Family Medicine

## 2016-08-02 ENCOUNTER — Ambulatory Visit
Admission: RE | Admit: 2016-08-02 | Discharge: 2016-08-02 | Disposition: A | Discharge status: Home / Self Care | Payer: Medicare Other | Source: Ambulatory Visit | Attending: Family Medicine | Admitting: Family Medicine

## 2016-08-02 VITALS — BP 114/80 | HR 85 | Temp 98.1°F | Ht 67.0 in | Wt 159.2 lb

## 2016-08-02 DIAGNOSIS — R0602 Shortness of breath: Secondary | ICD-10-CM

## 2016-08-02 DIAGNOSIS — J029 Acute pharyngitis, unspecified: Secondary | ICD-10-CM | POA: Diagnosis not present

## 2016-08-02 MED ORDER — SUCRALFATE 1 G PO TABS: 1.0000 g | ORAL_TABLET | Freq: Three times a day (TID) | ORAL | 0 refills | Status: DC

## 2016-08-02 NOTE — Assessment & Plan Note (Addendum)
Symptoms consistent GERD. On PPI. Has dry cough. --Prescribed Carafate for cough --If cough and sore throat persists, would consider switching PPI --Consider GI referral if symptoms persist despite changing therapy

## 2016-08-02 NOTE — Progress Notes (Signed)
Subjective:   Patient ID: [name]    DOB: [birth year], 61 y.o. male   MRN: [id number]  CC: "Breathing difficulty"  HPI: [name] is a 61 y.o. male who presents to clinic today for SDA concerning breathing difficulty and sore throat. Problems discussed today are as follows:  Shortest of breath: Patient states he has been experiencing worsening shortness of breath for 3-4 months. He notices after he had more difficulty breathing after running laps in the pool. He denies chest pain or palpitations. He has been expressing an occasional cough with these symptoms. Patient compliant with CPAP for OSA. Does not have history of COPD and not currently on albuterol. Was seen by cardiologist last week with negative workup.  Sore throat: Onset 2-3 months ago, however continues to aggravate patient. Patient has made efforts to avoid eating certain foods including red meats to help prevent reflux symptoms. He has currently taken PPI daily without improvement. Says he was evaluated by GI several years ago with an EGD for pancreatitis. Denies change in weight or URI symptoms.  ROS: complete ROS performed, see HPI for pertinent ROS.  [place]: Hypertension, OSA, hepatitis C, depression, bipolar disorder, anxiety, history of alcohol use disorder, chronic lower back pain. Smoking status reviewed. Medications reviewed.  Objective:   BP 114/80 (BP Location: Left Arm, Patient Position: Sitting, Cuff Size: Normal)   Pulse 85   Temp 98.1 F (36.7 C) (Oral)   Ht 5\' 7"  (1.702 m)   Wt 159 lb 3.2 oz (72.2 kg)   SpO2 97%   BMI 24.93 kg/m  Vitals and nursing note reviewed.  General: well nourished, well developed, in no acute distress with non-toxic appearance HEENT: normocephalic, atraumatic, moist mucous membranes Neck: supple, non-tender without lymphadenopathy CV: regular rate and rhythm without murmurs, rubs, or gallops, no lower extremity edema Lungs: clear to auscultation bilaterally with normal work  of breathing Abdomen: soft, non-tender, non-distended, no masses or organomegaly palpable, normoactive bowel sounds Skin: warm, dry, no rashes or lesions, cap refill < 2 seconds Extremities: warm and well perfused, normal tone  Assessment & Plan:   SOB (shortness of breath) Concerning for underlying untreated COPD or malignancy given smoking history. Dry cough and shortness of breath with exercise. --Will obtain PFTs to assess pulmonary function --Will obtain CXR for screening purposes  Sore throat Symptoms consistent GERD. On PPI. Has dry cough. --Prescribed Carafate for cough --If cough and sore throat persists, would consider switching PPI --Consider GI referral if symptoms persist despite changing therapy  Orders Placed This Encounter  Procedures  . DG Chest 2 View    Standing Status:   Future    Number of Occurrences:   1    Standing Expiration Date:   10/02/2017    Order Specific Question:   Reason for Exam (SYMPTOM  OR DIAGNOSIS REQUIRED)    Answer:   SOB    Order Specific Question:   Preferred imaging location?    Answer:   [facility]    Order Specific Question:   Radiology Contrast Protocol - do NOT remove file path    Answer:   [email or web address]   Meds ordered this encounter  Medications  . sucralfate (CARAFATE) 1 g tablet    Sig: Take 1 tablet (1 g total) by mouth 4 (four) times daily -  with meals and at bedtime.    Dispense:  90 tablet    Refill:  0    Harriet Butte, [place], PGY-1 08/02/2016  5:24 PM

## 2016-08-02 NOTE — Patient Instructions (Signed)
Thank you for coming in to see [place] today. Please see below to review our plan for today's visit.  1. I want to get a chest x-ray due to your history of smoking and 4 months of shortness of breath. I will notify you of the results of this test. The meantime please continue taking her CPAP nightly. 2. Your sore throat is likely due to reflux. You're currently on her tonics daily. Please continue taking this as prescribed. I have also given you a prescription for Carafate to help soothe your symptoms. Take this with meals . 3. Return to clinic in 3 months.  Please call the clinic at [phone] if your symptoms worsen or you have any concerns. It was my pleasure to see you. -- Harriet Butte, [place], PGY-1

## 2016-08-02 NOTE — Assessment & Plan Note (Addendum)
Concerning for underlying untreated COPD or malignancy given smoking history. Dry cough and shortness of breath with exercise. --Will obtain PFTs to assess pulmonary function --Will obtain CXR for screening purposes

## 2016-08-08 ENCOUNTER — Encounter: Payer: Self-pay | Admitting: Internal Medicine

## 2016-08-08 ENCOUNTER — Encounter: Payer: Self-pay | Admitting: Family Medicine

## 2016-08-12 ENCOUNTER — Ambulatory Visit (INDEPENDENT_AMBULATORY_CARE_PROVIDER_SITE_OTHER): Payer: Medicare Other | Admitting: Student

## 2016-08-12 ENCOUNTER — Encounter: Payer: Self-pay | Admitting: Student

## 2016-08-12 VITALS — BP 140/84 | HR 68 | Temp 98.3°F | Ht 67.0 in | Wt 161.0 lb

## 2016-08-12 DIAGNOSIS — K6289 Other specified diseases of anus and rectum: Secondary | ICD-10-CM

## 2016-08-12 NOTE — Progress Notes (Signed)
   Subjective:    Patient ID: [name], male    DOB: November 20, 1955, 61 y.o.   MRN: [id number]   CC: rectal pain  HPI: 61 y/o M presents for intermittent rectal pain  Rectal pain - occurs intermittently lasting from 15-30 mins - resolves on its own - he has some associated abdominal pain - he had a negative CT in 12/2015 for this abdominal pain - no current pain - last episode of abdominal pain and rectal pain 1 month ago - no N/V/D - has a normal colonoscopy in 2009 - denies constipation although he is on significant narcitics for chronic pain  Smoking status reviewed  Review of Systems  Per HPI, else denies chest pain, shortness of breath,     Objective:  BP 140/84   Pulse 68   Temp 98.3 F (36.8 C) (Oral)   Ht 5\' 7"  (1.702 m)   Wt 161 lb (73 kg)   SpO2 97%   BMI 25.22 kg/m  Vitals and nursing note reviewed  General: NAD Cardiac: RRR,  Respiratory: CTAB, normal effort Abdomen: soft, nontender, nondistended, no hepatic or splenomegaly. Bowel sounds present Rectal: normal external anus, anoscopy with no lesions, no tenderness on exam, no palpable abnormalities Skin: warm and dry, no rashes noted Neuro: alert and oriented, no focal deficits   Assessment & Plan:    Rectal pain Rectal pain with benign exam. Concerning for possible protalgia fugax given short duration of symptoms. He does report associated abdominal pain, however abdominal pain appears to be chronic for him - will refer to GI for consideration of scope - return precautions discussed - follow as needed    Alyssa A. Lincoln Brigham MD, [facility] Resident PGY-3 Pager [phone]

## 2016-08-12 NOTE — Patient Instructions (Signed)
Follow up as needed A referral was made for [facility], you should hear from then in the next 1-2 weeks Call the office at [phone]

## 2016-08-12 NOTE — Assessment & Plan Note (Signed)
Rectal pain with benign exam. Concerning for possible protalgia fugax given short duration of symptoms. He does report associated abdominal pain, however abdominal pain appears to be chronic for him - will refer to GI for consideration of scope - return precautions discussed - follow as needed

## 2016-08-13 ENCOUNTER — Telehealth: Payer: Self-pay | Admitting: Gastroenterology

## 2016-08-13 NOTE — Telephone Encounter (Signed)
Called patient back, he states that the rectal pain is intermittent and today he is not having any of that type of pain. Pain he wanted to discuss is his chronic back pain.He saw his primary care physician yesterday states he discussed all his pain issues with her yesterday: patient states that he is having chest pain, SOB, hands/arms tingling, states they did a CXR, which was on 4/27.  Looking at 5/7 ov note, patient denied any chest pain, or shortness of breath at that visit. Told patient I was concerned about his chest pain, patient states he is on nitroglycerin for chest pain.Durward Fortes he needs to contact PCP or go to ED. Patient denies constipation or rectal pain today, denies any rectal bleeding. He does have a visit scheduled here on 5/11.

## 2016-08-13 NOTE — Telephone Encounter (Signed)
Naturally, as a GI doctor, I do not address chronic back pain.  I will see him as scheduled to address any GI issues

## 2016-08-16 ENCOUNTER — Ambulatory Visit (INDEPENDENT_AMBULATORY_CARE_PROVIDER_SITE_OTHER): Payer: Medicare Other | Admitting: Gastroenterology

## 2016-08-16 ENCOUNTER — Ambulatory Visit: Payer: Medicare Other | Admitting: Gastroenterology

## 2016-08-16 ENCOUNTER — Encounter: Payer: Self-pay | Admitting: Gastroenterology

## 2016-08-16 VITALS — BP 114/76 | HR 74 | Ht 66.0 in | Wt 157.0 lb

## 2016-08-16 DIAGNOSIS — K594 Anal spasm: Secondary | ICD-10-CM | POA: Diagnosis not present

## 2016-08-16 NOTE — Progress Notes (Signed)
     [facility] Progress Note  Chief Complaint: Rectal pain  Subjective  History:  This is a 61 year old man last seen by Dr. Deatra Ina in August 2016 for chronic abdominal pain and chronic pancreatitis. Patient had an unremarkable colonoscopy in February 2016 with the exception of some mild nonspecific left-sided mucosal erythema. He also had an unrevealing CT scan of the abdomen and pelvis September 2017.  He was sent by the resident clinic for reevaluation due to intermittent rectal pain. It will often occur overnight, will be sharp and intense and last about 10 minutes. While he has had some occasional constipation, most of the time this pain occurs without difficulty moving his bowels. He takes MiraLAX regularly and is on a multitude of medicines including chronic opioids and Xanax.   ROS: Cardiovascular:  no chest pain Respiratory: no dyspnea He has chronic back pain The patient's Past Medical, Family and Social History were reviewed and are on file in the EMR.  Objective:  Med list reviewed  Vital signs in last 24 hrs: Vitals:   08/16/16 1348  BP: 114/76  Pulse: 74    Physical Exam    HEENT: sclera anicteric, oral mucosa moist without lesions  Neck: supple, no thyromegaly, JVD or lymphadenopathy  Cardiac: RRR without murmurs, S1S2 heard, no peripheral edema  Pulm: clear to auscultation bilaterally, normal RR and effort noted  Abdomen: soft, No tenderness, with active bowel sounds. No guarding or palpable hepatosplenomegaly.  Skin; warm and dry, no jaundice or rash Rectal: Normal sphincter tone, no fissure, no palpable internal lesions or tenderness, heme-negative stool  Data: Prior studies as noted above  @ASSESSMENTPLANBEGIN @ Assessment: Encounter Diagnosis  Name Primary?  . 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 I think no further testing is necessary. It is not amenable to medicines. I reassured him.  Total time 15  minutes, over half spent in counseling and coordination of care.   Nelida Meuse III   CC: Dr. Marina Goodell, internal medicine resident clinic, [facility]

## 2016-08-16 NOTE — Patient Instructions (Signed)
If you are age 58 or older, your body mass index should be between 23-30. Your Body mass index is 25.34 kg/m. If this is out of the aforementioned range listed, please consider follow up with your Primary Care Provider.  If you are age 47 or younger, your body mass index should be between 19-25. Your Body mass index is 25.34 kg/m. If this is out of the aformentioned range listed, please consider follow up with your Primary Care Provider.   Thank you for choosing [facility]  Dr Wilfrid Lund III

## 2016-08-20 ENCOUNTER — Ambulatory Visit: Payer: Medicare Other | Admitting: Pharmacist

## 2016-08-27 ENCOUNTER — Other Ambulatory Visit (HOSPITAL_COMMUNITY): Payer: Self-pay | Admitting: Psychiatry

## 2016-08-29 ENCOUNTER — Ambulatory Visit: Payer: Medicare Other | Admitting: Gastroenterology

## 2016-10-14 IMAGING — US US ABDOMEN COMPLETE
1 series · 13 of 25 positions shown · non-contrast
Comparison: CT abdomen and pelvis December 06, 2014

CLINICAL DATA: Epigastric/mid abdominal region pain for 4 month

EXAM:
ABDOMEN ULTRASOUND COMPLETE

[Series 1: us abdomen complete · 0.35mm/px · 13 of 75 slices shown]
[im 1/75]
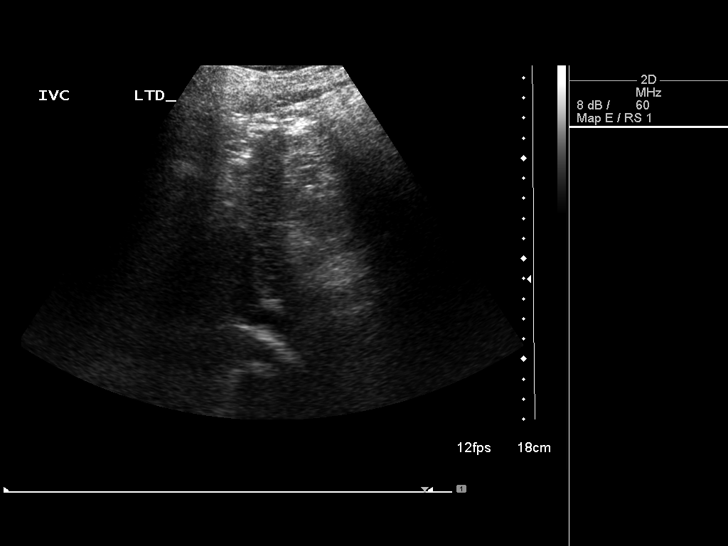
[im 7/75]
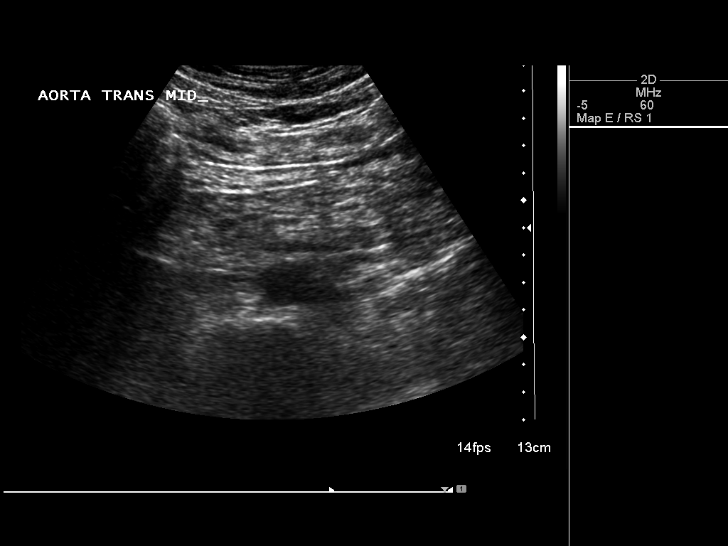
[im 13/75]
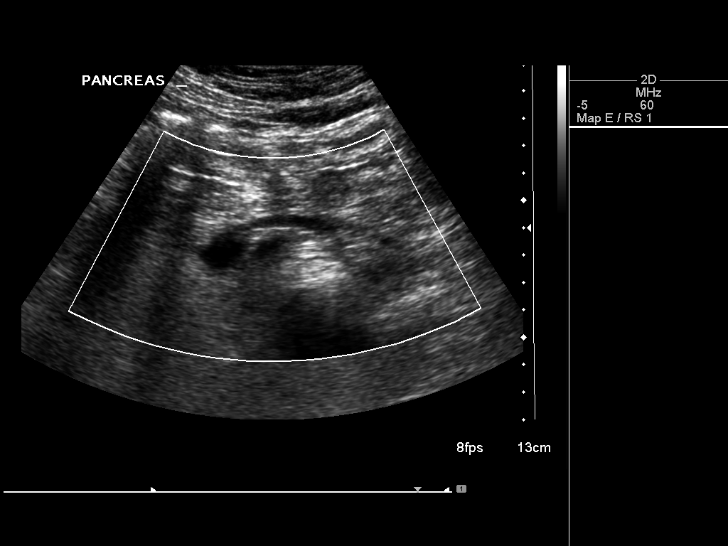
[im 19/75]
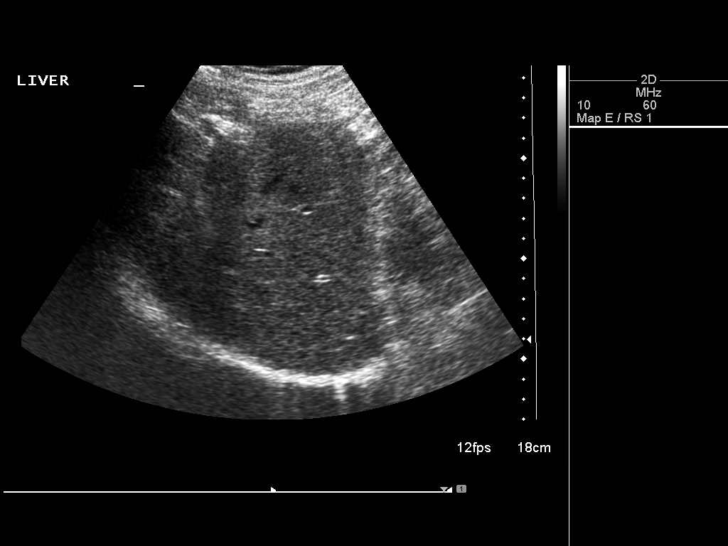
[im 25/75]
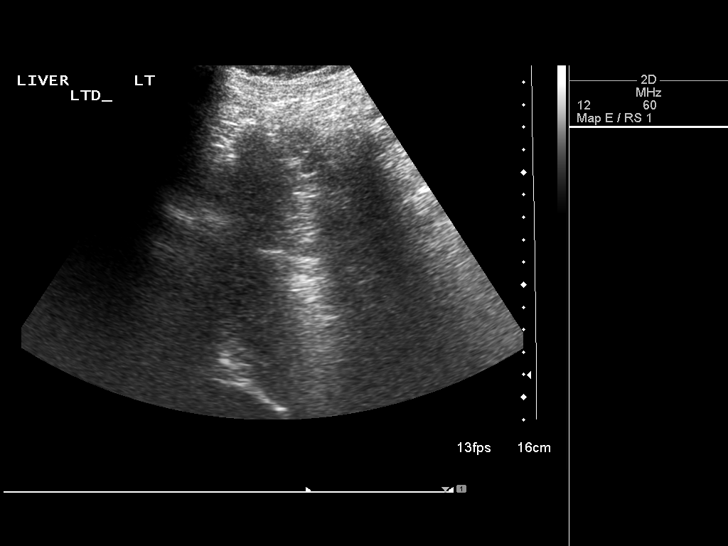
[im 31/75]
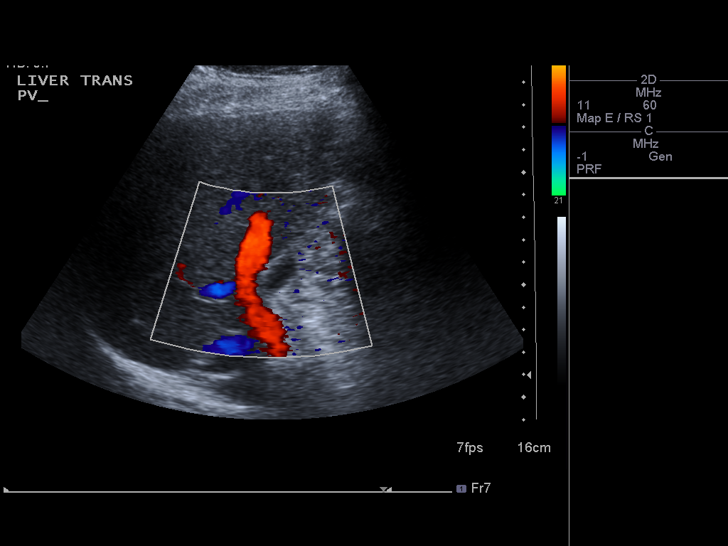
[im 38/75]
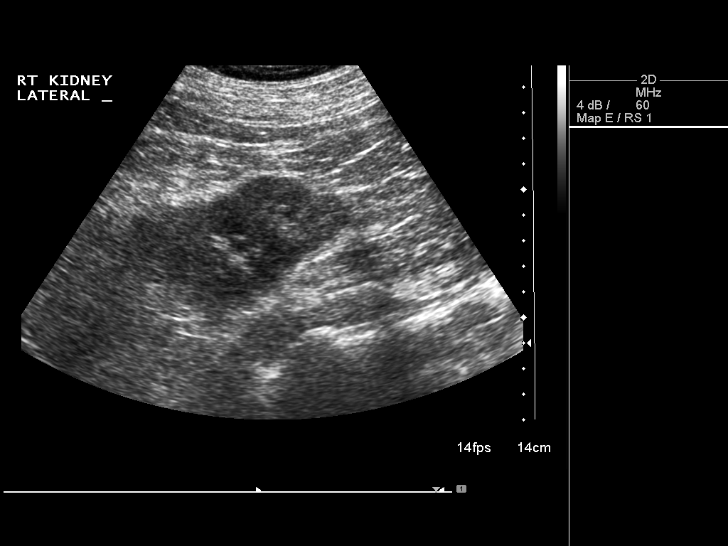
[im 44/75]
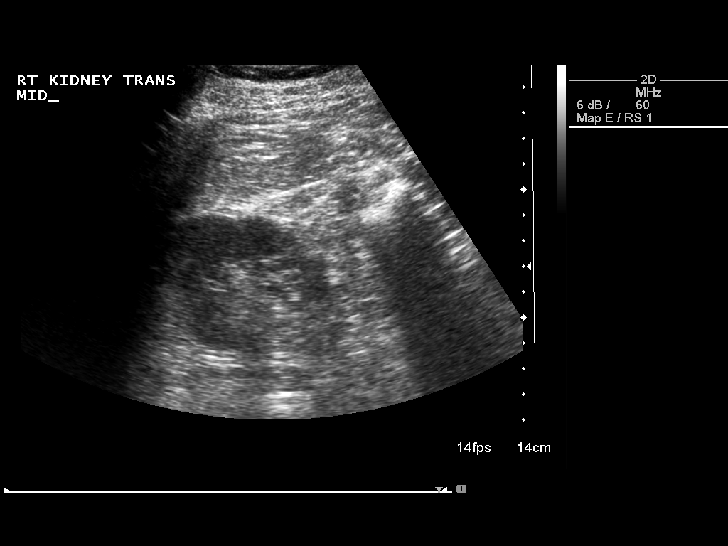
[im 50/75]
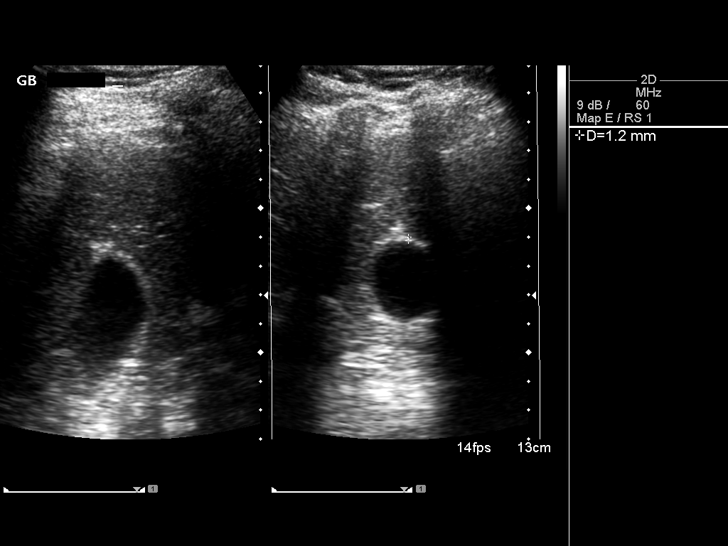
[im 56/75]
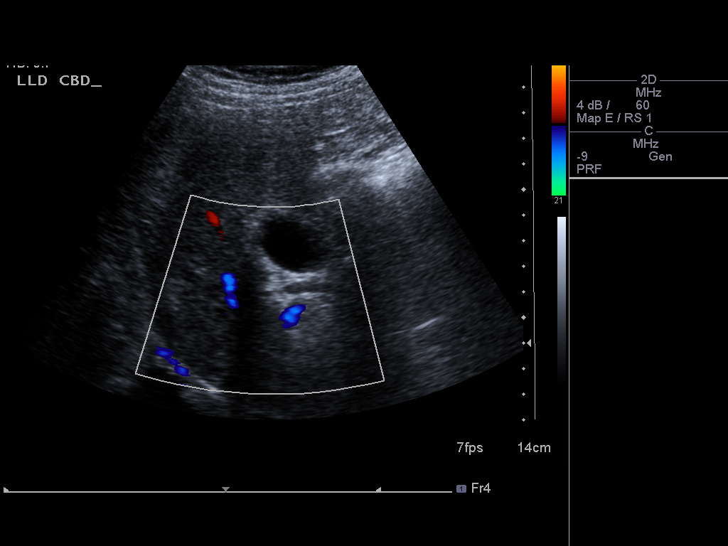
[im 62/75]
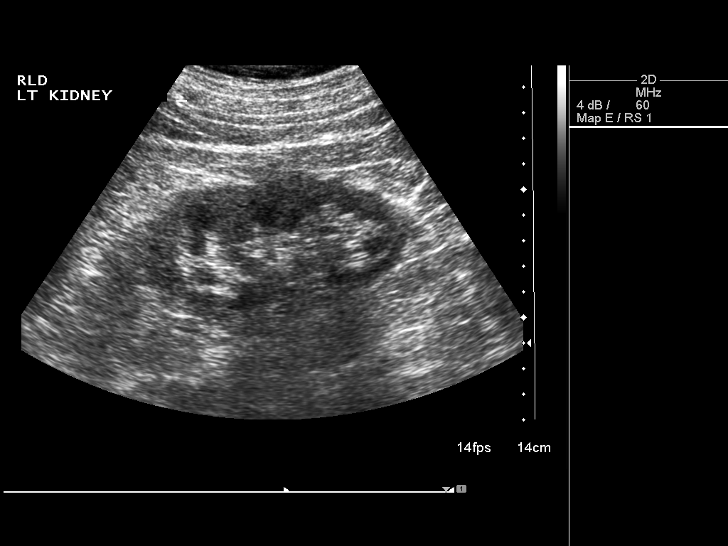
[im 68/75]
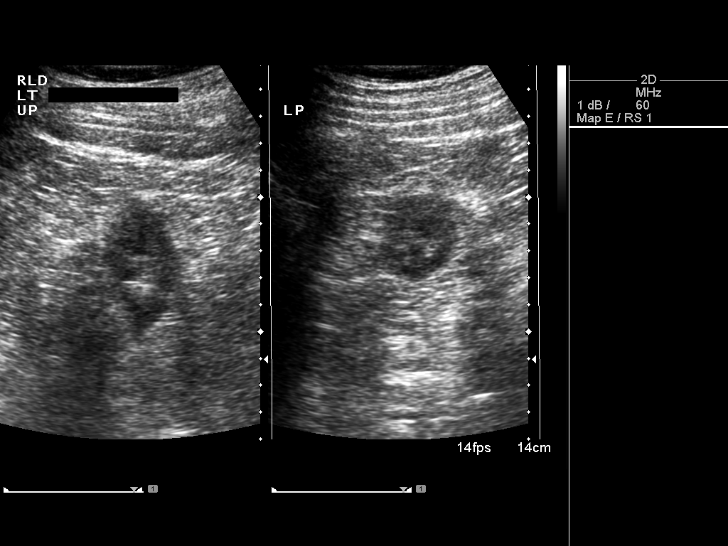
[im 75/75]
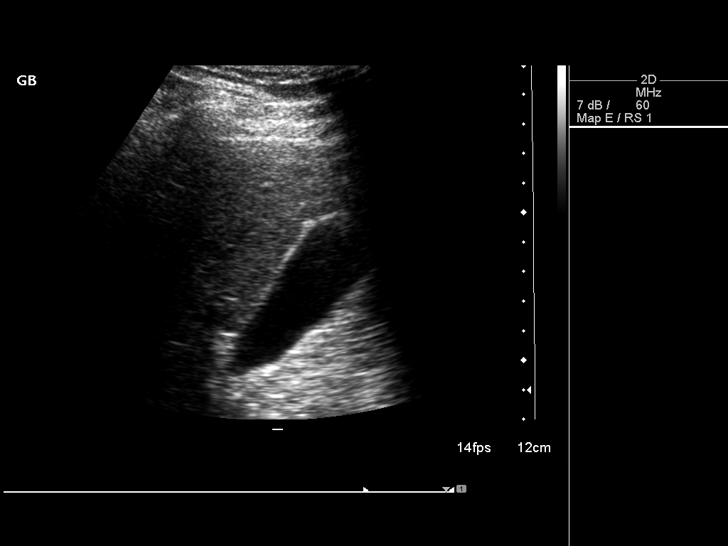

[13 of 25 positions shown; findings below may reference images not displayed]

FINDINGS: Gallbladder: Within the gallbladder, there is a 4 mm echogenic focus
which neither moves nor shadows, likely a small polyp. No echogenic
foci within the gallbladder are seen to move and shadow as is
expected with gallstones. There is no gallbladder wall thickening or
pericholecystic fluid. No sonographic Murphy sign noted by
sonographer.

Common bile duct: Diameter: 5 mm. No intrahepatic or extrahepatic
biliary duct dilatation.

Liver: No focal lesion identified. Within normal limits in
parenchymal echogenicity.

IVC: No abnormality visualized in regions that can be interrogated.
Portions of the inferior inferior vena cava are obscured by gas.

Pancreas: Visualized portion unremarkable. Much of the pancreas is
obscured by gas.

Spleen: Size and appearance within normal limits.

Right Kidney: Length: 11.0 cm. Echogenicity within normal limits. No
mass or hydronephrosis visualized.

Left Kidney: Length: 11.9 cm. Echogenicity within normal limits. No
mass or hydronephrosis visualized.

Abdominal aorta: No aneurysm visualized.

Other findings: No demonstrable ascites.
IMPRESSION: 4 mm presumed polyp in gallbladder. Gallbladder otherwise appears
normal.

Limited visualization of pancreas inferior vena cava. Visualized
portions of the studies appear normal.

Study otherwise unremarkable.

## 2016-10-16 ENCOUNTER — Encounter: Payer: Self-pay | Admitting: Physician Assistant

## 2016-11-05 ENCOUNTER — Ambulatory Visit (INDEPENDENT_AMBULATORY_CARE_PROVIDER_SITE_OTHER): Payer: Medicare Other | Admitting: Internal Medicine

## 2016-11-05 ENCOUNTER — Encounter: Payer: Self-pay | Admitting: Internal Medicine

## 2016-11-05 VITALS — BP 110/62 | HR 67 | Temp 97.4°F | Ht 66.0 in | Wt 154.0 lb

## 2016-11-05 DIAGNOSIS — R1013 Epigastric pain: Secondary | ICD-10-CM | POA: Diagnosis present

## 2016-11-05 DIAGNOSIS — G8929 Other chronic pain: Secondary | ICD-10-CM | POA: Diagnosis not present

## 2016-11-05 LAB — POCT H PYLORI SCREEN: H Pylori Screen, POC: POSITIVE

## 2016-11-05 MED ORDER — ONDANSETRON HCL 4 MG PO TABS: 4.0000 mg | ORAL_TABLET | Freq: Three times a day (TID) | ORAL | 0 refills | Status: DC | PRN

## 2016-11-05 NOTE — Assessment & Plan Note (Signed)
Has a history of H. Pylori in 2016 that was treated with triple therapy x 2 weeks. Also had an endoscopy to evaluate a pancreatic cyst that was benign. Normal colonoscopy in 2009. Has had recurring symptoms. Protonix and Zantac have been helping. No suspicion for GI malignancy without any weight loss or night sweats and with recently normal endoscopy/colonoscopy. - Will check POCT H. Pylori today - Advised dietary changes including cutting back on coffee and spicy foods - Refilled Zofran - If no improvement, could consider sending back to GI

## 2016-11-05 NOTE — Patient Instructions (Signed)
It was so nice to meet you!  I have refilled your Zofran. I will call you with the results of your H. Pylori testing.  -Dr. Brett Albino

## 2016-11-05 NOTE — Progress Notes (Signed)
   [facility] Phone: [phone]  Subjective:  Dave is a 61 year old male presenting to clinic with chronic abdominal pain for the last 3 years. The pain is located on either side of his umbilicus and sometimes in his epigastric area. The pain has been "giving him a fit" recently. He is taking Protonix and Zantac at home, which have been helping. He states the pain is a little bit better today. He notes the pain is better when he eats yogurt and worse when he drinks coffee. He has had H. Pylori in the past and he wonders if he has this again. He endorses associated nausea. No vomiting, no weight loss, no night sweats.  ROS: See HPI for pertinent positives and negatives  Past Medical History- HTN (diet controlled), anxiety, bipolar disorder, HLD, depression, hx alcohol abuse  Family history reviewed for today's visit. No changes.  Social history- patient is a former smoker. Hx of alcohol abuse.  Objective: BP 110/62   Pulse 67   Temp (!) 97.4 F (36.3 C) (Oral)   Ht 5\' 6"  (1.676 m)   Wt 154 lb (69.9 kg)   SpO2 97%   BMI 24.86 kg/m  Gen: NAD, alert, cooperative with exam GI: +BS, soft, non-tender, non-distended  Assessment/Plan: Chronic Epigastric Abdominal Pain: Has a history of H. Pylori in 2016 that was treated with triple therapy x 2 weeks. Also had an endoscopy to evaluate a pancreatic cyst that was benign. Normal colonoscopy in 2009. Has had recurring symptoms. Protonix and Zantac have been helping. No suspicion for GI malignancy without any weight loss or night sweats and with recently normal endoscopy/colonoscopy. - Will check POCT H. Pylori today - Advised dietary changes including cutting back on coffee and spicy foods - Refilled Zofran - If no improvement, could consider sending back to GI   [name], MD PGY-3

## 2016-11-06 ENCOUNTER — Other Ambulatory Visit: Payer: Self-pay | Admitting: Internal Medicine

## 2016-11-06 ENCOUNTER — Telehealth: Payer: Self-pay | Admitting: Internal Medicine

## 2016-11-06 DIAGNOSIS — G8929 Other chronic pain: Secondary | ICD-10-CM

## 2016-11-06 DIAGNOSIS — R1013 Epigastric pain: Principal | ICD-10-CM

## 2016-11-06 NOTE — Telephone Encounter (Signed)
Called patient to discuss his test results. He was seen yesterday for epigastric abdominal pain. Had a POCT H. Pylori test done that was positive; however, he had H. Pylori in 2016 that was treated with triple therapy. Will have patient come back for urea breath test and lipase. Future orders placed.  [name], MD

## 2016-11-07 ENCOUNTER — Other Ambulatory Visit: Payer: Medicare Other

## 2016-11-07 DIAGNOSIS — G8929 Other chronic pain: Secondary | ICD-10-CM

## 2016-11-07 DIAGNOSIS — R1013 Epigastric pain: Principal | ICD-10-CM

## 2016-11-08 ENCOUNTER — Other Ambulatory Visit: Payer: Self-pay | Admitting: Internal Medicine

## 2016-11-08 LAB — LIPASE: Lipase: 25 U/L (ref 13–78)

## 2016-11-08 LAB — H. PYLORI BREATH TEST: H pylori Breath Test: NEGATIVE

## 2016-11-11 ENCOUNTER — Telehealth: Payer: Self-pay | Admitting: *Deleted

## 2016-11-11 NOTE — Telephone Encounter (Signed)
Spoke with patient and he states that he is still having all of the same symptoms and has read about them and they are the same as h.pylori.  Patient would like to know what his next step needs to be.  He said that it uncomfortable all the time and it has gradually worsened in the past few months and wonders if he has ulcers.  Will forward to MD to advise. Jazmin Hartsell,CMA

## 2016-11-11 NOTE — Telephone Encounter (Signed)
-----   Message from Sela Hua, MD sent at 11/11/2016  3:56 PM EDT ----- Please let Mr. Linck know that his H. Pylori test was negative. Thank you!

## 2016-11-13 NOTE — Telephone Encounter (Signed)
Attempted to call patient this morning. Left voicemail asking patient to call us back. For his continued abdominal pain, I would recommend that patient follow-up with his gastroenterologist to see if he would need an endoscopy to make sure he doesn't have ulcers. Thanks!

## 2017-02-06 ENCOUNTER — Ambulatory Visit (INDEPENDENT_AMBULATORY_CARE_PROVIDER_SITE_OTHER): Payer: Medicare Other | Admitting: Internal Medicine

## 2017-02-06 ENCOUNTER — Encounter: Payer: Self-pay | Admitting: Internal Medicine

## 2017-02-06 DIAGNOSIS — R413 Other amnesia: Secondary | ICD-10-CM

## 2017-02-06 DIAGNOSIS — R42 Dizziness and giddiness: Secondary | ICD-10-CM

## 2017-02-06 NOTE — Patient Instructions (Addendum)
It was so nice to see you today!  I think you need to be evaluated by your heart doctor.  -Dr. Brett Albino

## 2017-02-10 NOTE — Assessment & Plan Note (Signed)
Patient concerned with gradually worsening memory over the last few years. Mini-cog performed in clinic today and patient passed this. Clock drawing completely normal. Able to recall 2 out of 3 words after clock drawing. Patient does not warrant further work-up at this time. Will monitor. If worsens, can consider [facility] vs referral to [facility]. We discussed the importance of regular exercise, eating a Mediterranean diet, and social interaction in the preservation of cognitive function.

## 2017-02-10 NOTE — Assessment & Plan Note (Signed)
Patient unable to describe this. Has been happening every time he is on the stationary bike over the last month. Associated with fatigue. Denies chest pain. Concern for cardiac etiology, given that it is associated with exercise. Advised patient to be seen by his cardiologist as soon as possible. If they do not feel this is cardiac in etiology, can consider further neurologic work-up. Cardiac and neuro exams completely normal in clinic today. We discussed return precautions and reasons to go to the ED.

## 2017-02-10 NOTE — Progress Notes (Signed)
   [facility] Phone: [phone]  Subjective:  [name] is a 61 year old male presenting to clinic with dizziness and memory problems.  Dizziness: Started 1 month ago. Occurs every time he rides a stationary bike. Starts about 10 minutes into riding the bike. He is unable to describe the dizziness further. He is unsure if he felt like the room was spinning or if he felt like he was going to pass out. He felt like he couldn't get up off the bike. He also feels very fatigued when this occurs. He tried cutting back on his pain medication, but this hasn't helped. Has had chest pain with exercising in the past, but he doesn't feel like this has been happening recently. Denies palpitations. No LOC. No syncope. No chest pain, dizziness, or fatigue with walking around. Apparently he has talked to his cardiologist's office about this over the phone and was told to come in to be evaluated. He has not been in to see them yet.  Memory Problems: Has been gradually worsening over the years. Feels like he can't remember conversations or small things that happen. Feels like he can't focus on anything. Has thought that this was related to his anxiety in the past. He is very worried about his memory. Not having any difficulty taking care of himself at home.  ROS: See HPI for pertinent positives and negatives  Past Medical History- HTN, hepatitis C, bipolar disorder, anxiety, chronic low back pain, chronic epigastric abdominal pain, HLD  Family history reviewed for today's visit. No changes.  Social history- patient is a former smoker  Objective: BP 132/74   Pulse 86   Temp 97.7 F (36.5 C) (Oral)   Wt 161 lb (73 kg)   SpO2 94%   BMI 25.99 kg/m  Gen: NAD, alert, cooperative with exam HEENT: NCAT, EOMI, MMM, PERRLA Neck: FROM, supple CV: RRR, no murmur, no carotid bruits Resp: CTABL, no wheezes, normal work of breathing GI: SNTND, BS present, no guarding or organomegaly Msk: No  edema, warm, normal tone, moves UE/LE spontaneously Neuro: Alert and oriented, CN 2-12 intact, 5/5 muscle strength in upper and lower extremities bilaterally, sensation intact to light touch throughout, reflexes normal and symmetric, normal finger-to-nose testing. Skin: No rashes, no lesions Psych: Appropriate behavior  Mini-cog- passed  Assessment/Plan: Dizziness: Patient unable to describe this. Has been happening every time he is on the stationary bike over the last month. Associated with fatigue. Denies chest pain. Concern for cardiac etiology, given that it is associated with exercise. Advised patient to be seen by his cardiologist as soon as possible. If they do not feel this is cardiac in etiology, can consider further neurologic work-up. Cardiac and neuro exams completely normal in clinic today. We discussed return precautions and reasons to go to the ED.  Memory Problems: Patient concerned with gradually worsening memory over the last few years. Mini-cog performed in clinic today and patient passed this. Clock drawing completely normal. Able to recall 2 out of 3 words after clock drawing. Patient does not warrant further work-up at this time. Will monitor. If worsens, can consider [facility] vs referral to [facility]. We discussed the importance of regular exercise, eating a Mediterranean diet, and social interaction in the preservation of cognitive function.    [name], MD PGY-3

## 2017-02-11 NOTE — Progress Notes (Signed)
Cardiology Office Note:    Date:  02/12/2017   ID:  [name], DOB 1955-10-25, MRN [id number]  PCP:  Samuel Hua, MD  Cardiologist:  Dr. Peter Martinique    Referring MD: Samuel Hua, MD   Chief Complaint  Patient presents with  . Dizziness    History of Present Illness:    [name] is a 61 y.o. male with a hx of bipolar d/o, anxiety, prior polysubstance abuse, obstructive sleep apnea, pancreatitis, chronic back pain, HCV.  Cardiac Catheterization in 2003 demonstrated normal coronary arteries.  Last seen in 07/2016.    [name] presents for evaluation of dizziness.  He is here alone today.  His symptoms of dizziness are somewhat difficult to describe.  He has noted it for probably the last year.  It has been worse over the last few months.  He has been somewhat sedentary due to chronic back pain.  He recently started to go to the gym and ride a stationary bike.  After riding a bike for 10 minutes, he will get lightheaded.  He thought it was because he has been out of shape.  He has also noted lightheadedness with symptoms of anxiety.  He feels that he can bring on dizziness if he does any activity long enough.  He also describes spinning sensation when lying flat.  He has not really noted orthostatic intolerance.  He denies frank syncope or falling.  He does get chest pain at times.  This seems to occur with activities.  He has noted it more in the last 6 months.  He also has chest discomfort associated with anxiety.  He has a long history of shortness of breath.  He feels that this is worse in the last 2-3 months.  He denies orthopnea, PND or edema.  Prior CV studies:   The following studies were reviewed today:  Echo 11/04/12 Mild LVH, EF 55-65, no RWMA, Gr 2 DD, mild LAE  Nuclear stress test 09/16/12 IMPRESSION: Normal pharmacologic stress nuclear scan.  No scar or ischemia. Normal wall motion.  This is a low risk scan.  Cardiac Catheterization 01/2002 EF 50-55 Normal  coronary arteries    Past Medical History:  Diagnosis Date  . Alcohol abuse   . Allergy   . Bipolar disorder ([place])   . Chronic pain    lower back pain, "that has progressed"  . Depression   . Diverticulosis   . Dyspnea   . Hepatitis C    hx. of"states he no longer has"" is clear".  . History of recreational drug use    "cocaine, marijuana, polysubstance" quit 1987.  [name] HLD (hyperlipidemia)   . Hypertension   . Pancreatitis 2015  . Panic attack   . Sleep apnea    no cpap used,still has machine(use rarely)    Past Surgical History:  Procedure Laterality Date  . CARDIAC CATHETERIZATION  2003   No CAD  . sinus surgery    . TONSILLECTOMY      Current Medications: Current Meds  Medication Sig  . aspirin EC 81 MG tablet Take 1 tablet (81 mg total) by mouth daily.  . cholecalciferol (VITAMIN D) 1000 UNITS tablet Take 2,000 Units by mouth daily.  . Cyanocobalamin (VITAMIN B-12 PO) Take 1 tablet by mouth daily.  . Glucosamine-Chondroitin (GLUCOSAMINE CHONDR COMPLEX PO) Take 1 tablet by mouth daily.  [name] HYDROmorphone (DILAUDID) 4 MG tablet Take 4 mg by mouth as needed. Anxiety  . lamoTRIgine (LAMICTAL) 200 MG tablet  Take 200 mg by mouth at bedtime.   . mirtazapine (REMERON) 45 MG tablet Take 45 mg by mouth at bedtime.  . Multiple Vitamin (MULTIVITAMIN WITH MINERALS) TABS Take 1 tablet by mouth daily.   . nitroGLYCERIN (NITROSTAT) 0.3 MG SL tablet Place 1 tablet (0.3 mg total) under the tongue every 5 (five) minutes as needed for chest pain.  [name] ondansetron (ZOFRAN) 4 MG tablet Take 1 tablet (4 mg total) by mouth every 8 (eight) hours as needed for nausea or vomiting.  . polyethylene glycol powder (GLYCOLAX/MIRALAX) powder Take 17 g by mouth daily as needed (constipation).  . sucralfate (CARAFATE) 1 g tablet Take 1 tablet (1 g total) by mouth 4 (four) times daily -  with meals and at bedtime.  . TURMERIC PO Take 1 tablet by mouth daily.     Allergies:   Morphine and related    Social History   Tobacco Use  . Smoking status: Former Smoker    Packs/day: 2.00    Years: 16.00    Pack years: 32.00    Types: Cigarettes    Last attempt to quit: 04/08/2002    Years since quitting: 14.8  . Smokeless tobacco: Never Used  Substance Use Topics  . Alcohol use: No    Alcohol/week: 0.0 oz    Comment: Quit in 1987  . Drug use: No    Comment: Use to take cocaine, marijuana, per patient everything. Stopped in 1987.     Family Hx: The patient's family history includes Breast cancer in his maternal grandmother; COPD in his mother; Diabetes in his maternal grandfather; Heart disease in his father and mother; Kidney disease in his mother; Lupus in his mother.  ROS:   Please see the history of present illness.    Review of Systems  Constitution: Positive for chills and malaise/fatigue.  Eyes: Positive for visual disturbance.  Cardiovascular: Positive for chest pain and dyspnea on exertion.  Respiratory: Positive for snoring.   Musculoskeletal: Positive for back pain, joint pain and myalgias.  Gastrointestinal: Positive for nausea and vomiting.  Neurological: Positive for dizziness, headaches and loss of balance.  Psychiatric/Behavioral: Positive for depression. The patient is nervous/anxious.    All other systems reviewed and are negative.   EKGs/Labs/Other Test Reviewed:    EKG:  EKG is  ordered today.  The ekg ordered today demonstrates normal sinus rhythm, heart rate 75, left axis deviation, QTC 444 ms, no significant change since prior tracing  Recent Labs: 07/26/2016: BUN 10; Creatinine, Ser 0.86; Hemoglobin 13.6; Platelets 247; Potassium 4.8; Sodium 143   Recent Lipid Panel Lab Results  Component Value Date/Time   CHOL 117 07/26/2016 11:07 AM   TRIG 46 07/26/2016 11:07 AM   HDL 56 07/26/2016 11:07 AM   CHOLHDL 2.1 07/26/2016 11:07 AM   CHOLHDL 3.5 09/16/2012 05:13 AM   LDLCALC 52 07/26/2016 11:07 AM   LDLDIRECT 33 08/25/2014 02:16 PM    Physical  Exam:    VS:  BP 138/84   Pulse 75   Ht 5\' 7"  (1.702 m)   Wt 166 lb 6.4 oz (75.5 kg)   SpO2 98%   BMI 26.06 kg/m    Orthostatic VS for the past 24 hrs (Last 3 readings):  BP- Lying Pulse- Lying BP- Sitting Pulse- Sitting BP- Standing at 0 minutes Pulse- Standing at 0 minutes BP- Standing at 3 minutes Pulse- Standing at 3 minutes  02/12/17 0847 (!) 136/95 66 132/84 63 130/90 65 (!) 129/95 70  Wt Readings from Last 3 Encounters:  02/12/17 166 lb 6.4 oz (75.5 kg)  02/06/17 161 lb (73 kg)  11/05/16 154 lb (69.9 kg)     Physical Exam  Constitutional: He is oriented to person, place, and time. He appears well-developed and well-nourished. No distress.  HENT:  Head: Normocephalic and atraumatic.  Neck: No JVD present. Carotid bruit is not present.  Cardiovascular: Normal rate and regular rhythm.  No murmur heard. Pulmonary/Chest: Effort normal. He has no rales.  Abdominal: Soft.  Musculoskeletal: He exhibits no edema.  Neurological: He is alert and oriented to person, place, and time.  Skin: Skin is warm and dry.    ASSESSMENT:    1. Dizziness   2. Other chest pain   3. Shortness of breath   4. Hyperlipidemia, unspecified hyperlipidemia type   5. HYPERTENSION, BENIGN - Diet Controlled    PLAN:    In order of problems listed above:  1.  Dizziness Etiology of his symptoms are not entirely clear.  He denies frank syncope.  It seems to come on with activity.  He has also had symptoms of vertigo.  He does take several medications that can cause dizziness.  I have asked him to follow-up with his pain specialist and psychiatrist to discuss these further.  Since he does have symptoms with activity, we will need to rule out a cardiac ED.  He has noted high blood pressures at times when he feels dizzy.  -Otain echocardiogram  -Obtain nuclear stress test  -Obtain 30-day event monitor  -Monitor blood pressure and bring recordings to next office visit  -Orthostatic vital signs  today were unremarkable  -Review pain meds and bipolar meds with prescribing providers   2.  Other chest pain  Chest discomfort is somewhat atypical for ischemia.  He cannot walk on a treadmill due to his back pain.  I will obtain a Lexiscan Myoview to rule out ischemia.  3.  Shortness of breath He has a long history of shortness of breath.  It seems to be worse over the last several months.  He has noted dizziness with activity at times and also has chest discomfort.  I will obtain an echocardiogram to assess ejection fraction and rule out structural heart disease.  4.  HYPERTENSION, BENIGN - Diet Controlled Blood pressure is borderline today.  He has noted some elevated blood pressures at the times he has been dizzy.  However, this has occurred after activity.  I have asked him to monitor his blood pressures over the next several weeks and bring those readings to his next office visit.   Dispo:  Return in about 8 weeks (around 04/09/2017) for Follow up after testing w/ Dr Martinique.   Medication Adjustments/Labs and Tests Ordered: Current medicines are reviewed at length with the patient today.  Concerns regarding medicines are outlined above.  Tests Ordered: Orders Placed This Encounter  Procedures  . Cardiac event monitor  . Myocardial Perfusion Imaging  . EKG 12-Lead  . ECHOCARDIOGRAM COMPLETE   Medication Changes: No orders of the defined types were placed in this encounter.   Signed, Richardson Dopp, PA-C  02/12/2017 9:08 AM    [facility] [address], [place], [place]  [address] Phone: [phone]; Fax: [phone]

## 2017-02-12 ENCOUNTER — Encounter: Payer: Self-pay | Admitting: Physician Assistant

## 2017-02-12 ENCOUNTER — Ambulatory Visit (INDEPENDENT_AMBULATORY_CARE_PROVIDER_SITE_OTHER): Payer: Medicare Other | Admitting: Physician Assistant

## 2017-02-12 VITALS — BP 138/84 | HR 75 | Ht 67.0 in | Wt 166.4 lb

## 2017-02-12 DIAGNOSIS — R42 Dizziness and giddiness: Secondary | ICD-10-CM | POA: Diagnosis not present

## 2017-02-12 DIAGNOSIS — R0789 Other chest pain: Secondary | ICD-10-CM

## 2017-02-12 DIAGNOSIS — I10 Essential (primary) hypertension: Secondary | ICD-10-CM | POA: Diagnosis not present

## 2017-02-12 DIAGNOSIS — R0602 Shortness of breath: Secondary | ICD-10-CM | POA: Diagnosis not present

## 2017-02-12 DIAGNOSIS — E785 Hyperlipidemia, unspecified: Secondary | ICD-10-CM

## 2017-02-12 NOTE — Patient Instructions (Signed)
Your physician recommends that you continue on your current medications as directed. Please refer to the Current Medication list given to you today.   Your physician has requested that you have an echocardiogram. Echocardiography is a painless test that uses sound waves to create images of your heart. It provides your doctor with information about the size and shape of your heart and how well your heart's chambers and valves are working. This procedure takes approximately one hour. There are no restrictions for this procedure.    Your physician has requested that you have a lexiscan myoview. For further information please visit [email or web address]. Please follow instruction sheet, as given.  [place] physician has recommended that you wear an event monitor. Event monitors are medical devices that record the heart's electrical activity. Doctors most often [place] these monitors to diagnose arrhythmias. Arrhythmias are problems with the speed or rhythm of the heartbeat. The monitor is a small, portable device. You can wear one while you do your normal daily activities. This is usually used to diagnose what is causing palpitations/syncope (passing out). [place] physician recommends that you schedule a follow-up appointment in:   6 - 8 WEEKS WITH DR Martinique   TAKE B/P A FEW TIMES A WEEK AND BRING READINGS TO NEXT OFFICE VISIT

## 2017-02-13 ENCOUNTER — Encounter: Payer: Self-pay | Admitting: Internal Medicine

## 2017-02-13 ENCOUNTER — Other Ambulatory Visit: Payer: Self-pay

## 2017-02-13 ENCOUNTER — Ambulatory Visit (INDEPENDENT_AMBULATORY_CARE_PROVIDER_SITE_OTHER): Payer: Medicare Other | Admitting: Internal Medicine

## 2017-02-13 DIAGNOSIS — J029 Acute pharyngitis, unspecified: Secondary | ICD-10-CM | POA: Diagnosis present

## 2017-02-13 MED ORDER — OMEPRAZOLE 20 MG PO CPDR: 20.0000 mg | DELAYED_RELEASE_CAPSULE | Freq: Every day | ORAL | 0 refills | Status: DC

## 2017-02-13 NOTE — Progress Notes (Signed)
   [facility] Phone: [phone]  Subjective:  [name] is a 61 year old male presenting to clinic with sore throat for the last month. The sore throat is getting worse. He has associated cough and hoarseness. The cough is dry and occurs all throughout the day. The cough is not worse at night. Nothing makes the sore throat worse. The sore throat gets better with cough drops. He denies any heartburn. No oral lesions. No shortness of breath. He does not eat any spicy foods. He does drink a cup of coffee daily. No congestion or runny nose.  ROS: See HPI for pertinent positives and negatives  Past Medical History- HTN, hepatitis C, bipolar disorder, anxiety, chronic low back pain, chronic epigastric abdominal pain, HLD  Family history reviewed for today's visit. No changes.  Social history- patient is a former smoker, quit 20 years ago.  Objective: BP 112/72   Pulse 79   Temp 97.7 F (36.5 C) (Oral)   Wt 162 lb (73.5 kg)   SpO2 95%   BMI 25.37 kg/m  Gen: NAD, alert, cooperative with exam HEENT: NCAT, EOMI, nasal turbinates normal in appearance, oropharynx and oral cavity normal in appearance, no lesions present, tonsils absent Neck: FROM, supple, no neck masses, no thyromegaly, no thyroid nodules.  Assessment/Plan: Sore Throat: Has been going on for 1 month. Think this is likely reflux given that it is associated with cough and hoarseness. Allergies also on the differential, although patient denies any congestion or rhinorrhea, and nasal turbinates are normal on exam. Patient does have a remote history of tobacco use, but no lesions or masses to suggest a head/neck cancer. - Treat empirically with Omeprazole 20mg  daily x 3 months - Follow-up if no improvement   [name], MD PGY-3

## 2017-02-13 NOTE — Assessment & Plan Note (Signed)
Has been going on for 1 month. Think this is likely reflux given that it is associated with cough and hoarseness. Allergies also on the differential, although patient denies any congestion or rhinorrhea, and nasal turbinates are normal on exam. Patient does have a remote history of tobacco use, but no lesions or masses to suggest a head/neck cancer. - Treat empirically with Omeprazole 20mg  daily x 3 months - Follow-up if no improvement

## 2017-02-13 NOTE — Patient Instructions (Signed)
It was wonderful to see you today!  I think you have reflux. I have started you back on Omeprazole. Please take 1 tablet daily for 3 months.  Please let me know if this isn't helping!  -Dr. Brett Albino

## 2017-02-20 ENCOUNTER — Telehealth (HOSPITAL_COMMUNITY): Payer: Self-pay | Admitting: *Deleted

## 2017-02-20 NOTE — Telephone Encounter (Signed)
Patient given detailed instructions per Myocardial Perfusion Study Information Sheet for the test on 02/26/17. Patient notified to arrive 15 minutes early and that it is imperative to arrive on time for appointment to keep from having the test rescheduled.  If you need to cancel or reschedule your appointment, please call the office within 24 hours of your appointment. . Patient verbalized understanding. [name]    

## 2017-02-26 ENCOUNTER — Ambulatory Visit (HOSPITAL_BASED_OUTPATIENT_CLINIC_OR_DEPARTMENT_OTHER): Payer: Medicare Other

## 2017-02-26 ENCOUNTER — Other Ambulatory Visit: Payer: Self-pay

## 2017-02-26 ENCOUNTER — Ambulatory Visit (INDEPENDENT_AMBULATORY_CARE_PROVIDER_SITE_OTHER): Payer: Medicare Other

## 2017-02-26 ENCOUNTER — Ambulatory Visit (HOSPITAL_COMMUNITY): Payer: Medicare Other | Attending: Internal Medicine

## 2017-02-26 ENCOUNTER — Telehealth: Payer: Self-pay | Admitting: *Deleted

## 2017-02-26 DIAGNOSIS — R0789 Other chest pain: Secondary | ICD-10-CM | POA: Insufficient documentation

## 2017-02-26 DIAGNOSIS — R9439 Abnormal result of other cardiovascular function study: Secondary | ICD-10-CM | POA: Insufficient documentation

## 2017-02-26 DIAGNOSIS — R42 Dizziness and giddiness: Secondary | ICD-10-CM | POA: Insufficient documentation

## 2017-02-26 DIAGNOSIS — Z87891 Personal history of nicotine dependence: Secondary | ICD-10-CM | POA: Insufficient documentation

## 2017-02-26 DIAGNOSIS — R0602 Shortness of breath: Secondary | ICD-10-CM | POA: Diagnosis not present

## 2017-02-26 DIAGNOSIS — I119 Hypertensive heart disease without heart failure: Secondary | ICD-10-CM | POA: Diagnosis not present

## 2017-02-26 LAB — MYOCARDIAL PERFUSION IMAGING
Estimated workload: 9.7 METS
Exercise duration (min): 8 min
Exercise duration (sec): 16 s
LV dias vol: 64 mL (ref 62–150)
LV sys vol: 24 mL
MPHR: 161 {beats}/min
Peak HR: 146 {beats}/min
Percent HR: 91 %
RATE: 0.35
RPE: 18
Rest HR: 68 {beats}/min
SDS: 7
SRS: 7
SSS: 12
TID: 0.86

## 2017-02-26 MED ORDER — TECHNETIUM TC 99M TETROFOSMIN IV KIT
32.4000 | PACK | Freq: Once | INTRAVENOUS | Status: AC | PRN
  Administered 2017-02-26: 32.4 via INTRAVENOUS
  Filled 2017-02-26: qty 33

## 2017-02-26 MED ORDER — TECHNETIUM TC 99M TETROFOSMIN IV KIT
10.6000 | PACK | Freq: Once | INTRAVENOUS | Status: AC | PRN
  Administered 2017-02-26: 10.6 via INTRAVENOUS
  Filled 2017-02-26: qty 11

## 2017-02-26 NOTE — Telephone Encounter (Signed)
-----   Message from Burtis Junes, NP sent at 02/26/2017 11:29 AM EST ----- Reviewed for Richardson Dopp, PA Ok to report. Echo shows normal pumping function but he now has more thickness of his heart - BP probably not ideally controlled.  Will see how his other studies turn out. Keep BP diary as already instructed See Dr. Martinique as planned.

## 2017-02-26 NOTE — Telephone Encounter (Signed)
Pt has been notified of echo results and recommendations. Pt advised to watch salt in his diet in order to help kepp BP is better control. Pt advised to continue to monitor BP and keep appt with Dr. Martinique 04/2017. Advised pt I will let him know once we have the Myoview results. Pt thanked me for my call. Pt asked me to please let the office know how thankful he is for all we have done to help care for him. I told pt that the office appreciates his kind words.

## 2017-03-03 ENCOUNTER — Telehealth: Payer: Self-pay | Admitting: *Deleted

## 2017-03-03 NOTE — Telephone Encounter (Signed)
Pt has been notified of Myoview results by phone with verbal understanding. Pt thanked me for my call.

## 2017-03-03 NOTE — Telephone Encounter (Signed)
-----   Message from Burtis Junes, NP sent at 02/28/2017 12:17 PM EST ----- Reviewed for Richardson Dopp, PA Reviewed by Dr. Martinique Myoview looks low risk = looks unchanged from study in 2014.  Dr. Martinique has advised continued medical therapy.  Needs to keep his follow up as planned in January. Will see how the monitor turns out as well.

## 2017-03-03 NOTE — Telephone Encounter (Signed)
Left message to go over Myoview results.  

## 2017-03-03 NOTE — Telephone Encounter (Signed)
Pt had called back though I was unavailable. I returned call to pt that I will call him back later this afternoon when clinic has finished.

## 2017-03-03 NOTE — Telephone Encounter (Signed)
error 

## 2017-03-03 NOTE — Telephone Encounter (Signed)
F/u call.. Patient returning call for results

## 2017-03-05 ENCOUNTER — Encounter: Payer: Self-pay | Admitting: Internal Medicine

## 2017-03-05 ENCOUNTER — Ambulatory Visit (INDEPENDENT_AMBULATORY_CARE_PROVIDER_SITE_OTHER): Payer: Medicare Other | Admitting: Internal Medicine

## 2017-03-05 ENCOUNTER — Other Ambulatory Visit: Payer: Self-pay

## 2017-03-05 VITALS — BP 108/70 | HR 67 | Temp 97.6°F | Wt 165.0 lb

## 2017-03-05 DIAGNOSIS — J029 Acute pharyngitis, unspecified: Secondary | ICD-10-CM

## 2017-03-05 MED ORDER — FLUTICASONE PROPIONATE 50 MCG/ACT NA SUSP: 2.0000 | Freq: Every day | NASAL | 1 refills | Status: DC

## 2017-03-05 NOTE — Progress Notes (Signed)
   [facility] Phone: [phone]  Subjective:  [name] is a 61 year old male presenting to clinic with persistent sore throat for the last 2 months. He was seen in clinic on 02/13/17. He was prescribed a trial of Prilosec, but it has not been helping. His sore throat has continued to get worse. He also notes that he is having hoarseness that is getting worse. He denies any dysphagia, weight loss, night sweats. He smoked in the past, but quit 20 years ago. He denies rhinorrhea or nasal congestion.   ROS: See HPI for pertinent positives and negatives  Past Medical History- HTN, OSA on CPAP, hep C, HLD, bipolar disorder, depression, anxiety.  Family history reviewed for today's visit. No changes.  Social history- patient is a former smoker, quit 20 years ago.  Objective: BP 108/70   Pulse 67   Temp 97.6 F (36.4 C) (Oral)   Wt 165 lb (74.8 kg)   SpO2 96%   BMI 25.84 kg/m  Gen: NAD, alert, cooperative with exam HEENT: NCAT, EOMI, MMM, oropharynx is mildly erythematous, no tonsillar exudate, no masses visualized Neck: FROM, supple, no masses palpated in the throat or neck, no thyromegaly or thyroid nodules appreciated.  Assessment/Plan: Sore Throat: Has persisted for the last 2 months and is worsening. Associated with hoarseness. No improvement with empiric trial of PPI. Allergies also on the differential, but patient denies rhinorrhea, congestion, or postnasal drip. No dysphagia or choking sensation to suggest esophageal etiology. Has a history of tobacco use, but quit smoking 20 years ago. - Will try Flonase 2 sprays daily to see if this helps - Referral placed to ENT for further evaluation   [name], MD PGY-3

## 2017-03-05 NOTE — Patient Instructions (Signed)
I'm so happy to hear you had a great Thanksgiving!  I prescribed Flonase. Please use two sprays daily. Make sure you turn the spray to the outside of the nose.  I have sent in a referral to the Ear, Nose, and Throat doctor. You should hear from our office in 2 weeks to schedule this appointment.  -Dr. Brett Albino

## 2017-03-05 NOTE — Assessment & Plan Note (Signed)
Has persisted for the last 2 months and is worsening. Associated with hoarseness. No improvement with empiric trial of PPI. Allergies also on the differential, but patient denies rhinorrhea, congestion, or postnasal drip. No dysphagia or choking sensation to suggest esophageal etiology. Has a history of tobacco use, but quit smoking 20 years ago. - Will try Flonase 2 sprays daily to see if this helps - Referral placed to ENT for further evaluation

## 2017-04-29 NOTE — Progress Notes (Deleted)
Cardiology Office Note:    Date:  04/29/2017   ID:  [name], DOB [birth year], MRN [id number]  PCP:  Sela Hua, MD  Cardiologist:  Dr. Gatlyn Lipari [name]    Referring MD: Sela Hua, MD   No chief complaint on file.   History of Present Illness:    [name] is a 62 y.o. male with a hx of bipolar d/o, anxiety, prior polysubstance abuse, obstructive sleep apnea, pancreatitis, chronic back pain, HCV.  Cardiac Catheterization in 2003 demonstrated normal coronary arteries.    [name] was seen  for evaluation of dizziness in November.    His symptoms of dizziness are somewhat difficult to describe.  He has noted it for probably the last year.  It has been worse over the last few months.  He has been somewhat sedentary due to chronic back pain.  He recently started to go to the gym and ride a stationary bike.  After riding a bike for 10 minutes, he will get lightheaded.  He thought it was because he has been out of shape.  He has also noted lightheadedness with symptoms of anxiety.  He feels that he can bring on dizziness if he does any activity long enough.  He also describes spinning sensation when lying flat.  He has not really noted orthostatic intolerance.  He denies frank syncope or falling.  He does get chest pain at times.  This seems to occur with activities.  He has noted it more in the last 6 months.  He also has chest discomfort associated with anxiety.  He has a long history of shortness of breath.  He feels that this is worse in the last 2-3 months.  He denies orthopnea, PND or edema.  Prior CV studies:   The following studies were reviewed today:  Echo 11/04/12 Mild LVH, EF 55-65, no RWMA, Gr 2 DD, mild LAE  Nuclear stress test 09/16/12 IMPRESSION: Normal pharmacologic stress nuclear scan.  No scar or ischemia. Normal wall motion.  This is a low risk scan.  Cardiac Catheterization 01/2002 EF 50-55 Normal coronary arteries    Past Medical History:  Diagnosis  Date  . Alcohol abuse   . Allergy   . Bipolar disorder ([place])   . Chronic pain    lower back pain, "that has progressed"  . Depression   . Diverticulosis   . Dyspnea   . Hepatitis C    hx. of"states he no longer has"" is clear".  . History of recreational drug use    "cocaine, marijuana, polysubstance" quit 1987.  Marland Kitchen HLD (hyperlipidemia)   . Hypertension   . Pancreatitis 2015  . Panic attack   . Sleep apnea    no cpap used,still has machine(use rarely)    Past Surgical History:  Procedure Laterality Date  . CARDIAC CATHETERIZATION  2003   No CAD  . EUS N/A 06/23/2014   Procedure: UPPER ENDOSCOPIC ULTRASOUND (EUS) LINEAR;  Surgeon: Milus Banister, MD;  Location: [facility];  Service: Endoscopy;  Laterality: N/A;  . sinus surgery    . TONSILLECTOMY      Current Medications: No outpatient medications have been marked as taking for the 05/02/17 encounter (Appointment) with [name], Samuel Searles M, MD.     Allergies:   Morphine and related   Social History   Tobacco Use  . Smoking status: Former Smoker    Packs/day: 2.00    Years: 16.00    Pack years: 32.00    Types: Cigarettes  Last attempt to quit: 04/08/2002    Years since quitting: 15.0  . Smokeless tobacco: Never Used  Substance Use Topics  . Alcohol use: No    Alcohol/week: 0.0 oz    Comment: Quit in 1987  . Drug use: No    Comment: Use to take cocaine, marijuana, per patient everything. Stopped in 1987.     Family Hx: The patient's family history includes Breast cancer in his maternal grandmother; COPD in his mother; Diabetes in his maternal grandfather; Heart disease in his father and mother; Kidney disease in his mother; Lupus in his mother.  ROS:   Please see the history of present illness.    Review of Systems  Constitution: Positive for chills and malaise/fatigue.  Eyes: Positive for visual disturbance.  Cardiovascular: Positive for chest pain and dyspnea on exertion.  Respiratory: Positive for snoring.    Musculoskeletal: Positive for back pain, joint pain and myalgias.  Gastrointestinal: Positive for nausea and vomiting.  Neurological: Positive for dizziness, headaches and loss of balance.  Psychiatric/Behavioral: Positive for depression. The patient is nervous/anxious.    All other systems reviewed and are negative.   EKGs/Labs/Other Test Reviewed:    EKG:  EKG is  ordered today.  The ekg ordered today demonstrates normal sinus rhythm, heart rate 75, left axis deviation, QTC 444 ms, no significant change since prior tracing  Recent Labs: 07/26/2016: BUN 10; Creatinine, Ser 0.86; Hemoglobin 13.6; Platelets 247; Potassium 4.8; Sodium 143   Recent Lipid Panel Lab Results  Component Value Date/Time   CHOL 117 07/26/2016 11:07 AM   TRIG 46 07/26/2016 11:07 AM   HDL 56 07/26/2016 11:07 AM   CHOLHDL 2.1 07/26/2016 11:07 AM   CHOLHDL 3.5 09/16/2012 05:13 AM   LDLCALC 52 07/26/2016 11:07 AM   LDLDIRECT 33 08/25/2014 02:16 PM   Echo 02/26/17: Study Conclusions  - Left ventricle: The cavity size was normal. Wall thickness was   increased in a pattern of moderate LVH. Systolic function was   normal. The estimated ejection fraction was in the range of 60%   to 65%. Wall motion was normal; there were no regional wall   motion abnormalities. Doppler parameters are consistent with   abnormal left ventricular relaxation (grade 1 diastolic   dysfunction). The E/e&' ratio is between 8-15, suggesting   indeterminate LV filling pressure. - Left atrium: The atrium was mildly dilated. - Inferior vena cava: The vessel was normal in size. The   respirophasic diameter changes were in the normal range (>= 50%),   consistent with normal central venous pressure.  Impressions:  - Compared to a prior study in 2014, there is now moderate LVH with   a higher LVEF of 60-65%, the LA is mildly dilated with grade 1 DD   and indeterminate LV filling pressure.  Myoview 02/26/17: Study Highlights      Nuclear stress EF: 63%.  Blood pressure demonstrated a normal response to exercise.  There was no ST segment deviation noted during stress.  Findings consistent with prior myocardial infarction with peri-infarct ischemia.  This is a low risk study.  The left ventricular ejection fraction is normal (55-65%).   Normal exercise treadmill response However perfusion images suggest small apical infarct with moderate peri infarct Ischemia involving the distal anterior wall apex and septum SDS 7 EF normal 63%   Event monitor 02/26/17: Study Highlights    Normal sinus rhythm  No arrhythmia seen  No correlation with symptoms of chest pain, tightness, dizziness, or lightheadedness  Physical Exam:    VS:  There were no vitals taken for this visit.   No data found.   Wt Readings from Last 3 Encounters:  03/05/17 165 lb (74.8 kg)  02/13/17 162 lb (73.5 kg)  02/12/17 166 lb 6.4 oz (75.5 kg)   GENERAL:  Well appearing HEENT:  PERRL, EOMI, sclera are clear. Oropharynx is clear. NECK:  No jugular venous distention, carotid upstroke brisk and symmetric, no bruits, no thyromegaly or adenopathy LUNGS:  Clear to auscultation bilaterally CHEST:  Unremarkable HEART:  RRR,  PMI not displaced or sustained,S1 and S2 within normal limits, no S3, no S4: no clicks, no rubs, no murmurs ABD:  Soft, nontender. BS +, no masses or bruits. No hepatomegaly, no splenomegaly EXT:  2 + pulses throughout, no edema, no cyanosis no clubbing SKIN:  Warm and dry.  No rashes NEURO:  Alert and oriented x 3. Cranial nerves II through XII intact. PSYCH:  Cognitively intact    ASSESSMENT:    No diagnosis found. PLAN:    In order of problems listed above:  1.  Dizziness Etiology of his symptoms are not entirely clear.  He denies frank syncope.  It seems to come on with activity.  He has also had symptoms of vertigo.  He does take several medications that can cause dizziness.  I have asked him to  follow-up with his pain specialist and psychiatrist to discuss these further.  Since he does have symptoms with activity, we will need to rule out a cardiac ED.  He has noted high blood pressures at times when he feels dizzy.  -Otain echocardiogram  -Obtain nuclear stress test  -Obtain 30-day event monitor  -Monitor blood pressure and bring recordings to next office visit  -Orthostatic vital signs today were unremarkable  -Review pain meds and bipolar meds with prescribing providers   2.  Other chest pain  Chest discomfort is somewhat atypical for ischemia.  He cannot walk on a treadmill due to his back pain.  I will obtain a Lexiscan Myoview to rule out ischemia.  3.  Shortness of breath He has a long history of shortness of breath.  It seems to be worse over the last several months.  He has noted dizziness with activity at times and also has chest discomfort.  I will obtain an echocardiogram to assess ejection fraction and rule out structural heart disease.  4.  HYPERTENSION, BENIGN - Diet Controlled Blood pressure is borderline today.  He has noted some elevated blood pressures at the times he has been dizzy.  However, this has occurred after activity.  I have asked him to monitor his blood pressures over the next several weeks and bring those readings to his next office visit.   Dispo:  No Follow-up on file.   Medication Adjustments/Labs and Tests Ordered: Current medicines are reviewed at length with the patient today.  Concerns regarding medicines are outlined above.  Tests Ordered: No orders of the defined types were placed in this encounter.  Medication Changes: No orders of the defined types were placed in this encounter.   Signed, Karalina Tift [place], MD  04/29/2017 7:18 AM    [address], [place], [place]  [address] Phone: [phone]; Fax: [phone]

## 2017-05-02 ENCOUNTER — Ambulatory Visit: Payer: Medicare Other | Admitting: Cardiology

## 2017-05-06 ENCOUNTER — Ambulatory Visit: Payer: Medicare Other | Admitting: Internal Medicine

## 2017-05-08 ENCOUNTER — Ambulatory Visit: Payer: Medicare Other | Admitting: Family Medicine

## 2017-05-16 ENCOUNTER — Encounter: Payer: Self-pay | Admitting: Gastroenterology

## 2017-05-16 ENCOUNTER — Ambulatory Visit (INDEPENDENT_AMBULATORY_CARE_PROVIDER_SITE_OTHER): Payer: Medicare Other | Admitting: Gastroenterology

## 2017-05-16 ENCOUNTER — Other Ambulatory Visit: Payer: Medicare Other

## 2017-05-16 VITALS — BP 112/80 | HR 60 | Ht 66.0 in | Wt 163.8 lb

## 2017-05-16 DIAGNOSIS — R1084 Generalized abdominal pain: Secondary | ICD-10-CM | POA: Diagnosis not present

## 2017-05-16 DIAGNOSIS — R1013 Epigastric pain: Secondary | ICD-10-CM

## 2017-05-16 NOTE — Progress Notes (Signed)
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  He has a long-standing history of chronic abdominal pain, is on chronic opioids and Xanax as well.  He has a burning abdominal discomfort that is migratory with no clear triggers or relieving factors.  He is also concerned about a chronic burning throat pain for which she is seeing ENT a few times, most recently 2 days ago.  He was previously treated for H. pylori according to that note, and eradication confirmed with a urea breath test.  However, he is concerned that the test may not been accurate since he was taking a PPI at that time.  He denies dysphagia or odynophagia. [name] also reports that his abdominal pain is worse under stress. He saw ENT 2 days ago, they have not discovered a cause for this burning throat pain and sent him to [place] when he requested a repeat GI evaluation.  ROS: Cardiovascular:  no chest pain Respiratory: no dyspnea Chronic pain syndrome Chronic anxiety The patient's Past Medical, Family and Social History were reviewed and are on file in the EMR.  Objective:  Med list reviewed  Current Outpatient Medications:  .  ALPRAZolam (XANAX) 0.5 MG tablet, Take 0.5 mg 3 (three) times daily by mouth., Disp: , Rfl: 4 .  cholecalciferol (VITAMIN D) 1000 UNITS tablet, Take 2,000 Units by mouth daily., Disp: , Rfl:  .  Cyanocobalamin (VITAMIN B-12 PO), Take 1 tablet by mouth daily., Disp: , Rfl:  .  fluticasone (FLONASE) 50 MCG/ACT nasal spray, Place 2 sprays into both nostrils daily., Disp: 16 g, Rfl: 1 .  gabapentin (NEURONTIN) 300 MG capsule, Take 300 mg 2 (two) times daily by mouth., Disp: , Rfl:  .  Glucosamine-Chondroitin (GLUCOSAMINE CHONDR COMPLEX PO), Take 1 tablet by mouth daily., Disp: , Rfl:  .  HYDROmorphone (DILAUDID) 4 MG tablet, Take 4 mg by mouth as needed. , Disp: ,  Rfl:  .  Multiple Vitamin (MULTIVITAMIN WITH MINERALS) TABS, Take 1 tablet by mouth daily. , Disp: , Rfl:  .  nitroGLYCERIN (NITROSTAT) 0.3 MG SL tablet, Place 1 tablet (0.3 mg total) under the tongue every 5 (five) minutes as needed for chest pain., Disp: 30 tablet, Rfl: 0 .  ondansetron (ZOFRAN) 4 MG tablet, Take 1 tablet (4 mg total) by mouth every 8 (eight) hours as needed for nausea or vomiting., Disp: 30 tablet, Rfl: 0 .  polyethylene glycol powder (GLYCOLAX/MIRALAX) powder, Take 17 g by mouth daily as needed (constipation)., Disp: 500 g, Rfl: 1 .  propranolol (INDERAL) 60 MG tablet, Take 60 mg by mouth 2 (two) times daily., Disp: , Rfl: 3 .  sucralfate (CARAFATE) 1 g tablet, Take 1 tablet (1 g total) by mouth 4 (four) times daily -  with meals and at bedtime., Disp: 90 tablet, Rfl: 0 .  TURMERIC PO, Take 1 tablet by mouth daily., Disp: , Rfl:  No current facility-administered medications for this visit.   Facility-Administered Medications Ordered in Other Visits:  .  0.9 %  sodium chloride infusion, , Intravenous, Continuous, [place], Amber M, MD, Last Rate: 20 mL/hr at 09/15/12 1635, 500 mL at 09/15/12 1635   Vital signs in last 24 hrs: Vitals:   05/16/17 1534  BP: 112/80  Pulse: 60    Physical Exam  This is a well-appearing man in no distress  HEENT:  sclera anicteric, oral mucosa moist without lesions  Neck: supple, no thyromegaly, JVD or lymphadenopathy  Cardiac: RRR without murmurs, S1S2 heard, no peripheral edema  Pulm: clear to auscultation bilaterally, normal RR and effort noted  Abdomen: soft, no tenderness, with active bowel sounds. No guarding or palpable hepatosplenomegaly.  Skin; warm and dry, no jaundice or rash    @ASSESSMENTPLANBEGIN @ Assessment: Encounter Diagnoses  Name Primary?  . Dyspepsia Yes  . Generalized abdominal pain    I think this patient has chronic functional abdominal pain related to chronic pain syndrome with exacerbation of  anxiety.  He said a previous thorough workup.  He is ruminative about the H. pylori.  I suspect it is of doubtful relation to his abdominal pain, but might put his mind at ease if we confirm that is eradicated.  He is not currently on PPI, but only sucralfate.  It has lately been difficult to find the lab but will do the breath test, so I send for stool antigen.  I have no further testing planned and abdominal best to reassure him.    Total time 20 minutes, over half spent in counseling and coordination of care.   Nelida Meuse III

## 2017-05-16 NOTE — Patient Instructions (Signed)
If you are age 60 or older, your body mass index should be between 23-30. Your Body mass index is 26.44 kg/m. If this is out of the aforementioned range listed, please consider follow up with your Primary Care Provider.  If you are age 29 or younger, your body mass index should be between 19-25. Your Body mass index is 26.44 kg/m. If this is out of the aformentioned range listed, please consider follow up with your Primary Care Provider.   Your physician has requested that you go to the basement for the following lab work before leaving today: H.Pylori stool  Thank you for choosing [facility]  Dr Wilfrid Lund III

## 2017-05-22 ENCOUNTER — Other Ambulatory Visit: Payer: Medicare Other

## 2017-05-22 DIAGNOSIS — R1013 Epigastric pain: Secondary | ICD-10-CM

## 2017-05-22 DIAGNOSIS — R1084 Generalized abdominal pain: Secondary | ICD-10-CM

## 2017-05-23 LAB — HELICOBACTER PYLORI  SPECIAL ANTIGEN
MICRO NUMBER:: 90199671
SPECIMEN QUALITY: ADEQUATE

## 2017-06-17 ENCOUNTER — Other Ambulatory Visit: Payer: Self-pay | Admitting: Internal Medicine

## 2017-07-08 ENCOUNTER — Encounter: Payer: Self-pay | Admitting: Internal Medicine

## 2017-07-08 ENCOUNTER — Ambulatory Visit (INDEPENDENT_AMBULATORY_CARE_PROVIDER_SITE_OTHER): Payer: Medicare Other | Admitting: Internal Medicine

## 2017-07-08 ENCOUNTER — Other Ambulatory Visit: Payer: Self-pay

## 2017-07-08 VITALS — BP 128/80 | HR 65 | Temp 97.5°F | Ht 67.0 in | Wt 166.0 lb

## 2017-07-08 DIAGNOSIS — Z0000 Encounter for general adult medical examination without abnormal findings: Secondary | ICD-10-CM

## 2017-07-08 DIAGNOSIS — R413 Other amnesia: Secondary | ICD-10-CM | POA: Diagnosis not present

## 2017-07-08 NOTE — Progress Notes (Signed)
62 y.o. year old male presents for well adult examination.  Acute Concerns: Memory concerns. Has been worried about this for the last couple of years. Thinks is memory has been getting worse over the last couple of months. Notes issues with short term memory. No concerns with long term memory. Would like to have testing done for this.  Diet: Eats oatmeal, cereal, fruits, sandwiches, vegetables, [place], fish, chicken. Doesn't eat a lot of sweets. Does not consume sugary beverages.  Exercise: Exercises 4-5 days per week. Goes to the gym, goes to the pool, does yoga and tai chi.  Social:  Social History   Socioeconomic History  . Marital status: Divorced    Spouse name: Not on file  . Number of children: 0  . Years of education: GED  . Highest education level: Not on file  Occupational History  . Occupation: DISABLED  . Occupation:    Social Needs  . Financial resource strain: Not on file  . Food insecurity:    Worry: Not on file    Inability: Not on file  . Transportation needs:    Medical: Not on file    Non-medical: Not on file  Tobacco Use  . Smoking status: Former Smoker    Packs/day: 2.00    Years: 16.00    Pack years: 32.00    Types: Cigarettes    Last attempt to quit: 04/08/2002    Years since quitting: 15.2  . Smokeless tobacco: Never Used  Substance and Sexual Activity  . Alcohol use: No    Alcohol/week: 0.0 oz    Comment: Quit in 1987  . Drug use: No    Comment: Use to take cocaine, marijuana, per patient everything. Stopped in 1987.  Marland Kitchen Sexual activity: Not Currently    Partners: Female  Lifestyle  . Physical activity:    Days per week: Not on file    Minutes per session: Not on file  . Stress: Not on file  Relationships  . Social connections:    Talks on phone: Not on file    Gets together: Not on file    Attends religious service: Not on file    Active member of club or organization: Not on file    Attends meetings of clubs or organizations: Not on file     Relationship status: Not on file  Other Topics Concern  . Not on file  Social History Narrative   Patient lives at home alone.    Disabled.   Education GED    Both handed.   Caffeine two cups of coffee daily.       Immunization: Immunization History  Administered Date(s) Administered  . H1N1 04/05/2008  . Influenza Split 06/10/2012  . Influenza Whole 02/02/2007, 04/05/2008, 02/27/2009, 12/18/2010  . Influenza,inj,Quad PF,6+ Mos 01/04/2014, 12/21/2014  . Influenza-Unspecified 02/06/2013, 01/06/2016, 01/06/2017  . Pneumococcal Polysaccharide-23 08/25/2014    Cancer Screening:  Colonoscopy: done 05/30/2014  Physical Exam: VITALS: Reviewed GEN: Alert, NAD HEENT: Normocephalic, PERRL, EOMI, no scleral icterus, MMM, uvula midline, no anterior or posterior lymphadenopathy, no thyromegaly CARDIAC:RRR, S1 and S2 present, no murmur, no heaves/thrills RESP: CTAB, normal effort ABD: soft, no tenderness, normal bowel sounds EXT: No edema, 2+ radial and DP pulses NEURO: Awake, alert, oriented, CN 2-12 grossly intact, no focal deficits SKIN: no rash  ASSESSMENT & PLAN: 62 y.o. male presents for annual well adult exam. Please see problem specific assessment and plan.   Memory Problem: Has been going on for the last few years,  but seems to be getting worse recently. Patient would like to be tested for this. - Referral placed for neuropsychological testing  Well Adult Exam: - Will obtain CBC, BMP, lipid panel, PSA for annual screening labs - Prescriptions sent to pharmacy for tdap and shingles vaccines

## 2017-07-08 NOTE — Patient Instructions (Signed)
It was so nice to see you today!  We have ordered some labs for you and I will call you with these results.   I have also sent in a referral for you to have neuropsychological testing done for your memory. You should hear from our office to schedule this appointment.  -Dr. Brett Albino

## 2017-07-09 ENCOUNTER — Telehealth: Payer: Self-pay | Admitting: Internal Medicine

## 2017-07-09 LAB — CBC
Hematocrit: 39.8 % (ref 37.5–51.0)
Hemoglobin: 13.6 g/dL (ref 13.0–17.7)
MCH: 26.3 pg — ABNORMAL LOW (ref 26.6–33.0)
MCHC: 34.2 g/dL (ref 31.5–35.7)
MCV: 77 fL — ABNORMAL LOW (ref 79–97)
Platelets: 238 10*3/uL (ref 150–379)
RBC: 5.18 x10E6/uL (ref 4.14–5.80)
RDW: 15.2 % (ref 12.3–15.4)
WBC: 5.9 10*3/uL (ref 3.4–10.8)

## 2017-07-09 LAB — COMPREHENSIVE METABOLIC PANEL
ALT: 19 IU/L (ref 0–44)
AST: 18 IU/L (ref 0–40)
Albumin/Globulin Ratio: 1.7 (ref 1.2–2.2)
Albumin: 4.7 g/dL (ref 3.6–4.8)
Alkaline Phosphatase: 60 IU/L (ref 39–117)
BUN/Creatinine Ratio: 13 (ref 10–24)
BUN: 14 mg/dL (ref 8–27)
Bilirubin Total: 0.4 mg/dL (ref 0.0–1.2)
CO2: 23 mmol/L (ref 20–29)
Calcium: 9.9 mg/dL (ref 8.6–10.2)
Chloride: 103 mmol/L (ref 96–106)
Creatinine, Ser: 1.07 mg/dL (ref 0.76–1.27)
GFR calc Af Amer: 87 mL/min/{1.73_m2} (ref >59)
GFR calc non Af Amer: 75 mL/min/{1.73_m2} (ref >59)
Globulin, Total: 2.7 g/dL (ref 1.5–4.5)
Glucose: 117 mg/dL — ABNORMAL HIGH (ref 65–99)
Potassium: 4.8 mmol/L (ref 3.5–5.2)
Sodium: 143 mmol/L (ref 134–144)
Total Protein: 7.4 g/dL (ref 6.0–8.5)

## 2017-07-09 LAB — LIPID PANEL
Chol/HDL Ratio: 3.5 ratio (ref 0.0–5.0)
Cholesterol, Total: 168 mg/dL (ref 100–199)
HDL: 48 mg/dL (ref >39)
LDL Calculated: 96 mg/dL (ref 0–99)
Triglycerides: 119 mg/dL (ref 0–149)
VLDL Cholesterol Cal: 24 mg/dL (ref 5–40)

## 2017-07-09 LAB — PSA: Prostate Specific Ag, Serum: 0.3 ng/mL (ref 0.0–4.0)

## 2017-07-09 NOTE — Telephone Encounter (Signed)
Called patient to let him know that his lab results looked good. MCV was a little low, but hemoglobin is normal. Could consider checking ferritin if he becomes anemic in the future. Patient voiced understanding and all questions were answered.  [name], MD PGY-3

## 2017-07-10 DIAGNOSIS — Z0000 Encounter for general adult medical examination without abnormal findings: Secondary | ICD-10-CM | POA: Insufficient documentation

## 2017-07-10 NOTE — Assessment & Plan Note (Signed)
-   Will obtain CBC, BMP, lipid panel, PSA for annual screening labs - Prescriptions sent to pharmacy for tdap and shingles vaccines

## 2017-07-10 NOTE — Assessment & Plan Note (Signed)
Has been going on for the last few years, but seems to be getting worse recently. Patient would like to be tested for this. - Referral placed for neuropsychological testing

## 2017-07-15 ENCOUNTER — Encounter: Payer: Self-pay | Admitting: Neurology

## 2017-07-28 ENCOUNTER — Ambulatory Visit (HOSPITAL_COMMUNITY)
Admission: EM | Admit: 2017-07-28 | Discharge: 2017-07-28 | Disposition: A | Discharge status: Home / Self Care | Payer: Medicare Other | Attending: Family Medicine | Admitting: Family Medicine

## 2017-07-28 ENCOUNTER — Encounter (HOSPITAL_COMMUNITY): Payer: Self-pay | Admitting: Family Medicine

## 2017-07-28 DIAGNOSIS — R0602 Shortness of breath: Secondary | ICD-10-CM | POA: Diagnosis not present

## 2017-07-28 DIAGNOSIS — J029 Acute pharyngitis, unspecified: Secondary | ICD-10-CM | POA: Diagnosis not present

## 2017-07-28 DIAGNOSIS — R5383 Other fatigue: Secondary | ICD-10-CM

## 2017-07-28 NOTE — Discharge Instructions (Addendum)
Use OTC antihistamine, and continue salt-water gargles for symptomatic relief  Recommended further evaluation and treatment at ER for shortness of breath, and fatigue.  Patient declines at this time.  Will go to ER if symptoms return or worsen such as shortness of breath at rest, chest pain, fever, chills, profuse sweating, nausea, vomiting, and/or changes in bowel or bladder habits Referral to cardiology for further evaluation and treatment of shortness of breath and fatigue ASAP

## 2017-07-28 NOTE — ED Provider Notes (Signed)
[facility]    CSN: [id number] Arrival date & time: 07/28/17  1746     History   Chief Complaint Chief Complaint  Patient presents with  . Sore Throat  . Shortness of Breath    HPI [name] is a 62 y.o. male.   Patient complains of sore throat for the past 3-4 days.  He denies a precipitating event or positive sick contact.  He describes his pain as constant and sharp in character.  He has tried salt water gargles with temporary improvement.  His symptoms are made worse with swallowing foods.  He admits to similar symptoms in the past that resolved with rest.  Patient also reports SOB and fatigue for the past 3 days.  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   He describes his symptoms as intermittent and lasting 30-45 minutes and states it is "difficult to get air."  His symptoms are made worse with exertion.  His symptoms are improved with rest.  He reports having a previous EKG, and having to wear a monitor, but did not follow up with appointment.       Past Medical History:  Diagnosis Date  . Alcohol abuse   . Allergy   . Bipolar disorder ([place])   . Chronic pain    lower back pain, "that has progressed"  . Depression   . Diverticulosis   . Dyspnea   . Hepatitis C    hx. of"states he no longer has"" is clear".  . History of recreational drug use    "cocaine, marijuana, polysubstance" quit 1987.  Marland Kitchen HLD (hyperlipidemia)   . Hypertension   . Pancreatitis 2015  . Panic attack   . Sleep apnea    no cpap used,still has machine(use rarely)    Patient Active Problem List   Diagnosis Date Noted  . Well adult exam 07/10/2017  . Abdominal pain, chronic, epigastric 11/30/2014  . HLD (hyperlipidemia) 08/25/2014  . Dizziness 09/28/2013  . Memory problem 12/25/2011  . Sore throat 12/25/2011  . OSA on CPAP 09/20/2009  . HYPERTENSION, BENIGN - Diet Controlled 10/19/2008  . RHINITIS 05/13/2007  . HEPATITIS C 06/05/2006  .  DEPRESSION, MAJOR, RECURRENT 06/05/2006  . BIPOLAR DISORDER 06/05/2006  . ANXIETY 06/05/2006  . Hx of Alcohol abuse 06/05/2006  . BACK PAIN, LOW 06/05/2006    Past Surgical History:  Procedure Laterality Date  . CARDIAC CATHETERIZATION  2003   No CAD  . EUS N/A 06/23/2014   Procedure: UPPER ENDOSCOPIC ULTRASOUND (EUS) LINEAR;  Surgeon: Milus Banister, MD;  Location: [facility];  Service: Endoscopy;  Laterality: N/A;  . sinus surgery    . TONSILLECTOMY         Home Medications    Prior to Admission medications   Medication Sig Start Date End Date Taking? Authorizing Provider  ALPRAZolam Duanne Moron) 0.5 MG tablet Take 0.5 mg 3 (three) times daily by mouth. 01/26/17   [provider]  cholecalciferol (VITAMIN D) 1000 UNITS tablet Take 2,000 Units by mouth daily.    [provider]  Cyanocobalamin (VITAMIN B-12 PO) Take 1 tablet by mouth daily.    [provider]  fluticasone (FLONASE) 50 MCG/ACT nasal spray SPRAY 2 SPRAYS INTO EACH NOSTRIL EVERY DAY 06/17/17   Mayo, Pete Pelt, MD  Glucosamine-Chondroitin (GLUCOSAMINE CHONDR COMPLEX PO) Take 1 tablet by mouth daily.    [provider]  HYDROmorphone (DILAUDID) 4 MG tablet Take 4 mg by mouth as needed.  07/30/16   [provider]  Multiple Vitamin (MULTIVITAMIN WITH MINERALS) TABS Take 1 tablet by mouth daily.     [provider]  nitroGLYCERIN (NITROSTAT) 0.3 MG SL tablet Place 1 tablet (0.3 mg total) under the tongue every 5 (five) minutes as needed for chest pain. 07/26/16   Mikell, Jeani Sow, MD  ondansetron (ZOFRAN) 4 MG tablet Take 1 tablet (4 mg total) by mouth every 8 (eight) hours as needed for nausea or vomiting. 11/05/16   Mayo, Pete Pelt, MD  propranolol (INDERAL) 60 MG tablet Take 60 mg by mouth 2 (two) times daily. 05/09/17   [provider]  TURMERIC PO Take 1 tablet by mouth daily.    [provider]    Family History Family History  Problem Relation  Age of Onset  . Heart disease Mother   . Kidney disease Mother   . COPD Mother   . Lupus Mother   . Heart disease Father   . Breast cancer Maternal Grandmother   . Diabetes Maternal Grandfather     Social History Social History   Tobacco Use  . Smoking status: Former Smoker    Packs/day: 2.00    Years: 16.00    Pack years: 32.00    Types: Cigarettes    Last attempt to quit: 04/08/2002    Years since quitting: 15.3  . Smokeless tobacco: Never Used  Substance Use Topics  . Alcohol use: No    Alcohol/week: 0.0 oz    Comment: Quit in 1987  . Drug use: No    Comment: Use to take cocaine, marijuana, per patient everything. Stopped in 1987.     Allergies   Morphine and related   Review of Systems Review of Systems  Constitutional: Positive for appetite change, chills and fatigue. Negative for fever.  HENT: Positive for sneezing and sore throat. Negative for congestion, ear discharge, ear pain, rhinorrhea, sinus pressure and sinus pain.   Respiratory: Positive for cough (dry).   Cardiovascular: Negative for chest pain.  Gastrointestinal: Negative for abdominal pain, constipation, diarrhea, nausea and vomiting.  Genitourinary: Negative for difficulty urinating and dysuria.  Musculoskeletal: Positive for back pain (chronic in nature).     Physical Exam Triage Vital Signs ED Triage Vitals  Enc Vitals Group     BP 07/28/17 1842 106/68     Pulse Rate 07/28/17 1842 72     Resp 07/28/17 1842 18     Temp 07/28/17 1842 98.1 F (36.7 C)     Temp src --      SpO2 07/28/17 1842 96 %     Weight --      Height --      Head Circumference --      Peak Flow --      Pain Score 07/28/17 1841 4     Pain Loc --      Pain Edu? --      Excl. in [place]? --    No data found.  Updated Vital Signs BP 106/68   Pulse 72   Temp 98.1 F (36.7 C)   Resp 18   SpO2 96%   Physical Exam  Constitutional: He is oriented to person, place, and time. He appears well-developed and  well-nourished.  Non-toxic appearance. He does not appear ill. No distress.  HENT:  Head: Normocephalic and atraumatic.  Right Ear: Hearing, tympanic membrane and ear canal normal.  Left Ear: Hearing, tympanic membrane and ear canal normal.  Mouth/Throat: Uvula is midline, oropharynx  is clear and moist and mucous membranes are normal. No uvula swelling. No posterior oropharyngeal erythema. Tonsils are 0 on the right. Tonsils are 0 on the left.  Tonsils absent  Eyes: Pupils are equal, round, and reactive to light. EOM are normal.  Neck: Neck supple.  Cardiovascular: Normal rate, regular rhythm and normal heart sounds.  No murmur heard. Radial pulse 2+ bilaterally  Pulmonary/Chest: Effort normal and breath sounds normal. No accessory muscle usage. No respiratory distress. He has no wheezes. He has no rhonchi. He has no rales.  Abdominal: Soft. Bowel sounds are normal. There is no tenderness. There is no guarding.  Lymphadenopathy:    He has no cervical adenopathy.  Neurological: He is alert and oriented to person, place, and time.  Skin: Skin is warm and dry. Capillary refill takes less than 2 seconds.  Psychiatric: He has a normal mood and affect. His behavior is normal.     UC Treatments / Results  Labs (all labs ordered are listed, but only abnormal results are displayed) Labs Reviewed - No data to display  EKG None Radiology No results found.  Procedures Procedures (including critical care time)  Medications Ordered in UC Medications - No data to display   Initial Impression / Assessment and Plan / UC Course  I have reviewed the triage vital signs and the nursing notes.  Pertinent labs & imaging results that were available during my care of the patient were reviewed by me and considered in my medical decision making (see chart for details).     Patient presents with 3-4 days hx of sore throat.  Offered strep testing declined.  Based on symptoms and physical exam  findings, illness is mostly likely due to allergies or viral infection.  Instructed to continue salt-water gargles and OTC antihistamine for symptomatic relief  Patient also complains of SOB and fatigue that is made worse with exertion for the past three days.  Currently stable and asymptomatic.  He had a previous EKG on 02/12/17 that showed normal sinus rhythm with LAD.  EKG ordered in office and showed regular rate and rhythm without LAD.  Normal PR interval without dropped p waves or QRS complexes.  No ST depression/elevations or T-wave inversions appreciated.  Referral to cardiology for further evaluation and treatment. I recommended further evaluation and treatment at ER to rule out possible missed MI.  Patient declines at this time. Patient advised to go to ER if symptoms recur or worsening symptoms occur such as SOB at rest, chest pain, nausea, vomiting, or profuse sweating.  I also discussed with patient given his age, he may not have these symptoms associated with MI or other pathological abnormalities.  Patient is aware and still declines further evaluation and treatment in ER. He is in agreement with this plan.    Final Clinical Impressions(s) / UC Diagnoses   Final diagnoses:  Acute pharyngitis, unspecified etiology  Shortness of breath  Fatigue, unspecified type    ED Discharge Orders    None       Controlled Substance Prescriptions  Controlled Substance Registry consulted? Not Applicable   Lestine Box, [place] 07/28/17 2018

## 2017-07-28 NOTE — ED Triage Notes (Signed)
Pt here for sore throat, cough, SOB and fatigue the past few days. sts also cramping in back and chest

## 2017-10-03 ENCOUNTER — Ambulatory Visit: Payer: Medicare Other | Admitting: Internal Medicine

## 2017-11-27 ENCOUNTER — Encounter (HOSPITAL_COMMUNITY): Payer: Self-pay | Admitting: Emergency Medicine

## 2017-11-27 ENCOUNTER — Ambulatory Visit (HOSPITAL_COMMUNITY)
Admission: EM | Admit: 2017-11-27 | Discharge: 2017-11-27 | Disposition: A | Discharge status: Home / Self Care | Payer: Medicare Other | Attending: Family Medicine | Admitting: Family Medicine

## 2017-11-27 DIAGNOSIS — Z885 Allergy status to narcotic agent status: Secondary | ICD-10-CM | POA: Diagnosis not present

## 2017-11-27 DIAGNOSIS — Z79899 Other long term (current) drug therapy: Secondary | ICD-10-CM | POA: Insufficient documentation

## 2017-11-27 DIAGNOSIS — E785 Hyperlipidemia, unspecified: Secondary | ICD-10-CM | POA: Diagnosis not present

## 2017-11-27 DIAGNOSIS — J029 Acute pharyngitis, unspecified: Secondary | ICD-10-CM | POA: Insufficient documentation

## 2017-11-27 DIAGNOSIS — Z87891 Personal history of nicotine dependence: Secondary | ICD-10-CM | POA: Diagnosis not present

## 2017-11-27 DIAGNOSIS — G4733 Obstructive sleep apnea (adult) (pediatric): Secondary | ICD-10-CM | POA: Insufficient documentation

## 2017-11-27 DIAGNOSIS — I10 Essential (primary) hypertension: Secondary | ICD-10-CM | POA: Insufficient documentation

## 2017-11-27 DIAGNOSIS — F419 Anxiety disorder, unspecified: Secondary | ICD-10-CM | POA: Diagnosis not present

## 2017-11-27 LAB — POCT RAPID STREP A: Streptococcus, Group A Screen (Direct): NEGATIVE

## 2017-11-27 NOTE — ED Provider Notes (Signed)
[name]    CSN: [id number] Arrival date & time: 11/27/17  1814     History   Chief Complaint Chief Complaint  Patient presents with  . Sore Throat    HPI [name] is a 62 y.o. male.   [name] presents with complaints of sore throat which started approximately 1 week ago, had improved, and then worse again last night. Worse at night. Has been drinking honey and tea which helps some. States he has also been in the process of weaning himself off of xanax which he has done slowly, off completley 8/17. Had taken for 30 years. Still takes dilaudid for chronic back pain. No cough, congestion or ear pain. No fevers. No known ill contacts. No chest pain . States has had dizziness but not currently, he feels it was related to anxiety. Hx of alcohol abuse, bipolar, depression, hep c, htn, pancreatitis, sleep apnea.     ROS per HPI.      Past Medical History:  Diagnosis Date  . Alcohol abuse   . Allergy   . Bipolar disorder ([place])   . Chronic pain    lower back pain, "that has progressed"  . Depression   . Diverticulosis   . Dyspnea   . Hepatitis C    hx. of"states he no longer has"" is clear".  . History of recreational drug use    "cocaine, marijuana, polysubstance" quit 1987.  Marland Kitchen HLD (hyperlipidemia)   . Hypertension   . Pancreatitis 2015  . Panic attack   . Sleep apnea    no cpap used,still has machine(use rarely)    Patient Active Problem List   Diagnosis Date Noted  . Well adult exam 07/10/2017  . Abdominal pain, chronic, epigastric 11/30/2014  . HLD (hyperlipidemia) 08/25/2014  . Dizziness 09/28/2013  . Memory problem 12/25/2011  . Sore throat 12/25/2011  . OSA on CPAP 09/20/2009  . HYPERTENSION, BENIGN - Diet Controlled 10/19/2008  . RHINITIS 05/13/2007  . HEPATITIS C 06/05/2006  . DEPRESSION, MAJOR, RECURRENT 06/05/2006  . BIPOLAR DISORDER 06/05/2006  . ANXIETY 06/05/2006  . Hx of Alcohol abuse 06/05/2006  . BACK PAIN, LOW 06/05/2006     Past Surgical History:  Procedure Laterality Date  . CARDIAC CATHETERIZATION  2003   No CAD  . EUS N/A 06/23/2014   Procedure: UPPER ENDOSCOPIC ULTRASOUND (EUS) LINEAR;  Surgeon: Milus Banister, MD;  Location: [facility];  Service: Endoscopy;  Laterality: N/A;  . sinus surgery    . TONSILLECTOMY         Home Medications    Prior to Admission medications   Medication Sig Start Date End Date Taking? Authorizing Provider  ALPRAZolam Duanne Moron) 0.5 MG tablet Take 0.5 mg 3 (three) times daily by mouth. 01/26/17   [provider]  cholecalciferol (VITAMIN D) 1000 UNITS tablet Take 2,000 Units by mouth daily.    [provider]  Cyanocobalamin (VITAMIN B-12 PO) Take 1 tablet by mouth daily.    [provider]  fluticasone (FLONASE) 50 MCG/ACT nasal spray SPRAY 2 SPRAYS INTO EACH NOSTRIL EVERY DAY 06/17/17   Mayo, Pete Pelt, MD  Glucosamine-Chondroitin (GLUCOSAMINE CHONDR COMPLEX PO) Take 1 tablet by mouth daily.    [provider]  HYDROmorphone (DILAUDID) 4 MG tablet Take 4 mg by mouth as needed.  07/30/16   [provider]  Multiple Vitamin (MULTIVITAMIN WITH MINERALS) TABS Take 1 tablet by mouth daily.     [provider]  nitroGLYCERIN (NITROSTAT) 0.3 MG SL  tablet Place 1 tablet (0.3 mg total) under the tongue every 5 (five) minutes as needed for chest pain. 07/26/16   Mikell, Jeani Sow, MD  ondansetron (ZOFRAN) 4 MG tablet Take 1 tablet (4 mg total) by mouth every 8 (eight) hours as needed for nausea or vomiting. 11/05/16   Mayo, Pete Pelt, MD  propranolol (INDERAL) 60 MG tablet Take 60 mg by mouth 2 (two) times daily. 05/09/17   [provider]  TURMERIC PO Take 1 tablet by mouth daily.    [provider]    Family History Family History  Problem Relation Age of Onset  . Heart disease Mother   . Kidney disease Mother   . COPD Mother   . Lupus Mother   . Heart disease Father   . Breast cancer Maternal  Grandmother   . Diabetes Maternal Grandfather     Social History Social History   Tobacco Use  . Smoking status: Former Smoker    Packs/day: 2.00    Years: 16.00    Pack years: 32.00    Types: Cigarettes    Last attempt to quit: 04/08/2002    Years since quitting: 15.6  . Smokeless tobacco: Never Used  Substance Use Topics  . Alcohol use: No    Alcohol/week: 0.0 standard drinks    Comment: Quit in 1987  . Drug use: No    Comment: Use to take cocaine, marijuana, per patient everything. Stopped in 1987.     Allergies   Morphine and related   Review of Systems Review of Systems   Physical Exam Triage Vital Signs ED Triage Vitals [11/27/17 1828]  Enc Vitals Group     BP (!) 144/88     Pulse Rate 67     Resp 18     Temp 98.3 F (36.8 C)     Temp src      SpO2 97 %     Weight      Height      Head Circumference      Peak Flow      Pain Score      Pain Loc      Pain Edu?      Excl. in [place]?    No data found.  Updated Vital Signs BP (!) 144/88   Pulse 67   Temp 98.3 F (36.8 C)   Resp 18   SpO2 97%    Physical Exam  Constitutional: He is oriented to person, place, and time. He appears well-developed and well-nourished.  HENT:  Head: Normocephalic and atraumatic.  Right Ear: Tympanic membrane, external ear and ear canal normal.  Left Ear: Tympanic membrane, external ear and ear canal normal.  Nose: Nose normal. Right sinus exhibits no maxillary sinus tenderness and no frontal sinus tenderness. Left sinus exhibits no maxillary sinus tenderness and no frontal sinus tenderness.  Mouth/Throat: Uvula is midline, oropharynx is clear and moist and mucous membranes are normal. Tonsils are 0 on the right. Tonsils are 0 on the left. No tonsillar exudate.  Eyes: Pupils are equal, round, and reactive to light. Conjunctivae are normal.  Neck: Normal range of motion.  Cardiovascular: Normal rate and regular rhythm.  Pulmonary/Chest: Effort normal and breath sounds  normal.  Lymphadenopathy:    He has no cervical adenopathy.  Neurological: He is alert and oriented to person, place, and time.  Skin: Skin is warm and dry.  Vitals reviewed.    UC Treatments / Results  Labs (all labs ordered are  listed, but only abnormal results are displayed) Labs Reviewed  CULTURE, GROUP A STREP [facility])  POCT RAPID STREP A    EKG None  Radiology No results found.  Procedures Procedures (including critical care time)  Medications Ordered in UC Medications - No data to display  Initial Impression / Assessment and Plan / UC Course  I have reviewed the triage vital signs and the nursing notes.  Pertinent labs & imaging results that were available during my care of the patient were reviewed by me and considered in my medical decision making (see chart for details).     Non toxic in appearance. Afebrile. Vitals stable at this time. No diaphoresis. Negative rapid strep, culture pending. Viral illness vs related to decrease sleep related to anxiety/xanax wean? Discussed return precautions. Encouraged follow up with PCP for recheck and med management. Patient verbalized understanding and agreeable to plan.    Final Clinical Impressions(s) / UC Diagnoses   Final diagnoses:  Pharyngitis, unspecified etiology     Discharge Instructions     Throat lozenges, gargles, chloraseptic spray, warm teas, popsicles etc to help with throat pain.   Push fluids to ensure adequate hydration and keep secretions thin.   Daily flonase may still help with secretions to help with sore throat.  We have sent your throat swab for culture and will notify you of any positive results.  Please establish with a primary care provider for continued management as needed.  If develop worsening of symptoms, dizzy, chest pain , fevers, sweating, worsening of pain please return or go to the Er.    ED Prescriptions    None     Controlled Substance Prescriptions [place] Controlled Substance  Registry consulted? Not Applicable   Zigmund Gottron, NP 11/27/17 [id number]

## 2017-11-27 NOTE — ED Triage Notes (Signed)
Pt c/o sore throat for the last couple days.

## 2017-11-27 NOTE — Discharge Instructions (Signed)
Throat lozenges, gargles, chloraseptic spray, warm teas, popsicles etc to help with throat pain.   Push fluids to ensure adequate hydration and keep secretions thin.   Daily flonase may still help with secretions to help with sore throat.  We have sent your throat swab for culture and will notify you of any positive results.  Please establish with a primary care provider for continued management as needed.  If develop worsening of symptoms, dizzy, chest pain , fevers, sweating, worsening of pain please return or go to the Er.

## 2017-11-30 LAB — CULTURE, GROUP A STREP (THRC)

## 2017-12-02 ENCOUNTER — Encounter: Payer: Medicare Other | Admitting: Psychology

## 2017-12-13 ENCOUNTER — Encounter (HOSPITAL_COMMUNITY): Payer: Self-pay | Admitting: *Deleted

## 2017-12-13 ENCOUNTER — Ambulatory Visit (INDEPENDENT_AMBULATORY_CARE_PROVIDER_SITE_OTHER): Payer: Medicare Other

## 2017-12-13 ENCOUNTER — Ambulatory Visit (HOSPITAL_COMMUNITY)
Admission: EM | Admit: 2017-12-13 | Discharge: 2017-12-13 | Disposition: A | Discharge status: Home / Self Care | Payer: Medicare Other | Attending: Internal Medicine | Admitting: Internal Medicine

## 2017-12-13 DIAGNOSIS — F418 Other specified anxiety disorders: Secondary | ICD-10-CM

## 2017-12-13 DIAGNOSIS — R0602 Shortness of breath: Secondary | ICD-10-CM | POA: Diagnosis not present

## 2017-12-13 DIAGNOSIS — M546 Pain in thoracic spine: Secondary | ICD-10-CM | POA: Diagnosis not present

## 2017-12-13 MED ORDER — KETOROLAC TROMETHAMINE 60 MG/2ML IM SOLN
60.0000 mg | Freq: Once | INTRAMUSCULAR | Status: AC
  Administered 2017-12-13: 60 mg via INTRAMUSCULAR

## 2017-12-13 MED ORDER — KETOROLAC TROMETHAMINE 60 MG/2ML IM SOLN
INTRAMUSCULAR | Status: AC
  Filled 2017-12-13: qty 2

## 2017-12-13 MED ORDER — NAPROXEN 500 MG PO TABS: 500.0000 mg | ORAL_TABLET | Freq: Two times a day (BID) | ORAL | 0 refills | Status: AC

## 2017-12-13 NOTE — ED Triage Notes (Signed)
Pt c/o severe back pain. Pt states he has had chronic back problems since he was 62 years old. Pt states he has been walking a lot and thinks this may be the cause. He is currently being seen by the pain clinic.

## 2017-12-13 NOTE — Discharge Instructions (Addendum)
Your Chest xray and EKG are reassuring here today.  I suspect the short of breath sensation is related to a combination of pain and anxiety.  You may try an additional short course of antiinflammatories, to be started tomorrow morning, to further help with pain.  If develop worsening of pain, shortness of breath , chest pain, weakness, dizziness, headache, nausea, vomiting, vision change or otherwise worsening please return or go to the Er. Please continue to follow with your primary care provider and/or your pain specialist.

## 2017-12-13 NOTE — ED Provider Notes (Signed)
[place]    CSN: [id number] Arrival date & time: 12/13/17  1721     History   Chief Complaint Chief Complaint  Patient presents with  . Back Pain    HPI [name] is a 62 y.o. male.   [name] presents with complaints of increased back pain over the past approximately 2 weeks. No specific known injury. Hx of chronic pain, usually a bit lower in the past. States he has recently weaned himself off of alprazolam, and has been dealing with increased anxiety due to this. To try to help anxiety has been walking a lot. He states he does feel some shortness of breath. He feels this is worse when the pain is worse. Diaphoresis at time. No nausea, vomiting. No arm or jaw pain. Takes dilaudid for chronic back pain and follows with pain management for this. States tries not to take this frequently. No numbness, tingling or weakness to extremities. Denies cough or chest pain. Hx of bipolar, chronic pain, alcohol abuse, bipolar, hx of polysubstance abuse, pancreatitis, htn. Had a cardiac cath in 2003, per PMH documentation no CAD.     ROS per HPI.      Past Medical History:  Diagnosis Date  . Alcohol abuse   . Allergy   . Bipolar disorder ([place])   . Chronic pain    lower back pain, "that has progressed"  . Depression   . Diverticulosis   . Dyspnea   . Hepatitis C    hx. of"states he no longer has"" is clear".  . History of recreational drug use    "cocaine, marijuana, polysubstance" quit 1987.  Marland Kitchen HLD (hyperlipidemia)   . Hypertension   . Pancreatitis 2015  . Panic attack   . Sleep apnea    no cpap used,still has machine(use rarely)    Patient Active Problem List   Diagnosis Date Noted  . Well adult exam 07/10/2017  . Abdominal pain, chronic, epigastric 11/30/2014  . HLD (hyperlipidemia) 08/25/2014  . Dizziness 09/28/2013  . Memory problem 12/25/2011  . Sore throat 12/25/2011  . OSA on CPAP 09/20/2009  . HYPERTENSION, BENIGN - Diet Controlled 10/19/2008  .  RHINITIS 05/13/2007  . HEPATITIS C 06/05/2006  . DEPRESSION, MAJOR, RECURRENT 06/05/2006  . BIPOLAR DISORDER 06/05/2006  . ANXIETY 06/05/2006  . Hx of Alcohol abuse 06/05/2006  . BACK PAIN, LOW 06/05/2006    Past Surgical History:  Procedure Laterality Date  . CARDIAC CATHETERIZATION  2003   No CAD  . EUS N/A 06/23/2014   Procedure: UPPER ENDOSCOPIC ULTRASOUND (EUS) LINEAR;  Surgeon: Milus Banister, MD;  Location: [facility];  Service: Endoscopy;  Laterality: N/A;  . sinus surgery    . TONSILLECTOMY         Home Medications    Prior to Admission medications   Medication Sig Start Date End Date Taking? Authorizing Provider  ALPRAZolam Duanne Moron) 0.5 MG tablet Take 0.5 mg 3 (three) times daily by mouth. 01/26/17   [provider]  cholecalciferol (VITAMIN D) 1000 UNITS tablet Take 2,000 Units by mouth daily.    [provider]  Cyanocobalamin (VITAMIN B-12 PO) Take 1 tablet by mouth daily.    [provider]  fluticasone (FLONASE) 50 MCG/ACT nasal spray SPRAY 2 SPRAYS INTO EACH NOSTRIL EVERY DAY 06/17/17   Mayo, Pete Pelt, MD  Glucosamine-Chondroitin (GLUCOSAMINE CHONDR COMPLEX PO) Take 1 tablet by mouth daily.    [provider]  HYDROmorphone (DILAUDID) 4 MG tablet Take 4 mg by  mouth as needed.  07/30/16   [provider]  Multiple Vitamin (MULTIVITAMIN WITH MINERALS) TABS Take 1 tablet by mouth daily.     [provider]  naproxen (NAPROSYN) 500 MG tablet Take 1 tablet (500 mg total) by mouth 2 (two) times daily for 5 days. 12/13/17 12/18/17  Zigmund Gottron, NP  nitroGLYCERIN (NITROSTAT) 0.3 MG SL tablet Place 1 tablet (0.3 mg total) under the tongue every 5 (five) minutes as needed for chest pain. 07/26/16   Mikell, Jeani Sow, MD  ondansetron (ZOFRAN) 4 MG tablet Take 1 tablet (4 mg total) by mouth every 8 (eight) hours as needed for nausea or vomiting. 11/05/16   Mayo, Pete Pelt, MD  propranolol (INDERAL) 60 MG tablet Take 60  mg by mouth 2 (two) times daily. 05/09/17   [provider]  TURMERIC PO Take 1 tablet by mouth daily.    [provider]    Family History Family History  Problem Relation Age of Onset  . Heart disease Mother   . Kidney disease Mother   . COPD Mother   . Lupus Mother   . Heart disease Father   . Breast cancer Maternal Grandmother   . Diabetes Maternal Grandfather     Social History Social History   Tobacco Use  . Smoking status: Former Smoker    Packs/day: 2.00    Years: 16.00    Pack years: 32.00    Types: Cigarettes    Last attempt to quit: 04/08/2002    Years since quitting: 15.6  . Smokeless tobacco: Never Used  Substance Use Topics  . Alcohol use: No    Alcohol/week: 0.0 standard drinks    Comment: Quit in 1987  . Drug use: No    Comment: Use to take cocaine, marijuana, per patient everything. Stopped in 1987.     Allergies   Morphine and related   Review of Systems Review of Systems   Physical Exam Triage Vital Signs ED Triage Vitals  Enc Vitals Group     BP 12/13/17 1802 137/90     Pulse Rate 12/13/17 1800 77     Resp 12/13/17 1800 18     Temp 12/13/17 1800 98.2 F (36.8 C)     Temp Source 12/13/17 1800 Oral     SpO2 12/13/17 1800 99 %     Weight --      Height --      Head Circumference --      Peak Flow --      Pain Score 12/13/17 1801 8     Pain Loc --      Pain Edu? --      Excl. in [place]? --    No data found.  Updated Vital Signs BP 137/90   Pulse 77   Temp 98.2 F (36.8 C) (Oral)   Resp 18   SpO2 99%    Physical Exam  Constitutional: He is oriented to person, place, and time. He appears well-developed and well-nourished.  Cardiovascular: Normal rate and regular rhythm.  Pulmonary/Chest: Effort normal and breath sounds normal.  Musculoskeletal:       Thoracic back: He exhibits tenderness, bony tenderness and pain. He exhibits normal range of motion, no swelling, no edema, no deformity, no laceration, no spasm and  normal pulse.       Back:  Distal thoracic spine with tenderness and left sided surrounding muscular tenderness on palpation; no alteration in ROM, noted increased pain with ROM however; full ROM  of upper extremities; strength equal bilaterally; gross sensation intact; no step off or deformity to spinous processes   Neurological: He is alert and oriented to person, place, and time.  Skin: Skin is warm and dry.   EKG NSR rate 69 without acute changes.   UC Treatments / Results  Labs (all labs ordered are listed, but only abnormal results are displayed) Labs Reviewed - No data to display  EKG None  Radiology Dg Chest 2 View  Result Date: 12/13/2017 CLINICAL DATA:  Acute shortness of breath for 2 weeks. EXAM: CHEST - 2 VIEW COMPARISON:  08/02/2016 and prior chest radiographs FINDINGS: The cardiomediastinal silhouette is unremarkable. There is no evidence of focal airspace disease, pulmonary edema, suspicious pulmonary nodule/mass, pleural effusion, or pneumothorax. No acute bony abnormalities are identified. IMPRESSION: No active cardiopulmonary disease. Electronically Signed   By: Margarette Canada M.D.   On: 12/13/2017 18:55    Procedures Procedures (including critical care time)  Medications Ordered in UC Medications  ketorolac (TORADOL) injection 60 mg (60 mg Intramuscular Given 12/13/17 1909)    Initial Impression / Assessment and Plan / UC Course  I have reviewed the triage vital signs and the nursing notes.  Pertinent labs & imaging results that were available during my care of the patient were reviewed by me and considered in my medical decision making (see chart for details).     Shortness of breath in presence of back pain, as well as increased anxiety. ekg and cxr obtained here today. cxr without any indication of mediastinal widening. ekg without acute changes. Vitals are reassuring here today. Symptoms have not acutely worsened today but have been ongoing. Chronic pain history,  no known new injury. No cardiac history. Recent wean from xanax and with increased anxiety. Suspect this is causing increased shortness of breath sensation. Patient states feels improved even shortly after receiving toradol in clinic here today. Return precautions provided. Encouraged close follow up with PCP. Patient verbalized understanding and agreeable to plan.  Ambulatory out of clinic without difficulty.   Final Clinical Impressions(s) / UC Diagnoses   Final diagnoses:  Acute midline thoracic back pain     Discharge Instructions     Your Chest xray and EKG are reassuring here today.  I suspect the short of breath sensation is related to a combination of pain and anxiety.  You may try an additional short course of antiinflammatories, to be started tomorrow morning, to further help with pain.  If develop worsening of pain, shortness of breath , chest pain, weakness, dizziness, headache, nausea, vomiting, vision change or otherwise worsening please return or go to the Er. Please continue to follow with your primary care provider and/or your pain specialist.      ED Prescriptions    Medication Sig Dispense Auth. Provider   naproxen (NAPROSYN) 500 MG tablet Take 1 tablet (500 mg total) by mouth 2 (two) times daily for 5 days. 10 tablet Zigmund Gottron, NP     Controlled Substance Prescriptions [place] Controlled Substance Registry consulted? Not Applicable   Zigmund Gottron, NP 12/13/17 [id number]

## 2018-01-06 ENCOUNTER — Encounter: Payer: Medicare Other | Admitting: Psychology

## 2018-02-06 ENCOUNTER — Other Ambulatory Visit: Payer: Self-pay

## 2018-02-06 ENCOUNTER — Ambulatory Visit (INDEPENDENT_AMBULATORY_CARE_PROVIDER_SITE_OTHER): Payer: Medicare Other | Admitting: Student in an Organized Health Care Education/Training Program

## 2018-02-06 VITALS — BP 122/72 | HR 66 | Temp 98.1°F | Ht 67.0 in | Wt 167.6 lb

## 2018-02-06 DIAGNOSIS — R195 Other fecal abnormalities: Secondary | ICD-10-CM

## 2018-02-06 DIAGNOSIS — R682 Dry mouth, unspecified: Secondary | ICD-10-CM | POA: Diagnosis present

## 2018-02-06 DIAGNOSIS — Z23 Encounter for immunization: Secondary | ICD-10-CM | POA: Diagnosis not present

## 2018-02-06 NOTE — Progress Notes (Signed)
   CC: dry mouth, stomach discomfort  HPI: [name] is a 62 y.o. male with PMH significant for bipolar, Hep C, HTN, HLD, chronic abdominal pain, who presents with mouth dryness and abdominal discomfort.  Mouth Dryness - patient notes mouth dryness that has been bothersome over the past two weeks. The mouth dryness is intermittent and episodic, lasting a few minutes and then subsiding. He has been drinking plenty of fluids and rinsing his mouth without improvement. Of note, he recently has had worsening in his chronic back pain for which he takes opioid medications and he reports he has relied more on his opioids over the past two weeks (taking more) than other times when his abdominal pain is better controlled. No sores in his mouth. No white tongue. No other changes in medications. No pain. No fevers. No constipation.  Abdominal discomfort - patient has had generalized and RUQ abdominal discomfort over the past few weeks. He feels this is most likely coming from a flare of chronic back pain, however would like to be checked out. He reports having pancreatitis in the past however he has not been vomiting. He has been taking pepto-bismol and has had dark stools. He does not note any specific palliating or exacerbating factors. Discomfort has been constant and does not radiate. No diarrhea or constipation.   Review of Symptoms:  See HPI for ROS.   CC, SH/smoking status, and VS noted.  Objective: BP 122/72   Pulse 66   Temp 98.1 F (36.7 C) (Oral)   Ht 5\' 7"  (1.702 m)   Wt 167 lb 9.6 oz (76 kg)   SpO2 98%   BMI 26.25 kg/m  GEN: NAD, alert, cooperative, and pleasant. EYE: no conjunctival injection, pupils equally round and reactive to light ENMT: normal tympanic light reflex, no nasal polyps,no rhinorrhea, no pharyngeal erythema or exudates, no oropharyngeal lesions or ulcers, mucous membranes appear moist NECK: full ROM RESPIRATORY: clear to auscultation bilaterally with no wheezes,  rhonchi or rales, good effort CV: RRR, no m/r/g, no peripheral edema GI: soft, non-tender to deep palpation, non-distended, no hepatosplenomegaly, negative murphy sign, normoactive BS in 4 quadrants SKIN: warm and dry, no rashes or lesions NEURO: II-XII grossly intact, normal gait, peripheral sensation intact PSYCH: AAOx3, appropriate affect   Assessment and plan:  Dry mouth - uncertain etiology but may be related to recent increase in narcotic use for chronic back pain. Med list reviewed with patient, no other likely causes for dry mouth noted. Recommend biotene mouth wash. Discussed this etiology with the patient. Follow up if symptoms worsen.  Abdominal pain - abdominal exam is very benign. Considered pancreatitis flare, however less likely given no N/V and tolerating food fine. Pain may be referred discomfort related to his back as he suspects. GB pathology less likely with benign exam, pain is not related to eating. Suspect that dark stools are 2/2 pepto-bismol use, however patient was given a stool guaiac card to complete at home and bring back to the lab for testing. Patient plans to take antacid medication at home and follow up if symptoms worsen or fail to improve. Strict return precautions were provided.  Everrett Coombe, MD,MS,  PGY3 02/06/2018 3:36 PM

## 2018-02-06 NOTE — Patient Instructions (Signed)
It was a pleasure seeing you today in our clinic.  - continue to use biotene mouth rinses - return with the stool guaiac card within 5 days of collecting the sample  Our clinic's number is [phone]. Please call with questions or concerns about what we discussed today.  Be well, Dr. Burr Medico

## 2018-02-08 ENCOUNTER — Encounter: Payer: Self-pay | Admitting: Student in an Organized Health Care Education/Training Program

## 2018-02-12 LAB — HEMOCCULT GUIAC POC 1CARD (OFFICE): Fecal Occult Blood, POC: NEGATIVE

## 2018-02-12 NOTE — Addendum Note (Signed)
Addended by: Londell Moh T on: 02/12/2018 11:35 AM   Modules accepted: Orders

## 2018-02-13 ENCOUNTER — Encounter: Payer: Self-pay | Admitting: Student in an Organized Health Care Education/Training Program

## 2018-05-18 ENCOUNTER — Ambulatory Visit (INDEPENDENT_AMBULATORY_CARE_PROVIDER_SITE_OTHER): Payer: Medicare Other | Admitting: Family Medicine

## 2018-05-18 VITALS — BP 119/80 | HR 79 | Temp 98.2°F | Wt 164.2 lb

## 2018-05-18 DIAGNOSIS — R1032 Left lower quadrant pain: Secondary | ICD-10-CM | POA: Diagnosis not present

## 2018-05-18 DIAGNOSIS — M549 Dorsalgia, unspecified: Secondary | ICD-10-CM | POA: Diagnosis present

## 2018-05-18 LAB — POCT URINALYSIS DIP (MANUAL ENTRY)
Bilirubin, UA: NEGATIVE
Blood, UA: NEGATIVE
Glucose, UA: NEGATIVE mg/dL
Ketones, POC UA: NEGATIVE mg/dL
Leukocytes, UA: NEGATIVE
Nitrite, UA: NEGATIVE
Protein Ur, POC: NEGATIVE mg/dL
Spec Grav, UA: 1.01
Urobilinogen, UA: 0.2 U/dL
pH, UA: 5.5

## 2018-05-18 MED ORDER — TAMSULOSIN HCL 0.4 MG PO CAPS: 0.4000 mg | ORAL_CAPSULE | Freq: Every day | ORAL | 0 refills | Status: DC

## 2018-05-18 NOTE — Patient Instructions (Addendum)
It was great to meet you today! Thank you for letting me participate in your care!  Today, we discussed your left groin pain which could be due to hip OA. I am ordering x-rays of your hip to help [place] determine if you have left hip osteoarthritis. I am also concerned you may have a small kidney stone so please drink lots of water over the next two weeks (10 16oz bottles per day). I am sending in a medication to help you pee and if a stone is there this will help you pass it.  Be well, Harolyn Rutherford, DO PGY-2, [facility]

## 2018-05-18 NOTE — Progress Notes (Signed)
Subjective: Chief Complaint  Patient presents with  . Back Pain    HPI: [name] is a 63 y.o. presenting to clinic today to discuss the following:  Groin Pain Patient with long standing chronic back pain presents today as he states his pain is now radiating into his groin on the left side that has been constant and started about 1 week ago. He describes the pain as a "throbbing" pain that came on after doing a lot of heavy lifting. He has had this before but "never this bad". He states he feels like "something is there". It gets worse when sitting and better with laying down or standing. His long standing back pain is unchanged and is radiating down his left leg. He endorses associated nausea, urinary pain and abdominal pain with no urinary frequency or urgency. He denies any loss of bowel or bladder control and has no loss of sensation in his groin area. He takes dilaudid for his back pain.  No fever, chills, vomiting, blood in the urine or stool.     ROS noted in HPI.   Past Medical, Surgical, Social, and Family History Reviewed & Updated per EMR.   Pertinent Historical Findings include:   Social History   Tobacco Use  Smoking Status Former Smoker  . Packs/day: 2.00  . Years: 16.00  . Pack years: 32.00  . Types: Cigarettes  . Last attempt to quit: 04/08/2002  . Years since quitting: 16.1  Smokeless Tobacco Never Used    Objective: BP 119/80   Pulse 79   Temp 98.2 F (36.8 C) (Oral)   Wt 164 lb 3.2 oz (74.5 kg)   SpO2 96%   BMI 25.72 kg/m  Vitals and nursing notes reviewed  Physical Exam Gen: Alert and Oriented x 3, NAD HEENT: Normocephalic, atraumatic CV: RRR, no murmurs, normal S1, S2 split Resp: CTAB, no wheezing, rales, or rhonchi, comfortable work of breathing Abd: non-distended, non-tender, soft, +bs in all four quadrants GU: No CVA tenderness, no masses or hernia in the scrotum, no inguinal hernia appreciated on exam; no rashes or discoloration,  testicles not tender to palpation. No discharge. MSK: Right Hip           No gross or obvious deformity, greater trochanter non-tender to palpation, ROM full in all directions with pain on internal rotation with leg fully extended; Strength 5/5 in IR/ER/Flex/Ext/Abd/Add. Pelvic alignment unremarkable to inspection and palpation. Standing hip rotation and gait without trendelenburg / unsteadiness. SI joint tenderness and normal minimal SI movement. +FADIR, -FABER Ext: no clubbing, cyanosis, or edema Skin: warm, dry, intact, no rashes  Results for orders placed or performed in visit on 05/18/18 (from the past 72 hour(s))  POCT urinalysis dipstick     Status: None   Collection Time: 05/18/18  2:20 PM  Result Value Ref Range   Color, UA yellow yellow   Clarity, UA clear clear   Glucose, UA negative negative mg/dL   Bilirubin, UA negative negative   Ketones, POC UA negative negative mg/dL   Spec Grav, UA 1.010 1.010 - 1.025   Blood, UA negative negative   pH, UA 5.5 5.0 - 8.0   Protein Ur, POC negative negative mg/dL   Urobilinogen, UA 0.2 0.2 or 1.0 E.U./dL   Nitrite, UA Negative Negative   Leukocytes, UA Negative Negative    Assessment/Plan:  Groin pain, left Most likely due to non-obstructing kidney stone given back pain that radiates to the groin with associated nausea and  urinary pain. Unlikely to be pyelonephritis as no CVA tenderness, no fever, chills, no vomiting. His U/A was unremarkable so cystitis is less likely. No signs of hernia or testicular torsion on exam and pain does not fit pattern of testicular torsion.   Of note, patient also did show signs consistent with left hip osteoarthritis on exam. - Left Hip x-ray - Flomax daily for two weeks - Increase water consumption to 160oz per day for the next 7-10 days until symptoms resolve.  Red flag symptoms and return precautions discussed.   PATIENT EDUCATION PROVIDED: See AVS    Diagnosis and plan along with any newly  prescribed medication(s) were discussed in detail with this patient today. The patient verbalized understanding and agreed with the plan. Patient advised if symptoms worsen return to clinic or ER.    Orders Placed This Encounter  Procedures  . DG Hip Unilat W OR W/O Pelvis 2-3 Views Left    Standing Status:   Future    Number of Occurrences:   1    Standing Expiration Date:   08/16/2018    Order Specific Question:   Reason for Exam (SYMPTOM  OR DIAGNOSIS REQUIRED)    Answer:   groin pain    Order Specific Question:   Preferred imaging location?    Answer:   [id number]    Order Specific Question:   Radiology Contrast Protocol - do NOT remove file path    Answer:   [email or web address]  . POCT urinalysis dipstick    Meds ordered this encounter  Medications  . tamsulosin (FLOMAX) 0.4 MG CAPS capsule    Sig: Take 1 capsule (0.4 mg total) by mouth daily for 14 days.    Dispense:  14 capsule    Refill:  0     Harolyn Rutherford, DO 05/18/2018, 1:54 PM PGY-2 [place]

## 2018-05-19 ENCOUNTER — Ambulatory Visit
Admission: RE | Admit: 2018-05-19 | Discharge: 2018-05-19 | Disposition: A | Discharge status: Home / Self Care | Payer: Medicare Other | Source: Ambulatory Visit | Attending: Family Medicine | Admitting: Family Medicine

## 2018-05-19 DIAGNOSIS — R1032 Left lower quadrant pain: Secondary | ICD-10-CM

## 2018-05-20 ENCOUNTER — Encounter: Payer: Self-pay | Admitting: Family Medicine

## 2018-05-20 DIAGNOSIS — R1032 Left lower quadrant pain: Secondary | ICD-10-CM | POA: Insufficient documentation

## 2018-05-20 NOTE — Assessment & Plan Note (Signed)
Most likely due to non-obstructing kidney stone given back pain that radiates to the groin with associated nausea and urinary pain. Unlikely to be pyelonephritis as no CVA tenderness, no fever, chills, no vomiting. His U/A was unremarkable so cystitis is less likely. No signs of hernia or testicular torsion on exam and pain does not fit pattern of testicular torsion.   Of note, patient also did show signs consistent with left hip osteoarthritis on exam. - Left Hip x-ray - Flomax daily for two weeks - Increase water consumption to 160oz per day for the next 7-10 days until symptoms resolve.  Red flag symptoms and return precautions discussed.

## 2018-05-22 ENCOUNTER — Telehealth: Payer: Self-pay

## 2018-05-22 NOTE — Telephone Encounter (Signed)
Pt informed. Breda Bond, CMA  

## 2018-05-22 NOTE — Telephone Encounter (Signed)
Patient calling for xray results. Danley Danker, RN [facility] RN)

## 2018-07-21 ENCOUNTER — Telehealth: Payer: Self-pay | Admitting: Family Medicine

## 2018-07-21 NOTE — Telephone Encounter (Signed)
Has been sick about 1 week like he has the flu. Feels tired and has headache. Also has sore throat with stuffy nose with sinus issues. Thought it was pollen but has lasted too long. Started to feel a little better since yesterday. No cough or shortness of breath. No chest pain. Recommended supportive care at home with self monitoring and isolation. Given CDC website with recommendations via mychart. Discussed going to ER for any chest pain or shortness of breath. Discussed calling back for prolonged course of illness or double worsening.

## 2018-07-22 ENCOUNTER — Other Ambulatory Visit: Payer: Self-pay | Admitting: Family Medicine

## 2019-01-15 ENCOUNTER — Ambulatory Visit (INDEPENDENT_AMBULATORY_CARE_PROVIDER_SITE_OTHER): Payer: Medicare Other | Admitting: Family Medicine

## 2019-01-15 ENCOUNTER — Other Ambulatory Visit: Payer: Self-pay

## 2019-01-15 ENCOUNTER — Encounter: Payer: Self-pay | Admitting: Family Medicine

## 2019-01-15 VITALS — BP 118/68 | HR 67 | Ht 67.0 in | Wt 167.0 lb

## 2019-01-15 DIAGNOSIS — G8929 Other chronic pain: Secondary | ICD-10-CM

## 2019-01-15 DIAGNOSIS — R1013 Epigastric pain: Secondary | ICD-10-CM

## 2019-01-15 DIAGNOSIS — Z23 Encounter for immunization: Secondary | ICD-10-CM | POA: Diagnosis not present

## 2019-01-15 MED ORDER — OMEPRAZOLE 40 MG PO CPDR: 40.0000 mg | DELAYED_RELEASE_CAPSULE | Freq: Every day | ORAL | 3 refills | Status: DC

## 2019-01-15 MED ORDER — ONDANSETRON HCL 4 MG PO TABS: 4.0000 mg | ORAL_TABLET | Freq: Three times a day (TID) | ORAL | 0 refills | Status: DC | PRN

## 2019-01-15 NOTE — Assessment & Plan Note (Signed)
Chronic abdominal pain with history of H. pylori.  Status post triple therapy.  Has had normal EGD and colonoscopy in the past.  Likely return of GERD symptoms.  Will start on PPI.  Low suspicion for malignancy at this time given no weight loss, hematochezia, melena, night sweats.  We will also refill Zofran.  If no improvement, will refer back to GI for further work-up.

## 2019-01-15 NOTE — Patient Instructions (Signed)
It was a pleasure to see you today! Thank you for choosing [facility] for your primary care. [name] was seen for abdominal pain.   I think this is likely recurrent of your acid reflux.  We will re-start you on an acid suppression pill.  Come back to clinic if you do not have any improvement in about a month.  Best,  Marny Lowenstein, MD, MS FAMILY MEDICINE RESIDENT - PGY3 01/15/2019 4:21 PM

## 2019-01-15 NOTE — Progress Notes (Signed)
    Subjective:  [name] is a 63 y.o. male who presents to the [facility] today with a chief complaint of abdominal pain.   HPI: Patient presents with unknown length of abdominal pain.  Has had chronic abdominal pain in the past.  Stated abdominal pain is epigastric and left lower quadrant.  Describes as burning.  Says he takes an antacid and this does help.  Currently not on a PPI or H2 blocker.  Has had H. pylori in the past and has been followed by GI.  He was apparently cured of H. pylori after triple therapy.  Does have some nausea with abdominal pain, but denies any vomiting.  Pain is not constant, but is around "most of the time".  Patient does have some sour taste in the back of his mouth.  Does have a history of GERD.  Tries to moderate his diet such as eating vegetables, not eating fried foods, trying to avoid eating late at night.  Patient has taken some NSAIDs recently for back pain.  He is seen by pain specialist for his chronic back pain.  Denies smoking and alcohol use.  Denies night sweats and unintentional weight loss.  Has not had any blood in his stool, has not had a change in the caliber of his stool.  Says he has regular bowel movements and takes laxatives as needed.  Patient does on the chronic opioid for back pain.  ROS: Per HPI   Objective:  Physical Exam: BP 118/68   Pulse 67   Ht 5\' 7"  (1.702 m)   Wt 167 lb (75.8 kg)   SpO2 97%   BMI 26.16 kg/m   Gen: NAD, resting comfortably CV: RRR with no murmurs appreciated Pulm: NWOB, CTAB with no crackles, wheezes, or rhonchi GI: Soft, LLQ and epigastric tenderness,  MSK: no edema, cyanosis, or clubbing noted Skin: warm, dry Neuro: grossly normal, moves all extremities Psych: Normal affect and thought content  No results found for this or any previous visit (from the past 72 hour(s)).   Assessment/Plan:  Abdominal pain, chronic, epigastric Chronic abdominal pain with history of H. pylori.  Status post triple therapy.   Has had normal EGD and colonoscopy in the past.  Likely return of GERD symptoms.  Will start on PPI.  Low suspicion for malignancy at this time given no weight loss, hematochezia, melena, night sweats.  We will also refill Zofran.  If no improvement, will refer back to GI for further work-up.   Lab Orders  No laboratory test(s) ordered today    Meds ordered this encounter  Medications  . omeprazole (PRILOSEC) 40 MG capsule    Sig: Take 1 capsule (40 mg total) by mouth daily.    Dispense:  30 capsule    Refill:  3  . ondansetron (ZOFRAN) 4 MG tablet    Sig: Take 1 tablet (4 mg total) by mouth every 8 (eight) hours as needed for nausea or vomiting.    Dispense:  30 tablet    Refill:  0      Marny Lowenstein, MD, MS FAMILY MEDICINE RESIDENT - PGY3 01/15/2019 5:07 PM

## 2019-02-10 ENCOUNTER — Ambulatory Visit: Payer: Medicare Other | Admitting: Family Medicine

## 2019-03-24 ENCOUNTER — Other Ambulatory Visit: Payer: Self-pay

## 2019-03-24 ENCOUNTER — Emergency Department (HOSPITAL_COMMUNITY)
Admission: EM | Admit: 2019-03-24 | Discharge: 2019-03-25 | Disposition: A | Discharge status: Home / Self Care | Payer: Medicare Other | Attending: Emergency Medicine | Admitting: Emergency Medicine

## 2019-03-24 ENCOUNTER — Emergency Department (HOSPITAL_COMMUNITY): Payer: Medicare Other

## 2019-03-24 ENCOUNTER — Encounter (HOSPITAL_COMMUNITY): Payer: Self-pay | Admitting: *Deleted

## 2019-03-24 ENCOUNTER — Ambulatory Visit (INDEPENDENT_AMBULATORY_CARE_PROVIDER_SITE_OTHER)
Admission: EM | Admit: 2019-03-24 | Discharge: 2019-03-24 | Disposition: A | Discharge status: Home / Self Care | Payer: Medicare Other | Source: Home / Self Care | Attending: Family Medicine | Admitting: Family Medicine

## 2019-03-24 ENCOUNTER — Encounter (HOSPITAL_COMMUNITY): Payer: Self-pay

## 2019-03-24 DIAGNOSIS — J029 Acute pharyngitis, unspecified: Secondary | ICD-10-CM

## 2019-03-24 DIAGNOSIS — G8929 Other chronic pain: Secondary | ICD-10-CM | POA: Insufficient documentation

## 2019-03-24 DIAGNOSIS — K029 Dental caries, unspecified: Secondary | ICD-10-CM | POA: Diagnosis not present

## 2019-03-24 DIAGNOSIS — I10 Essential (primary) hypertension: Secondary | ICD-10-CM | POA: Diagnosis not present

## 2019-03-24 DIAGNOSIS — F319 Bipolar disorder, unspecified: Secondary | ICD-10-CM | POA: Diagnosis not present

## 2019-03-24 DIAGNOSIS — K219 Gastro-esophageal reflux disease without esophagitis: Secondary | ICD-10-CM | POA: Insufficient documentation

## 2019-03-24 DIAGNOSIS — Z79899 Other long term (current) drug therapy: Secondary | ICD-10-CM | POA: Diagnosis not present

## 2019-03-24 DIAGNOSIS — R1013 Epigastric pain: Secondary | ICD-10-CM | POA: Diagnosis not present

## 2019-03-24 DIAGNOSIS — Z87891 Personal history of nicotine dependence: Secondary | ICD-10-CM | POA: Insufficient documentation

## 2019-03-24 DIAGNOSIS — R0789 Other chest pain: Secondary | ICD-10-CM | POA: Diagnosis not present

## 2019-03-24 DIAGNOSIS — R109 Unspecified abdominal pain: Secondary | ICD-10-CM

## 2019-03-24 DIAGNOSIS — R1084 Generalized abdominal pain: Secondary | ICD-10-CM

## 2019-03-24 DIAGNOSIS — Z20828 Contact with and (suspected) exposure to other viral communicable diseases: Secondary | ICD-10-CM | POA: Insufficient documentation

## 2019-03-24 LAB — CBC
HCT: 45.2 % (ref 39.0–52.0)
Hemoglobin: 15 g/dL (ref 13.0–17.0)
MCH: 26.5 pg (ref 26.0–34.0)
MCHC: 33.2 g/dL (ref 30.0–36.0)
MCV: 79.9 fL — ABNORMAL LOW (ref 80.0–100.0)
Platelets: 244 10*3/uL (ref 150–400)
RBC: 5.66 MIL/uL (ref 4.22–5.81)
RDW: 13.7 % (ref 11.5–15.5)
WBC: 10 10*3/uL (ref 4.0–10.5)
nRBC: 0 % (ref 0.0–0.2)

## 2019-03-24 LAB — TROPONIN I (HIGH SENSITIVITY)
Troponin I (High Sensitivity): <2 ng/L (ref <18)
Troponin I (High Sensitivity): 2 ng/L (ref <18)

## 2019-03-24 LAB — BASIC METABOLIC PANEL
Anion gap: 10 (ref 5–15)
BUN: 13 mg/dL (ref 8–23)
CO2: 24 mmol/L (ref 22–32)
Calcium: 9.6 mg/dL (ref 8.9–10.3)
Chloride: 103 mmol/L (ref 98–111)
Creatinine, Ser: 1.03 mg/dL (ref 0.61–1.24)
GFR calc Af Amer: >60 mL/min (ref >60)
GFR calc non Af Amer: >60 mL/min (ref >60)
Glucose, Bld: 113 mg/dL — ABNORMAL HIGH (ref 70–99)
Potassium: 4.2 mmol/L (ref 3.5–5.1)
Sodium: 137 mmol/L (ref 135–145)

## 2019-03-24 LAB — POCT RAPID STREP A: Streptococcus, Group A Screen (Direct): NEGATIVE

## 2019-03-24 MED ORDER — KETOROLAC TROMETHAMINE 30 MG/ML IJ SOLN
30.0000 mg | Freq: Once | INTRAMUSCULAR | Status: AC
  Administered 2019-03-24: 30 mg via INTRAVENOUS
  Filled 2019-03-24: qty 1

## 2019-03-24 MED ORDER — PANTOPRAZOLE SODIUM 40 MG IV SOLR
40.0000 mg | Freq: Once | INTRAVENOUS | Status: AC
  Administered 2019-03-24: 40 mg via INTRAVENOUS
  Filled 2019-03-24: qty 40

## 2019-03-24 MED ORDER — SUCRALFATE 1 GM/10ML PO SUSP: 1.0000 g | Freq: Three times a day (TID) | ORAL | 0 refills | Status: DC

## 2019-03-24 MED ORDER — FAMOTIDINE 20 MG PO TABS: 20.0000 mg | ORAL_TABLET | Freq: Two times a day (BID) | ORAL | 0 refills | Status: DC

## 2019-03-24 MED ORDER — SODIUM CHLORIDE 0.9% FLUSH
3.0000 mL | Freq: Once | INTRAVENOUS | Status: AC
  Administered 2019-03-24: 22:00:00 3 mL via INTRAVENOUS

## 2019-03-24 MED ORDER — LIDOCAINE VISCOUS HCL 2 % MT SOLN
15.0000 mL | Freq: Once | OROMUCOSAL | Status: AC
  Administered 2019-03-24: 15 mL via ORAL
  Filled 2019-03-24: qty 15

## 2019-03-24 MED ORDER — IOHEXOL 300 MG/ML  SOLN
100.0000 mL | Freq: Once | INTRAMUSCULAR | Status: AC | PRN
  Administered 2019-03-24: 100 mL via INTRAVENOUS

## 2019-03-24 MED ORDER — ALUM & MAG HYDROXIDE-SIMETH 200-200-20 MG/5ML PO SUSP
30.0000 mL | Freq: Once | ORAL | Status: AC
  Administered 2019-03-24: 30 mL via ORAL
  Filled 2019-03-24: qty 30

## 2019-03-24 NOTE — Discharge Instructions (Addendum)
Take your lactulose at home for constipation as prescribed by pain management. Follow up with [facility] who you have seen previously for your abdominal pain. I have written for additional prescriptions to help coat your GI tract and stomach.  Return for new or worsening symptoms.

## 2019-03-24 NOTE — ED Notes (Signed)
[name] is back in the lobby

## 2019-03-24 NOTE — ED Provider Notes (Signed)
[facility] EMERGENCY DEPARTMENT Provider Note   CSN: [id number] Arrival date & time: 03/24/19  1931    History Chief Complaint  Patient presents with  . Chest Pain   [name] is a 63 y.o. male with medical history significant for alcohol abuse, last use 20 years ago, bipolar disorder, chronic back pain, chronic abdominal pain, hepatitis C, pancreatitis who presents for evaluation of multiple complaints.  Patient states he has had epigastric pain over the last "few weeks."  Patient states he is also had some burning up into his chest as well as a scratchy throat.  Patient states he has had some constipation, states he has history of constipation usually due to his chronic opioid use, followed by pain management.  He is supposed to be taking lactulose for his constipation.  He has not taken a dose in the last few days.  He has had "small" bowel movements however states he is unable to, "completely clear out."  He denies any melena or bright red blood per rectum.  He was seen in October with family practice for chronic abdominal pain.  He denies any NSAID use, alcohol use.  Pain is not worse with food intake.  He has a history of H. pylori infection.  He was started on PPI from PCP office however patient states he only takes this ulcer at home.  No recent weight loss, hematochezia, melena, night sweats.  Has been seen by GI previously in the past for chronic abdominal pain.  Patient has also been seen multiple times for pharyngitis in the past.  Patient states the symptoms typically come on when he tries to "wean myself off of Xanax."  Also been seen by ENT for the chronic burning sensation in his throat.  No dysphagia or odynophagia.  Prior history of esophageal varices.  Patient denies fever, chills, nausea, vomiting, neck pain, neck stiffness, inability tolerate p.o. intake, exertional pleuritic chest pain, radiation of chest pain, diaphoresis, lightheadedness, dizziness, dysuria.   He denies any recent weight changes.  Has not followed up with his PCP for his symptoms.  His symptoms have been ongoing x1 month. Patient denies any current CP or radiation of pain.  History of pain from patient and past medical records.  No interpreter is used. HPI    PCP- family practice Past Medical History:  Diagnosis Date  . Alcohol abuse   . Allergy   . Bipolar disorder ([place])   . Chronic pain    lower back pain, "that has progressed"  . Depression   . Diverticulosis   . Dyspnea   . Hepatitis C    hx. of"states he no longer has"" is clear".  . History of recreational drug use    "cocaine, marijuana, polysubstance" quit 1987.  Marland Kitchen HLD (hyperlipidemia)   . Hypertension   . Pancreatitis 2015  . Panic attack   . Sleep apnea    no cpap used,still has machine(use rarely)    Patient Active Problem List   Diagnosis Date Noted  . Groin pain, left 05/20/2018  . Back pain 05/18/2018  . Abdominal pain, chronic, epigastric 11/30/2014  . HLD (hyperlipidemia) 08/25/2014  . Memory problem 12/25/2011  . OSA on CPAP 09/20/2009  . HYPERTENSION, BENIGN - Diet Controlled 10/19/2008  . HEPATITIS C 06/05/2006  . DEPRESSION, MAJOR, RECURRENT 06/05/2006  . BIPOLAR DISORDER 06/05/2006  . Hx of Alcohol abuse 06/05/2006    Past Surgical History:  Procedure Laterality Date  . CARDIAC CATHETERIZATION  2003  No CAD  . EUS N/A 06/23/2014   Procedure: UPPER ENDOSCOPIC ULTRASOUND (EUS) LINEAR;  Surgeon: Milus Banister, MD;  Location: [facility];  Service: Endoscopy;  Laterality: N/A;  . sinus surgery    . TONSILLECTOMY         Family History  Problem Relation Age of Onset  . Heart disease Mother   . Kidney disease Mother   . COPD Mother   . Lupus Mother   . Heart disease Father   . Breast cancer Maternal Grandmother   . Diabetes Maternal Grandfather     Social History   Tobacco Use  . Smoking status: Former Smoker    Packs/day: 2.00    Years: 16.00    Pack  years: 32.00    Types: Cigarettes    Quit date: 04/08/2002    Years since quitting: 16.9  . Smokeless tobacco: Never Used  Substance Use Topics  . Alcohol use: No    Alcohol/week: 0.0 standard drinks    Comment: Quit in 1987  . Drug use: No    Comment: Use to take cocaine, marijuana, per patient everything. Stopped in 1987.    Home Medications Prior to Admission medications   Medication Sig Start Date End Date Taking? Authorizing Provider  ALPRAZolam Duanne Moron) 0.5 MG tablet Take 0.5 mg 3 (three) times daily by mouth. 01/26/17   [provider]  cholecalciferol (VITAMIN D) 1000 UNITS tablet Take 2,000 Units by mouth daily.    [provider]  Cyanocobalamin (VITAMIN B-12 PO) Take 1 tablet by mouth daily.    [provider]  famotidine (PEPCID) 20 MG tablet Take 1 tablet (20 mg total) by mouth 2 (two) times daily. 03/24/19   Ilhan Debenedetto A, PA-C  fluticasone (FLONASE) 50 MCG/ACT nasal spray SPRAY 2 SPRAYS INTO EACH NOSTRIL EVERY DAY 06/17/17   Mayo, Pete Pelt, MD  Glucosamine-Chondroitin (GLUCOSAMINE CHONDR COMPLEX PO) Take 1 tablet by mouth daily.    [provider]  HYDROmorphone (DILAUDID) 4 MG tablet Take 4 mg by mouth as needed.  07/30/16   [provider]  Multiple Vitamin (MULTIVITAMIN WITH MINERALS) TABS Take 1 tablet by mouth daily.     [provider]  nitroGLYCERIN (NITROSTAT) 0.3 MG SL tablet Place 1 tablet (0.3 mg total) under the tongue every 5 (five) minutes as needed for chest pain. 07/26/16   Mikell, Jeani Sow, MD  omeprazole (PRILOSEC) 40 MG capsule Take 1 capsule (40 mg total) by mouth daily. 01/15/19   Bonnita Hollow, MD  ondansetron (ZOFRAN) 4 MG tablet Take 1 tablet (4 mg total) by mouth every 8 (eight) hours as needed for nausea or vomiting. 01/15/19   Bonnita Hollow, MD  propranolol (INDERAL) 60 MG tablet Take 60 mg by mouth 2 (two) times daily. 05/09/17   [provider]  sucralfate (CARAFATE) 1  GM/10ML suspension Take 10 mLs (1 g total) by mouth 4 (four) times daily -  with meals and at bedtime. 03/24/19   Hermann Dottavio A, PA-C  TURMERIC PO Take 1 tablet by mouth daily.    [provider]    Allergies    Morphine and related  Review of Systems   Review of Systems  Constitutional: Negative.   HENT: Positive for sore throat (Scratchy, dry throat x 1 month). Negative for congestion, dental problem, drooling, ear discharge, ear pain, facial swelling, hearing loss, mouth sores, nosebleeds, postnasal drip, rhinorrhea, sinus pressure, sinus pain, sneezing, trouble swallowing and voice change.   Eyes:  Negative.   Respiratory: Negative.   Cardiovascular: Positive for chest pain (Burning sensation from abdomen into chest).  Gastrointestinal: Positive for abdominal pain (Chronic, epigastric pain) and constipation. Negative for abdominal distention, anal bleeding, blood in stool, diarrhea, nausea, rectal pain and vomiting.  Genitourinary: Negative.   Musculoskeletal: Negative.   Skin: Negative.   Neurological: Negative.   All other systems reviewed and are negative.   Physical Exam Updated Vital Signs BP (!) 154/113 (BP Location: Right Arm)   Pulse 84   Temp 98 F (36.7 C) (Oral)   Resp 18   SpO2 96%   Physical Exam Vitals and nursing note reviewed.  Constitutional:      General: He is not in acute distress.    Appearance: He is well-developed. He is not ill-appearing, toxic-appearing or diaphoretic.  HENT:     Head: Normocephalic and atraumatic.     Mouth/Throat:     Lips: Pink.     Mouth: Mucous membranes are moist.     Dentition: Does not have dentures. Dental caries present. No dental tenderness, gingival swelling or dental abscesses.     Tongue: No lesions. Tongue does not deviate from midline.     Pharynx: Oropharynx is clear. Uvula midline.     Tonsils: No tonsillar exudate or tonsillar abscesses. 0 on the right. 0 on the left.     Comments: Posterior  oropharynx clear.  Mucous membranes moist.  Tonsils without edema or exudate.  Uvula midline without deviation.  No oral or facial swelling.  Sublingual area soft.  Tongue midline.  No periapical abscesses Eyes:     Extraocular Movements: Extraocular movements intact.     Conjunctiva/sclera: Conjunctivae normal.     Pupils: Pupils are equal, round, and reactive to light.     Comments: EOM intact. No scleral icterus  Neck:     Thyroid: No thyroid mass, thyromegaly or thyroid tenderness.     Vascular: No JVD.     Trachea: Phonation normal.     Comments: Thyroid supple, midline.  No thyromegaly.  Phonation normal.  Trachea midline without deviation.  No cervical lymphadenopathy.  No midline spinal tenderness palpation.  No overlying skin changes, masses to anterior posterior neck. No bruit Cardiovascular:     Rate and Rhythm: Normal rate and regular rhythm.     Pulses: Normal pulses.          Radial pulses are 2+ on the right side and 2+ on the left side.       Dorsalis pedis pulses are 2+ on the right side and 2+ on the left side.       Posterior tibial pulses are 2+ on the right side and 2+ on the left side.     Heart sounds: Normal heart sounds.  Pulmonary:     Effort: Pulmonary effort is normal. No respiratory distress.     Breath sounds: Normal breath sounds.     Comments: Clear to auscultation bilaterally without wheeze, rhonchi or rales. Chest:     Comments: No crepitus, overlying skin changes Abdominal:     General: Bowel sounds are normal. There is no distension.     Palpations: Abdomen is soft.     Tenderness: There is abdominal tenderness. There is no right CVA tenderness, left CVA tenderness, guarding or rebound. Negative signs include Murphy's sign and McBurney's sign.     Hernia: No hernia is present.     Comments: Generalized epigastric tenderness however negative Murphy sign, no rebound or guarding.  No overlying skin changes.  Genitourinary:    Comments: Refused rectal  exam Musculoskeletal:        General: Normal range of motion.     Cervical back: Full passive range of motion without pain, normal range of motion and neck supple.     Comments: Moves all 4 extremities without difficulty.  No midline spinal tenderness to palpation.  Lymphadenopathy:     Cervical: No cervical adenopathy.  Skin:    General: Skin is warm and dry.     Capillary Refill: Capillary refill takes less than 2 seconds.     Comments: Brisk capillary refill.  No rashes or lesions.  No vesicles  Neurological:     General: No focal deficit present.     Mental Status: He is alert.     Cranial Nerves: Cranial nerves are intact.     Sensory: Sensation is intact.     Motor: Motor function is intact.     Coordination: Coordination is intact.     Gait: Gait is intact.     Comments: CN 2-12 grossly intact. Ambulatory without difficulty.    ED Results / Procedures / Treatments   Labs (all labs ordered are listed, but only abnormal results are displayed) Labs Reviewed  BASIC METABOLIC PANEL - Abnormal; Notable for the following components:      Result Value   Glucose, Bld 113 (*)    All other components within normal limits  CBC - Abnormal; Notable for the following components:   MCV 79.9 (*)    All other components within normal limits  LIPASE, BLOOD  HEPATIC FUNCTION PANEL  TROPONIN I (HIGH SENSITIVITY)  TROPONIN I (HIGH SENSITIVITY)    EKG EKG Interpretation  Date/Time:  Wednesday March 24 2019 20:34:59 EST Ventricular Rate:  66 PR Interval:    QRS Duration: 103 QT Interval:  403 QTC Calculation: 423 R Axis:   124 Text Interpretation: Sinus rhythm Left posterior fascicular block Artifact Abnormal ECG Confirmed by Carmin Muskrat [phone]) on 03/24/2019 8:42:09 PM   Radiology DG Chest 2 View  Result Date: 03/24/2019 CLINICAL DATA:  Chest pain EXAM: CHEST - 2 VIEW COMPARISON:  12/13/2017 FINDINGS: Heart and mediastinal contours are within normal limits. No focal  opacities or effusions. No acute bony abnormality. IMPRESSION: No active cardiopulmonary disease. Electronically Signed   By: Rolm Baptise M.D.   On: 03/24/2019 20:12   CT Abdomen Pelvis W Contrast  Result Date: 03/24/2019 CLINICAL DATA:  Epigastric pain. EXAM: CT ABDOMEN AND PELVIS WITH CONTRAST TECHNIQUE: Multidetector CT imaging of the abdomen and pelvis was performed using the standard protocol following bolus administration of intravenous contrast. CONTRAST:  159mL OMNIPAQUE IOHEXOL 300 MG/ML  SOLN COMPARISON:  CT 12/21/2015 FINDINGS: Lower chest: Lung bases are clear. Hepatobiliary: Superior aspect of the hepatic dome is not included in the field of view. No focal hepatic abnormality. Mild hepatic steatosis. Clips in the gallbladder fossa postcholecystectomy. No biliary dilatation. Pancreas: No ductal dilatation or inflammation. Spleen: Normal in size without focal abnormality. Adrenals/Urinary Tract: Normal adrenal glands. No hydronephrosis or perinephric edema. Homogeneous renal enhancement with symmetric excretion on delayed phase imaging. Subcentimeter hypodensity in the right kidney is too small to characterize but likely cyst. Incidental bilateral accessory renal arteries. Urinary bladder is physiologically distended without wall thickening. Stomach/Bowel: Uppermost portion of the stomach is not included in the field of view. No gastric wall thickening or inflammation or visualized. No small bowel obstruction or inflammatory change. Normal appendix. Moderate volume of stool throughout  the entire colon. No colonic wall thickening. No pericolonic inflammation. Small volume of rectal stool. Vascular/Lymphatic: Mild aorta bi-iliac atherosclerosis. No aneurysm. The portal vein is patent. No acute vascular findings. No enlarged lymph nodes in the abdomen or pelvis. Reproductive: Prostate is unremarkable. Other: No free air, free fluid, or intra-abdominal fluid collection. Musculoskeletal: Multilevel  degenerative change in the spine. Prominent Schmorl's node superior endplate of L1 with endplate concavity, unchanged. Hemi transitional lumbosacral anatomy. 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 No acute osseous abnormalities. IMPRESSION: 1. No acute abnormality in the abdomen/pelvis. 2. Mild hepatic steatosis. 3. Moderate colonic stool burden which can be seen with constipation. Aortic Atherosclerosis (ICD10-I70.0). Electronically Signed   By: Keith Rake M.D.   On: 03/24/2019 22:56    Procedures Procedures (including critical care time)  Medications Ordered in ED Medications  sodium chloride flush (NS) 0.9 % injection 3 mL (3 mLs Intravenous Given 03/24/19 2212)  pantoprazole (PROTONIX) injection 40 mg (40 mg Intravenous Given 03/24/19 2211)  alum & mag hydroxide-simeth (MAALOX/MYLANTA) 200-200-20 MG/5ML suspension 30 mL (30 mLs Oral Given 03/24/19 2212)    And  lidocaine (XYLOCAINE) 2 % viscous mouth solution 15 mL (15 mLs Oral Given 03/24/19 2212)  ketorolac (TORADOL) 30 MG/ML injection 30 mg (30 mg Intravenous Given 03/24/19 2205)  iohexol (OMNIPAQUE) 300 MG/ML solution 100 mL (100 mLs Intravenous Contrast Given 03/24/19 2230)    ED Course  I have reviewed the triage vital signs and the nursing notes.  Pertinent labs & imaging results that were available during my care of the patient were reviewed by me and considered in my medical decision making (see chart for details).  63 year old presents for multiple complaints.  Unfortunately patient has longstanding history of chronic abdominal pain, GERD, H. pylori.  Mild abdominal tenderness to epigastric region however negative Murphy sign.  Otherwise nonsurgical abdomen.  Has had some burning up into his chest, chest pain nonexertional, nonpleuritic in nature without associated nausea, vomiting, diaphoresis.  Low suspicion for ACS, PE, dissection.  Has also had a dry scratchy throat over the last month.  Posterior  oropharynx clear.  Mucous membranes moist.  No overlying skin changes to neck, no thyromegaly, masses, no cervical lymphadenopathy. Low suspicion for infectious process, mass as cause of his symptoms.  He has had this multiple times in the past and has been followed by GI and ENT for this previously without formal dx despite EGD. Does have prior history of alcohol use greater than 30 years ago however no esophageal varices on EGD in 2017.  He was followed by [facility] however lost to follow up.  History of constipation, previously thought due to opioid use.  Has lactulose at home which he takes intermittently however not recently. Patient refused rectal exam. Discussed risk vs benefit however patient continues to decline. Declines enema in ED. No current CP, SOB, hemoptysis  Labs and imaging personally reviewed: CBC without leukocytosis, Hgb stable Bmp without significant findings Initial trop negative DG chest negative EKG without STEMI CT AP with constipation. No acute findings.  2250: Patient reassessed. Abd soft, non tender. No surgical abdomen.  Discussed taking his Lactulose at home over the next few days for his constipation which he is in agreement with. Discussed follow with [facility] for his chronic Abd pain.  Care transferred to [facility], [place] who will follow up on Lipase, delta trop and hepatic panel. If WNL can dc home with Carafate, PPI (already sent to pharmacy) and GI follow  up (referal placed.)   Chest pain likely GI related given hx, exam and labs. Low suspicion ACS, PE, dissection, myocarditis, boerhaave, esophageal varices, bacterial infectious process as cause of symptoms. Tolerating PO intake without difficulty.  Patient is nontoxic, nonseptic appearing, in no apparent distress. Patient does not meet the SIRS or Sepsis criteria.  On repeat exam patient does not have a surgical abdomin and there are no peritoneal signs.  No indication of appendicitis, bowel obstruction, bowel  perforation, cholecystitis, diverticulitis. Chest pain is not likely of cardiac or pulmonary etiology d/t presentation, PERC negative, VSS, no tracheal deviation, no JVD or new murmur, RRR, breath sounds equal bilaterally, EKG without acute abnormalities, negative troponin, and negative CXR.         MDM Rules/Calculators/A&P                      Final Clinical Impression(s) / ED Diagnoses Final diagnoses:  Sore throat  Atypical chest pain  Gastroesophageal reflux disease, unspecified whether esophagitis present  Chronic abdominal pain    Rx / DC Orders ED Discharge Orders         Ordered    famotidine (PEPCID) 20 MG tablet  2 times daily     03/24/19 2254    sucralfate (CARAFATE) 1 GM/10ML suspension  3 times daily with meals & bedtime     03/24/19 2254    Ambulatory referral to Gastroenterology     03/24/19 2306           Nettie Elm, PA-C 03/24/19 2348    Carmin Muskrat, MD 03/24/19 2357

## 2019-03-24 NOTE — Discharge Instructions (Addendum)
You have been seen today for abdominal pain. Your evaluation was not suggestive of any emergent condition requiring medical intervention at this time. However, some abdominal problems make take more time to appear. Therefore, it is very important for you to pay attention to any new symptoms or worsening of your current condition.  Please return here or to the Emergency Department immediately should you begin to feel worse in any way or have any of the following symptoms: increasing or different abdominal pain, persistent vomiting, inability to drink fluids, fevers, or shaking chills.   You may use over the counter ibuprofen or acetaminophen as needed.  For a sore throat, over the counter products such as [employer] Peroxyl Mouth Sore Rinse or Chloraseptic Sore Throat Spray may provide some temporary relief. Your rapid strep test was negative today. We have sent your throat swab for culture and will let you know of any positive results.

## 2019-03-24 NOTE — ED Triage Notes (Signed)
Pt presents with ongoing sore throat, generalized abdominal pain , generalized sharp chest pain, and constipation for about a month.

## 2019-03-24 NOTE — ED Notes (Signed)
Patient laying down in car until ready, please call [phone] Thanks!

## 2019-03-24 NOTE — ED Triage Notes (Signed)
Pt reporting pins and needles in the chest, generalized abdominal pain, sore throat (for a month), chronic back pain and constipation (2 weeks). Pt reports he went to UC prior and sent here for further eval.

## 2019-03-25 LAB — HEPATIC FUNCTION PANEL
ALT: 22 U/L (ref 0–44)
AST: 22 U/L (ref 15–41)
Albumin: 4.1 g/dL (ref 3.5–5.0)
Alkaline Phosphatase: 65 U/L (ref 38–126)
Bilirubin, Direct: <0.1 mg/dL (ref 0.0–0.2)
Total Bilirubin: 0.6 mg/dL (ref 0.3–1.2)
Total Protein: 7 g/dL (ref 6.5–8.1)

## 2019-03-25 LAB — LIPASE, BLOOD: Lipase: 37 U/L (ref 11–51)

## 2019-03-25 NOTE — ED Provider Notes (Signed)
I assumed care of patient from previous team, please see their note for full H&P. Briefly patient is here for evaluation of multiple complaints, primarily of which is abdominal pain.  Physical Exam  BP 132/86   Pulse 69   Temp 98 F (36.7 C) (Oral)   Resp (!) 9   SpO2 98%   Physical Exam Vitals reviewed.  Constitutional:      General: He is not in acute distress.    Comments: Resting comfortably on bed watching TV.  Able to answer questions without difficulty.  Pulmonary:     Effort: No respiratory distress.  Neurological:     Mental Status: He is alert.     ED Course/Procedures     Procedures Labs Reviewed  BASIC METABOLIC PANEL - Abnormal; Notable for the following components:      Result Value   Glucose, Bld 113 (*)    All other components within normal limits  CBC - Abnormal; Notable for the following components:   MCV 79.9 (*)    All other components within normal limits  LIPASE, BLOOD  HEPATIC FUNCTION PANEL  TROPONIN I (HIGH SENSITIVITY)  TROPONIN I (HIGH SENSITIVITY)    MDM  Plan is to follow-up on lipase, delta Trope, and hepatic function panel.  If these are normal will discharge with Carafate, PPI and GI follow-up which have already been ordered/placed.  Delta troponin is unchanged/normal.  Lipase is not elevated.  Hepatic function panel is normal without significant transaminitis.  Patient appears to be laying in bed comfortably.  Return precautions were discussed with patient who states their understanding.  At the time of discharge patient denied any unaddressed complaints or concerns.  Patient is agreeable for discharge home.        Lorin Glass, PA-C 03/25/19 0039    Carmin Muskrat, MD 03/26/19 [phone]

## 2019-03-26 LAB — NOVEL CORONAVIRUS, NAA (HOSP ORDER, SEND-OUT TO REF LAB; TAT 18-24 HRS): SARS-CoV-2, NAA: NOT DETECTED

## 2019-03-27 LAB — CULTURE, GROUP A STREP (THRC)

## 2019-03-27 NOTE — ED Provider Notes (Signed)
[place]   [id number] 03/24/19 Arrival Time: 1640  ASSESSMENT & PLAN:  1. Sore throat   2. Generalized abdominal pain   Chronic abdominal pain.  Benign abdominal exam but with multiple complaints. I'm not sure I have much to offer him here today. No indications for urgent abdominal/pelvic imaging at this time. Discussed.  Rapid strep negative. COVID testing sent.   Discharge Instructions     You have been seen today for abdominal pain. Your evaluation was not suggestive of any emergent condition requiring medical intervention at this time. However, some abdominal problems make take more time to appear. Therefore, it is very important for you to pay attention to any new symptoms or worsening of your current condition.  Please return here or to the Emergency Department immediately should you begin to feel worse in any way or have any of the following symptoms: increasing or different abdominal pain, persistent vomiting, inability to drink fluids, fevers, or shaking chills.    You may use over the counter ibuprofen or acetaminophen as needed.   For a sore throat, over the counter products such as [employer] Peroxyl Mouth Sore Rinse or Chloraseptic Sore Throat Spray may provide some temporary relief.  Your rapid strep test was negative today. We have sent your throat swab for culture and will let you know of any positive results.   Follow-up Information    [place].   Specialty: Emergency Medicine Why: If symptoms worsen in any way. Contact information: [address] [id number] [place] [phone]          Reviewed expectations re: course of current medical issues. Questions answered. Outlined signs and symptoms indicating need for more acute intervention. Patient verbalized understanding. After Visit Summary given.   SUBJECTIVE: History from: patient. [name] is a 63 y.o.  male with a long h/o abdominal pain who presents with complaint of intermittent generalized abdominal discomfort. Onset gradual, this episode approx one month ago. Discomfort described as aching; without specific radiation; does not wake him at night. Reports normal flatus. Symptoms are unchanged since beginning. Fever: absent. Aggravating factors: have not been identified. Alleviating factors: have not been identified. Associated symptoms: fatigue. He denies headache, nausea and vomiting. Appetite: normal. PO intake: normal. Ambulatory without assistance. Urinary symptoms: none. Bowel movements: questions if he is constipated. Also reports a sore throat for several weeks. Does have h/o GERD; intermittent. OTC treatment: none.   Past Surgical History:  Procedure Laterality Date  . CARDIAC CATHETERIZATION  2003   No CAD  . EUS N/A 06/23/2014   Procedure: UPPER ENDOSCOPIC ULTRASOUND (EUS) LINEAR;  Surgeon: Milus Banister, MD;  Location: [facility];  Service: Endoscopy;  Laterality: N/A;  . sinus surgery    . TONSILLECTOMY      ROS: As per HPI. All other systems negative.  OBJECTIVE:  Vitals:   03/24/19 1855  BP: (!) 138/98  Pulse: 76  Resp: 16  Temp: 98.2 F (36.8 C)  TempSrc: Oral  SpO2: 100%    General appearance: alert, oriented, no acute distress HEENT: [place]; AT; oropharynx moist Lungs: clear to auscultation bilaterally; unlabored respirations Heart: regular rate and rhythm Abdomen: soft; without distention; reports generalized TTP; normal bowel sounds; without masses or organomegaly; without guarding or rebound tenderness Back: without CVA tenderness; FROM at waist Extremities: without LE edema; symmetrical; without gross deformities Skin: warm and dry Neurologic: normal gait Psychological: alert and cooperative; normal mood and affect  Labs:  Labs Reviewed  NOVEL CORONAVIRUS, NAA (HOSP ORDER, SEND-OUT TO REF LAB; TAT 18-24 HRS)  CULTURE, GROUP A STREP [facility])  POCT  RAPID STREP A     Allergies  Allergen Reactions  . Morphine And Related Shortness Of Breath                                               Past Medical History:  Diagnosis Date  . Alcohol abuse   . Allergy   . Bipolar disorder ([place])   . Chronic pain    lower back pain, "that has progressed"  . Depression   . Diverticulosis   . Dyspnea   . Hepatitis C    hx. of"states he no longer has"" is clear".  . History of recreational drug use    "cocaine, marijuana, polysubstance" quit 1987.  Marland Kitchen HLD (hyperlipidemia)   . Hypertension   . Pancreatitis 2015  . Panic attack   . Sleep apnea    no cpap used,still has machine(use rarely)   Social History   Socioeconomic History  . Marital status: Divorced    Spouse name: Not on file  . Number of children: 0  . Years of education: GED  . Highest education level: Not on file  Occupational History  . Occupation: DISABLED  . Occupation:    Tobacco Use  . Smoking status: Former Smoker    Packs/day: 2.00    Years: 16.00    Pack years: 32.00    Types: Cigarettes    Quit date: 04/08/2002    Years since quitting: 16.9  . Smokeless tobacco: Never Used  Substance and Sexual Activity  . Alcohol use: No    Alcohol/week: 0.0 standard drinks    Comment: Quit in 1987  . Drug use: No    Comment: Use to take cocaine, marijuana, per patient everything. Stopped in 1987.  Marland Kitchen Sexual activity: Not Currently    Partners: Female  Other Topics Concern  . Not on file  Social History Narrative   Patient lives at home alone.    Disabled.   Education GED    Both handed.   Caffeine two cups of coffee daily.      Social Determinants of Health   Financial Resource Strain:   . Difficulty of Paying Living Expenses: Not on file  Food Insecurity:   . Worried About Charity fundraiser in the Last Year: Not on file  . Ran Out of Food in the Last Year: Not on file  Transportation Needs:   . Lack of Transportation (Medical): Not on file  . Lack of  Transportation (Non-Medical): Not on file  Physical Activity:   . Days of Exercise per Week: Not on file  . Minutes of Exercise per Session: Not on file  Stress:   . Feeling of Stress : Not on file  Social Connections:   . Frequency of Communication with Friends and Family: Not on file  . Frequency of Social Gatherings with Friends and Family: Not on file  . Attends Religious Services: Not on file  . Active Member of Clubs or Organizations: Not on file  . Attends Archivist Meetings: Not on file  . Marital Status: Not on file  Intimate Partner Violence:   . Fear of Current or Ex-Partner: Not on file  . Emotionally Abused: Not on file  . Physically  Abused: Not on file  . Sexually Abused: Not on file   Family History  Problem Relation Age of Onset  . Heart disease Mother   . Kidney disease Mother   . COPD Mother   . Lupus Mother   . Heart disease Father   . Breast cancer Maternal Grandmother   . Diabetes Maternal Reymundo Poll, MD 03/27/19 1014

## 2019-04-01 ENCOUNTER — Encounter: Payer: Self-pay | Admitting: Gastroenterology

## 2019-04-01 ENCOUNTER — Ambulatory Visit (INDEPENDENT_AMBULATORY_CARE_PROVIDER_SITE_OTHER): Payer: Medicare Other | Admitting: Gastroenterology

## 2019-04-01 VITALS — BP 116/70 | HR 72 | Temp 98.2°F | Ht 65.5 in | Wt 163.5 lb

## 2019-04-01 DIAGNOSIS — J029 Acute pharyngitis, unspecified: Secondary | ICD-10-CM | POA: Diagnosis not present

## 2019-04-01 DIAGNOSIS — R12 Heartburn: Secondary | ICD-10-CM | POA: Diagnosis not present

## 2019-04-01 DIAGNOSIS — G8929 Other chronic pain: Secondary | ICD-10-CM

## 2019-04-01 DIAGNOSIS — R1013 Epigastric pain: Secondary | ICD-10-CM

## 2019-04-01 DIAGNOSIS — K5909 Other constipation: Secondary | ICD-10-CM

## 2019-04-01 NOTE — Progress Notes (Signed)
[facility] Progress Note  Chief Complaint: Sore throat and abdominal pain  Subjective  History: Initially seen office consult May 2018 for proctalgia fugax.(Had previously been seen by Dr. Deatra Ina 2016 and 2017 for chronic abdominal pain) last seen February 2019 for chronic abdominal pain and chronic sore throat.  Extensive ENT evaluation with no clear cause for sore throat seen.  Sent to [place] with concerns for an upper digestive cause of sore throat, which I did not think was the case.  He has chronic generalized abdominal pain in the setting of Polar disorder, chronic use of opioids and Xanax.  He had previously been treated for H. pylori, but he was concerned that it might have been a false negative test because he was on PPI at the time.  I sent for stool antigen in February 2019 which was negative.  He was in the emergency department several times in 2019 for sore throat, and recent visit on 03/24/2019 for epigastric and chest pain and scratchy throat.  Lactulose for constipation.   Barrett was referred back to [place] after recent ED visits.  He has had about 2 months of worsening of his chronic sore throat.  He has chronic sinus congestion and drainage that he attributes to previous nasal injuries and reconstruction from boxing injuries.  He feels that his voice is raspy, but he is able to eat well without food feeling stuck in the neck or chest.  For many years he has had chronic abdominal pain that is often epigastric but can be other locations as well.  Intermittent regurgitation and heartburn as well. He continues to have a chronic generalized pain syndrome. Lastly, he has opioid-induced constipation that he treats with lactulose.  Tends to have a BM about every other day.  ROS: Cardiovascular: Nonexertional chest pain chronic Respiratory: no dyspnea Anxiety.  Reports that whenever he tries to wean down his Xanax, all of his pain worsens. Remainder of systems negative except as  above The patient's Past Medical, Family and Social History were reviewed and are on file in the EMR.  Objective:  Med list reviewed  Current Outpatient Medications:  .  ALPRAZolam (XANAX) 0.5 MG tablet, Take 0.5 mg 3 (three) times daily by mouth., Disp: , Rfl: 4 .  cholecalciferol (VITAMIN D) 1000 UNITS tablet, Take 2,000 Units by mouth daily., Disp: , Rfl:  .  Cyanocobalamin (VITAMIN B-12 PO), Take 1 tablet by mouth daily., Disp: , Rfl:  .  DULoxetine (CYMBALTA) 60 MG capsule, Take 60 mg by mouth daily., Disp: , Rfl:  .  famotidine (PEPCID) 20 MG tablet, Take 1 tablet (20 mg total) by mouth 2 (two) times daily., Disp: 30 tablet, Rfl: 0 .  lactulose (CHRONULAC) 10 GM/15ML solution, Take 30 mLs by mouth as needed., Disp: , Rfl:  .  methocarbamol (ROBAXIN) 750 MG tablet, Take 750 mg by mouth 3 (three) times daily as needed., Disp: , Rfl:  .  mirtazapine (REMERON) 45 MG tablet, Take 22.5 mg by mouth at bedtime. , Disp: , Rfl:  .  Multiple Vitamin (MULTIVITAMIN WITH MINERALS) TABS, Take 1 tablet by mouth daily. , Disp: , Rfl:  .  nitroGLYCERIN (NITROSTAT) 0.3 MG SL tablet, Place 1 tablet (0.3 mg total) under the tongue every 5 (five) minutes as needed for chest pain., Disp: 30 tablet, Rfl: 0 .  ondansetron (ZOFRAN) 4 MG tablet, Take 1 tablet (4 mg total) by mouth every 8 (eight) hours as needed for nausea or vomiting., Disp: 30  tablet, Rfl: 0 .  Oxycodone HCl 10 MG TABS, Take 10 mg by mouth every 4 (four) hours as needed., Disp: , Rfl:  .  propranolol (INDERAL) 60 MG tablet, Take 60 mg by mouth daily. , Disp: , Rfl: 3 .  sucralfate (CARAFATE) 1 GM/10ML suspension, Take 10 mLs (1 g total) by mouth 4 (four) times daily -  with meals and at bedtime., Disp: 420 mL, Rfl: 0 .  tamsulosin (FLOMAX) 0.4 MG CAPS capsule, Take 0.4 mg by mouth daily., Disp: , Rfl:  .  TURMERIC PO, Take 1 tablet by mouth daily., Disp: , Rfl:  No current facility-administered medications for this  visit.  Facility-Administered Medications Ordered in Other Visits:  .  0.9 %  sodium chloride infusion, , Intravenous, Continuous, [place], Amber M, MD, Last Rate: 20 mL/hr at 09/15/12 1635, 500 mL at 09/15/12 1635   Vital signs in last 24 hrs: Vitals:   04/01/19 0925  BP: 116/70  Pulse: 72  Temp: 98.2 F (36.8 C)    Physical Exam  Antalgic gait, able to get on exam table without assistance. Voice raspy  HEENT: sclera anicteric, oral mucosa moist without lesions.  No neck tenderness, fullness or adenopathy  Neck: supple, no thyromegaly, JVD or lymphadenopathy  Cardiac: RRR without murmurs, S1S2 heard, no peripheral edema  Pulm: clear to auscultation bilaterally, normal RR and effort noted  Abdomen: soft, generalized scattered tenderness, with active bowel sounds. No guarding or palpable hepatosplenomegaly.  Skin; warm and dry, no jaundice or rash Multiple points of musculoskeletal tenderness on torso   Radiologic studies:  CLINICAL DATA:  Epigastric pain.   EXAM: CT ABDOMEN AND PELVIS WITH CONTRAST   TECHNIQUE: Multidetector CT imaging of the abdomen and pelvis was performed using the standard protocol following bolus administration of intravenous contrast.   CONTRAST:  149mL OMNIPAQUE IOHEXOL 300 MG/ML  SOLN   COMPARISON:  CT 12/21/2015   FINDINGS: Lower chest: Lung bases are clear.   Hepatobiliary: Superior aspect of the hepatic dome is not included in the field of view. No focal hepatic abnormality. Mild hepatic steatosis. Clips in the gallbladder fossa postcholecystectomy. No biliary dilatation.   Pancreas: No ductal dilatation or inflammation.   Spleen: Normal in size without focal abnormality.   Adrenals/Urinary Tract: Normal adrenal glands. No hydronephrosis or perinephric edema. Homogeneous renal enhancement with symmetric excretion on delayed phase imaging. Subcentimeter hypodensity in the right kidney is too small to characterize but likely  cyst. Incidental bilateral accessory renal arteries. Urinary bladder is physiologically distended without wall thickening.   Stomach/Bowel: Uppermost portion of the stomach is not included in the field of view. No gastric wall thickening or inflammation or visualized. No small bowel obstruction or inflammatory change. Normal appendix. Moderate volume of stool throughout the entire colon. No colonic wall thickening. No pericolonic inflammation. Small volume of rectal stool.   Vascular/Lymphatic: Mild aorta bi-iliac atherosclerosis. No aneurysm. The portal vein is patent. No acute vascular findings. No enlarged lymph nodes in the abdomen or pelvis.   Reproductive: Prostate is unremarkable.   Other: No free air, free fluid, or intra-abdominal fluid collection.   Musculoskeletal: Multilevel degenerative change in the spine. Prominent Schmorl's node superior endplate of L1 with endplate concavity, unchanged. Hemi transitional lumbosacral anatomy. 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 No acute osseous abnormalities.   IMPRESSION: 1. No acute abnormality in the abdomen/pelvis. 2. Mild hepatic steatosis. 3. Moderate colonic stool burden which can be seen with constipation.   Aortic Atherosclerosis (ICD10-I70.0).  Electronically Signed   By: Keith Rake M.D.   On: 03/24/2019 22:56   @ASSESSMENTPLANBEGIN @ Assessment: Encounter Diagnoses  Name Primary?  . Abdominal pain, chronic, epigastric Yes  . Sore throat    Many years of chronic abdominal pain in the setting of a diffuse chronic musculoskeletal pain syndrome on chronic opioids, Xanax and other medicines.  Recent exacerbation of his sore throat.  Its previously been worked up by ENT, not clear why it is worse lately.  He has a distant smoking history.  While he almost certainly has some degree of reflux from probable delayed gastric emptying due to chronic opioid therapy, I doubt this explains his  sore throat.  Plan: We referred him back to his ENT, Dr. Blenda Nicely, and given him contact number for the clinic.  Continue lactulose and/or MiraLAX for opioid-induced constipation.  I would prefer not to give him any additional prescription medicines due to his current polypharmacy.  I believe yield of upper endoscopy would be low in this case.   Total time 25 minutes, over half spent face-to-face with patient in counseling and coordination of care.   Nelida Meuse III CC: Gavin Pound, MD

## 2019-04-01 NOTE — Patient Instructions (Addendum)
If you are age 61 or older, your body mass index should be between 23-30. Your Body mass index is 26.79 kg/m. If this is out of the aforementioned range listed, please consider follow up with your Primary Care Provider.  If you are age 55 or younger, your body mass index should be between 19-25. Your Body mass index is 26.79 kg/m. If this is out of the aformentioned range listed, please consider follow up with your Primary Care Provider.   Please follow up with Dr Blenda Nicely your ENT at [facility]   [phone]  It was a pleasure to see you today!  Dr. Loletha Carrow

## 2019-07-22 ENCOUNTER — Other Ambulatory Visit: Payer: Self-pay

## 2019-07-23 ENCOUNTER — Telehealth (INDEPENDENT_AMBULATORY_CARE_PROVIDER_SITE_OTHER): Payer: Medicare Other | Admitting: Family Medicine

## 2019-07-23 ENCOUNTER — Other Ambulatory Visit: Payer: Self-pay

## 2019-07-23 VITALS — BP 130/90 | Wt 160.0 lb

## 2019-07-23 DIAGNOSIS — K219 Gastro-esophageal reflux disease without esophagitis: Secondary | ICD-10-CM | POA: Diagnosis not present

## 2019-07-23 MED ORDER — FLUTICASONE PROPIONATE 50 MCG/ACT NA SUSP: 2.0000 | Freq: Every day | NASAL | 6 refills | Status: DC

## 2019-07-23 NOTE — Progress Notes (Signed)
[place]

## 2019-07-23 NOTE — Assessment & Plan Note (Signed)
Patient reports chronic burning in his throat.  He has been prescribed medications for reflux in the past but has been afraid to take them because he is worried about how his body will react to them.  Patient reports symptoms are worse when he is lying down and that he has been laying down more recently so the sore throat and burning in his chest have worsened.  Patient reports an episode of chest pain approximately a week ago and that the burning in his chest is not the same as the chest pain.  Over the last 2 days he has been standing more and eating less and the symptoms have mildly improved.  Patient denies any worsening shortness of breath. -Patient was prescribed omeprazole 40 mg daily in October, patient still has full prescription and will start that -Strict return precautions given

## 2019-07-23 NOTE — Progress Notes (Signed)
[place] Telemedicine Visit  Patient consented to have virtual visit and was identified by name and date of birth. Method of visit: Video  Encounter participants: Patient: [name] - located at home Provider: Gifford Shave - located at [facility]  Chief Complaint: Sore Throat   HPI: Sore throat for years but much worse the past month. Hurts to swallow. Also says chest burns. Reports that has been constant for the past month. Says that felt a few sharp chest pains approximately a week ago while exercising but that has not occurred again since that time. Feels like the pain has mildly improved since decreasing PO intake and standing up more over the past day or 2  Has perscription for omprazole, famotidine which has not been taking. Taking sucrafate and decreasing diet to which has helped with sore throat and burning in chest.  ROS: per HPI  Pertinent PMHx: N/A  Exam:  BP 130/90   Wt 160 lb (72.6 kg)   BMI 26.22 kg/m   Respiratory: Speaking in clear sentences, no acute distress  Assessment/Plan:  GERD (gastroesophageal reflux disease) Patient reports chronic burning in his throat.  He has been prescribed medications for reflux in the past but has been afraid to take them because he is worried about how his body will react to them.  Patient reports symptoms are worse when he is lying down and that he has been laying down more recently so the sore throat and burning in his chest have worsened.  Patient reports an episode of chest pain approximately a week ago and that the burning in his chest is not the same as the chest pain.  Over the last 2 days he has been standing more and eating less and the symptoms have mildly improved.  Patient denies any worsening shortness of breath. -Patient was prescribed omeprazole 40 mg daily in October, patient still has full prescription and will start that -Strict return precautions given

## 2019-08-12 ENCOUNTER — Telehealth: Payer: Self-pay

## 2019-08-12 NOTE — Telephone Encounter (Signed)
Patient calls nurse line stating he is continuing to have trouble swallowing. Patient reports he started taking Omeprazole on 4/16. Patient stated he has some relief when he takes it, however not completely. Patient reports he has "googled" which foods to avoid for reflux, and really has significantly cut back. Patient is concerned because he has lost some weight due to "not being able to swallow all the time." Patient stated he feels like there is something stuck in his throat. ED precautions given. Patient scheduled for Monday.

## 2019-08-16 ENCOUNTER — Ambulatory Visit (HOSPITAL_COMMUNITY)
Admission: RE | Admit: 2019-08-16 | Discharge: 2019-08-16 | Disposition: A | Discharge status: Home / Self Care | Payer: Medicare Other | Source: Ambulatory Visit | Attending: Family Medicine | Admitting: Family Medicine

## 2019-08-16 ENCOUNTER — Encounter: Payer: Self-pay | Admitting: Family Medicine

## 2019-08-16 ENCOUNTER — Other Ambulatory Visit: Payer: Self-pay

## 2019-08-16 ENCOUNTER — Ambulatory Visit (INDEPENDENT_AMBULATORY_CARE_PROVIDER_SITE_OTHER): Payer: Medicare Other | Admitting: Family Medicine

## 2019-08-16 ENCOUNTER — Telehealth: Payer: Self-pay

## 2019-08-16 VITALS — BP 120/64 | HR 66 | Ht 66.0 in | Wt 150.0 lb

## 2019-08-16 DIAGNOSIS — I10 Essential (primary) hypertension: Secondary | ICD-10-CM

## 2019-08-16 DIAGNOSIS — F32A Depression, unspecified: Secondary | ICD-10-CM

## 2019-08-16 DIAGNOSIS — R079 Chest pain, unspecified: Secondary | ICD-10-CM

## 2019-08-16 DIAGNOSIS — R1310 Dysphagia, unspecified: Secondary | ICD-10-CM

## 2019-08-16 DIAGNOSIS — F329 Major depressive disorder, single episode, unspecified: Secondary | ICD-10-CM

## 2019-08-16 DIAGNOSIS — K219 Gastro-esophageal reflux disease without esophagitis: Secondary | ICD-10-CM

## 2019-08-16 NOTE — Progress Notes (Addendum)
SUBJECTIVE:   CHIEF COMPLAINT / HPI:   Patient has been weaning himself off of most of his medications after finding a group online which informed him that some of his medications may be causing him to feel the way he does.  He has completely stopped all benzodiazepines and last dose was 04/01/2019.  He has now only taking duloxetine, propranolol, omeprazole, vitamin D and melatonin.  He does state that he also has Remeron at home that he takes periodically.  Trouble swallowing: Patient reports that he has some trouble swallowing.  He states that he has often had to stop eating.  He reports he was started on omeprazole and it got a little bit better but he still feels quite uncomfortable.  He reports that he has had a 15 pound weight loss recently which he did want to lose some weight but not that much.  He states he feels as if he has to clear his throat often.  He also reports some change in his voice.  Patient reports that he stopped smoking 20 years ago but he did smoke 1 to 2 packs/day from age [age].  He reports he is also having some epigastric belly pain with nausea but no vomiting.  He denies any blood in his emesis.  He does report he often has diarrhea but no constipation.  He denies any fevers.  He reports that he was seen by ENT who did not see anything in his throat but his symptoms have persisted.  Chest Pain: Patient reports that he has been working out more recently in order to help control his chronic pain as he is trying to wean himself off of opioids.  He reports that recently he has noticed that he is having some sharp chest pains that happen when he exerts himself.  He has not been seen by a cardiologist recently.  Patient is high risk for CAD given smoking history as well as blood pressure.  Denies any active chest pain here in the office.  Previous work-up showed the following: Patient had chest pain in 2003 but a cardiac catheterization was clean at that time. He was admitted  in 09/2012 for exertional chest pain/dypsnea. His ECG showed TWI in V1-V3 so he was referred for nuclear stress test which was low risk with EF 70%. He saw Dr. Martinique in the office for follow up in 10/2012 and continued to complain of dyspnea. A 2D ECHO was ordered which showed normal LV function, mild LVH and G2DD.  Depression Patient currently following with Leanora Cover at [facility].  Last appointment was in February or March.  Patient to call and schedule an appointment.  He states he has no plans of hurting himself he just often has thoughts that he would be better off dead.  Patient reports that he has been weaning himself off of benzodiazepines.  He last took this medication 04/01/2019.  He reports that he is trying to wean himself off of all of his medications.  He has only taking duloxetine 20 mg/day for his pain as well as propranolol 10 mg/day.  He states he is overall just not feeling well.  He reports that he feels tired many days.  PERTINENT  PMH / PSH: HTN, OSA, h/o hepatitis C, bipolar, chronic pain, HLD  OBJECTIVE:  BP 120/64   Pulse 66   Ht 5\' 6"  (1.676 m)   Wt 150 lb (68 kg)   SpO2 95%   BMI 24.21 kg/m   General:  NAD, pleasant HEENT: normal oropharynx with no erethema Cardiovascular: RRR, no m/r/g, no LE edema Respiratory: CTA BL, normal work of breathing Gastrointestinal: soft, nontender, nondistended, normoactive BS, negative Murphy's Neuro: CN II-XII grossly intact Psych: AOx3, appropriate affect  ASSESSMENT/PLAN:   Depression Patient encouraged to follow-up with his psychiatrist.  Also given resources. Patient has self weaned off of benzodiazepines and is currently trying to stop opioids.  He is trying to do more exercise and self-care.  He does report that his mood is suffering.  Patient may benefit from further treatment.  Trouble swallowing Patient with trouble swallowing, unintentional weight loss of 15 pounds and hoarseness as well as globus sensation.   Minimal improvement with omeprazole.  Patient with history of tobacco use.  ENT previously did not note any concerns however given patient's history believe that he should be further evaluated by GI with a possible EGD to evaluate for other causes of his trouble swallowing. -Urgent referral placed to GI  Chest pain on exertion Patient previously with history of exertional chest pain and was seen by cardiology in 2014.  At that time had an echo which showed normal LV function, mild LVH and G2 DD.  Patient with cardiac cath in 2003 that did not show any signs of CAD.  Patient has recently begun working out more and has been experiencing chest pain during his exercise.  He reports this is a sharp pain.  No current chest pain in office.  Patient denies any dyspnea or lower extremity edema.  Given his history of smoking as well as blood pressure issues patient is at high risk for CAD.   -Will refer urgently to cardiology so that they may obtain either stress testing or catheterization.  -Risk stratification labs obtained with normal TSH, lipid panel with LDL of 74, normal BMP and CBC    Martinique Kaydra Borgen, DO PGY-3, [place]

## 2019-08-16 NOTE — Patient Instructions (Addendum)
Thank you for coming to see me today. It was a pleasure! Today we talked about:   For your trouble swallowing I have placed an urgent referral to a belly doctor or gastroenterology.  You should receive a call from them within the next 1 to 2 weeks.  If you do not hear from them then please call our office and let us know.  For your chest pain that happens with exercise I have placed a referral to cardiology. You should receive a call from them within the next 1 to 2 weeks.  If you do not hear from them then please call our office and let us know.  Please follow-up with your primary care provider in 4 to 6 weeks or sooner as needed.  If you have any questions or concerns, please do not hesitate to call the office at [phone].  Take Care,   Martinique Shirley, DO   Call [facility]--you can get a walk in appointment tomorrow   [address] [place], [place] [address]      [phone]  Therapy and Counseling Resources Most providers on this list will take Medicaid. Patients with commercial insurance or Medicare should contact their insurance company to get a list of in network providers.  [facility]  [address] [place], [place] [address]      [place] [address]  [place], [place] [address] [phone]  [place] [address] [place], [place] [address]       [place] [address] [place], [place]    Jinny Blossom [facility] [address] [place], [place]  [facility]:  [place]. [place]  [facility]:  [place] [address] Loni Muse, [place]  [facility] (under & uninsured) [address] [place] [phone]    [email or web address]    [facility]  [place]     Phone:  [phone]     [place]  [place] [address]      [place], [place] ext [place]: [place], [place], [place]   [place] ([place] therapist) [address] [place] [address]   [place] [address]    [phone]    [facility]   [place],  [place], [place]   [place]  [place]  [facility]  [address] [place], [place]        [phone]  Open Access/Walk In Clinic under & uninsured [place], To schedule an appointment call [phone]- [phone] [address] [place] [phone]):  Fay Records from 8 AM - 3 PM Moving June 1 to [employer] at [facility] [address], [place],  ([place])   [place], [place]: [phone]) 8:30 - 12; 1 - 2:30  Family Service of the [place],  [place], [place]    ([phone]):8:30 - 12; 2 - 3PM  [facility] [place],  [address],  [place]; [phone]):   Mon -  Fri 8 AM - 5 PM  [facility] [place]  MWF 12:30 to 3:00 or call to schedule an appointment  [phone]  Specific Provider options Psychology Today  [email or web address].com/us 1. click on find a therapist  2. enter your zip code 3. left side and select or tailor a therapist for your specific need.   [facility] Provider Directory [email or web address]  (Medicaid)   Follow all drop down to find a provider  [place] or [email or web address] [address], [place], [place] Recovery support and educational   In home counseling [place] Telephone: [phone]  office in [place] [email or web address]   Does not take reg. Medicaid or Medicare private insurance BCCS, [place] health Choice, [facility], [place], [place], [place], [place] Health Choice  24- Hour Availability:  . [place] or 1-9414237472  . Family Service of the [employer] [phone]  [facility] Crisis Service  [phone]   . [place]  [phone] (after hours)  . Therapeutic Alternative/Mobile Crisis   [phone]  . [place] National Suicide Hotline  614-612-0443 ([place])  . Call 911 or go to emergency room  . [employer]  (445)245-9293);  [place] and [employer]   . [facility] ACCESS  949-800-9710); [place], [place], [place], [place], [place], [place], [place]    Substance Abuse Resources:   24/7 hotline to get help with substance abuse: 952-313-5824   [facility] [place]:   Offers inpatient (including detox) and outpatient--Call to see if covered  [phone] [address], [place], [place] [address]    [facility] Offers intensive outpatient and inpatient; detox provided Insurance accepted: medicaid; provides services to uninsured and lower income  [place], [place] [address] [place] outpatient treatment and ambulatory detox [address], [place] [address] [phone]  If in crisis for mental health 24- Hour Availability:  *[place] or 1-9414237472 * Family Service of the [employer] (Domestic Violence, Rape, etc. )(978)[phone] [place] [phone] or [phone] * [place] [phone] only) [phone] (after hours) *Therapeutic Alternative Mobile Crisis Unit [phone] *[place] National Suicide Hotline 501 537 6322 Diamantina Monks)

## 2019-08-16 NOTE — Telephone Encounter (Signed)
Patient calls nurse line requesting refill on omeprazole. Patient states that he has been taking 40 mg daily (2, 20mg  tablets). Patient requesting omeprazole 20 mg be sent to pharmacy so he can cut back to 20 when he is feeling better.   To PCP and today's office visit provider (shirley)  Talbot Grumbling, RN

## 2019-08-17 LAB — CBC
Hematocrit: 42.9 % (ref 37.5–51.0)
Hemoglobin: 14.5 g/dL (ref 13.0–17.7)
MCH: 26.3 pg — ABNORMAL LOW (ref 26.6–33.0)
MCHC: 33.8 g/dL (ref 31.5–35.7)
MCV: 78 fL — ABNORMAL LOW (ref 79–97)
Platelets: 285 10*3/uL (ref 150–450)
RBC: 5.51 x10E6/uL (ref 4.14–5.80)
RDW: 14.1 % (ref 11.6–15.4)
WBC: 6.9 10*3/uL (ref 3.4–10.8)

## 2019-08-17 LAB — BASIC METABOLIC PANEL
BUN/Creatinine Ratio: 9 — ABNORMAL LOW (ref 10–24)
BUN: 8 mg/dL (ref 8–27)
CO2: 23 mmol/L (ref 20–29)
Calcium: 9.7 mg/dL (ref 8.6–10.2)
Chloride: 100 mmol/L (ref 96–106)
Creatinine, Ser: 0.87 mg/dL (ref 0.76–1.27)
GFR calc Af Amer: 107 mL/min/{1.73_m2} (ref >59)
GFR calc non Af Amer: 92 mL/min/{1.73_m2} (ref >59)
Glucose: 101 mg/dL — ABNORMAL HIGH (ref 65–99)
Potassium: 4.5 mmol/L (ref 3.5–5.2)
Sodium: 137 mmol/L (ref 134–144)

## 2019-08-17 LAB — LIPID PANEL
Chol/HDL Ratio: 2.7 ratio (ref 0.0–5.0)
Cholesterol, Total: 142 mg/dL (ref 100–199)
HDL: 53 mg/dL (ref >39)
LDL Chol Calc (NIH): 74 mg/dL (ref 0–99)
Triglycerides: 78 mg/dL (ref 0–149)
VLDL Cholesterol Cal: 15 mg/dL (ref 5–40)

## 2019-08-17 LAB — TSH: TSH: 0.846 u[IU]/mL (ref 0.450–4.500)

## 2019-08-18 DIAGNOSIS — F32A Depression, unspecified: Secondary | ICD-10-CM | POA: Insufficient documentation

## 2019-08-18 MED ORDER — OMEPRAZOLE 20 MG PO CPDR: 20.0000 mg | DELAYED_RELEASE_CAPSULE | Freq: Every day | ORAL | 1 refills | Status: DC

## 2019-08-19 DIAGNOSIS — R1310 Dysphagia, unspecified: Secondary | ICD-10-CM | POA: Insufficient documentation

## 2019-08-19 NOTE — Assessment & Plan Note (Addendum)
Patient with trouble swallowing, unintentional weight loss of 15 pounds and hoarseness as well as globus sensation.  Minimal improvement with omeprazole.  Patient with history of tobacco use.  ENT previously did not note any concerns however given patient's history believe that he should be further evaluated by GI with a possible EGD to evaluate for other causes of his trouble swallowing. -Urgent referral placed to GI

## 2019-08-19 NOTE — Assessment & Plan Note (Addendum)
Patient previously with history of exertional chest pain and was seen by cardiology in 2014.  At that time had an echo which showed normal LV function, mild LVH and G2 DD.  Patient with cardiac cath in 2003 that did not show any signs of CAD.  Patient has recently begun working out more and has been experiencing chest pain during his exercise.  He reports this is a sharp pain.  No current chest pain in office.  Patient denies any dyspnea or lower extremity edema.  Given his history of smoking as well as blood pressure issues patient is at high risk for CAD.   -Will refer urgently to cardiology so that they may obtain either stress testing or catheterization.  -Risk stratification labs obtained with normal TSH, lipid panel with LDL of 74, normal BMP and CBC

## 2019-08-19 NOTE — Addendum Note (Signed)
Addended by: Marcellous Snarski, Martinique J on: 08/19/2019 10:25 AM   Modules accepted: Orders

## 2019-08-19 NOTE — Assessment & Plan Note (Signed)
Patient encouraged to follow-up with his psychiatrist.  Also given resources. Patient has self weaned off of benzodiazepines and is currently trying to stop opioids.  He is trying to do more exercise and self-care.  He does report that his mood is suffering.  Patient may benefit from further treatment.

## 2019-08-24 NOTE — Telephone Encounter (Signed)
Patient LVM on nurse line stating he is still taking (2) 20mg  tablets daily of ompemprazole. Patient reports he is still having a lot of problems with reflux, and therefore, has not cut back to just (1) 20mg  daily. Patient stated he is having a hard time getting into to see Gertie Fey, patient reports Dr. Cristina Gong (most recent referral) is not accepting new patients. Patient is requesting to be referred somewhere else. Patient reports he has seen Dr. Darden Dates, however did not have a good experience and would rather not go back.    Will forward to PCP to make sure he can take 40mg  of opemprazole, or if its important to cut down to 20mg  right now.   Will forward to [place] to have referral rerouted.

## 2019-08-25 NOTE — Telephone Encounter (Signed)
Patient informed and appreciative of the call.

## 2019-09-02 ENCOUNTER — Encounter: Payer: Self-pay | Admitting: Cardiovascular Disease

## 2019-09-02 ENCOUNTER — Encounter: Payer: Self-pay | Admitting: Nurse Practitioner

## 2019-09-02 ENCOUNTER — Ambulatory Visit (INDEPENDENT_AMBULATORY_CARE_PROVIDER_SITE_OTHER): Payer: Medicare Other | Admitting: Cardiovascular Disease

## 2019-09-02 ENCOUNTER — Other Ambulatory Visit: Payer: Self-pay

## 2019-09-02 VITALS — BP 130/86 | HR 74 | Ht 67.0 in | Wt 150.0 lb

## 2019-09-02 DIAGNOSIS — R079 Chest pain, unspecified: Secondary | ICD-10-CM | POA: Diagnosis not present

## 2019-09-02 DIAGNOSIS — R0789 Other chest pain: Secondary | ICD-10-CM

## 2019-09-02 NOTE — Patient Instructions (Signed)
Medication Instructions:  Your physician recommends that you continue on your current medications as directed. Please refer to the Current Medication list given to you today.  *If you need a refill on your cardiac medications before your next appointment, please call your pharmacy*   Lab Work: None Ordered If you have labs (blood work) drawn today and your tests are completely normal, you will receive your results only by: Marland Kitchen MyChart Message (if you have MyChart) OR . A paper copy in the mail If you have any lab test that is abnormal or we need to change your treatment, we will call you to review the results.   Testing/Procedures: Your Pre-procedure COVID-19 Testing will be done on _________ at ____________ [place] at [id number], [place], [place] [address]. Once you arrive at the testing site, stay in the right hand lane, go under the building overhang not the tent. If you are tested under the tent your results may not be back before your procedure. Please be on time for your appointment.  After your swab you will be given a mask to wear and instructed to go home and quarantine/no visitors until after your procedure. If you test positive you will be notified and your procedure will be cancelled.    Your physician has requested that you have an exercise stress myoview. For further information please visit [email or web address]. Please follow instruction sheet, as given.   Follow-Up: At [facility], you and your health needs are our priority.  As part of our continuing mission to provide you with exceptional heart care, we have created designated Provider Care Teams.  These Care Teams include your primary Cardiologist (physician) and Advanced Practice Providers (APPs -  Physician Assistants and Nurse Practitioners) who all work together to provide you with the care you need, when you need it.  We recommend signing up for the patient portal called  "MyChart".  Sign up information is provided on this After Visit Summary.  MyChart is used to connect with patients for Virtual Visits (Telemedicine).  Patients are able to view lab/test results, encounter notes, upcoming appointments, etc.  Non-urgent messages can be sent to your provider as well.   To learn more about what you can do with MyChart, go to [email or web address].    Your next appointment:    As Needed  The format for your next appointment:   Either In Person or Virtual  Provider:   You may see Mertie Moores, MD or one of the following Advanced Practice Providers on your designated Care Team:    Richardson Dopp, PA-C  [place], [place]

## 2019-09-02 NOTE — Progress Notes (Signed)
Cardiology Office Note:    Date:  09/02/2019   ID:  [name], DOB [birth year], MRN [id number]  PCP:  Samuel Floro, DO  Cardiologist:  [name]:  None   Referring MD: Samuel Malay, MD   Chief Complaint  Patient presents with  . Chest Pain    History of Present Illness:    [name] is a 64 y.o. male with a hx of chronic pain, previous chronic opioid use, and chest pain .   We were asked to see him for his chest pain by [name].    He presents with episodes of chest discomfort.  He has had these episodes for the past 6 or so months.  It is described as a sharp chest pain that lasts for about 15 minutes.  He says that it occurs when after he is overdoes it while working out or working in his yard.  It is not specifically related to any specific exercise.  He does does not necessarily have it when he jogs or walks quickly.  The pain is not associated with sweats or worsening shortness of breath.  He has been exercising quite a bit because he has been coming off of chronic pain medications.   Past Medical History:  Diagnosis Date  . Alcohol abuse   . Allergy   . Bipolar disorder ([place])   . Chronic pain    lower back pain, "that has progressed"  . Depression   . Diverticulosis   . Dyspnea   . Hepatitis C    hx. of"states he no longer has"" is clear".  . History of recreational drug use    "cocaine, marijuana, polysubstance" quit 1987.  Marland Kitchen HLD (hyperlipidemia)   . Hypertension   . Pancreatitis 2015  . Panic attack   . Sleep apnea    no cpap used,still has machine(use rarely)    Past Surgical History:  Procedure Laterality Date  . CARDIAC CATHETERIZATION  2003   No CAD  . EUS N/A 06/23/2014   Procedure: UPPER ENDOSCOPIC ULTRASOUND (EUS) LINEAR;  Surgeon: Samuel Banister, MD;  Location: [facility];  Service: Endoscopy;  Laterality: N/A;  . sinus surgery    . TONSILLECTOMY      Current Medications: No outpatient medications have been  marked as taking for the 09/02/19 encounter (Office Visit) with [name], Samuel Cheng, MD.     Allergies:   Morphine and related   Social History   Socioeconomic History  . Marital status: Divorced    Spouse name: Not on file  . Number of children: 0  . Years of education: GED  . Highest education level: Not on file  Occupational History  . Occupation: DISABLED  . Occupation:    Tobacco Use  . Smoking status: Former Smoker    Packs/day: 2.00    Years: 16.00    Pack years: 32.00    Types: Cigarettes    Quit date: 04/08/2002    Years since quitting: 17.4  . Smokeless tobacco: Never Used  Substance and Sexual Activity  . Alcohol use: No    Alcohol/week: 0.0 standard drinks    Comment: Quit in 1987  . Drug use: No    Comment: Use to take cocaine, marijuana, per patient everything. Stopped in 1987.  Marland Kitchen Sexual activity: Not Currently    Partners: Female  Other Topics Concern  . Not on file  Social History Narrative   Patient lives at home alone.    Disabled.   Education  GED    Both handed.   Caffeine two cups of coffee daily.      Social Determinants of Health   Financial Resource Strain:   . Difficulty of Paying Living Expenses:   Food Insecurity:   . Worried About Charity fundraiser in the Last Year:   . Arboriculturist in the Last Year:   Transportation Needs:   . Film/video editor (Medical):   Marland Kitchen Lack of Transportation (Non-Medical):   Physical Activity:   . Days of Exercise per Week:   . Minutes of Exercise per Session:   Stress:   . Feeling of Stress :   Social Connections:   . Frequency of Communication with Friends and Family:   . Frequency of Social Gatherings with Friends and Family:   . Attends Religious Services:   . Active Member of Clubs or Organizations:   . Attends Archivist Meetings:   Marland Kitchen Marital Status:      Family History: The patient's family history includes Breast Bautista in his maternal grandmother; COPD in his mother; Diabetes  in his maternal grandfather; Heart disease in his father and mother; Kidney disease in his mother; Lupus in his mother.  ROS:   Please see the history of present illness.     All other systems reviewed and are negative.  EKGs/Labs/Other Studies Reviewed:    The following studies were reviewed today:    Recent Labs: 03/24/2019: ALT 22 08/16/2019: BUN 8; Creatinine, Ser 0.87; Hemoglobin 14.5; Platelets 285; Potassium 4.5; Sodium 137; TSH 0.846  Recent Lipid Panel    Component Value Date/Time   CHOL 142 08/16/2019 1620   TRIG 78 08/16/2019 1620   HDL 53 08/16/2019 1620   CHOLHDL 2.7 08/16/2019 1620   CHOLHDL 3.5 09/16/2012 0513   VLDL 33 09/16/2012 0513   LDLCALC 74 08/16/2019 1620   LDLDIRECT 33 08/25/2014 1416    Physical Exam:    VS:  BP 130/86   Pulse 74   Ht 5\' 7"  (1.702 m)   Wt 150 lb (68 kg)   SpO2 96%   BMI 23.49 kg/m     Wt Readings from Last 3 Encounters:  09/02/19 150 lb (68 kg)  08/16/19 150 lb (68 kg)  07/23/19 160 lb (72.6 kg)     GEN:  Well nourished, well developed in no acute distress HEENT: Normal NECK: No JVD; No carotid bruits LYMPHATICS: No lymphadenopathy CARDIAC: RRR, no murmurs, rubs, gallops RESPIRATORY:  Clear to auscultation without rales, wheezing or rhonchi  ABDOMEN: Soft, non-tender, non-distended MUSCULOSKELETAL:  No edema; No deformity  SKIN: Warm and dry NEUROLOGIC:  Alert and oriented x 3 PSYCHIATRIC:  Normal affect    EKG:   Sep 02, 2019: Normal sinus rhythm at 75.  No ST or T wave changes.  No changes from previous EKG.  ASSESSMENT:    1. Other chest pain    PLAN:    In order of problems listed above:  1. Chest pain:  [name] presents with some exertional chest pain.  Some characteristics of this chest pain is atypical in that it is a sharp pain.  It is associated with exertion.  He has had a normal heart catheterization many years ago and had a normal stress test approximately 3 years ago.  I would like to repeat a  stress Myoview study.  I will see him back on an as-needed basis if the stress test has some abnormalities or if he has recurrent episodes of  pain.  He will continue to follow-up with his primary medical doctor.   Medication Adjustments/Labs and Tests Ordered: Current medicines are reviewed at length with the patient today.  Concerns regarding medicines are outlined above.  Orders Placed This Encounter  Procedures  . MYOCARDIAL PERFUSION IMAGING   No orders of the defined types were placed in this encounter.   Patient Instructions  Medication Instructions:  Your physician recommends that you continue on your current medications as directed. Please refer to the Current Medication list given to you today.  *If you need a refill on your cardiac medications before your next appointment, please call your pharmacy*   Lab Work: None Ordered If you have labs (blood work) drawn today and your tests are completely normal, you will receive your results only by: Marland Kitchen MyChart Message (if you have MyChart) OR . A paper copy in the mail If you have any lab test that is abnormal or we need to change your treatment, we will call you to review the results.   Testing/Procedures: Your Pre-procedure COVID-19 Testing will be done on _________ at ____________ [facility] at [id number], [place], [place] [address]. Once you arrive at the testing site, stay in the right hand lane, go under the building overhang not the tent. If you are tested under the tent your results may not be back before your procedure. Please be on time for your appointment.  After your swab you will be given a mask to wear and instructed to go home and quarantine/no visitors until after your procedure. If you test positive you will be notified and your procedure will be cancelled.    Your physician has requested that you have an exercise stress myoview. For further information please visit  [email or web address]. Please follow instruction sheet, as given.   Follow-Up: At [facility], you and your health needs are our priority.  As part of our continuing mission to provide you with exceptional heart care, we have created designated Provider Care Teams.  These Care Teams include your primary Cardiologist (physician) and Advanced Practice Providers (APPs -  Physician Assistants and Nurse Practitioners) who all work together to provide you with the care you need, when you need it.  We recommend signing up for the patient portal called "MyChart".  Sign up information is provided on this After Visit Summary.  MyChart is used to connect with patients for Virtual Visits (Telemedicine).  Patients are able to view lab/test results, encounter notes, upcoming appointments, etc.  Non-urgent messages can be sent to your provider as well.   To learn more about what you can do with MyChart, go to [email or web address].    Your next appointment:    As Needed  The format for your next appointment:   Either In Person or Virtual  Provider:   You may see Mertie Moores, MD or one of the following Advanced Practice Providers on your designated Care Team:    Richardson Dopp, PA-C  Robbie Lis, [place]       Signed, Mertie Moores, MD  09/02/2019 2:03 PM    [place]

## 2019-09-03 ENCOUNTER — Other Ambulatory Visit: Payer: Self-pay

## 2019-09-03 DIAGNOSIS — K219 Gastro-esophageal reflux disease without esophagitis: Secondary | ICD-10-CM

## 2019-09-03 MED ORDER — OMEPRAZOLE 20 MG PO CPDR: 40.0000 mg | DELAYED_RELEASE_CAPSULE | Freq: Every day | ORAL | 1 refills | Status: DC

## 2019-09-03 NOTE — Telephone Encounter (Signed)
Patient calls nurse line stating he is out of Omeprazole. Patient stated he wants 20mg s called in, and for now taking 2 to equal 40mg . Patient is hopefull he will be able to go down to just one 20mg  tablet per day, which is why he is requesting (2) 20mg  per day. Will send to provider.

## 2019-09-07 NOTE — Addendum Note (Signed)
Addended by: Georgiann Cocker on: 09/07/2019 09:16 AM   Modules accepted: Orders

## 2019-09-09 ENCOUNTER — Ambulatory Visit: Payer: Medicare Other | Attending: Internal Medicine

## 2019-09-09 DIAGNOSIS — Z23 Encounter for immunization: Secondary | ICD-10-CM

## 2019-09-09 NOTE — Progress Notes (Signed)
   Covid-19 Vaccination Clinic  Name:  [name]    MRN: [id number] DOB: 06-Sep-1955  09/09/2019  [name] was observed post Covid-19 immunization for 30 minutes based on pre-vaccination screening without incident. He was provided with Vaccine Information Sheet and instruction to access the V-Safe system.   [name] was instructed to call 911 with any severe reactions post vaccine: Marland Kitchen Difficulty breathing  . Swelling of face and throat  . A fast heartbeat  . A bad rash all over body  . Dizziness and weakness   Immunizations Administered    Name Date Dose VIS Date Route   Pfizer COVID-19 Vaccine 09/09/2019  9:43 AM 0.3 mL 06/02/2018 Intramuscular   Manufacturer: Dexter   Lot: [id number]   [place]: [id number]

## 2019-09-15 ENCOUNTER — Telehealth: Payer: Self-pay

## 2019-09-15 ENCOUNTER — Ambulatory Visit (HOSPITAL_BASED_OUTPATIENT_CLINIC_OR_DEPARTMENT_OTHER): Payer: Medicare (Managed Care) | Admitting: Family Medicine

## 2019-09-15 ENCOUNTER — Other Ambulatory Visit: Payer: Self-pay

## 2019-09-15 DIAGNOSIS — F32A Depression, unspecified: Secondary | ICD-10-CM

## 2019-09-15 DIAGNOSIS — F329 Major depressive disorder, single episode, unspecified: Secondary | ICD-10-CM | POA: Diagnosis not present

## 2019-09-15 DIAGNOSIS — K219 Gastro-esophageal reflux disease without esophagitis: Secondary | ICD-10-CM | POA: Diagnosis not present

## 2019-09-15 MED ORDER — FAMOTIDINE 40 MG PO TABS: 40.0000 mg | ORAL_TABLET | Freq: Every day | ORAL | 0 refills | Status: DC

## 2019-09-15 MED ORDER — OMEPRAZOLE 40 MG PO CPDR: 40.0000 mg | DELAYED_RELEASE_CAPSULE | Freq: Every day | ORAL | 0 refills | Status: DC

## 2019-09-15 MED ORDER — MIRTAZAPINE 15 MG PO TBDP: 15.0000 mg | ORAL_TABLET | Freq: Every day | ORAL | 0 refills | Status: DC

## 2019-09-15 NOTE — Patient Instructions (Addendum)
It was great meeting you today!  I do think your sore throat and symptoms are consistent with really bad reflux.  I am a little concerned about your 20 pound weight loss over the last 7 months, and your lack of appetite.  I am going to start you on famotidine which is a histamine blocker.  It is 40 mg twice a day.  On top of that I gave you a prescription for omeprazole 40 mg daily.  This is called a proton pump inhibitor and works in a different method.  I believe with both of these medications working you will get relief from your symptoms.  On top of that I checked to see what your referral status is for [facility] gastroenterology.  The number to [facility] gastroenterology is [phone].  I would give them a call to schedule your appointment. we resent the referral today so that there are no problems.

## 2019-09-15 NOTE — Telephone Encounter (Signed)
Patient calls nurse line reporting sore throat. Patient reports onset for the last three months. Patient states that he has a history of acid reflux, however, sore throat has worsened to where he is having significant pain with swallowing. Patient also reports white patches in throat. Denies fever, cough or runny nose.  Precepted with Dr. Andria Frames. Advised that patient could be seen in office. Scheduled with ATC this afternoon.   Forwarding to PCP and ATC provider.   Talbot Grumbling, RN

## 2019-09-15 NOTE — Progress Notes (Signed)
Patient given PHQ2 and 9.   Provider aware.  .Greg Cratty R Johnie Stadel, CMA  

## 2019-09-17 ENCOUNTER — Encounter: Payer: Self-pay | Admitting: Family Medicine

## 2019-09-17 NOTE — Progress Notes (Signed)
° °  CHIEF COMPLAINT / HPI: 64 year old male who presents with 24-month history of sore throat and cough.  Patient states that his throat has been continue with his sore, which has limited his appetite.  States he has only eating very bland foods at this point.  He has lost around 20 pounds since October 2020.  He has been taking Dexilant 60 mg which was given to him as samples by one of the specialist he sees.  He has been taking no other medications.  He has a history significant for GERD and possible H. pylori infection.  He last underwent colonoscopy in 2016, follow-up was recommended in 10 years.  He has not seen GI specialist since then, and has never had an EGD.  He states that his pain has been much worse while laying down and usually improves somewhat when sitting upright.  Patient last seen back in May 2021 at which a referral was placed to [facility] GI patient preference.  He personally called and was told that they are "working on" getting him an appointment.  Also note the patient has a long history of anxiety.  He states that he was taking a "high dose of benzos" but that was discontinued several months ago.  States that since then he has been having some trouble with his anxiety.  PERTINENT  PMH / PSH:    OBJECTIVE: BP 110/70    Pulse 74    Ht 5\' 7"  (1.702 m)    Wt 148 lb (67.1 kg)    SpO2 97%    BMI 23.18 kg/m   Gen: 64 year old Caucasian male, very pleasant, no acute distress HEENT: No erythema, purulent material in posterior pharynx. CV: Regular rate and rhythm, no M/R/G Resp: Lungs clear to auscultation bilaterally Abd: SNTND, BS present, no guarding or organomegaly Neuro: Alert and oriented, Speech clear, No gross deficits   ASSESSMENT / PLAN:  GERD (gastroesophageal reflux disease) Very long history of symptoms of GERD.  Has been on PPI so unable to test for H. pylori at this time.  I stressed the importance of getting him in to see a gastroenterologist as his weight loss along  with his symptoms is concerning.  I resent his referral to [facility] GI, and encouraged patient to continue to call to get an appointment.  As he is almost out of his Dexilant samples, I sent in omeprazole 40 mg daily, and Pepcid 40 mg daily.  Likely will need EGD.  I did discuss return precautions, and ED precautions with him.  Depression Significant history of depression.  PHQ-9 23 which is increased from May, but no active or passive suicidal ideation.  I did encourage the patient to schedule follow-up as soon as possible with PCP for further management.  He has duloxetine 20 mg daily on his medication list, but it is unclear who prescribes this for him.   Guadalupe Dawn, MD [facility]

## 2019-09-17 NOTE — Assessment & Plan Note (Signed)
Significant history of depression.  PHQ-9 23 which is increased from May, but no active or passive suicidal ideation.  I did encourage the patient to schedule follow-up as soon as possible with PCP for further management.  He has duloxetine 20 mg daily on his medication list, but it is unclear who prescribes this for him.

## 2019-09-17 NOTE — Assessment & Plan Note (Addendum)
Very long history of symptoms of GERD.  Has been on PPI so unable to test for H. pylori at this time.  I stressed the importance of getting him in to see a gastroenterologist as his weight loss along with his symptoms is concerning.  I resent his referral to [facility] GI, and encouraged patient to continue to call to get an appointment.  As he is almost out of his Dexilant samples, I sent in omeprazole 40 mg daily, and Pepcid 40 mg daily.  Likely will need EGD.  I did discuss return precautions, and ED precautions with him.

## 2019-09-18 ENCOUNTER — Other Ambulatory Visit: Payer: Self-pay

## 2019-09-18 ENCOUNTER — Encounter (HOSPITAL_COMMUNITY): Payer: Self-pay

## 2019-09-18 ENCOUNTER — Ambulatory Visit (HOSPITAL_COMMUNITY)
Admission: EM | Admit: 2019-09-18 | Discharge: 2019-09-18 | Disposition: A | Discharge status: Home / Self Care | Payer: Medicare (Managed Care)

## 2019-09-18 DIAGNOSIS — R634 Abnormal weight loss: Secondary | ICD-10-CM

## 2019-09-18 DIAGNOSIS — K219 Gastro-esophageal reflux disease without esophagitis: Secondary | ICD-10-CM

## 2019-09-18 NOTE — Discharge Instructions (Signed)
Address & Hours [facility] at [address] [place], [place] [address]  Get Driving Directions  Main: [phone]   Sunday:  Closed  Monday:  8:00 AM - 5:00 PM  Tuesday:  8:00 AM - 5:00 PM  Wednesday:  8:00 AM - 5:00 PM  Thursday:  8:00 AM - 5:00 PM  Friday:  8:00 AM - 5:00 PM  Saturday:  Closed

## 2019-09-18 NOTE — ED Triage Notes (Signed)
C/o burning sensation in throat that gets worse when he lays down.

## 2019-09-19 ENCOUNTER — Encounter (HOSPITAL_COMMUNITY): Payer: Self-pay | Admitting: Emergency Medicine

## 2019-09-19 ENCOUNTER — Emergency Department (HOSPITAL_COMMUNITY)
Admission: EM | Admit: 2019-09-19 | Discharge: 2019-09-19 | Disposition: A | Discharge status: Home / Self Care | Payer: Medicare (Managed Care) | Attending: Emergency Medicine | Admitting: Emergency Medicine

## 2019-09-19 ENCOUNTER — Emergency Department (HOSPITAL_COMMUNITY): Payer: Medicare (Managed Care)

## 2019-09-19 ENCOUNTER — Other Ambulatory Visit: Payer: Self-pay

## 2019-09-19 DIAGNOSIS — Z79899 Other long term (current) drug therapy: Secondary | ICD-10-CM | POA: Diagnosis not present

## 2019-09-19 DIAGNOSIS — I10 Essential (primary) hypertension: Secondary | ICD-10-CM | POA: Insufficient documentation

## 2019-09-19 DIAGNOSIS — Z87891 Personal history of nicotine dependence: Secondary | ICD-10-CM | POA: Insufficient documentation

## 2019-09-19 DIAGNOSIS — R072 Precordial pain: Secondary | ICD-10-CM | POA: Diagnosis not present

## 2019-09-19 DIAGNOSIS — J029 Acute pharyngitis, unspecified: Secondary | ICD-10-CM | POA: Diagnosis not present

## 2019-09-19 DIAGNOSIS — R070 Pain in throat: Secondary | ICD-10-CM | POA: Diagnosis present

## 2019-09-19 LAB — CBC
HCT: 45.3 % (ref 39.0–52.0)
Hemoglobin: 14.8 g/dL (ref 13.0–17.0)
MCH: 26.7 pg (ref 26.0–34.0)
MCHC: 32.7 g/dL (ref 30.0–36.0)
MCV: 81.6 fL (ref 80.0–100.0)
Platelets: 254 10*3/uL (ref 150–400)
RBC: 5.55 MIL/uL (ref 4.22–5.81)
RDW: 13.4 % (ref 11.5–15.5)
WBC: 8.3 10*3/uL (ref 4.0–10.5)
nRBC: 0 % (ref 0.0–0.2)

## 2019-09-19 LAB — BASIC METABOLIC PANEL
Anion gap: 12 (ref 5–15)
BUN: 18 mg/dL (ref 8–23)
CO2: 21 mmol/L — ABNORMAL LOW (ref 22–32)
Calcium: 9.5 mg/dL (ref 8.9–10.3)
Chloride: 107 mmol/L (ref 98–111)
Creatinine, Ser: 0.77 mg/dL (ref 0.61–1.24)
GFR calc Af Amer: >60 mL/min (ref >60)
GFR calc non Af Amer: >60 mL/min (ref >60)
Glucose, Bld: 106 mg/dL — ABNORMAL HIGH (ref 70–99)
Potassium: 3.9 mmol/L (ref 3.5–5.1)
Sodium: 140 mmol/L (ref 135–145)

## 2019-09-19 LAB — TROPONIN I (HIGH SENSITIVITY): Troponin I (High Sensitivity): <2 ng/L (ref <18)

## 2019-09-19 MED ORDER — SODIUM CHLORIDE 0.9% FLUSH: 3.0000 mL | Freq: Once | INTRAVENOUS | Status: DC

## 2019-09-19 NOTE — ED Provider Notes (Signed)
[facility] DEPT Provider Note   CSN: [id number] Arrival date & time: 09/19/19  1504     History Chief Complaint  Patient presents with  . throat pain  . Chest Pain    [name] is a 64 y.o. male with a prolonged history of throat pain presenting to emergency department complaining of sore throat.  The patient reports is been ongoing for several months.  His primary care provider has been trying to get him to see a gastroenterologist.  He did have one appointment in December, states that he is trying to get set up for an EGD but, but this has not been established yet.  He reports that he also saw an ENT doctor several months ago and was told that his throat was "completely normal."  He comes to the ED today complaining of continued feeling of pain and tightness in his throat.  He reports he's had a hoarse voice for a long time, intermittently as well.  Nothing seems to be working for his pain.  He is on PPI as well as Pepcid and has been compliant with that.  Reports he has had some substernal chest pain, which is intermittent and associated with his throat pain.  He denies hx of MI or cardiac stents.  HPI     Past Medical History:  Diagnosis Date  . Alcohol abuse   . Allergy   . Bipolar disorder ([place])   . Chronic pain    lower back pain, "that has progressed"  . Depression   . Diverticulosis   . Dyspnea   . Hepatitis C    hx. of"states he no longer has"" is clear".  . History of recreational drug use    "cocaine, marijuana, polysubstance" quit 1987.  Marland Kitchen HLD (hyperlipidemia)   . Hypertension   . Pancreatitis 2015  . Panic attack   . Sleep apnea    no cpap used,still has machine(use rarely)    Patient Active Problem List   Diagnosis Date Noted  . Trouble swallowing 08/19/2019  . Depression 08/18/2019  . Groin pain, left 05/20/2018  . Abdominal pain, chronic, epigastric 11/30/2014  . Chronic low back pain 10/17/2014  . Disc  degeneration, lumbar 10/17/2014  . HLD (hyperlipidemia) 08/25/2014  . Chest pain on exertion 09/07/2012  . Memory problem 12/25/2011  . GERD (gastroesophageal reflux disease) 12/18/2010  . OSA on CPAP 09/20/2009  . HYPERTENSION, BENIGN - Diet Controlled 10/19/2008  . HEPATITIS C 06/05/2006  . DEPRESSION, MAJOR, RECURRENT 06/05/2006  . BIPOLAR DISORDER 06/05/2006  . Anxiety 06/05/2006  . Hx of Alcohol abuse 06/05/2006    Past Surgical History:  Procedure Laterality Date  . CARDIAC CATHETERIZATION  2003   No CAD  . EUS N/A 06/23/2014   Procedure: UPPER ENDOSCOPIC ULTRASOUND (EUS) LINEAR;  Surgeon: Milus Banister, MD;  Location: [facility];  Service: Endoscopy;  Laterality: N/A;  . sinus surgery    . TONSILLECTOMY         Family History  Problem Relation Age of Onset  . Heart disease Mother   . Kidney disease Mother   . COPD Mother   . Lupus Mother   . Heart disease Father   . Breast cancer Maternal Grandmother   . Diabetes Maternal Grandfather     Social History   Tobacco Use  . Smoking status: Former Smoker    Packs/day: 2.00    Years: 16.00    Pack years: 32.00    Types: Cigarettes  Quit date: 04/08/2002    Years since quitting: 17.4  . Smokeless tobacco: Never Used  Vaping Use  . Vaping Use: Never used  Substance Use Topics  . Alcohol use: No    Alcohol/week: 0.0 standard drinks    Comment: Quit in 1987  . Drug use: No    Comment: Use to take cocaine, marijuana, per patient everything. Stopped in 1987.    Home Medications Prior to Admission medications   Medication Sig Start Date End Date Taking? Authorizing Provider  cholecalciferol (VITAMIN D) 1000 UNITS tablet Take 400 Units by mouth daily.    [provider]  DULoxetine (CYMBALTA) 60 MG capsule Take 20 mg by mouth daily. 03/19/19   [provider]  famotidine (PEPCID) 40 MG tablet Take 1 tablet (40 mg total) by mouth daily. 09/15/19   Guadalupe Dawn, MD  mirtazapine (REMERON  SOL-TAB) 15 MG disintegrating tablet Take 1 tablet (15 mg total) by mouth at bedtime. 09/15/19   Guadalupe Dawn, MD  omeprazole (PRILOSEC) 40 MG capsule Take 1 capsule (40 mg total) by mouth daily. 09/15/19   Guadalupe Dawn, MD  Oxycodone HCl 10 MG TABS Take 10 mg by mouth every 4 (four) hours as needed. 03/19/19   [provider]  propranolol (INDERAL) 60 MG tablet Take 10 mg by mouth daily. 05/09/17   [provider]  TURMERIC PO Take 1 tablet by mouth daily.    [provider]    Allergies    Morphine and related  Review of Systems   Review of Systems  Constitutional: Negative for chills and fever.  HENT: Positive for sore throat and trouble swallowing. Negative for ear pain.   Eyes: Negative for pain and visual disturbance.  Respiratory: Negative for cough and shortness of breath.   Cardiovascular: Positive for chest pain. Negative for palpitations.  Gastrointestinal: Positive for nausea. Negative for abdominal pain and vomiting.  Genitourinary: Negative for dysuria and hematuria.  Musculoskeletal: Negative for arthralgias and myalgias.  Skin: Negative for color change and rash.  Neurological: Negative for syncope and headaches.  Psychiatric/Behavioral: Negative for agitation and confusion.  All other systems reviewed and are negative.   Physical Exam Updated Vital Signs BP (!) 154/97 (BP Location: Right Arm)   Pulse 69   Temp 98.1 F (36.7 C) (Oral)   Resp 18   SpO2 98%   Physical Exam Vitals and nursing note reviewed.  Constitutional:      Appearance: He is well-developed.  HENT:     Head: Normocephalic and atraumatic.     Comments: Posterior oropharyxn clear, no lesions, no swelling of uvula No stridor Voice is clear Eyes:     Conjunctiva/sclera: Conjunctivae normal.  Cardiovascular:     Rate and Rhythm: Normal rate and regular rhythm.     Heart sounds: No murmur heard.   Pulmonary:     Effort: Pulmonary effort is normal. No respiratory  distress.     Breath sounds: Normal breath sounds.  Abdominal:     Palpations: Abdomen is soft.     Tenderness: There is no abdominal tenderness.  Musculoskeletal:     Cervical back: Neck supple.  Skin:    General: Skin is warm and dry.  Neurological:     General: No focal deficit present.     Mental Status: He is alert and oriented to person, place, and time.     ED Results / Procedures / Treatments   Labs (all labs ordered are listed, but only abnormal results are  displayed) Labs Reviewed  BASIC METABOLIC PANEL - Abnormal; Notable for the following components:      Result Value   CO2 21 (*)    Glucose, Bld 106 (*)    All other components within normal limits  CBC  TROPONIN I (HIGH SENSITIVITY)    EKG EKG Interpretation  Date/Time:  Sunday September 19 2019 15:12:40 EDT Ventricular Rate:  81 PR Interval:    QRS Duration: 100 QT Interval:  376 QTC Calculation: 437 R Axis:   58 Text Interpretation: Sinus rhythm Abnormal R-wave progression, late transition 12 Lead; Mason-Likar NO significant change from prior ecg No STEMI Confirmed by Octaviano Glow [phone]) on 09/19/2019 3:32:55 PM   Radiology DG Chest 2 View  Result Date: 09/19/2019 CLINICAL DATA:  Chest pain EXAM: CHEST - 2 VIEW COMPARISON:  03/24/2019 FINDINGS: The heart size and mediastinal contours are within normal limits. Both lungs are clear. The visualized skeletal structures are unremarkable. IMPRESSION: No acute abnormality of the lungs. Electronically Signed   By: Eddie Candle M.D.   On: 09/19/2019 15:32    Procedures Procedures (including critical care time)  Medications Ordered in ED Medications  sodium chloride flush (NS) 0.9 % injection 3 mL (has no administration in time range)    ED Course  I have reviewed the triage vital signs and the nursing notes.  Pertinent labs & imaging results that were available during my care of the patient were reviewed by me and considered in my medical decision making  (see chart for details).  64 year old male presented emergency department complaining of sore throat ongoing for many months.  He presents today with a sense of frustration, stating he said difficulty getting into see a GI doctor due to some personal issues, as well as some interpersonal issues with his former GI physician.  I asked him if I can place a referral for another GI doctor, and he does not want that now, and says that he is in process of getting things worked out with GI.  I do believe he needs an endoscopy as he may have an esophagitis given the location of his symptoms.  Suspect he also has significant reflux, with some aspiration causing intermittent hoarseness.  Much lower suspicion for acute coronary syndrome.  I suspect the chest pain is related to the esophagus and GI system.  His EKG per my interpretation shows normal sinus rhythm with no ST elevations or ischemic changes.  His trop is <2.  Low risk HEART score.  BMP wnl - doubtful of dehydration.  CBC also wnl.  DG chest is wnl - no evidence of mass or infection per my interpretation.  I reviewed his medical chart including his PCP office notes  I encouraged him to follow up with GI and continue his reflux medications.  Plan for discharge.    Final Clinical Impression(s) / ED Diagnoses Final diagnoses:  Sore throat    Rx / DC Orders ED Discharge Orders    None       Kemonte Ullman, Carola Rhine, MD 09/19/19 2325

## 2019-09-19 NOTE — ED Triage Notes (Signed)
Pt reports having issue with throat and not able to swallow for over year, but gotten worse in past couple months to weeks. Lost 20lb in past 6 months. Pt having chest pains but states that he has acid reflux and pains got worse when he came off medications. Reports his anxiety also getting worse.

## 2019-09-19 NOTE — Discharge Instructions (Addendum)
Please follow up with your GI doctor and continue taking your medications as prescribed.  I think you need an endoscopy (camera scope of your stomach).  Your pain seems to be coming from your esophagus and may be related to reflux or esophagitis.

## 2019-09-20 ENCOUNTER — Emergency Department (HOSPITAL_BASED_OUTPATIENT_CLINIC_OR_DEPARTMENT_OTHER)
Admission: EM | Admit: 2019-09-20 | Discharge: 2019-09-20 | Disposition: A | Discharge status: Home / Self Care | Payer: Medicare (Managed Care) | Attending: Emergency Medicine | Admitting: Emergency Medicine

## 2019-09-20 ENCOUNTER — Encounter (HOSPITAL_BASED_OUTPATIENT_CLINIC_OR_DEPARTMENT_OTHER): Payer: Self-pay | Admitting: *Deleted

## 2019-09-20 ENCOUNTER — Other Ambulatory Visit: Payer: Self-pay

## 2019-09-20 DIAGNOSIS — J029 Acute pharyngitis, unspecified: Secondary | ICD-10-CM | POA: Diagnosis present

## 2019-09-20 DIAGNOSIS — Z9861 Coronary angioplasty status: Secondary | ICD-10-CM | POA: Insufficient documentation

## 2019-09-20 DIAGNOSIS — R1310 Dysphagia, unspecified: Secondary | ICD-10-CM | POA: Diagnosis not present

## 2019-09-20 DIAGNOSIS — Z87891 Personal history of nicotine dependence: Secondary | ICD-10-CM | POA: Diagnosis not present

## 2019-09-20 DIAGNOSIS — I10 Essential (primary) hypertension: Secondary | ICD-10-CM | POA: Diagnosis not present

## 2019-09-20 MED ORDER — MAGIC MOUTHWASH W/LIDOCAINE: ORAL | 0 refills | Status: DC

## 2019-09-20 MED ORDER — MAGIC MOUTHWASH
5.0000 mL | Freq: Once | ORAL | Status: DC
  Filled 2019-09-20: qty 5

## 2019-09-20 MED ORDER — LIDOCAINE VISCOUS HCL 2 % MT SOLN
15.0000 mL | Freq: Once | OROMUCOSAL | Status: AC
  Administered 2019-09-20: 15 mL via OROMUCOSAL
  Filled 2019-09-20: qty 15

## 2019-09-20 NOTE — Discharge Instructions (Signed)
Please follow-up with your GI doctor as planned, in the meantime you can try Magic mouthwash as directed and see if this helps with your symptoms, continue taking the acid reducing medicines you have been prescribed.  I have high suspicion that this may be a problem from your esophagus.

## 2019-09-20 NOTE — ED Provider Notes (Signed)
[facility] EMERGENCY DEPARTMENT Provider Note   CSN: [id number] Arrival date & time: 09/20/19  0820     History Chief Complaint  Patient presents with  . Sore Throat    [name] is a 64 y.o. male.  [name] is a 64 y.o. male with a history of hypertension, hyperlipidemia, hepatitis C, anxiety and panic attacks, bipolar disorder, chronic back pain, alcohol abuse, who presents to the emergency department for evaluation of continued throat pain, he was seen in the ED at [facility] long yesterday for the same and has been evaluated multiple times for this complaint.  This problem has been occurring for several months.  He feels like the discomfort in his throat is getting worse.  He complains of a discomfort in his throat described as a tightness.  He reports that he intermittently has a hoarse voice, and reports some discomfort with swallowing.  Reports that he has had some weight loss over the past several months.  He is been placed on Dexilant and Pepcid.  States that he stopped some of his other medications because he felt they may be contributing.  He has been seen by ENT and they did not see any acute problem with the throat and it is suspected that this problem is potentially with his esophagus.  He has been following with a GI doctor and they are working on getting an endoscopy scheduled.  Patient presents with frustration, he came to [place] long yesterday hoping to have an endoscopy performed.  Today he is just looking for some relief from his discomfort.  It does not appear that he has been on Magic mouthwash previously that he can recall.  He expresses anxiety with trying any new medications because he feels that they may make symptoms worse.  He states that he has been doing a lot of reading on the Internet about his symptoms and has become increasingly anxious about trying other things.  He reports that he feels that his throat has been injured and is never going to get  better.        Past Medical History:  Diagnosis Date  . Alcohol abuse   . Allergy   . Bipolar disorder ([place])   . Chronic pain    lower back pain, "that has progressed"  . Depression   . Diverticulosis   . Dyspnea   . Hepatitis C    hx. of"states he no longer has"" is clear".  . History of recreational drug use    "cocaine, marijuana, polysubstance" quit 1987.  Marland Kitchen HLD (hyperlipidemia)   . Hypertension   . Pancreatitis 2015  . Panic attack   . Sleep apnea    no cpap used,still has machine(use rarely)    Patient Active Problem List   Diagnosis Date Noted  . Trouble swallowing 08/19/2019  . Depression 08/18/2019  . Groin pain, left 05/20/2018  . Abdominal pain, chronic, epigastric 11/30/2014  . Chronic low back pain 10/17/2014  . Disc degeneration, lumbar 10/17/2014  . HLD (hyperlipidemia) 08/25/2014  . Chest pain on exertion 09/07/2012  . Memory problem 12/25/2011  . GERD (gastroesophageal reflux disease) 12/18/2010  . OSA on CPAP 09/20/2009  . HYPERTENSION, BENIGN - Diet Controlled 10/19/2008  . HEPATITIS C 06/05/2006  . DEPRESSION, MAJOR, RECURRENT 06/05/2006  . BIPOLAR DISORDER 06/05/2006  . Anxiety 06/05/2006  . Hx of Alcohol abuse 06/05/2006    Past Surgical History:  Procedure Laterality Date  . CARDIAC CATHETERIZATION  2003   No CAD  .  EUS N/A 06/23/2014   Procedure: UPPER ENDOSCOPIC ULTRASOUND (EUS) LINEAR;  Surgeon: Milus Banister, MD;  Location: [facility];  Service: Endoscopy;  Laterality: N/A;  . sinus surgery    . TONSILLECTOMY         Family History  Problem Relation Age of Onset  . Heart disease Mother   . Kidney disease Mother   . COPD Mother   . Lupus Mother   . Heart disease Father   . Breast cancer Maternal Grandmother   . Diabetes Maternal Grandfather     Social History   Tobacco Use  . Smoking status: Former Smoker    Packs/day: 2.00    Years: 16.00    Pack years: 32.00    Types: Cigarettes    Quit date: 04/08/2002     Years since quitting: 17.4  . Smokeless tobacco: Never Used  Vaping Use  . Vaping Use: Never used  Substance Use Topics  . Alcohol use: No    Alcohol/week: 0.0 standard drinks    Comment: Quit in 1987  . Drug use: No    Comment: Use to take cocaine, marijuana, per patient everything. Stopped in 1987.    Home Medications Prior to Admission medications   Medication Sig Start Date End Date Taking? Authorizing Provider  cholecalciferol (VITAMIN D) 1000 UNITS tablet Take 400 Units by mouth daily.    [provider]  DULoxetine (CYMBALTA) 60 MG capsule Take 20 mg by mouth daily. 03/19/19   [provider]  famotidine (PEPCID) 40 MG tablet Take 1 tablet (40 mg total) by mouth daily. 09/15/19   Guadalupe Dawn, MD  magic mouthwash w/lidocaine SOLN Swish and swallow with 5 ml 3 times daily as needed. Benadryl: Maalox: Nystatin: Lidocaine 1: 1: 1: 1 09/20/19   Lynde Ludwig, Audery Amel, PA-C  mirtazapine (REMERON SOL-TAB) 15 MG disintegrating tablet Take 1 tablet (15 mg total) by mouth at bedtime. 09/15/19   Guadalupe Dawn, MD  omeprazole (PRILOSEC) 40 MG capsule Take 1 capsule (40 mg total) by mouth daily. 09/15/19   Guadalupe Dawn, MD  Oxycodone HCl 10 MG TABS Take 10 mg by mouth every 4 (four) hours as needed. 03/19/19   [provider]  propranolol (INDERAL) 60 MG tablet Take 10 mg by mouth daily. 05/09/17   [provider]  TURMERIC PO Take 1 tablet by mouth daily.    [provider]    Allergies    Morphine and related  Review of Systems   Review of Systems  Constitutional: Negative for chills and fever.  HENT: Positive for sore throat.   Respiratory: Negative for cough and shortness of breath.   Gastrointestinal: Negative for abdominal pain, nausea and vomiting.  All other systems reviewed and are negative.   Physical Exam Updated Vital Signs BP (!) 138/94 (BP Location: Right Arm)   Pulse 76   Temp 98.7 F (37.1 C) (Oral)   Resp 20   Ht 5\' 7"   (1.702 m)   Wt 67.1 kg   SpO2 97%   BMI 23.18 kg/m   Physical Exam Vitals and nursing note reviewed.  Constitutional:      General: He is not in acute distress.    Appearance: He is well-developed. He is not diaphoretic.  HENT:     Head: Normocephalic and atraumatic.     Mouth/Throat:     Mouth: Mucous membranes are moist.     Pharynx: Oropharynx is clear.     Comments: Posterior oropharynx is clear with no  erythema, edema or exudates, mucous membranes moist Eyes:     General:        Right eye: No discharge.        Left eye: No discharge.  Cardiovascular:     Rate and Rhythm: Normal rate and regular rhythm.     Heart sounds: Normal heart sounds. No murmur heard.  No friction rub. No gallop.   Pulmonary:     Effort: Pulmonary effort is normal. No respiratory distress.     Breath sounds: Normal breath sounds.     Comments: Respirations equal and unlabored, patient able to speak in full sentences, lungs clear to auscultation bilaterally Abdominal:     General: Bowel sounds are normal. There is no distension.     Palpations: Abdomen is soft. There is no mass.     Tenderness: There is no abdominal tenderness. There is no guarding.  Musculoskeletal:     Cervical back: Neck supple.  Skin:    General: Skin is warm and dry.     Capillary Refill: Capillary refill takes less than 2 seconds.  Neurological:     Mental Status: He is alert and oriented to person, place, and time.     Coordination: Coordination normal.  Psychiatric:        Mood and Affect: Mood normal.        Behavior: Behavior normal.     ED Results / Procedures / Treatments   Labs (all labs ordered are listed, but only abnormal results are displayed) Labs Reviewed - No data to display  EKG None  Radiology DG Chest 2 View  Result Date: 09/19/2019 CLINICAL DATA:  Chest pain EXAM: CHEST - 2 VIEW COMPARISON:  03/24/2019 FINDINGS: The heart size and mediastinal contours are within normal limits. Both lungs are  clear. The visualized skeletal structures are unremarkable. IMPRESSION: No acute abnormality of the lungs. Electronically Signed   By: Eddie Candle M.D.   On: 09/19/2019 15:32    Procedures Procedures (including critical care time)  Medications Ordered in ED Medications  magic mouthwash (5 mLs Oral Not Given 09/20/19 0944)  lidocaine (XYLOCAINE) 2 % viscous mouth solution 15 mL (15 mLs Mouth/Throat Given 09/20/19 0944)    ED Course  I have reviewed the triage vital signs and the nursing notes.  Pertinent labs & imaging results that were available during my care of the patient were reviewed by me and considered in my medical decision making (see chart for details).    MDM Rules/Calculators/A&P                         64 year old male presents with continued sore throat, has been evaluated multiple times for the same, was seen at [facility] ED yesterday.  Discussed with him that we are unable to perform endoscopy here in the emergency department, I have encouraged him to follow-up with your GI doctor to work on getting this scheduled.  He had reassuring cardiac work-up yesterday and today denies any chest pain.  Do suspect that this is an esophageal issue such as esophagitis.  He is on PPI and H2 blocker.  We will also try having patient swish and swallow with Magic mouthwash to treat for other potential forms of esophagitis and provide some relief.  Stressed to patient the importance of close follow-up with his GI doctor.   At this time there does not appear to be any evidence of an acute emergency medical condition and the patient appears  stable for discharge with appropriate outpatient follow up.Diagnosis was discussed with patient who verbalizes understanding and is agreeable to discharge.  Final Clinical Impression(s) / ED Diagnoses Final diagnoses:  Sore throat    Rx / DC Orders ED Discharge Orders         Ordered    magic mouthwash w/lidocaine SOLN     Discontinue  Reprint      09/20/19 0924           Jacqlyn Larsen, PA-C 09/20/19 7308    Virgel Manifold, MD 09/20/19 1003

## 2019-09-20 NOTE — ED Triage Notes (Signed)
Throat pain and burning in chest   20 lbs weight loss in last month  Has been seen several time for same   Diff swallowing

## 2019-09-22 ENCOUNTER — Telehealth (HOSPITAL_COMMUNITY): Payer: Self-pay | Admitting: *Deleted

## 2019-09-22 ENCOUNTER — Encounter (HOSPITAL_COMMUNITY): Payer: Self-pay | Admitting: *Deleted

## 2019-09-22 NOTE — Telephone Encounter (Signed)
Left message on voicemail per DPR in reference to upcoming appointment scheduled on 09/29/2019 at 1045 with detailed instructions given per Myocardial Perfusion Study Information Sheet for the test. LM to arrive 15 minutes early, and that it is imperative to arrive on time for appointment to keep from having the test rescheduled. If you need to cancel or reschedule your appointment, please call the office within 24 hours of your appointment. Failure to do so may result in a cancellation of your appointment, and a $50 no show fee. Phone number given for call back for any questions. Mychart letter sent with instructions.Jazzie Trampe, Ranae Palms

## 2019-09-29 ENCOUNTER — Telehealth (HOSPITAL_COMMUNITY): Payer: Self-pay | Admitting: Cardiovascular Disease

## 2019-09-29 ENCOUNTER — Telehealth: Payer: Self-pay | Admitting: Gastroenterology

## 2019-09-29 ENCOUNTER — Encounter (HOSPITAL_COMMUNITY): Payer: Medicare Other

## 2019-09-29 NOTE — Telephone Encounter (Signed)
Pt scheduled to see Dr. Loletha Carrow 11/19/19@9 :20am. Pt aware of appt.

## 2019-09-29 NOTE — Telephone Encounter (Signed)
He has chronic throat pain, and the last time I saw him in December 2020 we discussed that I doubt it is from GERD.  If he would like to see me to discuss it, then I will do so since his insurance will not allow him to see [facility].  Needs a next-available office follow up visit - I am not directly booking an EGD for him.  - HD

## 2019-09-29 NOTE — Telephone Encounter (Signed)
Pt calling stating he is having trouble swallowing and he would like for Dr. Loletha Carrow to do an EGD as soon as possible. States he switched his care to [facility] and now his insurance will not cover for him to see that practice. Dr. Loletha Carrow please advise.

## 2019-09-29 NOTE — Telephone Encounter (Signed)
Patient calling is having trouble eating please advise

## 2019-09-29 NOTE — Telephone Encounter (Signed)
Patient cancelled Myoview for 09/29/2019 and will call later to reschedule.

## 2019-09-30 ENCOUNTER — Ambulatory Visit: Payer: Medicare Other

## 2019-10-05 ENCOUNTER — Ambulatory Visit (HOSPITAL_COMMUNITY)
Admission: EM | Admit: 2019-10-05 | Discharge: 2019-10-05 | Disposition: A | Discharge status: Home / Self Care | Payer: Medicare (Managed Care) | Attending: Psychiatry | Admitting: Psychiatry

## 2019-10-05 ENCOUNTER — Telehealth (HOSPITAL_COMMUNITY): Payer: Self-pay | Admitting: Psychiatry

## 2019-10-05 ENCOUNTER — Telehealth: Payer: Self-pay

## 2019-10-05 ENCOUNTER — Other Ambulatory Visit: Payer: Self-pay

## 2019-10-05 DIAGNOSIS — F331 Major depressive disorder, recurrent, moderate: Secondary | ICD-10-CM | POA: Diagnosis not present

## 2019-10-05 DIAGNOSIS — F419 Anxiety disorder, unspecified: Secondary | ICD-10-CM | POA: Insufficient documentation

## 2019-10-05 DIAGNOSIS — F411 Generalized anxiety disorder: Secondary | ICD-10-CM

## 2019-10-05 NOTE — ED Provider Notes (Signed)
Behavioral Health Medical Screening Exam  [name] is a 64 y.o. male.  Total Time spent with patient: 20 minutes  Psychiatric Specialty Exam  Presentation  General Appearance:Casual  Eye Contact:Good  Speech:Clear and Coherent  Speech Volume:Normal  Handedness:No data recorded  Mood and Affect  Mood:Irritable;Anxious  Affect:Appropriate;Congruent   Thought Process  Thought Processes:Coherent;Goal Directed  Descriptions of Associations:Intact  Orientation:Full (Time, Place and Person)  Thought Content:Abstract Reasoning;Logical  Hallucinations:None  Ideas of Reference:None  Suicidal Thoughts:No  Homicidal Thoughts:No   Sensorium  Memory:Immediate Good  Judgment:Fair  Insight:Fair   Executive Functions  Concentration:Fair  Attention Span:Fair  [place]   Psychomotor Activity  Psychomotor Activity:Mannerisms   Assets  Assets:Housing   Sleep  Sleep:Fair  Number of hours: No data recorded  Physical Exam: Physical Exam Vitals (patient refused physical exam) reviewed.    ROS Blood pressure 110/85, pulse 76, temperature (!) 97.3 F (36.3 C), temperature source Temporal, resp. rate 18, height 5\' 7"  (1.702 m), weight 67.1 kg, SpO2 96 %. Body mass index is 23.18 kg/m.  Musculoskeletal: Strength & Muscle Tone: within normal limits Gait & Station: normal Patient leans: N/A   Recommendations:  Based on my evaluation the patient does not appear to have an emergency medical condition.Patient to follow up with IOP, crisis and safety planning done  Hampton Abbot, MD 10/05/2019, 4:28 PM

## 2019-10-05 NOTE — BH Assessment (Signed)
Comprehensive Clinical Assessment (CCA) Screening, Triage and Referral Note  10/05/2019 [name] [id number]   Patient is a 64 y.o.male with a history of anxiety, currently untreated, who presents voluntarily to [facility] reporting worsening anxiety that has become unmanageable.  Patient has been prescribed valium for over 30 years and he spent the past year to two years weaning off and focusing on health and fitness.  He has recently developed a throat problem with significant pain/discomfort.  This has caused increased stress/anxiety, as he is trying to determine who he needs to see.  He has presented to urgent care and the ER requesting an endoscopy only to learn that he will need to follow up with a specialist.  This has been frustrating and overwhelming for patient.  He has considered taking valium again, as he states he "didn't know what else to do."  He states he doesn't think he can make it to appointments with specialists that he needs to attend in this current state.    Patient evaluated by Dr. Dwyane Dee and medication history reviewed.  During this evaluation patient stated Remeron was the only other helpful medication.  Dr. Dwyane Dee has recommended IOP for support with tapering off duloxetine to initiate Remeron.  Patient agrees with this plan and appreciates the recommendation.     Visit Diagnosis: No diagnosis found.  Patient Reported Information How did you hear about [place]? Self   Referral name: No data recorded  Referral phone number: No data recorded Whom do you see for routine medical problems? No data recorded  Practice/Facility Name: No data recorded  Practice/Facility Phone Number: No data recorded  Name of Contact: No data recorded  Contact Number: No data recorded  Contact Fax Number: No data recorded  Prescriber Name: No data recorded  Prescriber Address (if known): No data recorded What Is the Reason for Your Visit/Call Today? Worsening anxiety and  concerns about medications recommended.  How Long Has This Been Causing You Problems? 1 wk - 1 month  Have You Recently Been in Any Inpatient Treatment (Hospital/Detox/Crisis Center/28-Day Program)? No   Name/Location of Program/Hospital:No data recorded  How Long Were You There? No data recorded  When Were You Discharged? No data recorded Have You Ever Received Services From [facility] Before? No   Who Do You See at [facility]? No data recorded Have You Recently Had Any Thoughts About Hurting Yourself? No   Are You Planning to Commit Suicide/Harm Yourself At This time?  No  Have you Recently Had Thoughts About [place]? No   Explanation: No data recorded Have You Used Any Alcohol or Drugs in the Past 24 Hours? No   How Long Ago Did You Use Drugs or Alcohol?  No data recorded  What Did You Use and How Much? No data recorded What Do You Feel Would Help You the Most Today? Medication;Therapy  Do You Currently Have a Therapist/Psychiatrist? No   Name of Therapist/Psychiatrist: No data recorded  Have You Been Recently Discharged From Any Office Practice or Programs? No   Explanation of Discharge From Practice/Program:  No data recorded    CCA Screening Triage Referral Assessment Type of Contact: Face-to-Face   Is this Initial or Reassessment? No data recorded  Date Telepsych consult ordered in CHL:  No data recorded  Time Telepsych consult ordered in CHL:  No data recorded Patient Reported Information Reviewed? Yes   Patient Left Without Being Seen? No data recorded  Reason for Not Completing Assessment:  No data recorded Collateral Involvement: N/A  Does Patient Have a Stage manager Guardian? No data recorded  Name and Contact of Legal Guardian:  No data recorded If Minor and Not Living with Parent(s), Who has Custody? No data recorded Is CPS involved or ever been involved? Never  Is APS involved or ever been involved? Never  Patient Determined  To Be At Risk for Harm To Self or Others Based on Review of Patient Reported Information or Presenting Complaint? No   Method: No data recorded  Availability of Means: No data recorded  Intent: No data recorded  Notification Required: No data recorded  Additional Information for Danger to Others Potential:  No data recorded  Additional Comments for Danger to Others Potential:  No data recorded  Are There Guns or Other Weapons in Your Home?  No data recorded   Types of Guns/Weapons: No data recorded   Are These Weapons Safely Secured?                              No data recorded   Who Could Verify You Are Able To Have These Secured:    No data recorded Do You Have any Outstanding Charges, Pending Court Dates, Parole/Probation? No data recorded Contacted To Inform of Risk of Harm To Self or Others: No data recorded Location of Assessment: [facility] Assessment Services  Does Patient Present under Involuntary Commitment? No   IVC Papers Initial File Date: No data recorded  [place] of Residence: [place]  Patient Currently Receiving the Following Services: No data recorded  Determination of Need: Urgent (48 hours)   Options For Referral: Intensive Outpatient Therapy   Fransico Meadow

## 2019-10-05 NOTE — Telephone Encounter (Signed)
Patient calls nurse line requesting appointment for today to discuss worsening anxiety. Patient states that he has not been taking anxiety medications for awhile now. Patient is not currently suicidal or having thoughts of self harm.   Patient scheduled for follow up with PCP on Thursday.   Advised patient to go to behavioral health hospital if symptoms worsened.   To PCP  Talbot Grumbling, RN

## 2019-10-05 NOTE — Discharge Instructions (Signed)
You have been referred to [facility] Intensive Outpatient Program (IOP).  [name] will reach out to you to discuss the program and get you scheduled.    Call [phone] if you don't hear from [facility] by 10 tomorrow morning.

## 2019-10-05 NOTE — Telephone Encounter (Signed)
D:  Levada Dy (TTS) and Dr. Dwyane Dee referred pt to [facility].  A:  Placed call to orient patient and provide him with a start date, but there was no answer and no voicemail set up.  Will attempt again tomorrow.  Inform Levada Dy.

## 2019-10-05 NOTE — ED Notes (Signed)
Patient belongings in locker 29 

## 2019-10-06 ENCOUNTER — Telehealth (HOSPITAL_COMMUNITY): Payer: Self-pay | Admitting: Psychiatry

## 2019-10-07 ENCOUNTER — Ambulatory Visit: Payer: Medicare (Managed Care) | Admitting: Family Medicine

## 2019-10-13 ENCOUNTER — Other Ambulatory Visit: Payer: Self-pay

## 2019-10-13 ENCOUNTER — Ambulatory Visit (INDEPENDENT_AMBULATORY_CARE_PROVIDER_SITE_OTHER): Payer: Medicare (Managed Care) | Admitting: Psychiatry

## 2019-10-13 DIAGNOSIS — F411 Generalized anxiety disorder: Secondary | ICD-10-CM

## 2019-10-13 MED ORDER — BUSPIRONE HCL 30 MG PO TABS: 30.0000 mg | ORAL_TABLET | Freq: Every day | ORAL | 1 refills | Status: DC

## 2019-10-13 MED ORDER — MIRTAZAPINE 15 MG PO TBDP: 15.0000 mg | ORAL_TABLET | Freq: Every day | ORAL | 0 refills | Status: DC

## 2019-10-13 NOTE — Progress Notes (Signed)
Psychiatric Initial Adult Assessment   Patient Identification: [name] MRN:  [id number] Date of Evaluation:  10/13/2019 Referral Source: [place] Chief Complaint:  "Im not going to make it. Benzos are the only thing that help" Visit Diagnosis:    ICD-10-CM   1. Generalized anxiety disorder  F41.1 mirtazapine (REMERON SOL-TAB) 15 MG disintegrating tablet    busPIRone (BUSPAR) 30 MG tablet    History of Present Illness: 64 year old male seen today for initial psychiatric evaluation.  Patient was referred to outpatient psychiatry by [facility].  He has a psychiatric history of Bipolar disorder, depression, anxiety, and panic disorder.  He informed Probation officer that he has not taking medications as prescribed in a while because they have been ineffective. He note that he take Cymbalta partially because he believes it has negative effects after being taken off benzosdiazepines. He notes that he only finds relief from his anxiousness by taking benzodiazepines.    Today he notes that he took diazepam for 48 years and it was recently stopped in December 2020.  He informed Probation officer that he is tapering himself off of Cymbalta 20 mg. He notes that he opens the capsule and take 48 peletes from the capsule.  He informed Probation officer that he has researched this method from [employer].  He notes that individuals on this website are patient who have had difficulty switching to antidepressants from benzos. He notes that this website is reliable because doctors post on it and patients post about similar withdrawal symptoms.  Provider informed patient that although websites may be helpful they may not be current with current or best practice guidelines.  During exam patient had poor eye contact, was sweating, constantly turned back to provider, looked out window, and had a difficulty concentrating.  He remunerating on treating symptoms with benzodiazapines.  He informed Probation officer that he had a past prescription of Valium that he has  been taking to help manage his symptoms however noted that he needed more.  He informed Probation officer that he was told that he could be admitted to the facility to monitor him while he was on benzos.  He notes that he attempted to be admitted into the facility however was too expensive. He was unaware of what facility he attempted to receive treatment from.  At the end of the visit he informed writer that he did not want any medication besides benzodiazepine and Remeron.  The patient was instructed to discontinue the Cymbalta as he was not taking it as prescribed.  He notes that he purchased melatonin over-the-counter and would continue taking it.  He also asked provider to prescribe BuSpar as he notes he [employer]ud[employer] notes that it was effective.  Writer informed patient that BuSpar is effective in managing anxiety informed him that the medication will be prescribed. Potential side effects of medication and risks vs benefits of treatment vs non-treatment were explained and discussed. All questions were answered. Provider asked patient if he would like to receive counseling from outpatient therapist however he notes at this time does not want counseling noting that he has been in counseling for many years and is aware of what it is about.  No other concerns noted at this time.  Associated Signs/Symptoms: Depression Symptoms:  depressed mood, fatigue, difficulty concentrating, anxiety, (Hypo) Manic Symptoms:  Denies Anxiety Symptoms:  Excessive Worry, Psychotic Symptoms:  Paranoia, PTSD Symptoms: NA  Past Psychiatric History: Bipolar disorder, alcohol use disorder, depression and panic attacks  Previous Psychotropic Medications: Triales melatonin, cymbalta, trazodone which he notes was  ineffective. Diazapam, remeron, and buspar  (notes was effective)  Substance Abuse History in the last 12 months:  No.  Consequences of Substance Abuse: NA  Past Medical History:  Past Medical History:  Diagnosis  Date  . Alcohol abuse   . Allergy   . Bipolar disorder ([place])   . Chronic pain    lower back pain, "that has progressed"  . Depression   . Diverticulosis   . Dyspnea   . Hepatitis C    hx. of"states he no longer has"" is clear".  . History of recreational drug use    "cocaine, marijuana, polysubstance" quit 1987.  Marland Kitchen HLD (hyperlipidemia)   . Hypertension   . Pancreatitis 2015  . Panic attack   . Sleep apnea    no cpap used,still has machine(use rarely)    Past Surgical History:  Procedure Laterality Date  . CARDIAC CATHETERIZATION  2003   No CAD  . EUS N/A 06/23/2014   Procedure: UPPER ENDOSCOPIC ULTRASOUND (EUS) LINEAR;  Surgeon: Milus Banister, MD;  Location: [facility] ENDOSCOPY;  Service: Endoscopy;  Laterality: N/A;  . sinus surgery    . TONSILLECTOMY      Family Psychiatric History: Unknown  Family History:  Family History  Problem Relation Age of Onset  . Heart disease Mother   . Kidney disease Mother   . COPD Mother   . Lupus Mother   . Heart disease Father   . Breast cancer Maternal Grandmother   . Diabetes Maternal Grandfather     Social History:   Social History   Socioeconomic History  . Marital status: Divorced    Spouse name: Not on file  . Number of children: 0  . Years of education: GED  . Highest education level: Not on file  Occupational History  . Occupation: DISABLED  . Occupation:    Tobacco Use  . Smoking status: Former Smoker    Packs/day: 2.00    Years: 16.00    Pack years: 32.00    Types: Cigarettes    Quit date: 04/08/2002    Years since quitting: 17.5  . Smokeless tobacco: Never Used  Vaping Use  . Vaping Use: Never used  Substance and Sexual Activity  . Alcohol use: No    Alcohol/week: 0.0 standard drinks    Comment: Quit in 1987  . Drug use: No    Comment: Use to take cocaine, marijuana, per patient everything. Stopped in 1987.  Marland Kitchen Sexual activity: Not Currently    Partners: Female  Other Topics Concern  . Not on file   Social History Narrative   Patient lives at home alone.    Disabled.   Education GED    Both handed.   Caffeine two cups of coffee daily.      Social Determinants of Health   Financial Resource Strain:   . Difficulty of Paying Living Expenses:   Food Insecurity:   . Worried About Charity fundraiser in the Last Year:   . Arboriculturist in the Last Year:   Transportation Needs:   . Film/video editor (Medical):   Marland Kitchen Lack of Transportation (Non-Medical):   Physical Activity:   . Days of Exercise per Week:   . Minutes of Exercise per Session:   Stress:   . Feeling of Stress :   Social Connections:   . Frequency of Communication with Friends and Family:   . Frequency of Social Gatherings with Friends and Family:   . Attends Religious  Services:   . Active Member of Clubs or Organizations:   . Attends Archivist Meetings:   Marland Kitchen Marital Status:     Additional Social History: Patient resides in [place].  He is currently unemployed and attempting to get disability.  He denies alcohol or illicit drug use. Allergies:   Allergies  Allergen Reactions  . Morphine And Related Shortness Of Breath    Metabolic Disorder Labs: Lab Results  Component Value Date   HGBA1C 5.1 05/26/2015   MPG 111 09/15/2012   No results found for: PROLACTIN Lab Results  Component Value Date   CHOL 142 08/16/2019   TRIG 78 08/16/2019   HDL 53 08/16/2019   CHOLHDL 2.7 08/16/2019   VLDL 33 09/16/2012   LDLCALC 74 08/16/2019   LDLCALC 96 07/08/2017   Lab Results  Component Value Date   TSH 0.846 08/16/2019    Therapeutic Level Labs: No results found for: LITHIUM No results found for: CBMZ No results found for: VALPROATE  Current Medications: Current Outpatient Medications  Medication Sig Dispense Refill  . busPIRone (BUSPAR) 30 MG tablet Take 1 tablet (30 mg total) by mouth daily. 30 tablet 1  . cholecalciferol (VITAMIN D) 1000 UNITS tablet Take 400 Units by mouth at  bedtime.     . famotidine (PEPCID) 40 MG tablet Take 1 tablet (40 mg total) by mouth daily. (Patient not taking: Reported on 10/05/2019) 90 tablet 0  . magic mouthwash w/lidocaine SOLN Swish and swallow with 5 ml 3 times daily as needed. Benadryl: Maalox: Nystatin: Lidocaine 1: 1: 1: 1 (Patient not taking: Reported on 10/05/2019) 75 mL 0  . MELATONIN PO Take 1 tablet by mouth at bedtime as needed (sleep).    . mirtazapine (REMERON SOL-TAB) 15 MG disintegrating tablet Take 1 tablet (15 mg total) by mouth at bedtime. 90 tablet 0  . omeprazole (PRILOSEC) 40 MG capsule Take 1 capsule (40 mg total) by mouth daily. (Patient not taking: Reported on 10/05/2019) 90 capsule 0  . Oxycodone HCl 10 MG TABS Take 10 mg by mouth every 4 (four) hours as needed (pain).     . pantoprazole (PROTONIX) 40 MG tablet Take 40 mg by mouth daily.    . sucralfate (CARAFATE) 1 g tablet Take 1 g by mouth 2 (two) times daily.     No current facility-administered medications for this visit.    Musculoskeletal: Strength & Muscle Tone: within normal limits Gait & Station: normal Patient leans: N/A  Psychiatric Specialty Exam: Review of Systems  There were no vitals taken for this visit.There is no height or weight on file to calculate BMI.  General Appearance: Well Groomed  Eye Contact:  Poor  Speech:  Clear and Coherent and Normal Rate  Volume:  Normal  Mood:  Anxious and Irritable  Affect:  Congruent  Thought Process:  Coherent, Goal Directed and Linear  Orientation:  Full (Time, Place, and Person)  Thought Content:  Logical and Rumination  Suicidal Thoughts:  No  Homicidal Thoughts:  No  Memory:  Immediate;   Good Recent;   Good Remote;   Good  Judgement:  Fair  Insight:  Fair  Psychomotor Activity:  Normal  Concentration:  Concentration: Good and Attention Span: Good  Recall:  Good  Fund of Knowledge:Good  Language: Good  Akathisia:  NA  Handed:  Right  AIMS (if indicated):  Not done  Assets:   Communication Skills Desire for Improvement Financial Resources/Insurance Housing  ADL's:  Intact  Cognition: WNL  Sleep:  Fair   Screenings: Mini-Mental     Office Visit from 02/28/2014 in [facility]  Total Score (max 30 points ) 29    PHQ2-9     Office Visit from 09/15/2019 in [place] Office Visit from 08/16/2019 in [place] Video Visit from 07/23/2019 in [place] Office Visit from 01/15/2019 in [place] Office Visit from 02/06/2018 in [place]  PHQ-2 Total Score 6 2 6  0 1  PHQ-9 Total Score 23 13 -- -- --      Assessment and Plan: Patient endorses symptoms of anxiety.  He is agreeable to discontinuing Cymbalta 20 mg.  He is also agreeable to continuing his Remeron 15 mg, over-the-counter melatonin, and BuSpar 10 grams 3 times a day.  1. Generalized anxiety disorder  Continue- mirtazapine (REMERON SOL-TAB) 15 MG disintegrating tablet; Take 1 tablet (15 mg total) by mouth at bedtime.  Dispense: 90 tablet; Refill: 0 Start- busPIRone (BUSPAR) 30 MG tablet; Take 1 tablet (30 mg total) by mouth daily.  Dispense: 30 tablet; Refill: 1   Follow-up in 1 month   Salley Slaughter, NP 7/7/20216:06 PM

## 2019-10-14 ENCOUNTER — Telehealth (HOSPITAL_COMMUNITY): Payer: Self-pay | Admitting: Cardiovascular Disease

## 2019-10-14 ENCOUNTER — Encounter (HOSPITAL_COMMUNITY): Payer: Self-pay | Admitting: Psychiatry

## 2019-10-14 NOTE — Telephone Encounter (Signed)
I called patient to reschedule the Myocardial Stress test that ghe cancelled and patient did not wish to reschedule at this time. He is having issues getting to several other appointments that he needs and did not want to have at this time. Order will be removed from the [facility] and if patient calls back in the future to schedule we can reinstate the order or create new one.

## 2019-11-19 ENCOUNTER — Ambulatory Visit: Payer: Medicare (Managed Care) | Admitting: Gastroenterology

## 2019-11-23 ENCOUNTER — Encounter (HOSPITAL_COMMUNITY): Payer: Medicare (Managed Care) | Admitting: Psychiatry

## 2019-12-07 ENCOUNTER — Other Ambulatory Visit: Payer: Self-pay | Admitting: Family Medicine

## 2019-12-14 ENCOUNTER — Encounter (HOSPITAL_COMMUNITY): Payer: Self-pay

## 2019-12-14 ENCOUNTER — Other Ambulatory Visit: Payer: Self-pay

## 2019-12-14 ENCOUNTER — Ambulatory Visit (HOSPITAL_COMMUNITY)
Admission: EM | Admit: 2019-12-14 | Discharge: 2019-12-15 | Disposition: A | Discharge status: Home / Self Care | Payer: Medicare (Managed Care) | Attending: Nurse Practitioner | Admitting: Nurse Practitioner

## 2019-12-14 DIAGNOSIS — Z79899 Other long term (current) drug therapy: Secondary | ICD-10-CM | POA: Insufficient documentation

## 2019-12-14 DIAGNOSIS — F411 Generalized anxiety disorder: Secondary | ICD-10-CM

## 2019-12-14 DIAGNOSIS — Z20822 Contact with and (suspected) exposure to covid-19: Secondary | ICD-10-CM | POA: Diagnosis not present

## 2019-12-14 DIAGNOSIS — R45851 Suicidal ideations: Secondary | ICD-10-CM | POA: Insufficient documentation

## 2019-12-14 DIAGNOSIS — Z8619 Personal history of other infectious and parasitic diseases: Secondary | ICD-10-CM | POA: Diagnosis not present

## 2019-12-14 DIAGNOSIS — I10 Essential (primary) hypertension: Secondary | ICD-10-CM | POA: Insufficient documentation

## 2019-12-14 DIAGNOSIS — E785 Hyperlipidemia, unspecified: Secondary | ICD-10-CM | POA: Diagnosis not present

## 2019-12-14 DIAGNOSIS — F332 Major depressive disorder, recurrent severe without psychotic features: Secondary | ICD-10-CM | POA: Insufficient documentation

## 2019-12-14 DIAGNOSIS — G4730 Sleep apnea, unspecified: Secondary | ICD-10-CM | POA: Diagnosis not present

## 2019-12-14 DIAGNOSIS — G8929 Other chronic pain: Secondary | ICD-10-CM | POA: Diagnosis not present

## 2019-12-14 DIAGNOSIS — Z87891 Personal history of nicotine dependence: Secondary | ICD-10-CM | POA: Insufficient documentation

## 2019-12-14 DIAGNOSIS — M545 Low back pain: Secondary | ICD-10-CM | POA: Insufficient documentation

## 2019-12-14 LAB — CBC WITH DIFFERENTIAL/PLATELET
Abs Immature Granulocytes: 0.03 10*3/uL (ref 0.00–0.07)
Basophils Absolute: 0.1 10*3/uL (ref 0.0–0.1)
Basophils Relative: 1 %
Eosinophils Absolute: 0.1 10*3/uL (ref 0.0–0.5)
Eosinophils Relative: 1 %
HCT: 46.6 % (ref 39.0–52.0)
Hemoglobin: 15.4 g/dL (ref 13.0–17.0)
Immature Granulocytes: 0 %
Lymphocytes Relative: 26 %
Lymphs Abs: 2.6 10*3/uL (ref 0.7–4.0)
MCH: 26.1 pg (ref 26.0–34.0)
MCHC: 33 g/dL (ref 30.0–36.0)
MCV: 79 fL — ABNORMAL LOW (ref 80.0–100.0)
Monocytes Absolute: 0.8 10*3/uL (ref 0.1–1.0)
Monocytes Relative: 8 %
Neutro Abs: 6.5 10*3/uL (ref 1.7–7.7)
Neutrophils Relative %: 64 %
Platelets: 272 10*3/uL (ref 150–400)
RBC: 5.9 MIL/uL — ABNORMAL HIGH (ref 4.22–5.81)
RDW: 13.2 % (ref 11.5–15.5)
WBC: 10.1 10*3/uL (ref 4.0–10.5)
nRBC: 0 % (ref 0.0–0.2)

## 2019-12-14 LAB — POCT URINE DRUG SCREEN - MANUAL ENTRY (I-SCREEN)
POC Amphetamine UR: NOT DETECTED
POC Buprenorphine (BUP): NOT DETECTED
POC Cocaine UR: NOT DETECTED
POC Marijuana UR: POSITIVE — AB
POC Methadone UR: NOT DETECTED
POC Methamphetamine UR: NOT DETECTED
POC Morphine: NOT DETECTED
POC Oxazepam (BZO): NOT DETECTED
POC Oxycodone UR: POSITIVE — AB
POC Secobarbital (BAR): NOT DETECTED

## 2019-12-14 LAB — COMPREHENSIVE METABOLIC PANEL
ALT: 25 U/L (ref 0–44)
AST: 27 U/L (ref 15–41)
Albumin: 4.4 g/dL (ref 3.5–5.0)
Alkaline Phosphatase: 81 U/L (ref 38–126)
Anion gap: 10 (ref 5–15)
BUN: 11 mg/dL (ref 8–23)
CO2: 25 mmol/L (ref 22–32)
Calcium: 9.8 mg/dL (ref 8.9–10.3)
Chloride: 104 mmol/L (ref 98–111)
Creatinine, Ser: 0.99 mg/dL (ref 0.61–1.24)
GFR calc Af Amer: >60 mL/min (ref >60)
GFR calc non Af Amer: >60 mL/min (ref >60)
Glucose, Bld: 87 mg/dL (ref 70–99)
Potassium: 3.9 mmol/L (ref 3.5–5.1)
Sodium: 139 mmol/L (ref 135–145)
Total Bilirubin: 0.5 mg/dL (ref 0.3–1.2)
Total Protein: 7.4 g/dL (ref 6.5–8.1)

## 2019-12-14 LAB — POC SARS CORONAVIRUS 2 AG -  ED: SARS Coronavirus 2 Ag: NEGATIVE

## 2019-12-14 LAB — SARS CORONAVIRUS 2 BY RT PCR (HOSPITAL ORDER, PERFORMED IN ~~LOC~~ HOSPITAL LAB): SARS Coronavirus 2: NEGATIVE

## 2019-12-14 LAB — TSH: TSH: 3.362 u[IU]/mL (ref 0.350–4.500)

## 2019-12-14 MED ORDER — MAGNESIUM HYDROXIDE 400 MG/5ML PO SUSP: 30.0000 mL | Freq: Every day | ORAL | Status: DC | PRN

## 2019-12-14 MED ORDER — ACETAMINOPHEN 325 MG PO TABS: 650.0000 mg | ORAL_TABLET | Freq: Four times a day (QID) | ORAL | Status: DC | PRN

## 2019-12-14 MED ORDER — HYDROXYZINE HCL 25 MG PO TABS: 25.0000 mg | ORAL_TABLET | Freq: Three times a day (TID) | ORAL | Status: DC | PRN

## 2019-12-14 MED ORDER — ALUM & MAG HYDROXIDE-SIMETH 200-200-20 MG/5ML PO SUSP: 30.0000 mL | ORAL | Status: DC | PRN

## 2019-12-14 NOTE — BH Assessment (Signed)
Comprehensive Clinical Assessment (CCA) Note  12/14/2019 [name] [id number]   [name] reports to [facility] walk in clinic with suicidal ideation. Pt reports that he does not have a clearly-defined plan.  Pt denies any HI at time of assessment. Pt endorses that he has VH (sees bugs) at times.  Pt has history of benzodiazepine dependence, alcohol use disorder, and current opioid pain medication use. Pt is pacing around the room and seems to be in a highly agitated state of mind. Pt denies any illicit drug or etoh use. Pt reports that he is taking cymbalta, remeron, pain meds and that he feels he is "kindling" which is what he researched on Google as "body feeling like its on fire".  Pt was discharged in the past from psychiatric provider due to medication noncompliance.    Pt has a lot of health-related anxieties including GI issues and overall chronic pain.   Disposition: Per [name] pt meets inpatient criteria  Visit Diagnosis: suicidal ideation; history of bipolar disorder, anxiety, depression   CCA Screening, Triage and Referral (STR)  Patient Reported Information How did you hear about [place]? Self  Referral name: No data recorded Referral phone number: No data recorded  Whom do you see for routine medical problems? Primary Care  Practice/Facility Name: No data recorded Practice/Facility Phone Number: No data recorded Name of Contact: No data recorded Contact Number: No data recorded Contact Fax Number: No data recorded Prescriber Name: Pain clinic; [facility] (has a new doctor that pt has seen once)  Prescriber Address (if known): No data recorded  What Is the Reason for Your Visit/Call Today? Pt reports suicidal ideation and in a highly agitated state. Pt endorses that he took pain meds today "to take the edge off" but now feels like he has burning pain all over his body, which is escalating his overall agitation.  Pt reports that he took valium for 48 years and stopped  in 03/2019 and feels that his current symptoms are withdrawals from him stopping valium at that time. Pt admits a benzo relapse 6 weeks ago but denies any use since then. Pt denies that he has taken any illicit substances and does not drink alcohol.  How Long Has This Been Causing You Problems? <Week  What Do You Feel Would Help You the Most Today? Assessment Only;Therapy;Medication (Pt willing to do what he needs to do to get help)   Have You Recently Been in Any Inpatient Treatment (Hospital/Detox/Crisis Center/28-Day Program)? No  Name/Location of Program/Hospital:No data recorded How Long Were You There? No data recorded When Were You Discharged? No data recorded  Have You Ever Received Services From [facility] Before? Yes  Who Do You See at [facility]? [facility] [name]   Have You Recently Had Any Thoughts About Hurting Yourself? Yes  Are You Planning to Commit Suicide/Harm Yourself At This time? No   Have you Recently Had Thoughts About [place]? No  Explanation: No data recorded  Have You Used Any Alcohol or Drugs in the Past 24 Hours? No  How Long Ago Did You Use Drugs or Alcohol? No data recorded What Did You Use and How Much? No data recorded  Do You Currently Have a Therapist/Psychiatrist? Yes  Name of Therapist/Psychiatrist: Pt reports that he was under the care of a psychiatrist but was discharged from the practice for medication noncompliance   Have You Been Recently Discharged From Any Office Practice or Programs? Yes  Explanation of Discharge  From Practice/Program: psychiatric practice in the past (not recently)     CCA Screening Triage Referral Assessment Type of Contact: Face-to-Face  Is this Initial or Reassessment? No data recorded Date Telepsych consult ordered in CHL:  No data recorded Time Telepsych consult ordered in CHL:  No data recorded  Patient Reported Information Reviewed? Yes  Patient Left Without Being Seen? No  data recorded Reason for Not Completing Assessment: No data recorded  Collateral Involvement: N/A   Does Patient Have a Court Appointed Legal Guardian? No data recorded Name and Contact of Legal Guardian: No data recorded If Minor and Not Living with Parent(s), Who has Custody? No data recorded Is CPS involved or ever been involved? Never  Is APS involved or ever been involved? Never   Patient Determined To Be At Risk for Harm To Self or Others Based on Review of Patient Reported Information or Presenting Complaint? Yes, for Self-Harm  Method: No data recorded Availability of Means: No data recorded Intent: No data recorded Notification Required: No data recorded Additional Information for Danger to Others Potential: No data recorded Additional Comments for Danger to Others Potential: No data recorded Are There Guns or Other Weapons in Your Home? No data recorded Types of Guns/Weapons: No data recorded Are These Weapons Safely Secured?                            No data recorded Who Could Verify You Are Able To Have These Secured: No data recorded Do You Have any Outstanding Charges, Pending Court Dates, Parole/Probation? No data recorded Contacted To Inform of Risk of Harm To Self or Others: No data recorded  Location of Assessment: [facility] Assessment Services   Does Patient Present under Involuntary Commitment? No  IVC Papers Initial File Date: No data recorded  [place] of Residence: [place]   Patient Currently Receiving the Following Services: Medication Management (pain clinic)   Determination of Need: Urgent (48 hours)   Options For Referral: per [name] pt meets inpatient criteria    Recommendations for Services/Supports/Treatments: [facility]    DSM5 Diagnoses: Patient Active Problem List   Diagnosis Date Noted  . Generalized anxiety disorder 10/13/2019  . Trouble swallowing 08/19/2019  . Depression 08/18/2019  . Groin pain, left 05/20/2018  .  Abdominal pain, chronic, epigastric 11/30/2014  . Chronic low back pain 10/17/2014  . Disc degeneration, lumbar 10/17/2014  . HLD (hyperlipidemia) 08/25/2014  . Chest pain on exertion 09/07/2012  . Memory problem 12/25/2011  . GERD (gastroesophageal reflux disease) 12/18/2010  . OSA on CPAP 09/20/2009  . HYPERTENSION, BENIGN - Diet Controlled 10/19/2008  . HEPATITIS C 06/05/2006  . DEPRESSION, MAJOR, RECURRENT 06/05/2006  . BIPOLAR DISORDER 06/05/2006  . Anxiety 06/05/2006  . Hx of Alcohol abuse 06/05/2006    Yatzary Merriweather R Mahati Vajda, LCSW

## 2019-12-14 NOTE — ED Triage Notes (Signed)
Pt arrives via EMS. Pt complaints of feeling on fire, Sensations come & go but last for hours when they happen. C/O anxiety & passive intermittent Si d/t sensations & anxiety.

## 2019-12-14 NOTE — ED Notes (Signed)
Items in locker #3 

## 2019-12-14 NOTE — BH Assessment (Signed)
Called [facility] to see if bed open for pt--AC took information and will call [place] back.

## 2019-12-15 DIAGNOSIS — F411 Generalized anxiety disorder: Secondary | ICD-10-CM | POA: Diagnosis not present

## 2019-12-15 MED ORDER — MIRTAZAPINE 7.5 MG PO TABS
7.5000 mg | ORAL_TABLET | Freq: Every day | ORAL | 0 refills | Status: DC
Start: 2019-12-15 — End: 2020-07-12

## 2019-12-15 MED ORDER — MIRTAZAPINE 7.5 MG PO TABS
7.5000 mg | ORAL_TABLET | Freq: Every day | ORAL | Status: DC
  Administered 2019-12-15: 7.5 mg via ORAL
  Filled 2019-12-15: qty 1

## 2019-12-15 NOTE — ED Notes (Signed)
Patient pacing, very anxious, Patient cooperative

## 2019-12-15 NOTE — ED Notes (Signed)
Patient still very anxious

## 2019-12-15 NOTE — ED Notes (Signed)
Pt pacing on the unit. Pt denies any concerns at this time and verbally contracts for safety. Pt remains safe on the unit.

## 2019-12-15 NOTE — Progress Notes (Addendum)
CSW spoke with pt and provided him with outpatient MH/SU resources. A therapy appointment at [facility] has also been scheduled.  Audree Camel, MSW, LCSW, [place] Clinical Social Worker II Disposition CSW [phone]

## 2019-12-15 NOTE — ED Notes (Addendum)
D: Pt alert and oriented on the unit.   A: Education, support, and encouragement provided. Discharge summary, medications and follow up appointments reviewed with pt. Suicide prevention resources provided. Pt's belongings in locker returned and belongings sheet signed.  R: Pt denies SI/HI, A/VH, pain, or any concerns at this time. Pt ambulatory on and off unit. Pt discharged to lobby to be transported home via [facility].

## 2019-12-15 NOTE — ED Provider Notes (Signed)
Behavioral Health Admission [facility]&P [facility] & OBS)  Date: 12/15/19 Patient Name: [name] MRN: [id number] Chief Complaint:  Chief Complaint  Patient presents with  . Anxiety      Diagnoses:  Final diagnoses:  Generalized anxiety disorder  Severe episode of recurrent major depressive disorder, without psychotic features ([place])    HPI: [name] is a 64 y.o male who presents voluntarily to [facility] with law enforcement due to worsening anxiety, depression, and suicidal thoughts. Patient states "I am tired of dealing with anxiety and withdrawing from valium. I might as well shoot myself." Patient endorses suicidal ideations, but denies having a specific plan. Patient was seen by Eulis Canner, NP at [facility] on 10/13/2019. At that time the patient reported that his medications were ineffective and that he was attempting to wean himself off cymbalta. Patient states that he is continuing to wean off cymbalta, but he has only been able to get down to about a third of a 20 mg cymbalta capsule, he counts the pellets.  He states that he was weaned off valium at the beginning of the year, but about six weeks ago he took around 15 valium over a 5 day period. Patient is prescribed Remeron, he states that he divides it and takes approximately 3.5 mg at night to help him sleep. He was prescribed buspirone in July, but he states that he did not feel it was helpful so he stopped taking it. Patient states that he feels he needs inpatient treatment for stabilization.   On evaluation patient is alert and oriented x 4, pleasant, and cooperative. Patient paces the room during the evaluation. Eye contact is minimal. Speech is clear and coherent, but patient rambles at times. Mood is depressed/anxious and affect is congruent with mood. Thought process is coherent and thought content is logical. Denies audiovisual hallucinations. No indication that patient is responding to internal stimuli. No evidence of delusional thought  content. He reports suicidal ideation without a specific plan, but does mention shooting himself. Denies homicidal ideations.   PHQ 2-9:    Office Visit from 09/15/2019 in [place] Office Visit from 08/16/2019 in [place] Office Visit from 07/26/2016 in [place]  Thoughts that you would be better off dead, or of hurting yourself in some way Not at all More than half the days --  PHQ-9 Total Score _0 Total Time spent with patient: 30 minutes  Musculoskeletal  Strength & Muscle Tone: within normal limits Gait & Station: normal Patient leans: N/A  Psychiatric Specialty Exam  Presentation General Appearance: Appropriate for Environment;Casual;Neat  Eye Contact:Minimal  Speech:Clear and Coherent;Normal Rate  Speech Volume:Decreased  Handedness:Right   Mood and Affect  Mood:Anxious;Depressed;Hopeless;Irritable;Worthless  Affect:Congruent;Depressed   Social worker Processes:Coherent  Descriptions of Associations:Intact  Orientation:Full (Time, Place and Person)  Thought Content:Scattered  Hallucinations:Hallucinations: None  Ideas of Reference:None  Suicidal Thoughts:Suicidal Thoughts: Yes, Active SI Active Intent and/or Plan: With Intent;With Plan  Homicidal Thoughts:Homicidal Thoughts: No   Sensorium  Memory:Immediate Good;Recent Good;Remote Good  Judgment:Impaired  Insight:Lacking   Executive Functions  Concentration:Fair  Attention Span:Fair  [place]   Psychomotor Activity  Psychomotor Activity:Psychomotor Activity: Restlessness   Assets  Assets:Desire for Improvement;Housing;Leisure Time;Physical Health   Sleep  Sleep:Sleep: Fair   Physical Exam Constitutional:      General: He is not in acute distress.    Appearance: He  is not ill-appearing, toxic-appearing or diaphoretic.  HENT:     Head:  Normocephalic.     Right Ear: External ear normal.     Left Ear: External ear normal.  Eyes:     Pupils: Pupils are equal, round, and reactive to light.  Cardiovascular:     Rate and Rhythm: Normal rate.  Pulmonary:     Effort: Pulmonary effort is normal. No respiratory distress.  Musculoskeletal:        General: Normal range of motion.  Skin:    General: Skin is warm and dry.  Neurological:     Mental Status: He is alert and oriented to person, place, and time.  Psychiatric:        Mood and Affect: Mood is anxious and depressed.        Speech: Speech normal.        Behavior: Behavior is cooperative.        Thought Content: Thought content is not paranoid or delusional. Thought content includes suicidal ideation. Thought content does not include homicidal ideation. Thought content does not include suicidal plan.    Review of Systems  Constitutional: Negative for chills, diaphoresis, fever, malaise/fatigue and weight loss.  Respiratory: Negative for cough and shortness of breath.   Cardiovascular: Negative for chest pain.  Gastrointestinal: Negative for diarrhea, nausea and vomiting.  Neurological: Negative for dizziness and seizures.  Psychiatric/Behavioral: Positive for depression and suicidal ideas. Negative for hallucinations. The patient is nervous/anxious and has insomnia.     Blood pressure (!) 120/102, pulse 80, temperature 97.9 F (36.6 C), resp. rate 18, SpO2 97 %. There is no height or weight on file to calculate BMI.  Past Psychiatric History: Bipolar disorder, alcohol use disorder, depression and panic attacks  Is the patient at risk to self? Yes  Has the patient been a risk to self in the past 6 months? No .    Has the patient been a risk to self within the distant past? No   Is the patient a risk to others? No   Has the patient been a risk to others in the past 6 months? No   Has the patient been a risk to others within the distant past? No   Past Medical  History:  Past Medical History:  Diagnosis Date  . Alcohol abuse   . Allergy   . Bipolar disorder ([place])   . Chronic pain    lower back pain, "that has progressed"  . Depression   . Diverticulosis   . Dyspnea   . Hepatitis C    hx. of"states he no longer has"" is clear".  . History of recreational drug use    "cocaine, marijuana, polysubstance" quit 1987.  Marland Kitchen HLD (hyperlipidemia)   . Hypertension   . Pancreatitis 2015  . Panic attack   . Sleep apnea    no cpap used,still has machine(use rarely)    Past Surgical History:  Procedure Laterality Date  . CARDIAC CATHETERIZATION  2003   No CAD  . EUS N/A 06/23/2014   Procedure: UPPER ENDOSCOPIC ULTRASOUND (EUS) LINEAR;  Surgeon: Milus Banister, MD;  Location: [facility];  Service: Endoscopy;  Laterality: N/A;  . sinus surgery    . TONSILLECTOMY      Family History:  Family History  Problem Relation Age of Onset  . Heart disease Mother   . Kidney disease Mother   . COPD Mother   . Lupus Mother   . Heart disease Father   .  Breast cancer Maternal Grandmother   . Diabetes Maternal Grandfather     Social History:  Social History   Socioeconomic History  . Marital status: Divorced    Spouse name: Not on file  . Number of children: 0  . Years of education: GED  . Highest education level: Not on file  Occupational History  . Occupation: DISABLED  . Occupation:    Tobacco Use  . Smoking status: Former Smoker    Packs/day: 2.00    Years: 16.00    Pack years: 32.00    Types: Cigarettes    Quit date: 04/08/2002    Years since quitting: 17.6  . Smokeless tobacco: Never Used  Vaping Use  . Vaping Use: Never used  Substance and Sexual Activity  . Alcohol use: No    Alcohol/week: 0.0 standard drinks    Comment: Quit in 1987  . Drug use: No    Comment: Use to take cocaine, marijuana, per patient everything. Stopped in 1987.  Marland Kitchen Sexual activity: Not Currently    Partners: Female  Other Topics Concern  . Not on file   Social History Narrative   Patient lives at home alone.    Disabled.   Education GED    Both handed.   Caffeine two cups of coffee daily.      Social Determinants of Health   Financial Resource Strain:   . Difficulty of Paying Living Expenses: Not on file  Food Insecurity:   . Worried About Charity fundraiser in the Last Year: Not on file  . Ran Out of Food in the Last Year: Not on file  Transportation Needs:   . Lack of Transportation (Medical): Not on file  . Lack of Transportation (Non-Medical): Not on file  Physical Activity:   . Days of Exercise per Week: Not on file  . Minutes of Exercise per Session: Not on file  Stress:   . Feeling of Stress : Not on file  Social Connections:   . Frequency of Communication with Friends and Family: Not on file  . Frequency of Social Gatherings with Friends and Family: Not on file  . Attends Religious Services: Not on file  . Active Member of Clubs or Organizations: Not on file  . Attends Archivist Meetings: Not on file  . Marital Status: Not on file  Intimate Partner Violence:   . Fear of Current or Ex-Partner: Not on file  . Emotionally Abused: Not on file  . Physically Abused: Not on file  . Sexually Abused: Not on file    SDOH:  SDOH Screenings   Alcohol Screen:   . Last Alcohol Screening Score (AUDIT): Not on file  Depression (PHQ2-9): Medium Risk  . PHQ-2 Score: 23  Financial Resource Strain:   . Difficulty of Paying Living Expenses: Not on file  Food Insecurity:   . Worried About Charity fundraiser in the Last Year: Not on file  . Ran Out of Food in the Last Year: Not on file  Housing:   . Last Housing Risk Score: Not on file  Physical Activity:   . Days of Exercise per Week: Not on file  . Minutes of Exercise per Session: Not on file  Social Connections:   . Frequency of Communication with Friends and Family: Not on file  . Frequency of Social Gatherings with Friends and Family: Not on file  .  Attends Religious Services: Not on file  . Active Member of Clubs or Organizations:  Not on file  . Attends Archivist Meetings: Not on file  . Marital Status: Not on file  Stress:   . Feeling of Stress : Not on file  Tobacco Use: Medium Risk  . Smoking Tobacco Use: Former Smoker  . Smokeless Tobacco Use: Never Used  Transportation Needs:   . Film/video editor (Medical): Not on file  . Lack of Transportation (Non-Medical): Not on file    Last Labs:  Admission on 12/14/2019  Component Date Value Ref Range Status  . SARS Coronavirus 2 12/14/2019 NEGATIVE  NEGATIVE Final   Comment: (NOTE) SARS-CoV-2 target nucleic acids are NOT DETECTED.  The SARS-CoV-2 RNA is generally detectable in upper and lower respiratory specimens during the acute phase of infection. The lowest concentration of SARS-CoV-2 viral copies this assay can detect is 250 copies / mL. A negative result does not preclude SARS-CoV-2 infection and should not be used as the sole basis for treatment or other patient management decisions.  A negative result may occur with improper specimen collection / handling, submission of specimen other than nasopharyngeal swab, presence of viral mutation(s) within the areas targeted by this assay, and inadequate number of viral copies (<250 copies / mL). A negative result must be combined with clinical observations, patient history, and epidemiological information.  Fact Sheet for Patients:   StrictlyIdeas.no  Fact Sheet for Healthcare Providers: BankingDealers.co.za  This test is not yet approved or                           cleared by the [place] FDA and has been authorized for detection and/or diagnosis of SARS-CoV-2 by FDA under an Emergency Use Authorization (EUA).  This EUA will remain in effect (meaning this test can be used) for the duration of the COVID-19 declaration under Section 564(b)(1) of the Act,  21 U.S.C. section 360bbb-3(b)(1), unless the authorization is terminated or revoked sooner.  Performed at [facility], [place] [address], [place], [place] [address]   . WBC 12/14/2019 10.1  4.0 - 10.5 K/uL Final  . RBC 12/14/2019 5.90* 4.22 - 5.81 MIL/uL Final  . Hemoglobin 12/14/2019 15.4  13.0 - 17.0 g/dL Final  . HCT 12/14/2019 46.6  39 - 52 % Final  . MCV 12/14/2019 79.0* 80.0 - 100.0 fL Final  . MCH 12/14/2019 26.1  26.0 - 34.0 pg Final  . MCHC 12/14/2019 33.0  30.0 - 36.0 g/dL Final  . RDW 12/14/2019 13.2  11.5 - 15.5 % Final  . Platelets 12/14/2019 272  150 - 400 K/uL Final  . nRBC 12/14/2019 0.0  0.0 - 0.2 % Final  . Neutrophils Relative % 12/14/2019 64  % Final  . Neutro Abs 12/14/2019 6.5  1.7 - 7.7 K/uL Final  . Lymphocytes Relative 12/14/2019 26  % Final  . Lymphs Abs 12/14/2019 2.6  0.7 - 4.0 K/uL Final  . Monocytes Relative 12/14/2019 8  % Final  . Monocytes Absolute 12/14/2019 0.8  0 - 1 K/uL Final  . Eosinophils Relative 12/14/2019 1  % Final  . Eosinophils Absolute 12/14/2019 0.1  0 - 0 K/uL Final  . Basophils Relative 12/14/2019 1  % Final  . Basophils Absolute 12/14/2019 0.1  0 - 0 K/uL Final  . Immature Granulocytes 12/14/2019 0  % Final  . Abs Immature Granulocytes 12/14/2019 0.03  0.00 - 0.07 K/uL Final   Performed at [facility], [place] [address], [place], [place]  [address]  . Sodium 12/14/2019 139  135 - 145 mmol/L Final  . Potassium 12/14/2019 3.9  3.5 - 5.1 mmol/L Final  . Chloride 12/14/2019 104  98 - 111 mmol/L Final  . CO2 12/14/2019 25  22 - 32 mmol/L Final  . Glucose, Bld 12/14/2019 87  70 - 99 mg/dL Final   Glucose reference range applies only to samples taken after fasting for at least 8 hours.  . BUN 12/14/2019 11  8 - 23 mg/dL Final  . Creatinine, Ser 12/14/2019 0.99  0.61 - 1.24 mg/dL Final  . Calcium 12/14/2019 9.8  8.9 - 10.3 mg/dL Final  . Total Protein 12/14/2019 7.4  6.5 - 8.1 g/dL Final  . Albumin 12/14/2019 4.4  3.5 - 5.0  g/dL Final  . AST 12/14/2019 27  15 - 41 U/L Final  . ALT 12/14/2019 25  0 - 44 U/L Final  . Alkaline Phosphatase 12/14/2019 81  38 - 126 U/L Final  . Total Bilirubin 12/14/2019 0.5  0.3 - 1.2 mg/dL Final  . GFR calc non Af Amer 12/14/2019 >60  >60 mL/min Final  . GFR calc Af Amer 12/14/2019 >60  >60 mL/min Final  . Anion gap 12/14/2019 10  5 - 15 Final   Performed at [facility], [place] [address], [place], [place] [address]  . TSH 12/14/2019 3.362  0.350 - 4.500 uIU/mL Final   Comment: Performed by a 3rd Generation assay with a functional sensitivity of <=0.01 uIU/mL. Performed at [facility], [place] [address], [place], [place] [address]   . POC Amphetamine UR 12/14/2019 None Detected  None Detected Final  . POC Secobarbital (BAR) 12/14/2019 None Detected  None Detected Final  . POC Buprenorphine (BUP) 12/14/2019 None Detected  None Detected Final  . POC Oxazepam (BZO) 12/14/2019 None Detected  None Detected Final  . POC Cocaine UR 12/14/2019 None Detected  None Detected Final  . POC Methamphetamine UR 12/14/2019 None Detected  None Detected Final  . POC Morphine 12/14/2019 None Detected  None Detected Final  . POC Oxycodone UR 12/14/2019 Positive* None Detected Final  . POC Methadone UR 12/14/2019 None Detected  None Detected Final  . POC Marijuana UR 12/14/2019 Positive* None Detected Final  . SARS Coronavirus 2 Ag 12/14/2019 Negative  Negative Preliminary  Admission on 09/19/2019, Discharged on 09/19/2019  Component Date Value Ref Range Status  . Sodium 09/19/2019 140  135 - 145 mmol/L Final  . Potassium 09/19/2019 3.9  3.5 - 5.1 mmol/L Final  . Chloride 09/19/2019 107  98 - 111 mmol/L Final  . CO2 09/19/2019 21* 22 - 32 mmol/L Final  . Glucose, Bld 09/19/2019 106* 70 - 99 mg/dL Final   Glucose reference range applies only to samples taken after fasting for at least 8 hours.  . BUN 09/19/2019 18  8 - 23 mg/dL Final  . Creatinine, Ser 09/19/2019 0.77  0.61 - 1.24 mg/dL  Final  . Calcium 09/19/2019 9.5  8.9 - 10.3 mg/dL Final  . GFR calc non Af Amer 09/19/2019 >60  >60 mL/min Final  . GFR calc Af Amer 09/19/2019 >60  >60 mL/min Final  . Anion gap 09/19/2019 12  5 - 15 Final   Performed at [facility], [place] [address], [place], [place] [address]  . WBC 09/19/2019 8.3  4.0 - 10.5 K/uL Final  . RBC 09/19/2019 5.55  4.22 - 5.81 MIL/uL Final  . Hemoglobin 09/19/2019 14.8  13.0 - 17.0 g/dL Final  .  HCT 09/19/2019 45.3  39 - 52 % Final  . MCV 09/19/2019 81.6  80.0 - 100.0 fL Final  . MCH 09/19/2019 26.7  26.0 - 34.0 pg Final  . MCHC 09/19/2019 32.7  30.0 - 36.0 g/dL Final  . RDW 09/19/2019 13.4  11.5 - 15.5 % Final  . Platelets 09/19/2019 254  150 - 400 K/uL Final  . nRBC 09/19/2019 0.0  0.0 - 0.2 % Final   Performed at [facility], [place] [address] [place], [place] [address]  . Troponin I (High Sensitivity) 09/19/2019 <2  <18 ng/L Final   Comment: (NOTE) Elevated high sensitivity troponin I (hsTnI) values and significant  changes across serial measurements may suggest ACS but many other  chronic and acute conditions are known to elevate hsTnI results.  Refer to the "Links" section for chest pain algorithms and additional  guidance. Performed at [facility], [place] [address] [place], [place] [address]   Office Visit on 08/16/2019  Component Date Value Ref Range Status  . Glucose 08/16/2019 101* 65 - 99 mg/dL Final  . BUN 08/16/2019 8  8 - 27 mg/dL Final  . Creatinine, Ser 08/16/2019 0.87  0.76 - 1.27 mg/dL Final  . GFR calc non Af Amer 08/16/2019 92  >59 mL/min/1.73 Final  . GFR calc Af Amer 08/16/2019 107  >59 mL/min/1.73 Final   Comment: **[facility] currently reports eGFR in compliance with the current**   recommendations of the [employer]. [facility] will   update reporting as new guidelines are published from the NKF-ASN   Task force.   . BUN/Creatinine Ratio 08/16/2019 9* 10 - 24  Final  . Sodium 08/16/2019 137  134 - 144 mmol/L Final  . Potassium 08/16/2019 4.5  3.5 - 5.2 mmol/L Final  . Chloride 08/16/2019 100  96 - 106 mmol/L Final  . CO2 08/16/2019 23  20 - 29 mmol/L Final  . Calcium 08/16/2019 9.7  8.6 - 10.2 mg/dL Final  . WBC 08/16/2019 6.9  3.4 - 10.8 x10E3/uL Final  . RBC 08/16/2019 5.51  4.14 - 5.80 x10E6/uL Final  . Hemoglobin 08/16/2019 14.5  13.0 - 17.7 g/dL Final  . Hematocrit 08/16/2019 42.9  37.5 - 51.0 % Final  . MCV 08/16/2019 78* 79 - 97 fL Final  . MCH 08/16/2019 26.3* 26.6 - 33.0 pg Final  . MCHC 08/16/2019 33.8  31 - 35 g/dL Final  . RDW 08/16/2019 14.1  11.6 - 15.4 % Final  . Platelets 08/16/2019 285  150 - 450 x10E3/uL Final  . Cholesterol, Total 08/16/2019 142  100 - 199 mg/dL Final  . Triglycerides 08/16/2019 78  0 - 149 mg/dL Final  . HDL 08/16/2019 53  >39 mg/dL Final  . VLDL Cholesterol Cal 08/16/2019 15  5 - 40 mg/dL Final  . LDL Chol Calc (NIH) 08/16/2019 74  0 - 99 mg/dL Final  . Chol/HDL Ratio 08/16/2019 2.7  0.0 - 5.0 ratio Final   Comment:                                   T. Chol/HDL Ratio                                             Men  Women  1/2 Avg.Risk  3.4    3.3                                   Avg.Risk  5.0    4.4                                2X Avg.Risk  9.6    7.1                                3X Avg.Risk 23.4   11.0   . TSH 08/16/2019 0.846  0.450 - 4.500 uIU/mL Final    Allergies: Morphine and related  PTA Medications: (Not in a hospital admission)   Medical Decision Making  CBC, CMP unremarkable TSH 3.362 UDS positive for opiates and THC   Remeron 7.5 mg QHS for depression/sleep     Recommendations  Based on my evaluation the patient does not appear to have an emergency medical condition.   Patient will be placed in the continuous assessment area at [facility] for treatment and stabilization. He will be reevaluated on 12/15/2019. The treatment team will determine  disposition at that time.      Rozetta Nunnery, NP 12/15/19  3:55 AM

## 2019-12-15 NOTE — ED Provider Notes (Signed)
FBC/OBS ASAP Discharge Summary  Date and Time: 12/15/2019 11:20 AM  Name: [name]  MRN:  [id number]   Discharge Diagnoses:  Final diagnoses:  Generalized anxiety disorder  Severe episode of recurrent major depressive disorder, without psychotic features [facility])    Stay Summary: From admission H&P: [name] is a 64 y.o male who presents voluntarily to [facility] with law enforcement due to worsening anxiety, depression, and suicidal thoughts. Patient states "I am tired of dealing with anxiety and withdrawing from valium. I might as well shoot myself." Patient endorses suicidal ideations, but denies having a specific plan. Patient was seen by Samuel Canner, NP at [facility] on 10/13/2019. At that time the patient reported that his medications were ineffective and that he was attempting to wean himself off cymbalta. Patient states that he is continuing to wean off cymbalta, but he has only been able to get down to about a third of a 20 mg cymbalta capsule, he counts the pellets.  He states that he was weaned off valium at the beginning of the year, but about six weeks ago he took around 15 valium over a 5 day period. Patient is prescribed Remeron, he states that he divides it and takes approximately 3.5 mg at night to help him sleep. He was prescribed buspirone in July, but he states that he did not feel it was helpful so he stopped taking it. Patient states that he feels he needs inpatient treatment for stabilization.   [name] was admitted to continuous observation for anxiety with reports of suicidal thoughts. Valium was discontinued by his outpatient provider in November 2020, and he reports increased anxiety since that time. He states he had some leftover Valium that he took over a five day period in July, but denies recent Valium use. He denies other drug use, although UDS is positive for oxycodone and THC. Per PDMP review his last oxycodone prescription was filled in March 2021 for 30 days. On  assessment this morning, patient reports anxiety but denies any SI/HI/AVH. He shows no signs of responding to internal stimuli. He is declining medications for anxiety because he states medications other than BZDs have worsened his anxiety in the past. He is open to counseling and scheduled for a therapy appointment at [facility], as well as provided with other outpatient mental health and substances use resources. He states he will call 911 or return to [facility] in case of SI. Denies access to firearms. He is discharging home via [employer].  Total Time spent with patient: 30 minutes  Past Psychiatric History: History of alcohol use disorder, anxiety, and depression. Past Medical History:  Past Medical History:  Diagnosis Date  . Alcohol abuse   . Allergy   . Bipolar disorder ([place])   . Chronic pain    lower back pain, "that has progressed"  . Depression   . Diverticulosis   . Dyspnea   . Hepatitis C    hx. of"states he no longer has"" is clear".  . History of recreational drug use    "cocaine, marijuana, polysubstance" quit 1987.  Marland Kitchen HLD (hyperlipidemia)   . Hypertension   . Pancreatitis 2015  . Panic attack   . Sleep apnea    no cpap used,still has machine(use rarely)    Past Surgical History:  Procedure Laterality Date  . CARDIAC CATHETERIZATION  2003   No CAD  . EUS N/A 06/23/2014   Procedure: UPPER ENDOSCOPIC ULTRASOUND (EUS) LINEAR;  Surgeon: Milus Banister, MD;  Location: [facility];  Service: Endoscopy;  Laterality: N/A;  . sinus surgery    . TONSILLECTOMY     Family History:  Family History  Problem Relation Age of Onset  . Heart disease Mother   . Kidney disease Mother   . COPD Mother   . Lupus Mother   . Heart disease Father   . Breast cancer Maternal Grandmother   . Diabetes Maternal Grandfather    Family Psychiatric History: Denies Social History:  Social History   Substance and Sexual Activity  Alcohol Use No  . Alcohol/week: 0.0 standard drinks    Comment: Quit in 1987     Social History   Substance and Sexual Activity  Drug Use No   Comment: Use to take cocaine, marijuana, per patient everything. Stopped in 1987.    Social History   Socioeconomic History  . Marital status: Divorced    Spouse name: Not on file  . Number of children: 0  . Years of education: GED  . Highest education level: Not on file  Occupational History  . Occupation: DISABLED  . Occupation:    Tobacco Use  . Smoking status: Former Smoker    Packs/day: 2.00    Years: 16.00    Pack years: 32.00    Types: Cigarettes    Quit date: 04/08/2002    Years since quitting: 17.6  . Smokeless tobacco: Never Used  Vaping Use  . Vaping Use: Never used  Substance and Sexual Activity  . Alcohol use: No    Alcohol/week: 0.0 standard drinks    Comment: Quit in 1987  . Drug use: No    Comment: Use to take cocaine, marijuana, per patient everything. Stopped in 1987.  Marland Kitchen Sexual activity: Not Currently    Partners: Female  Other Topics Concern  . Not on file  Social History Narrative   Patient lives at home alone.    Disabled.   Education GED    Both handed.   Caffeine two cups of coffee daily.      Social Determinants of Health   Financial Resource Strain:   . Difficulty of Paying Living Expenses: Not on file  Food Insecurity:   . Worried About Charity fundraiser in the Last Year: Not on file  . Ran Out of Food in the Last Year: Not on file  Transportation Needs:   . Lack of Transportation (Medical): Not on file  . Lack of Transportation (Non-Medical): Not on file  Physical Activity:   . Days of Exercise per Week: Not on file  . Minutes of Exercise per Session: Not on file  Stress:   . Feeling of Stress : Not on file  Social Connections:   . Frequency of Communication with Friends and Family: Not on file  . Frequency of Social Gatherings with Friends and Family: Not on file  . Attends Religious Services: Not on file  . Active Member of Clubs or  Organizations: Not on file  . Attends Archivist Meetings: Not on file  . Marital Status: Not on file   SDOH:  SDOH Screenings   Alcohol Screen:   . Last Alcohol Screening Score (AUDIT): Not on file  Depression (PHQ2-9): Medium Risk  . PHQ-2 Score: 23  Financial Resource Strain:   . Difficulty of Paying Living Expenses: Not on file  Food Insecurity:   . Worried About Charity fundraiser in the Last Year: Not on file  . Ran Out of Food in the Last Year:  Not on file  Housing:   . Last Housing Risk Score: Not on file  Physical Activity:   . Days of Exercise per Week: Not on file  . Minutes of Exercise per Session: Not on file  Social Connections:   . Frequency of Communication with Friends and Family: Not on file  . Frequency of Social Gatherings with Friends and Family: Not on file  . Attends Religious Services: Not on file  . Active Member of Clubs or Organizations: Not on file  . Attends Archivist Meetings: Not on file  . Marital Status: Not on file  Stress:   . Feeling of Stress : Not on file  Tobacco Use: Medium Risk  . Smoking Tobacco Use: Former Smoker  . Smokeless Tobacco Use: Never Used  Transportation Needs:   . Film/video editor (Medical): Not on file  . Lack of Transportation (Non-Medical): Not on file    Has this patient used any form of tobacco in the last 30 days? (Cigarettes, Smokeless Tobacco, Cigars, and/or Pipes) No  Current Medications:  Current Facility-Administered Medications  Medication Dose Route Frequency Provider Last Rate Last Admin  . acetaminophen (TYLENOL) tablet 650 mg  650 mg Oral Q6H PRN Rozetta Nunnery, NP      . alum & mag hydroxide-simeth (MAALOX/MYLANTA) 200-200-20 MG/5ML suspension 30 mL  30 mL Oral Q4H PRN Lindon Romp A, NP      . hydrOXYzine (ATARAX/VISTARIL) tablet 25 mg  25 mg Oral TID PRN Lindon Romp A, NP      . magnesium hydroxide (MILK OF MAGNESIA) suspension 30 mL  30 mL Oral Daily PRN Lindon Romp  A, NP      . mirtazapine (REMERON) tablet 7.5 mg  7.5 mg Oral QHS Lindon Romp A, NP   7.5 mg at 12/15/19 0046   Current Outpatient Medications  Medication Sig Dispense Refill  . DULoxetine (CYMBALTA) 20 MG capsule Take 20 mg by mouth daily.    . melatonin 1 MG TABS tablet Take 3 mg by mouth at bedtime.    . Oxycodone HCl 10 MG TABS Take 15 mg by mouth every 4 (four) hours as needed (pain).     . pantoprazole (PROTONIX) 40 MG tablet Take 40 mg by mouth daily.    . busPIRone (BUSPAR) 30 MG tablet Take 1 tablet (30 mg total) by mouth daily. (Patient not taking: Reported on 12/15/2019) 30 tablet 1  . famotidine (PEPCID) 40 MG tablet TAKE 1 TABLET BY MOUTH EVERY DAY (Patient not taking: Reported on 12/15/2019) 90 tablet 3  . magic mouthwash w/lidocaine SOLN Swish and swallow with 5 ml 3 times daily as needed. Benadryl: Maalox: Nystatin: Lidocaine 1: 1: 1: 1 (Patient not taking: Reported on 10/05/2019) 75 mL 0  . omeprazole (PRILOSEC) 40 MG capsule Take 1 capsule (40 mg total) by mouth daily. (Patient not taking: Reported on 10/05/2019) 90 capsule 0    PTA Medications: (Not in a hospital admission)   Musculoskeletal  Strength & Muscle Tone: within normal limits Gait & Station: normal Patient leans: N/A  Psychiatric Specialty Exam  Presentation  General Appearance: Casual  Eye Contact:Fair  Speech:Clear and Coherent;Normal Rate  Speech Volume:Normal  Handedness:Right   Mood and Affect  Mood:Anxious  Affect:Congruent   Thought Process  Thought Processes:Coherent  Descriptions of Associations:Tangential  Orientation:Full (Time, Place and Person)  Thought Content:Rumination  Hallucinations:Hallucinations: None  Ideas of Reference:None  Suicidal Thoughts:Suicidal Thoughts: No SI Active Intent and/or Plan: With Intent;With Plan  Homicidal  Thoughts:Homicidal Thoughts: No   Sensorium  Memory:Immediate Fair;Recent Fair;Remote  Fair  Judgment:Poor  Insight:Lacking   Executive Functions  Concentration:Fair  Attention Span:Fair  [place]   Psychomotor Activity  Psychomotor Activity:Psychomotor Activity: Normal   Assets  Assets:Desire for Improvement;Housing;Leisure Time   Sleep  Sleep:Sleep: Fair   Physical Exam  Physical Exam ROS Blood pressure (!) 129/97, pulse 73, temperature 98 F (36.7 C), temperature source Oral, resp. rate 18, SpO2 98 %. There is no height or weight on file to calculate BMI.  Demographic Factors:  Male, Caucasian and Living alone  Loss Factors: NA  Historical Factors: Impulsivity  Risk Reduction Factors:   NA  Continued Clinical Symptoms:  Severe Anxiety and/or Agitation  Cognitive Features That Contribute To Risk:  None    Suicide Risk:  Mild:  Suicidal ideation of limited frequency, intensity, duration, and specificity.  There are no identifiable plans, no associated intent, mild dysphoria and related symptoms, good self-control (both objective and subjective assessment), few other risk factors, and identifiable protective factors, including available and accessible social support.  Plan Of Care/Follow-up recommendations:  Activity as tolerated. Diet as recommended by primary care physician. Keep all scheduled follow-up appointments as recommended.  Disposition: Patient shows no evidence of acute risk of harm to self or others and is psych cleared for discharge to follow up outpatient.  Connye Burkitt, NP 12/15/2019, 11:20 AM

## 2020-02-04 ENCOUNTER — Ambulatory Visit (HOSPITAL_COMMUNITY): Payer: Medicare (Managed Care) | Admitting: Licensed Clinical Social Worker

## 2020-02-04 ENCOUNTER — Other Ambulatory Visit: Payer: Self-pay

## 2020-02-04 ENCOUNTER — Telehealth (HOSPITAL_COMMUNITY): Payer: Self-pay | Admitting: Licensed Clinical Social Worker

## 2020-02-04 NOTE — Telephone Encounter (Signed)
LCSW sent link for video session via text. Pt signed on but there were audio problems. He agreed for LCSW to phone him. When LCSW phoned pt and reviewed informed consent for counseling, discussed CCA process pt stated his anxiety was too bad at this time to manage assessment process. Pt asked to be rescheduled which LCSW facilitated.

## 2020-03-24 ENCOUNTER — Other Ambulatory Visit: Payer: Self-pay

## 2020-03-24 ENCOUNTER — Ambulatory Visit (HOSPITAL_COMMUNITY)
Admission: EM | Admit: 2020-03-24 | Discharge: 2020-03-25 | Disposition: A | Discharge status: Home / Self Care | Payer: Medicare (Managed Care) | Attending: Psychiatry | Admitting: Psychiatry

## 2020-03-24 ENCOUNTER — Telehealth (HOSPITAL_COMMUNITY): Payer: Self-pay | Admitting: Clinical

## 2020-03-24 DIAGNOSIS — Z79899 Other long term (current) drug therapy: Secondary | ICD-10-CM | POA: Insufficient documentation

## 2020-03-24 DIAGNOSIS — Z20822 Contact with and (suspected) exposure to covid-19: Secondary | ICD-10-CM | POA: Insufficient documentation

## 2020-03-24 DIAGNOSIS — F411 Generalized anxiety disorder: Secondary | ICD-10-CM | POA: Diagnosis not present

## 2020-03-24 DIAGNOSIS — R45851 Suicidal ideations: Secondary | ICD-10-CM | POA: Diagnosis present

## 2020-03-24 DIAGNOSIS — Z87891 Personal history of nicotine dependence: Secondary | ICD-10-CM | POA: Diagnosis not present

## 2020-03-24 DIAGNOSIS — F332 Major depressive disorder, recurrent severe without psychotic features: Secondary | ICD-10-CM

## 2020-03-24 LAB — RESP PANEL BY RT-PCR (FLU A&B, COVID) ARPGX2
Influenza A by PCR: NEGATIVE
Influenza B by PCR: NEGATIVE
SARS Coronavirus 2 by RT PCR: NEGATIVE

## 2020-03-24 LAB — CBC WITH DIFFERENTIAL/PLATELET
Abs Immature Granulocytes: 0.02 10*3/uL (ref 0.00–0.07)
Basophils Absolute: 0 10*3/uL (ref 0.0–0.1)
Basophils Relative: 1 %
Eosinophils Absolute: 0 10*3/uL (ref 0.0–0.5)
Eosinophils Relative: 0 %
HCT: 46 % (ref 39.0–52.0)
Hemoglobin: 15.2 g/dL (ref 13.0–17.0)
Immature Granulocytes: 0 %
Lymphocytes Relative: 23 %
Lymphs Abs: 1.5 10*3/uL (ref 0.7–4.0)
MCH: 26.7 pg (ref 26.0–34.0)
MCHC: 33 g/dL (ref 30.0–36.0)
MCV: 80.8 fL (ref 80.0–100.0)
Monocytes Absolute: 0.6 10*3/uL (ref 0.1–1.0)
Monocytes Relative: 9 %
Neutro Abs: 4.5 10*3/uL (ref 1.7–7.7)
Neutrophils Relative %: 67 %
Platelets: 268 10*3/uL (ref 150–400)
RBC: 5.69 MIL/uL (ref 4.22–5.81)
RDW: 13.3 % (ref 11.5–15.5)
WBC: 6.7 10*3/uL (ref 4.0–10.5)
nRBC: 0 % (ref 0.0–0.2)

## 2020-03-24 LAB — POCT URINE DRUG SCREEN - MANUAL ENTRY (I-SCREEN)
POC Amphetamine UR: NOT DETECTED
POC Buprenorphine (BUP): NOT DETECTED
POC Cocaine UR: NOT DETECTED
POC Marijuana UR: POSITIVE — AB
POC Methadone UR: NOT DETECTED
POC Methamphetamine UR: NOT DETECTED
POC Morphine: NOT DETECTED
POC Oxazepam (BZO): NOT DETECTED
POC Oxycodone UR: NOT DETECTED
POC Secobarbital (BAR): NOT DETECTED

## 2020-03-24 LAB — LIPID PANEL
Cholesterol: 170 mg/dL (ref 0–200)
HDL: 62 mg/dL (ref >40)
LDL Cholesterol: 99 mg/dL (ref 0–99)
Total CHOL/HDL Ratio: 2.7 RATIO
Triglycerides: 44 mg/dL (ref <150)
VLDL: 9 mg/dL (ref 0–40)

## 2020-03-24 LAB — COMPREHENSIVE METABOLIC PANEL
ALT: 27 U/L (ref 0–44)
AST: 25 U/L (ref 15–41)
Albumin: 4.3 g/dL (ref 3.5–5.0)
Alkaline Phosphatase: 63 U/L (ref 38–126)
Anion gap: 16 — ABNORMAL HIGH (ref 5–15)
BUN: 14 mg/dL (ref 8–23)
CO2: 23 mmol/L (ref 22–32)
Calcium: 9.7 mg/dL (ref 8.9–10.3)
Chloride: 101 mmol/L (ref 98–111)
Creatinine, Ser: 1.01 mg/dL (ref 0.61–1.24)
GFR, Estimated: >60 mL/min (ref >60)
Glucose, Bld: 92 mg/dL (ref 70–99)
Potassium: 3.8 mmol/L (ref 3.5–5.1)
Sodium: 140 mmol/L (ref 135–145)
Total Bilirubin: 0.8 mg/dL (ref 0.3–1.2)
Total Protein: 7.2 g/dL (ref 6.5–8.1)

## 2020-03-24 LAB — HEMOGLOBIN A1C
Hgb A1c MFr Bld: 5.5 % (ref 4.8–5.6)
Mean Plasma Glucose: 111.15 mg/dL

## 2020-03-24 LAB — ETHANOL: Alcohol, Ethyl (B): <10 mg/dL (ref <10)

## 2020-03-24 LAB — POC SARS CORONAVIRUS 2 AG: SARS Coronavirus 2 Ag: NEGATIVE

## 2020-03-24 LAB — POC SARS CORONAVIRUS 2 AG -  ED: SARS Coronavirus 2 Ag: NEGATIVE

## 2020-03-24 LAB — TSH: TSH: 2.266 u[IU]/mL (ref 0.350–4.500)

## 2020-03-24 MED ORDER — SUCRALFATE 1 G PO TABS: 1.0000 g | ORAL_TABLET | Freq: Once | ORAL | Status: DC

## 2020-03-24 MED ORDER — DULOXETINE HCL 60 MG PO CPEP: 60.0000 mg | ORAL_CAPSULE | Freq: Once | ORAL | Status: DC

## 2020-03-24 MED ORDER — HALOPERIDOL 5 MG PO TABS
ORAL_TABLET | ORAL | Status: AC
  Administered 2020-03-24: 5 mg
  Filled 2020-03-24: qty 1

## 2020-03-24 MED ORDER — GABAPENTIN 300 MG PO CAPS: 300.0000 mg | ORAL_CAPSULE | Freq: Three times a day (TID) | ORAL | Status: DC

## 2020-03-24 MED ORDER — HALOPERIDOL 5 MG PO TABS: 5.0000 mg | ORAL_TABLET | Freq: Four times a day (QID) | ORAL | Status: DC | PRN

## 2020-03-24 MED ORDER — QUETIAPINE FUMARATE 50 MG PO TABS: 50.0000 mg | ORAL_TABLET | Freq: Every day | ORAL | Status: DC

## 2020-03-24 MED ORDER — ACETAMINOPHEN 325 MG PO TABS: 650.0000 mg | ORAL_TABLET | Freq: Four times a day (QID) | ORAL | Status: DC | PRN

## 2020-03-24 MED ORDER — QUETIAPINE FUMARATE 50 MG PO TABS
50.0000 mg | ORAL_TABLET | Freq: Once | ORAL | Status: AC
  Administered 2020-03-24: 50 mg via ORAL
  Filled 2020-03-24: qty 1

## 2020-03-24 MED ORDER — MAGNESIUM HYDROXIDE 400 MG/5ML PO SUSP: 30.0000 mL | Freq: Every day | ORAL | Status: DC | PRN

## 2020-03-24 MED ORDER — MIRTAZAPINE 7.5 MG PO TABS
7.5000 mg | ORAL_TABLET | Freq: Every day | ORAL | Status: DC
  Administered 2020-03-24: 7.5 mg via ORAL
  Filled 2020-03-24: qty 1

## 2020-03-24 MED ORDER — ALUM & MAG HYDROXIDE-SIMETH 200-200-20 MG/5ML PO SUSP: 30.0000 mL | ORAL | Status: DC | PRN

## 2020-03-24 NOTE — [facility] Counselor (Signed)
This counselor received a phone call from [phone]. The patient did not give a name but stated he was a patient here. When searched this individual came up. He reported several medical issues such as burning in his throat. He stated he had been clean from pain pills until today. He states he has 2 bottles of Valium he is considering taking. He also mentioned having a gun. While attempting to get patient's address he abruptly hung up the phone. This counselor contacted [facility] [employer] to request a welfare check. Dispatcher states [employer] would be going to patient's home to check on him.

## 2020-03-24 NOTE — ED Notes (Signed)
Pt sleeping at present, no distress noted, monitoring for safety. 

## 2020-03-24 NOTE — ED Notes (Addendum)
Pt refused his Carafate, Gabapentin and Cymbalta. Attempted to encourage PT to take these other meds and the reason for each, Pt continued to refuse.

## 2020-03-24 NOTE — ED Notes (Signed)
Given sandwich. 

## 2020-03-24 NOTE — ED Provider Notes (Signed)
Behavioral Health Admission H&P [facility] & OBS)  Date: 03/24/20 Patient Name: [name] MRN: [id number] Chief Complaint: No chief complaint on file.     Diagnoses:  Final diagnoses:  Generalized anxiety disorder  Severe episode of recurrent major depressive disorder, without psychotic features (HCC)    HPI: Patient is a 64 year old male that presented to the [facility] voluntarily via Event organiser.  Patient then contacted the [place] [facility] on the crisis line and spoke to on the TTS counselor.  The patient was stating that he was having burning sensation in his throat and that he had been clean from pain pills until today and made comments about having bottles of Valium he was considering taking and also mentioned having a gun.  Patient then hung up on the TTS counselor and the police were contacted to go do a safety check on the patient.  Patient regard and today is continued to report similar events as he is previously admissions here.  Patient does report a long history of substance abuse along with anxiety.  Patient reports he started abusing drugs of 64 years old and finally got clean when he was 64 years old.  He states since then he is been on methadone and got clean, Suboxone and got clean, however, Valium has been his drug of choice to use to help maintain his anxiety and I will prescribe him any more because of his history of substance abuse.  He states that he understands that.  He reports that over the years he has been on Valium, Ativan, Cymbalta, Suboxone, BuSpar, gabapentin, Abilify, and Remeron.  Patient reports that he does not take his medications as prescribed because he continues reading on a site about medications that are safe to take with benzodiazepines and so he has been tapering himself down off the Cymbalta and made a statement that he is down to 33 of the pellets inside of the capsule which she could not clarify but it sounds as though the patient is opening the Cymbalta capsule and  counting the pellets that are inside.  The patient states that his symptoms from his anxiety has continued to get worse and he feels that it is due to him not having his value anymore.  He does state and understand that he will not find someone who will prescribe him any Valium anymore and after discussing medications he has an excuse for almost every single medication that is offered to him.  Some he states there is nothing for him's and the other once he reports that there not good medications to take and they are bad for you.  Patient continues to endorse following the instructions of a specific website about using benzodiazepines.  Patient eventually gets to the point of why he came in today.  He states that his symptoms have gotten so worse that he was considering taking his own life and taking his dog life as well.  He states he just cannot deal with it anymore. Patient presents pacing, tangential and pressured speech, reporting poor sleep and disrupted sleep, racing thoughts, severe anxiety, and irritability.  Discussed with the patient about using medications that are appropriate for him that are not controlled and the patient becomes a little irritated with the discussion about the medications and make statements like he should have came here to begin with.  Patient finally agrees to start medications in the process and this could patient also reports that he has been diagnosed with bipolar in the past but  he states that he is not have bipolar and they were wrong and he just needed his Valium.  Patient agrees to start Seroquel 50 mg p.o. nightly, gabapentin 300 mg p.o. 3 times daily, and to continue his Cymbalta 60 mg p.o. daily.  Patient will be admitted to the continuous sedation unit.  PHQ 2-9:  Section ED from 03/24/2020 in [facility] Office Visit from 09/15/2019 in [place] Office Visit from 08/16/2019 in [place]   Thoughts that you would be better off dead, or of hurting yourself in some way Nearly every day  [Phreesia 03/24/2020] Not at all More than half the days  PHQ-9 Total Score 24 23 13       Flowsheet Row ED from 03/24/2020 in [facility] ED from 12/14/2019 in [facility] CATEGORY High Risk Low Risk       Total Time spent with patient: 45 minutes  Musculoskeletal  Strength & Muscle Tone: within normal limits Gait & Station: normal Patient leans: N/A  Psychiatric Specialty Exam  Presentation General Appearance: Disheveled  Eye Contact:Fair  Speech:Clear and Coherent; Pressured  Speech Volume:Increased  Handedness:Right   Mood and Affect  Mood:Anxious; Depressed; Hopeless; Irritable  Affect:Congruent; Depressed   Thought Process  Thought Processes:Coherent  Descriptions of Associations:Intact  Orientation:Full (Time, Place and Person)  Thought Content:Tangential; Rumination  Hallucinations:Hallucinations: None  Ideas of Reference:None  Suicidal Thoughts:Suicidal Thoughts: Yes, Passive SI Passive Intent and/or Plan: Without Intent; Without Plan  Homicidal Thoughts:Homicidal Thoughts: No   Sensorium  Memory:Immediate Good; Recent Good; Remote Good  Judgment:Fair  Insight:Lacking   Executive Functions  Concentration:Fair  Attention Span:Fair  [place]   Psychomotor Activity  Psychomotor Activity:Psychomotor Activity: Increased   Assets  Assets:Communication Skills; Desire for Improvement; Financial Resources/Insurance; Housing   Sleep  Sleep:Sleep: Poor   Physical Exam Vitals and nursing note reviewed.  Constitutional:      Appearance: He is well-developed.  HENT:     Head: Normocephalic.  Eyes:     Pupils: Pupils are equal, round, and reactive to light.  Cardiovascular:     Rate and Rhythm: Normal rate.  Pulmonary:      Effort: Pulmonary effort is normal.  Musculoskeletal:        General: Normal range of motion.  Neurological:     Mental Status: He is alert and oriented to person, place, and time.  Psychiatric:        Mood and Affect: Mood is anxious and depressed.        Speech: Speech is tangential.        Behavior: Behavior is hyperactive.        Thought Content: Thought content includes suicidal ideation.    Review of Systems  Constitutional: Negative.   HENT: Negative.   Eyes: Negative.   Respiratory: Negative.   Cardiovascular: Negative.   Gastrointestinal: Negative.   Genitourinary: Negative.   Musculoskeletal: Negative.   Skin: Negative.   Neurological: Negative.   Endo/Heme/Allergies: Negative.   Psychiatric/Behavioral: Positive for depression and suicidal ideas. The patient is nervous/anxious.     Blood pressure 124/90, pulse 99, temperature 98.4 F (36.9 C), temperature source Oral, resp. rate 18, SpO2 95 %. There is no height or weight on file to calculate BMI.  Past Psychiatric History: Polysubstance abuse, addiction to benzos, MDD, anxiety, panic attacks, bipolar disorder  Is the patient at risk to  self? Yes  Has the patient been a risk to self in the past 6 months? No .    Has the patient been a risk to self within the distant past? No   Is the patient a risk to others? No   Has the patient been a risk to others in the past 6 months? No   Has the patient been a risk to others within the distant past? No   Past Medical History:  Past Medical History:  Diagnosis Date  . Alcohol abuse   . Allergy   . Bipolar disorder ([place])   . Chronic pain    lower back pain, "that has progressed"  . Depression   . Diverticulosis   . Dyspnea   . Hepatitis C    hx. of"states he no longer has"" is clear".  . History of recreational drug use    "cocaine, marijuana, polysubstance" quit 1987.  Marland Kitchen HLD (hyperlipidemia)   . Hypertension   . Pancreatitis 2015  . Panic attack   . Sleep  apnea    no cpap used,still has machine(use rarely)    Past Surgical History:  Procedure Laterality Date  . CARDIAC CATHETERIZATION  2003   No CAD  . EUS N/A 06/23/2014   Procedure: UPPER ENDOSCOPIC ULTRASOUND (EUS) LINEAR;  Surgeon: Milus Banister, MD;  Location: [facility] ENDOSCOPY;  Service: Endoscopy;  Laterality: N/A;  . sinus surgery    . TONSILLECTOMY      Family History:  Family History  Problem Relation Age of Onset  . Heart disease Mother   . Kidney disease Mother   . COPD Mother   . Lupus Mother   . Heart disease Father   . Breast cancer Maternal Grandmother   . Diabetes Maternal Grandfather     Social History:  Social History   Socioeconomic History  . Marital status: Divorced    Spouse name: Not on file  . Number of children: 0  . Years of education: GED  . Highest education level: Not on file  Occupational History  . Occupation: DISABLED  . Occupation:    Tobacco Use  . Smoking status: Former Smoker    Packs/day: 2.00    Years: 16.00    Pack years: 32.00    Types: Cigarettes    Quit date: 04/08/2002    Years since quitting: 17.9  . Smokeless tobacco: Never Used  Vaping Use  . Vaping Use: Never used  Substance and Sexual Activity  . Alcohol use: No    Alcohol/week: 0.0 standard drinks    Comment: Quit in 1987  . Drug use: No    Comment: Use to take cocaine, marijuana, per patient everything. Stopped in 1987.  Marland Kitchen Sexual activity: Not Currently    Partners: Female  Other Topics Concern  . Not on file  Social History Narrative   Patient lives at home alone.    Disabled.   Education GED    Both handed.   Caffeine two cups of coffee daily.      Social Determinants of Health   Financial Resource Strain: Not on file  Food Insecurity: Not on file  Transportation Needs: Not on file  Physical Activity: Not on file  Stress: Not on file  Social Connections: Not on file  Intimate Partner Violence: Not on file    SDOH:  SDOH Screenings   Alcohol  Screen: Not on file  Depression (PHQ2-9): Medium Risk  . PHQ-2 Score: 24  Financial Resource Strain: Not on file  Food Insecurity: Not on file  Housing: Not on file  Physical Activity: Not on file  Social Connections: Not on file  Stress: Not on file  Tobacco Use: Medium Risk  . Smoking Tobacco Use: Former Smoker  . Smokeless Tobacco Use: Never Used  Transportation Needs: Not on file    Last Labs:  Admission on 12/14/2019, Discharged on 12/15/2019  Component Date Value Ref Range Status  . SARS Coronavirus 2 12/14/2019 NEGATIVE  NEGATIVE Final   Comment: (NOTE) SARS-CoV-2 target nucleic acids are NOT DETECTED.  The SARS-CoV-2 RNA is generally detectable in upper and lower respiratory specimens during the acute phase of infection. The lowest concentration of SARS-CoV-2 viral copies this assay can detect is 250 copies / mL. A negative result does not preclude SARS-CoV-2 infection and should not be used as the sole basis for treatment or other patient management decisions.  A negative result may occur with improper specimen collection / handling, submission of specimen other than nasopharyngeal swab, presence of viral mutation(s) within the areas targeted by this assay, and inadequate number of viral copies (<250 copies / mL). A negative result must be combined with clinical observations, patient history, and epidemiological information.  Fact Sheet for Patients:   StrictlyIdeas.no  Fact Sheet for Healthcare Providers: BankingDealers.co.za  This test is not yet approved or                           cleared by the [place] FDA and has been authorized for detection and/or diagnosis of SARS-CoV-2 by FDA under an Emergency Use Authorization (EUA).  This EUA will remain in effect (meaning this test can be used) for the duration of the COVID-19 declaration under Section 564(b)(1) of the Act, 21 U.S.C. section 360bbb-3(b)(1),  unless the authorization is terminated or revoked sooner.  Performed at [facility], [place] [address], [place], [place] [address]   . WBC 12/14/2019 10.1  4.0 - 10.5 K/uL Final  . RBC 12/14/2019 5.90* 4.22 - 5.81 MIL/uL Final  . Hemoglobin 12/14/2019 15.4  13.0 - 17.0 g/dL Final  . HCT 12/14/2019 46.6  39.0 - 52.0 % Final  . MCV 12/14/2019 79.0* 80.0 - 100.0 fL Final  . MCH 12/14/2019 26.1  26.0 - 34.0 pg Final  . MCHC 12/14/2019 33.0  30.0 - 36.0 g/dL Final  . RDW 12/14/2019 13.2  11.5 - 15.5 % Final  . Platelets 12/14/2019 272  150 - 400 K/uL Final  . nRBC 12/14/2019 0.0  0.0 - 0.2 % Final  . Neutrophils Relative % 12/14/2019 64  % Final  . Neutro Abs 12/14/2019 6.5  1.7 - 7.7 K/uL Final  . Lymphocytes Relative 12/14/2019 26  % Final  . Lymphs Abs 12/14/2019 2.6  0.7 - 4.0 K/uL Final  . Monocytes Relative 12/14/2019 8  % Final  . Monocytes Absolute 12/14/2019 0.8  0.1 - 1.0 K/uL Final  . Eosinophils Relative 12/14/2019 1  % Final  . Eosinophils Absolute 12/14/2019 0.1  0.0 - 0.5 K/uL Final  . Basophils Relative 12/14/2019 1  % Final  . Basophils Absolute 12/14/2019 0.1  0.0 - 0.1 K/uL Final  . Immature Granulocytes 12/14/2019 0  % Final  . Abs Immature Granulocytes 12/14/2019 0.03  0.00 - 0.07 K/uL Final   Performed at [facility], [place] [address], [place], [place] [address]  . Sodium 12/14/2019 139  135 - 145 mmol/L Final  . Potassium 12/14/2019 3.9  3.5 - 5.1 mmol/L Final  . Chloride 12/14/2019 104  98 - 111 mmol/L Final  . CO2 12/14/2019 25  22 - 32 mmol/L Final  . Glucose, Bld 12/14/2019 87  70 - 99 mg/dL Final   Glucose reference range applies only to samples taken after fasting for at least 8 hours.  . BUN 12/14/2019 11  8 - 23 mg/dL Final  . Creatinine, Ser 12/14/2019 0.99  0.61 - 1.24 mg/dL Final  . Calcium 12/14/2019 9.8  8.9 - 10.3 mg/dL Final  . Total Protein 12/14/2019 7.4  6.5 - 8.1 g/dL Final  . Albumin 12/14/2019 4.4  3.5 - 5.0 g/dL Final  . AST  12/14/2019 27  15 - 41 U/L Final  . ALT 12/14/2019 25  0 - 44 U/L Final  . Alkaline Phosphatase 12/14/2019 81  38 - 126 U/L Final  . Total Bilirubin 12/14/2019 0.5  0.3 - 1.2 mg/dL Final  . GFR calc non Af Amer 12/14/2019 >60  >60 mL/min Final  . GFR calc Af Amer 12/14/2019 >60  >60 mL/min Final  . Anion gap 12/14/2019 10  5 - 15 Final   Performed at [facility], [place] [address], [place], [place] [address]  . TSH 12/14/2019 3.362  0.350 - 4.500 uIU/mL Final   Comment: Performed by a 3rd Generation assay with a functional sensitivity of <=0.01 uIU/mL. Performed at [facility], [place] [address] [place], [place] [address]   . POC Amphetamine UR 12/14/2019 None Detected  None Detected Final  . POC Secobarbital (BAR) 12/14/2019 None Detected  None Detected Final  . POC Buprenorphine (BUP) 12/14/2019 None Detected  None Detected Final  . POC Oxazepam (BZO) 12/14/2019 None Detected  None Detected Final  . POC Cocaine UR 12/14/2019 None Detected  None Detected Final  . POC Methamphetamine UR 12/14/2019 None Detected  None Detected Final  . POC Morphine 12/14/2019 None Detected  None Detected Final  . POC Oxycodone UR 12/14/2019 Positive* None Detected Final  . POC Methadone UR 12/14/2019 None Detected  None Detected Final  . POC Marijuana UR 12/14/2019 Positive* None Detected Final  . SARS Coronavirus 2 Ag 12/14/2019 Negative  Negative Preliminary    Allergies: Morphine and related  PTA Medications: (Not in a hospital admission)   Medical Decision Making  Labs and covid test ordered Admit to continuous observation Continued Cymbalta 60 mg PO Daily Start Gabapentin 300 mg PO TID Start Seroquel 50 mg PO QHS    Recommendations  Based on my evaluation the patient does not appear to have an emergency medical condition.  [place], FNP 03/24/20  2:12 PM

## 2020-03-24 NOTE — ED Notes (Signed)
Pt accepted Haldol after encouragement.

## 2020-03-24 NOTE — ED Notes (Signed)
Pt A&O x 4, resting at present, no distress noted, calm & cooperative, monitoring for safety.

## 2020-03-24 NOTE — BH Assessment (Signed)
Comprehensive Clinical Assessment (CCA) Screening, Triage and Referral Note  03/24/2020 [name] [id number] Patient presents this date voluntary via law enforcement after contacting crisis line this date.   [place] LCSW writes as of that call this date at 1203: This counselor received a phone call from [phone]. The patient did not give a name but stated he was a patient here. When searched this individual came up. He reported several medical issues such as burning in his throat. He stated he had been clean from pain pills until today. He states he has 2 bottles of Valium he is considering taking. He also mentioned having a gun. While attempting to get patient's address he abruptly hung up the phone. This counselor contacted [facility] [employer] to request a welfare check. Dispatcher states [employer] would be going to patient's home to check on him.   Money NP evlauted patient and writes:  Patient is a 64 year old male that presented to the [facility] voluntarily via Event organiser. Patient then contacted the [facility] on the crisis line and spoke to on the TTS counselor. The patient was stating that he was having burning sensation in his throat and that he had been clean from pain pills until today and made comments about having bottles of Valium he was considering taking and also mentioned having a gun. Patient then hung up on the TTS counselor and the police were contacted to go do a safety check on the patient.  Patient regard and today is continued to report similar events as he is previously admissions here.  Patient does report a long history of substance abuse along with anxiety.  Patient reports he started abusing drugs of 64 years old and finally got clean when he was 64 years old.  He states since then he is been on methadone and got clean, Suboxone and got clean, however, Valium has been his drug of choice to use to help maintain his anxiety and I will prescribe him any more because of  his history of substance abuse.  He states that he understands that.  He reports that over the years he has been on Valium, Ativan, Cymbalta, Suboxone, BuSpar, gabapentin, Abilify, and Remeron.  Patient reports that he does not take his medications as prescribed because he continues reading on a site about medications that are safe to take with benzodiazepines and so he has been tapering himself down off the Cymbalta and made a statement that he is down to 33 of the pellets inside of the capsule which she could not clarify but it sounds as though the patient is opening the Cymbalta capsule and counting the pellets that are inside.  The patient states that his symptoms from his anxiety has continued to get worse and he feels that it is due to him not having his value anymore.  He does state and understand that he will not find someone who will prescribe him any Valium anymore and after discussing medications he has an excuse for almost every single medication that is offered to him.  Some he states there is nothing for him's and the other once he reports that there not good medications to take and they are bad for you.  Patient continues to endorse following the instructions of a specific website about using benzodiazepines.  Patient eventually gets to the point of why he came in today.  He states that his symptoms have gotten so worse that he was considering taking his own life and taking his dog  life as well.  He states he just cannot deal with it anymore. Patient presents pacing, tangential and pressured speech, reporting poor sleep and disrupted sleep, racing thoughts, severe anxiety, and irritability.  Discussed with the patient about using medications that are appropriate for him that are not controlled and the patient becomes a little irritated with the discussion about the medications and make statements like he should have came here to begin with.  Patient finally agrees to start medications in the process  and this could patient also reports that he has been diagnosed with bipolar in the past but he states that he is not have bipolar and they were wrong and he just needed his Valium.  Patient agrees to start Seroquel 50 mg p.o. nightly, gabapentin 300 mg p.o. 3 times daily, and to continue his Cymbalta 60 mg p.o. daily.  Patient will be admitted to the continuous sedation unit.   Chief Complaint: No chief complaint on file.  Visit Diagnosis: MDD recurrent without psychotic features, severe  Patient Reported Information How did you hear about [place]? Self   Referral name: [employer] (Phreesia 03/24/2020)   Referral phone number: No data recorded Whom do you see for routine medical problems? I don't have a doctor   Practice/Facility Name: No data recorded  Practice/Facility Phone Number: No data recorded  Name of Contact: No data recorded  Contact Number: No data recorded  Contact Fax Number: No data recorded  Prescriber Name: Pain clinic; [facility] (has a new doctor that pt has seen once)   Prescriber Address (if known): No data recorded What Is the Reason for Your Visit/Call Today? Ongoing anxiety  How Long Has This Been Causing You Problems? > than 6 months  Have You Recently Been in Any Inpatient Treatment (Hospital/Detox/Crisis Center/28-Day Program)? No   Name/Location of Program/Hospital:No data recorded  How Long Were You There? No data recorded  When Were You Discharged? No data recorded Have You Ever Received Services From [facility] Before? Yes   Who Do You See at [facility]? Pt has been assessed for services in the past  Have You Recently Had Any Thoughts About Hurting Yourself? Yes   Are You Planning to Commit Suicide/Harm Yourself At This time?  No  Have you Recently Had Thoughts About [place]? No   Explanation: Hurting The Dog (Phreesia 03/24/2020)  Have You Used Any Alcohol or Drugs in the Past 24 Hours? No   How Long Ago Did You Use Drugs or  Alcohol?  No data recorded  What Did You Use and How Much? No data recorded What Do You Feel Would Help You the Most Today? Medication  Do You Currently Have a Therapist/Psychiatrist? No   Name of Therapist/Psychiatrist: Pt reports that he was under the care of a psychiatrist but was discharged from the practice for medication noncompliance   Have You Been Recently Discharged From Any Office Practice or Programs? No   Explanation of Discharge From Practice/Program:  psychiatric practice in the past (not recently)     CCA Screening Triage Referral Assessment Type of Contact: Face-to-Face   Is this Initial or Reassessment? No data recorded  Date Telepsych consult ordered in [facility]:  No data recorded  Time Telepsych consult ordered in [facility]:  No data recorded Patient Reported Information Reviewed? Yes   Patient Left Without Being Seen? No data recorded  Reason for Not Completing Assessment: No data recorded Collateral Involvement: N/A  Does Patient Have a Court Appointed Legal Guardian?  No data recorded  Name and Contact of Legal Guardian:  No data recorded If Minor and Not Living with Parent(s), Who has Custody? No data recorded Is CPS involved or ever been involved? Never  Is APS involved or ever been involved? Never  Patient Determined To Be At Risk for Harm To Self or Others Based on Review of Patient Reported Information or Presenting Complaint? Yes, for Self-Harm   Method: No data recorded  Availability of Means: No data recorded  Intent: No data recorded  Notification Required: No data recorded  Additional Information for Danger to Others Potential:  No data recorded  Additional Comments for Danger to Others Potential:  No data recorded  Are There Guns or Other Weapons in Your Home?  No data recorded   Types of Guns/Weapons: No data recorded   Are These Weapons Safely Secured?                              No data recorded   Who Could Verify You Are Able To Have These  Secured:    No data recorded Do You Have any Outstanding Charges, Pending Court Dates, Parole/Probation? No data recorded Contacted To Inform of Risk of Harm To Self or Others: -- (NA)  Location of Assessment: [facility] Services  Does Patient Present under Involuntary Commitment? No   IVC Papers Initial File Date: No data recorded  [place] of Residence: [place]  Patient Currently Receiving the Following Services: Not Receiving Services   Determination of Need: -- (Observation)   Options For Referral: Outpatient Therapy   Mamie Nick, LCAS

## 2020-03-25 MED ORDER — DULOXETINE HCL 60 MG PO CPEP: 60.0000 mg | ORAL_CAPSULE | Freq: Every day | ORAL | 0 refills | Status: DC

## 2020-03-25 MED ORDER — MENTHOL 3 MG MT LOZG
1.0000 | LOZENGE | Freq: Once | OROMUCOSAL | Status: DC
  Filled 2020-03-25 (×2): qty 9

## 2020-03-25 MED ORDER — SUCRALFATE 1 G PO TABS: 1.0000 g | ORAL_TABLET | Freq: Once | ORAL | 0 refills | Status: DC

## 2020-03-25 MED ORDER — GABAPENTIN 300 MG PO CAPS: 300.0000 mg | ORAL_CAPSULE | Freq: Three times a day (TID) | ORAL | 0 refills | Status: DC

## 2020-03-25 MED ORDER — ALUM & MAG HYDROXIDE-SIMETH 200-200-20 MG/5ML PO SUSP: 30.0000 mL | ORAL | 0 refills | Status: DC | PRN

## 2020-03-25 MED ORDER — ACETAMINOPHEN 325 MG PO TABS: 650.0000 mg | ORAL_TABLET | Freq: Four times a day (QID) | ORAL | Status: DC | PRN

## 2020-03-25 MED ORDER — MAGNESIUM HYDROXIDE 400 MG/5ML PO SUSP: 30.0000 mL | Freq: Every day | ORAL | 0 refills | Status: DC | PRN

## 2020-03-25 MED ORDER — HALOPERIDOL 5 MG PO TABS: 5.0000 mg | ORAL_TABLET | Freq: Four times a day (QID) | ORAL | 0 refills | Status: DC | PRN

## 2020-03-25 MED ORDER — MENTHOL 3 MG MT LOZG: 1.0000 | LOZENGE | Freq: Once | OROMUCOSAL | 12 refills | Status: AC

## 2020-03-25 MED ORDER — QUETIAPINE FUMARATE 50 MG PO TABS: 50.0000 mg | ORAL_TABLET | Freq: Every day | ORAL | 0 refills | Status: DC

## 2020-03-25 NOTE — ED Notes (Signed)
Given  breakfast

## 2020-03-25 NOTE — ED Notes (Signed)
Pt alert and oriented on the unit. Education, support, and encouragement provided. Discharge summary, medications and follow up appointments reviewed with pt. Suicide prevention resources provided. Pt's belongings in locker returned and belongings sheet signed. Pt denies SI/HI, A/VH, pain, or any concerns at this time. Pt ambulatory on and off unit. Pt discharged to lobby and transported home by [employer].

## 2020-03-25 NOTE — ED Notes (Signed)
Pt alert, oriented, and ambulatory. Pt has been pacing on the unit and has been engaging with RN staff and other pts. Pt denies SI/HI, AVH, pain or any concerns at this time. Pt remains safe on the unit.

## 2020-03-25 NOTE — Discharge Instructions (Addendum)

## 2020-03-25 NOTE — ED Provider Notes (Signed)
[facility]BC/OBS ASAP Discharge Summary  Date and Time: 03/25/2020 11:02 AM  Name: [name]  MRN:  [id number]   Discharge Diagnoses:  Final diagnoses:  Generalized anxiety disorder  Severe episode of recurrent major depressive disorder, without psychotic features ([place])    Subjective: Patient states "this started when I started taking Valium again in July."  Patient reports readiness to discharge home today.  Patient reports chronic concern for anxiety and feels that he has been suffering from withdrawal symptoms related to long-term Valium use for approximately 3 years. Patient reports he briefly began taking Valium again earlier in 2021, last value in July 2021. Patient reports since July he has symptoms of benzodiazepine withdrawal including sore throat and chronic pain.  Patient reports "my mood improves when I am not taking Valium and not taking pain meds." Patient does appear to have adequate insight regarding prescription medications. Patient reports he does not plan to use benzodiazepine medications moving forward.  Patient resides in [place]. Patient denies access to weapons. Patient reports he is currently not working but receives disability benefits. Patient denies both alcohol and substance use. Patient reports he has history of substance use disorder approximately 20 years ago. Patient endorses average sleep and appetite.  Patient reports he has been diagnosed with generalized anxiety disorder in the past. Patient reports he does not currently have outpatient psychiatry follow-up as he has difficulty finding physician who "handles his medication appropriately." Patient reports plan to follow-up with outpatient psychiatry once discharged.  Patient assessed by nurse practitioner. Patient alert and oriented, answers appropriately. Patient denies suicidal ideations currently. Patient reports "I said that because the symptoms of withdrawal seemed out of control." Patient denies any plan or  intent to harm self. Patient endorses 2 prior suicide attempts states they were "many years ago." Patient states "I believe in God and I could not end up doing it." Patient contracts verbally for safety with this Probation officer.  Patient denies homicidal ideations. Patient denies both auditory and visual hallucinations. There is no evidence of delusional thought content and no indication that patient is responding to internal stimuli. Patient denies symptoms of paranoia.  Patient gives verbal consent to speak with a close friend who is also babysitting his dog while he is here. Attempted to call patient's friend, Fanny Dance phone number [phone]. HIPAA compliant voicemail left.   Stay Summary:  Per admission H&P: Patient is a 64 year old male that presented to the [facility] voluntarily via Event organiser.  Patient then contacted the [facility] on the crisis line and spoke to on the TTS counselor.  The patient was stating that he was having burning sensation in his throat and that he had been clean from pain pills until today and made comments about having bottles of Valium he was considering taking and also mentioned having a gun.  Patient then hung up on the TTS counselor and the police were contacted to go do a safety check on the patient.  Patient regard and today is continued to report similar events as he is previously admissions here.  Patient does report a long history of substance abuse along with anxiety.  Patient reports he started abusing drugs of 64 years old and finally got clean when he was 64 years old.  He states since then he is been on methadone and got clean, Suboxone and got clean, however, Valium has been his drug of choice to use to help maintain his anxiety and I will prescribe him any more because of his  history of substance abuse.  He states that he understands that.  He reports that over the years he has been on Valium, Ativan, Cymbalta, Suboxone, BuSpar, gabapentin, Abilify, and Remeron.   Patient reports that he does not take his medications as prescribed because he continues reading on a site about medications that are safe to take with benzodiazepines and so he has been tapering himself down off the Cymbalta and made a statement that he is down to 33 of the pellets inside of the capsule which she could not clarify but it sounds as though the patient is opening the Cymbalta capsule and counting the pellets that are inside.  The patient states that his symptoms from his anxiety has continued to get worse and he feels that it is due to him not having his value anymore.  He does state and understand that he will not find someone who will prescribe him any Valium anymore and after discussing medications he has an excuse for almost every single medication that is offered to him.  Some he states there is nothing for him's and the other once he reports that there not good medications to take and they are bad for you.  Patient continues to endorse following the instructions of a specific website about using benzodiazepines.  Patient eventually gets to the point of why he came in today.  He states that his symptoms have gotten so worse that he was considering taking his own life and taking his dog life as well.  He states he just cannot deal with it anymore. Patient presents pacing, tangential and pressured speech, reporting poor sleep and disrupted sleep, racing thoughts, severe anxiety, and irritability.  Discussed with the patient about using medications that are appropriate for him that are not controlled and the patient becomes a little irritated with the discussion about the medications and make statements like he should have came here to begin with.  Patient finally agrees to start medications in the process and this could patient also reports that he has been diagnosed with bipolar in the past but he states that he is not have bipolar and they were wrong and he just needed his Valium.  Patient  agrees to start Seroquel 50 mg p.o. nightly, gabapentin 300 mg p.o. 3 times daily, and to continue his Cymbalta 60 mg p.o. daily.  Patient will be admitted to the continuous sedation unit.  Total Time spent with patient: 30 minutes  Past Psychiatric History: Major depression, history of alcohol abuse Past Medical History:  Past Medical History:  Diagnosis Date  . Alcohol abuse   . Allergy   . Bipolar disorder ([place])   . Chronic pain    lower back pain, "that has progressed"  . Depression   . Diverticulosis   . Dyspnea   . Hepatitis C    hx. of"states he no longer has"" is clear".  . History of recreational drug use    "cocaine, marijuana, polysubstance" quit 1987.  Marland Kitchen HLD (hyperlipidemia)   . Hypertension   . Pancreatitis 2015  . Panic attack   . Sleep apnea    no cpap used,still has machine(use rarely)    Past Surgical History:  Procedure Laterality Date  . CARDIAC CATHETERIZATION  2003   No CAD  . EUS N/A 06/23/2014   Procedure: UPPER ENDOSCOPIC ULTRASOUND (EUS) LINEAR;  Surgeon: Milus Banister, MD;  Location: [facility];  Service: Endoscopy;  Laterality: N/A;  . sinus surgery    . TONSILLECTOMY  Family History:  Family History  Problem Relation Age of Onset  . Heart disease Mother   . Kidney disease Mother   . COPD Mother   . Lupus Mother   . Heart disease Father   . Breast cancer Maternal Grandmother   . Diabetes Maternal Grandfather    Family Psychiatric History: None reported Social History:  Social History   Substance and Sexual Activity  Alcohol Use No  . Alcohol/week: 0.0 standard drinks   Comment: Quit in 1987     Social History   Substance and Sexual Activity  Drug Use No   Comment: Use to take cocaine, marijuana, per patient everything. Stopped in 1987.    Social History   Socioeconomic History  . Marital status: Divorced    Spouse name: Not on file  . Number of children: 0  . Years of education: GED  . Highest education level: Not  on file  Occupational History  . Occupation: DISABLED  . Occupation:    Tobacco Use  . Smoking status: Former Smoker    Packs/day: 2.00    Years: 16.00    Pack years: 32.00    Types: Cigarettes    Quit date: 04/08/2002    Years since quitting: 17.9  . Smokeless tobacco: Never Used  Vaping Use  . Vaping Use: Never used  Substance and Sexual Activity  . Alcohol use: No    Alcohol/week: 0.0 standard drinks    Comment: Quit in 1987  . Drug use: No    Comment: Use to take cocaine, marijuana, per patient everything. Stopped in 1987.  Marland Kitchen Sexual activity: Not Currently    Partners: Female  Other Topics Concern  . Not on file  Social History Narrative   Patient lives at home alone.    Disabled.   Education GED    Both handed.   Caffeine two cups of coffee daily.      Social Determinants of Health   Financial Resource Strain: Not on file  Food Insecurity: Not on file  Transportation Needs: Not on file  Physical Activity: Not on file  Stress: Not on file  Social Connections: Not on file   SDOH:  SDOH Screenings   Alcohol Screen: Not on file  Depression (PHQ2-9): Medium Risk  . PHQ-2 Score: 24  Financial Resource Strain: Not on file  Food Insecurity: Not on file  Housing: Not on file  Physical Activity: Not on file  Social Connections: Not on file  Stress: Not on file  Tobacco Use: Medium Risk  . Smoking Tobacco Use: Former Smoker  . Smokeless Tobacco Use: Never Used  Transportation Needs: Not on file    Has this patient used any form of tobacco in the last 30 days? (Cigarettes, Smokeless Tobacco, Cigars, and/or Pipes) A prescription for an FDA-approved tobacco cessation medication was offered at discharge and the patient refused  Current Medications:  Current Facility-Administered Medications  Medication Dose Route Frequency Provider Last Rate Last Admin  . acetaminophen (TYLENOL) tablet 650 mg  650 mg Oral Q6H PRN Money, Lowry Ram, FNP      . alum & mag  hydroxide-simeth (MAALOX/MYLANTA) 200-200-20 MG/5ML suspension 30 mL  30 mL Oral Q4H PRN Money, Lowry Ram, FNP      . DULoxetine (CYMBALTA) DR capsule 60 mg  60 mg Oral Once Money, Darnelle Maffucci B, FNP      . gabapentin (NEURONTIN) capsule 300 mg  300 mg Oral TID Money, Lowry Ram, FNP      .  haloperidol (HALDOL) tablet 5 mg  5 mg Oral Q6H PRN Money, Darnelle Maffucci B, FNP      . magnesium hydroxide (MILK OF MAGNESIA) suspension 30 mL  30 mL Oral Daily PRN Money, Lowry Ram, FNP      . menthol-cetylpyridinium (CEPACOL) lozenge 3 mg  1 lozenge Oral Once Caroline Sauger, NP      . mirtazapine (REMERON) tablet 7.5 mg  7.5 mg Oral QHS Caroline Sauger, NP   7.5 mg at 03/24/20 2125  . QUEtiapine (SEROQUEL) tablet 50 mg  50 mg Oral QHS Money, Travis B, FNP      . sucralfate (CARAFATE) tablet 1 g  1 g Oral Once Money, Lowry Ram, FNP       Current Outpatient Medications  Medication Sig Dispense Refill  . DULoxetine (CYMBALTA) 20 MG capsule Take 20 mg by mouth daily.    . Melatonin 10 MG TBDP Take 5 mg by mouth at bedtime as needed.    . mirtazapine (REMERON) 7.5 MG tablet Take 1 tablet (7.5 mg total) by mouth at bedtime. 30 tablet 0    PTA Medications: (Not in a hospital admission)   Musculoskeletal  Strength & Muscle Tone: within normal limits Gait & Station: normal Patient leans: N/A  Psychiatric Specialty Exam  Presentation  General Appearance: Appropriate for Environment; Casual  Eye Contact:Good  Speech:Clear and Coherent; Normal Rate  Speech Volume:Normal  Handedness:Right   Mood and Affect  Mood:Anxious  Affect:Appropriate; Congruent   Thought Process  Thought Processes:Coherent; Goal Directed  Descriptions of Associations:Intact  Orientation:Full (Time, Place and Person)  Thought Content:Logical  Hallucinations:Hallucinations: None  Ideas of Reference:None  Suicidal Thoughts:Suicidal Thoughts: No SI Passive Intent and/or Plan: Without Intent; Without Plan  Homicidal  Thoughts:Homicidal Thoughts: No   Sensorium  Memory:Immediate Good; Recent Good; Remote Good  Judgment:Fair  Insight:Fair   Executive Functions  Concentration:Good  Attention Span:Good  Recall:Good  Fund of Knowledge:Good  Language:Good   Psychomotor Activity  Psychomotor Activity:Psychomotor Activity: Restlessness   Assets  Assets:Communication Skills; Desire for Improvement; Financial Resources/Insurance; Housing; Intimacy; Leisure Time; Physical Health; Resilience; Social Support; Talents/Skills   Sleep  Sleep:Sleep: Fair   Physical Exam  Physical Exam Vitals and nursing note reviewed.  Constitutional:      Appearance: He is well-developed.  HENT:     Head: Normocephalic.  Cardiovascular:     Rate and Rhythm: Normal rate.  Pulmonary:     Effort: Pulmonary effort is normal.  Neurological:     Mental Status: He is alert and oriented to person, place, and time.  Psychiatric:        Attention and Perception: Attention and perception normal.        Mood and Affect: Affect normal. Mood is anxious.        Speech: Speech normal.        Behavior: Behavior normal. Behavior is cooperative.        Thought Content: Thought content normal.        Cognition and Memory: Cognition and memory normal.        Judgment: Judgment normal.    Review of Systems  Constitutional: Negative.   HENT: Negative.   Eyes: Negative.   Respiratory: Negative.   Cardiovascular: Negative.   Gastrointestinal: Negative.   Genitourinary: Negative.   Musculoskeletal: Negative.   Skin: Negative.   Neurological: Negative.   Endo/Heme/Allergies: Negative.   Psychiatric/Behavioral: The patient is nervous/anxious.    Blood pressure 113/82, pulse 70, temperature 98 F (36.7 C), temperature source Temporal,  resp. rate 18, SpO2 98 %. There is no height or weight on file to calculate BMI.  Demographic Factors:  Male, Caucasian and Living alone  Loss Factors: NA  Historical  Factors: NA  Risk Reduction Factors:   Religious beliefs about death, Positive social support, Positive therapeutic relationship and Positive coping skills or problem solving skills  Continued Clinical Symptoms:  Alcohol/Substance Abuse/Dependencies  Cognitive Features That Contribute To Risk:  None    Suicide Risk:  Minimal: No identifiable suicidal ideation.  Patients presenting with no risk factors but with morbid ruminations; may be classified as minimal risk based on the severity of the depressive symptoms  Plan Of Care/Follow-up recommendations:  Other:  Patient reviewed with Dr. Hampton Abbot.  Follow-up with outpatient psychiatry and counseling, resources provided.  Disposition: Discharge  Emmaline Kluver, FNP 03/25/2020, 11:02 AM

## 2020-03-25 NOTE — ED Notes (Signed)
Lunch given.

## 2020-03-25 NOTE — ED Notes (Signed)
Pt sleeping at present, no distress noted, monitoring for safety. 

## 2020-03-29 ENCOUNTER — Telehealth (HOSPITAL_COMMUNITY): Payer: Self-pay | Admitting: Family Medicine

## 2020-03-29 NOTE — Telephone Encounter (Signed)
Care Management - Follow Up [facility] Discharges   Writer made contact with the patient.  Patient reports that he has not made an appointment with a psychiatrist or therapist.   Patient reports that he is going to follow up with his previous mental health providers.

## 2020-04-11 ENCOUNTER — Encounter (HOSPITAL_COMMUNITY): Payer: Self-pay | Admitting: Emergency Medicine

## 2020-04-11 ENCOUNTER — Emergency Department (HOSPITAL_COMMUNITY)
Admission: EM | Admit: 2020-04-11 | Discharge: 2020-04-11 | Disposition: A | Discharge status: Home / Self Care | Payer: Medicare (Managed Care) | Attending: Emergency Medicine | Admitting: Emergency Medicine

## 2020-04-11 DIAGNOSIS — R443 Hallucinations, unspecified: Secondary | ICD-10-CM | POA: Diagnosis not present

## 2020-04-11 DIAGNOSIS — Z20822 Contact with and (suspected) exposure to covid-19: Secondary | ICD-10-CM | POA: Diagnosis not present

## 2020-04-11 DIAGNOSIS — I10 Essential (primary) hypertension: Secondary | ICD-10-CM | POA: Diagnosis not present

## 2020-04-11 DIAGNOSIS — R45851 Suicidal ideations: Secondary | ICD-10-CM | POA: Diagnosis present

## 2020-04-11 DIAGNOSIS — F419 Anxiety disorder, unspecified: Secondary | ICD-10-CM | POA: Insufficient documentation

## 2020-04-11 DIAGNOSIS — Z87891 Personal history of nicotine dependence: Secondary | ICD-10-CM | POA: Insufficient documentation

## 2020-04-11 LAB — CBC WITH DIFFERENTIAL/PLATELET
Abs Immature Granulocytes: 0.01 10*3/uL (ref 0.00–0.07)
Basophils Absolute: 0 10*3/uL (ref 0.0–0.1)
Basophils Relative: 1 %
Eosinophils Absolute: 0 10*3/uL (ref 0.0–0.5)
Eosinophils Relative: 1 %
HCT: 45.8 % (ref 39.0–52.0)
Hemoglobin: 15.5 g/dL (ref 13.0–17.0)
Immature Granulocytes: 0 %
Lymphocytes Relative: 25 %
Lymphs Abs: 1.8 10*3/uL (ref 0.7–4.0)
MCH: 27.5 pg (ref 26.0–34.0)
MCHC: 33.8 g/dL (ref 30.0–36.0)
MCV: 81.2 fL (ref 80.0–100.0)
Monocytes Absolute: 0.7 10*3/uL (ref 0.1–1.0)
Monocytes Relative: 10 %
Neutro Abs: 4.6 10*3/uL (ref 1.7–7.7)
Neutrophils Relative %: 63 %
Platelets: 259 10*3/uL (ref 150–400)
RBC: 5.64 MIL/uL (ref 4.22–5.81)
RDW: 13.3 % (ref 11.5–15.5)
WBC: 7.2 10*3/uL (ref 4.0–10.5)
nRBC: 0 % (ref 0.0–0.2)

## 2020-04-11 LAB — COMPREHENSIVE METABOLIC PANEL
ALT: 21 U/L (ref 0–44)
AST: 21 U/L (ref 15–41)
Albumin: 4.6 g/dL (ref 3.5–5.0)
Alkaline Phosphatase: 66 U/L (ref 38–126)
Anion gap: 11 (ref 5–15)
BUN: 24 mg/dL — ABNORMAL HIGH (ref 8–23)
CO2: 23 mmol/L (ref 22–32)
Calcium: 9.7 mg/dL (ref 8.9–10.3)
Chloride: 104 mmol/L (ref 98–111)
Creatinine, Ser: 0.79 mg/dL (ref 0.61–1.24)
GFR, Estimated: >60 mL/min (ref >60)
Glucose, Bld: 106 mg/dL — ABNORMAL HIGH (ref 70–99)
Potassium: 4 mmol/L (ref 3.5–5.1)
Sodium: 138 mmol/L (ref 135–145)
Total Bilirubin: 0.8 mg/dL (ref 0.3–1.2)
Total Protein: 7.6 g/dL (ref 6.5–8.1)

## 2020-04-11 LAB — RAPID URINE DRUG SCREEN, HOSP PERFORMED
Amphetamines: NOT DETECTED
Barbiturates: NOT DETECTED
Benzodiazepines: NOT DETECTED
Cocaine: NOT DETECTED
Opiates: NOT DETECTED
Tetrahydrocannabinol: NOT DETECTED

## 2020-04-11 LAB — RESP PANEL BY RT-PCR (FLU A&B, COVID) ARPGX2
Influenza A by PCR: NEGATIVE
Influenza B by PCR: NEGATIVE
SARS Coronavirus 2 by RT PCR: NEGATIVE

## 2020-04-11 LAB — ETHANOL: Alcohol, Ethyl (B): <10 mg/dL (ref <10)

## 2020-04-11 NOTE — ED Provider Notes (Signed)
[facility]-EMERGENCY DEPT Provider Note   CSN: [id number] Arrival date & time: 04/11/20  1025     History Chief Complaint  Patient presents with  . Anxiety  . Suicidal    Everlene FarrierKyle Will is a 65 y.o. male.  The history is provided by the patient and medical records. No language interpreter was used.  Mental Health Problem Presenting symptoms: agitation, depression, hallucinations and suicidal thoughts   Presenting symptoms: no homicidal ideas and no suicidal threats   Degree of incapacity (severity):  Severe Onset quality:  Gradual Duration:  3 weeks Timing:  Constant Progression:  Worsening Chronicity:  Recurrent Treatment compliance:  Untreated Worsened by:  Nothing Ineffective treatments:  None tried Associated symptoms: anxiety and irritability   Associated symptoms: no abdominal pain, no appetite change, no chest pain, no fatigue and no headaches   Risk factors: hx of mental illness and hx of suicide attempts        Past Medical History:  Diagnosis Date  . Alcohol abuse   . Allergy   . Bipolar disorder (HCC)   . Chronic pain    lower back pain, "that has progressed"  . Depression   . Diverticulosis   . Dyspnea   . Hepatitis C    hx. of"states he no longer has"" is clear".  . History of recreational drug use    "cocaine, marijuana, polysubstance" quit 1987.  Marland Kitchen. HLD (hyperlipidemia)   . Hypertension   . Pancreatitis 2015  . Panic attack   . Sleep apnea    no cpap used,still has machine(use rarely)    Patient Active Problem List   Diagnosis Date Noted  . Generalized anxiety disorder 10/13/2019  . Trouble swallowing 08/19/2019  . Depression 08/18/2019  . Groin pain, left 05/20/2018  . Abdominal pain, chronic, epigastric 11/30/2014  . Chronic low back pain 10/17/2014  . Disc degeneration, lumbar 10/17/2014  . HLD (hyperlipidemia) 08/25/2014  . Chest pain on exertion 09/07/2012  . Memory problem 12/25/2011  . GERD (gastroesophageal  reflux disease) 12/18/2010  . OSA on CPAP 09/20/2009  . HYPERTENSION, BENIGN - Diet Controlled 10/19/2008  . HEPATITIS C 06/05/2006  . DEPRESSION, MAJOR, RECURRENT 06/05/2006  . BIPOLAR DISORDER 06/05/2006  . Anxiety 06/05/2006  . Hx of Alcohol abuse 06/05/2006    Past Surgical History:  Procedure Laterality Date  . CARDIAC CATHETERIZATION  2003   No CAD  . EUS N/A 06/23/2014   Procedure: UPPER ENDOSCOPIC ULTRASOUND (EUS) LINEAR;  Surgeon: Rachael Feeaniel P Jacobs, MD;  Location: [facility];  Service: Endoscopy;  Laterality: N/A;  . sinus surgery    . TONSILLECTOMY         Family History  Problem Relation Age of Onset  . Heart disease Mother   . Kidney disease Mother   . COPD Mother   . Lupus Mother   . Heart disease Father   . Breast cancer Maternal Grandmother   . Diabetes Maternal Grandfather     Social History   Tobacco Use  . Smoking status: Former Smoker    Packs/day: 2.00    Years: 16.00    Pack years: 32.00    Types: Cigarettes    Quit date: 04/08/2002    Years since quitting: 18.0  . Smokeless tobacco: Never Used  Vaping Use  . Vaping Use: Never used  Substance Use Topics  . Alcohol use: No    Alcohol/week: 0.0 standard drinks    Comment: Quit in 1987  . Drug use: No  Comment: Use to take cocaine, marijuana, per patient everything. Stopped in 1987.    Home Medications Prior to Admission medications   Medication Sig Start Date End Date Taking? Authorizing Provider  acetaminophen (TYLENOL) 325 MG tablet Take 2 tablets (650 mg total) by mouth every 6 (six) hours as needed for mild pain. 03/25/20   Emmaline Kluver, FNP  alum & mag hydroxide-simeth (MAALOX/MYLANTA) 200-200-20 MG/5ML suspension Take 30 mLs by mouth every 4 (four) hours as needed for indigestion. 03/25/20   Emmaline Kluver, FNP  DULoxetine (CYMBALTA) 60 MG capsule Take 1 capsule (60 mg total) by mouth daily. 03/25/20   Emmaline Kluver, FNP  gabapentin (NEURONTIN) 300 MG capsule Take 1 capsule (300 mg  total) by mouth 3 (three) times daily. 03/25/20   Emmaline Kluver, FNP  haloperidol (HALDOL) 5 MG tablet Take 1 tablet (5 mg total) by mouth every 6 (six) hours as needed for agitation (severe anxiety). 03/25/20   Emmaline Kluver, FNP  magnesium hydroxide (MILK OF MAGNESIA) 400 MG/5ML suspension Take 30 mLs by mouth daily as needed for mild constipation. 03/25/20   Emmaline Kluver, FNP  mirtazapine (REMERON) 7.5 MG tablet Take 1 tablet (7.5 mg total) by mouth at bedtime. 12/15/19   Connye Burkitt, NP  QUEtiapine (SEROQUEL) 50 MG tablet Take 1 tablet (50 mg total) by mouth at bedtime. 03/25/20   Emmaline Kluver, FNP  sucralfate (CARAFATE) 1 g tablet Take 1 tablet (1 g total) by mouth once for 1 dose. 03/25/20 03/25/20  Emmaline Kluver, FNP    Allergies    Morphine and related  Review of Systems   Review of Systems  Constitutional: Positive for irritability. Negative for appetite change, chills, diaphoresis, fatigue and fever.  HENT: Negative for congestion.   Eyes: Negative for visual disturbance.  Respiratory: Negative for cough, chest tightness, shortness of breath and wheezing.   Cardiovascular: Negative for chest pain, palpitations and leg swelling.  Gastrointestinal: Negative for abdominal pain, constipation, diarrhea, nausea and vomiting.  Genitourinary: Negative for flank pain.  Musculoskeletal: Positive for back pain (chronic). Negative for neck pain and neck stiffness.  Neurological: Negative for headaches.  Psychiatric/Behavioral: Positive for agitation, hallucinations and suicidal ideas. Negative for confusion and homicidal ideas. The patient is nervous/anxious.   All other systems reviewed and are negative.   Physical Exam Updated Vital Signs BP 136/78 (BP Location: Right Arm)   Pulse 80   Temp 97.8 F (36.6 C) (Oral)   Resp 20   SpO2 94%   Physical Exam Vitals and nursing note reviewed.  Constitutional:      General: He is not in acute distress.    Appearance: He is well-developed  and well-nourished. He is not ill-appearing, toxic-appearing or diaphoretic.  HENT:     Head: Normocephalic and atraumatic.     Nose: Nose normal.     Mouth/Throat:     Mouth: Mucous membranes are moist.     Pharynx: No oropharyngeal exudate or posterior oropharyngeal erythema.  Eyes:     Extraocular Movements: Extraocular movements intact.     Conjunctiva/sclera: Conjunctivae normal.     Pupils: Pupils are equal, round, and reactive to light.  Cardiovascular:     Rate and Rhythm: Normal rate and regular rhythm.     Pulses: Normal pulses.     Heart sounds: No murmur heard.   Pulmonary:     Effort: Pulmonary effort is normal. No respiratory distress.     Breath sounds: Normal breath  sounds. No wheezing, rhonchi or rales.  Chest:     Chest wall: No tenderness.  Abdominal:     General: Abdomen is flat.     Palpations: Abdomen is soft.     Tenderness: There is no abdominal tenderness. There is no right CVA tenderness, left CVA tenderness, guarding or rebound.  Musculoskeletal:        General: No tenderness or edema.     Cervical back: Neck supple. No tenderness.  Skin:    General: Skin is warm and dry.  Neurological:     General: No focal deficit present.     Mental Status: He is alert and oriented to person, place, and time.     Sensory: No sensory deficit.     Motor: No weakness.  Psychiatric:        Attention and Perception: He perceives auditory and visual hallucinations.        Mood and Affect: Mood is anxious and depressed.        Behavior: Behavior is not aggressive.        Thought Content: Thought content is not delusional. Thought content includes suicidal ideation. Thought content does not include homicidal ideation. Thought content does not include homicidal or suicidal plan.     ED Results / Procedures / Treatments   Labs (all labs ordered are listed, but only abnormal results are displayed) Labs Reviewed  COMPREHENSIVE METABOLIC PANEL - Abnormal; Notable for  the following components:      Result Value   Glucose, Bld 106 (*)    BUN 24 (*)    All other components within normal limits  RESP PANEL BY RT-PCR (FLU A&B, COVID) [id number]  ETHANOL  RAPID URINE DRUG SCREEN, HOSP PERFORMED  CBC WITH DIFFERENTIAL/PLATELET    EKG EKG Interpretation  Date/Time:  Tuesday April 11 2020 11:25:57 EST Ventricular Rate:  84 PR Interval:    QRS Duration: 97 QT Interval:  375 QTC Calculation: 444 R Axis:   140 Text Interpretation: Sinus rhythm Left posterior fascicular block Abnormal R-wave progression, late transition 12 Lead; Mason-Likar When comapred to prior, similar appearence. No STEMI Confirmed by Antony Blackbird [id number]) on 04/11/2020 12:14:02 PM   Radiology No results found.  Procedures Procedures (including critical care time)  Medications Ordered in ED Medications - No data to display  ED Course  I have reviewed the triage vital signs and the nursing notes.  Pertinent labs & imaging results that were available during my care of the patient were reviewed by me and considered in my medical decision making (see chart for details).    MDM Rules/Calculators/A&P                          Perris Amjad is a 65 y.o. male with a past medical history significant for depression, bipolar disorder, severe anxiety, hepatitis C, GERD, hypertension, degenerative disc disease with chronic back pain, hyperlipidemia, and prior withdrawal troubles who presents with concern for continued withdrawal symptoms, anxiety, depression, a "buzzing feeling" all over, occasional suicidal thoughts, and now more audiovisual hallucinations.  He reports he is seen shapes that are not there and has been hearing things that have been worsening.  He reports he is having worsening withdrawal troubles.  He reports he was admitted several weeks ago to behavioral health continuous sedation observation unit and reports he thought he was starting to get better but then continued to worsen.   He reports he has not used any more  medications he was not prescribed.  He reports that things are worsening and now he is having difficulty taking care of himself at home because of the pacing, stress, anxiety, and now the occasional suicidal thoughts.  Does report he tried to overdose in the past.  He is not have a plan currently.  He denies homicidal ideation.  He reports he is having the worsening withdrawals and now having some hallucinations.  He denies any new symptoms aside from this and reports this is all likely related to his withdrawal he says.  He denies any fevers, chills, chest pain, shortness breath, cough.  No Covid symptoms reported.  No urinary or GI symptoms.  He reports he is having a buzzing feeling all over which she gets with his withdrawal.  He reports chronic back pains.  He is pacing around the room during our initial conversation.  On exam, lungs are clear and chest is nontender.  Abdomen is nontender.  He is moving all extremities and has a normal gait.  He is clear with his speech but it is rapid.  He is very concerned about his withdrawals and how he is feeling.  He reports that all the medicines have not been working and seems to making it worse including these new hallucinations.  Clinically I do suspect he is doing worse.  I am not sure if this is withdrawal as he has not taken any medicine for quite some time however, his medicine regimen is clearly not working.  He reports he stopped all his medicine over the last several days with all this worse including hallucinations.  He does report occasional suicidal thoughts and given his history of suicide attempt, I am concerned about this.  Patient presents to get some help.  We will get screening labs for medical clearance and then have TTS evaluate the patient see if he needs admission again for further management.      3:51 PM Patient's labs returned and they are all similar to prior and reassuring.  He is Covid and flu  negative at this time.  He is now medically cleared for TTS evaluation and psychiatric management for his new hallucinations, anxiety and depression, and the coming ongoing suicidal thoughts with a history of suicide attempt and recent admission.  He is voluntary at this time as he is denying a plan of suicide and denies any homicidal ideation at this time    Final Clinical Impression(s) / ED Diagnoses Final diagnoses:  Suicidal ideation  Hallucinations  Anxiety    Clinical Impression: 1. Suicidal ideation   2. Hallucinations   3. Anxiety     Disposition: Awaiting TTS recommendations.  This note was prepared with assistance of Conservation officer, historic buildings. Occasional wrong-word or sound-a-like substitutions may have occurred due to the inherent limitations of voice recognition software.      Rawlins Stuard, Canary Brim, MD 04/11/20 [phone]

## 2020-04-11 NOTE — ED Triage Notes (Signed)
Per patient, states he has been withdrawing form benzo's since July-states he can't leave his house-has thoughts of suicide on a daily basis but does not act on his thoughts-increased anxiety and depression

## 2020-04-11 NOTE — ED Notes (Signed)
Pt eloped.

## 2020-04-11 NOTE — ED Notes (Signed)
Pt came out of room to ask what he was waiting for. It was explained to pt that he was awaiting to talk to a provider and that he just needed to be a little patient. Pt voiced that he understood.

## 2020-04-18 ENCOUNTER — Telehealth: Payer: Medicare (Managed Care) | Admitting: Internal Medicine

## 2020-04-19 ENCOUNTER — Telehealth (INDEPENDENT_AMBULATORY_CARE_PROVIDER_SITE_OTHER): Payer: Medicare (Managed Care) | Admitting: Internal Medicine

## 2020-04-19 ENCOUNTER — Encounter: Payer: Self-pay | Admitting: Internal Medicine

## 2020-04-19 ENCOUNTER — Other Ambulatory Visit: Payer: Self-pay

## 2020-04-19 NOTE — Progress Notes (Signed)
Was taking diazepam, has been taking off of it and having sore throat/withdrawal symptoms, requesting Remeron refill to help with this/sleep/anxiety

## 2020-04-19 NOTE — Progress Notes (Signed)
Virtual Visit via Telephone Note  I connected with [name], on 04/19/2020 at 10:39 AM by telephone due to the COVID-19 pandemic and verified that I am speaking with the correct person using two identifiers.   Patient has been a long time patient at the [facility]. Appears there was some confusion as he thought he was having virtual f/u with one of their providers and did not plan to switch clinics. Discussed with patient that I used to provide care at this clinic but am no longer affiliated with the [facility]. Patient reports difficulty coordinating care for appointments for himself but does not intend to change practice locations. Therefore, called [facility] and spoke to [place], CMA who took patient's name and DOB. She will have someone call patient to set up a follow up visit.   Phill Myron, D.O. Primary Care at [facility]  04/19/2020, 10:39 AM

## 2020-05-09 NOTE — Progress Notes (Signed)
Patient was scheduled with PCP 05/10/2020 for "medication management".  Patient unfortunately no showed this appointment.  Upon chart review, patient has also had no showed/late canceled appointments on 02/10/2019 and 10/07/2019.  Patient sent letter today regarding no-show to appointment and no-show policy.  Milus Banister, [place], PGY-3 05/10/2020 5:27 PM

## 2020-05-10 ENCOUNTER — Ambulatory Visit (INDEPENDENT_AMBULATORY_CARE_PROVIDER_SITE_OTHER): Payer: Medicare (Managed Care) | Admitting: Family Medicine

## 2020-05-10 DIAGNOSIS — Z91199 Patient's noncompliance with other medical treatment and regimen due to unspecified reason: Secondary | ICD-10-CM | POA: Insufficient documentation

## 2020-05-10 DIAGNOSIS — Z5329 Procedure and treatment not carried out because of patient's decision for other reasons: Secondary | ICD-10-CM

## 2020-06-26 ENCOUNTER — Other Ambulatory Visit (HOSPITAL_COMMUNITY): Payer: Self-pay | Admitting: Psychiatry

## 2020-06-26 DIAGNOSIS — F411 Generalized anxiety disorder: Secondary | ICD-10-CM

## 2020-07-12 ENCOUNTER — Encounter (HOSPITAL_COMMUNITY): Payer: Self-pay | Admitting: Psychiatry

## 2020-07-12 ENCOUNTER — Ambulatory Visit (HOSPITAL_COMMUNITY)
Admission: EM | Admit: 2020-07-12 | Discharge: 2020-07-12 | Disposition: A | Discharge status: Home / Self Care | Payer: Medicare (Managed Care)

## 2020-07-12 ENCOUNTER — Telehealth (INDEPENDENT_AMBULATORY_CARE_PROVIDER_SITE_OTHER): Payer: Medicare (Managed Care) | Admitting: Psychiatry

## 2020-07-12 ENCOUNTER — Other Ambulatory Visit: Payer: Self-pay

## 2020-07-12 DIAGNOSIS — F411 Generalized anxiety disorder: Secondary | ICD-10-CM

## 2020-07-12 MED ORDER — MIRTAZAPINE 30 MG PO TBDP: 30.0000 mg | ORAL_TABLET | Freq: Every day | ORAL | 2 refills | Status: DC

## 2020-07-12 NOTE — Progress Notes (Signed)
[place] MD/PA/NP OP Progress Note Virtual Visit via Telephone Note  I connected with [name] on 07/12/20 at 10:00 AM EDT by telephone and verified that I am speaking with the correct person using two identifiers.  Location: Patient: home Provider: Clinic   I discussed the limitations, risks, security and privacy concerns of performing an evaluation and management service by telephone and the availability of in person appointments. I also discussed with the patient that there may be a patient responsible charge related to this service. The patient expressed understanding and agreed to proceed.   I provided 30 minutes of non-face-to-face time during this encounter.   07/12/2020 1:03 PM [name]  MRN:  [id number]  Chief Complaint: I'm having severe chest pain, stomach issues, and burning"  HPI: 65 year old male seen today for follow up psychiatric evaluation.   He has a psychiatric history of Bipolar disorder, depression, anxiety, and panic disorder.  He informed Probation officer that he has only been taking a partial does of Cymbalta. He also notes that he continues taking Mirtazapine 15 mg daily.  Today patient was unable to login virtually so his assessment was done over the phone. During exam he was talkative and somewhat engaged in conversation. His speech was pressured and rapid. He informed provider that he pulls apart his Cymbalta capsule and take partial palettes as he notes that the full dose is not effective. He informed Probation officer he continue to follow instructions from [email or web address].  Provider informed patient that although websites may be helpful they may not be current or  not best practice guidelines. He notes that he feels that he is still withdrawing form valium and notes that [email or web address] has been the only helpful resource for him.   Patient notes that he has severe chest pain, stomach issues, and burning. He notes that he feels more anxious due to pain. He notes that he took oxycodone  to help manage his pain but reports it was unsuccessful. Provider recommended patient be seen by a PCP however her notes that he is distrustful of doctors because they ask him the same questions over and over again and prescribe him medications that are ineffective. Today he denies misuse of oxycodone, alcohol, or illegal drug use. Today provider conducted a GAD 7 and patient scored a 19. Provider also conducted a PHQ 9 and patient scored a 16. He endorses poor sleep and appetite. Patient also endorses symptoms of hypomania such as racing thoughts, irritability, distractibility, and flucuations in mood.  He denies SI/HI/VAH or paranoia.  Provider recommended patient take Cymbalta as prescribed. He however notes that he wants to discontinue Cymbalta. Provider recommended patient start a mood stabilizer or antipsychotic such as Abilify but patient notes that he did not want to try either. He notes he does not want any medications besides Mirtazapine. Today he is agreeable to increasing Mirtazipine 15 mg to 30 mg to help manage anxiety, depression, and sleep. No other concerns noted at this time. Visit Diagnosis:    ICD-10-CM   1. Generalized anxiety disorder  F41.1 mirtazapine (REMERON SOL-TAB) 30 MG disintegrating tablet    Past Psychiatric History:  Bipolar disorder, depression, anxiety, and panic disorder  Past Medical History:  Past Medical History:  Diagnosis Date  . Alcohol abuse   . Allergy   . Bipolar disorder ([place])   . Chronic pain    lower back pain, "that has progressed"  . Depression   . Diverticulosis   . Dyspnea   . Hepatitis C  hx. of"states he no longer has"" is clear".  . History of recreational drug use    "cocaine, marijuana, polysubstance" quit 1987.  Marland Kitchen HLD (hyperlipidemia)   . Hypertension   . Pancreatitis 2015  . Panic attack   . Sleep apnea    no cpap used,still has machine(use rarely)    Past Surgical History:  Procedure Laterality Date  . CARDIAC  CATHETERIZATION  2003   No CAD  . EUS N/A 06/23/2014   Procedure: UPPER ENDOSCOPIC ULTRASOUND (EUS) LINEAR;  Surgeon: Milus Banister, MD;  Location: [facility];  Service: Endoscopy;  Laterality: N/A;  . sinus surgery    . TONSILLECTOMY      Family Psychiatric History: Unknown  Family History:  Family History  Problem Relation Age of Onset  . Heart disease Mother   . Kidney disease Mother   . COPD Mother   . Lupus Mother   . Heart disease Father   . Breast cancer Maternal Grandmother   . Diabetes Maternal Grandfather     Social History:  Social History   Socioeconomic History  . Marital status: Divorced    Spouse name: Not on file  . Number of children: 0  . Years of education: GED  . Highest education level: Not on file  Occupational History  . Occupation: DISABLED  . Occupation:    Tobacco Use  . Smoking status: Former Smoker    Packs/day: 2.00    Years: 16.00    Pack years: 32.00    Types: Cigarettes    Quit date: 04/08/2002    Years since quitting: 18.2  . Smokeless tobacco: Never Used  Vaping Use  . Vaping Use: Never used  Substance and Sexual Activity  . Alcohol use: No    Alcohol/week: 0.0 standard drinks    Comment: Quit in 1987  . Drug use: No    Comment: Use to take cocaine, marijuana, per patient everything. Stopped in 1987.  Marland Kitchen Sexual activity: Not Currently    Partners: Female  Other Topics Concern  . Not on file  Social History Narrative   Patient lives at home alone.    Disabled.   Education GED    Both handed.   Caffeine two cups of coffee daily.      Social Determinants of Health   Financial Resource Strain: Not on file  Food Insecurity: Not on file  Transportation Needs: Not on file  Physical Activity: Not on file  Stress: Not on file  Social Connections: Not on file    Allergies:  Allergies  Allergen Reactions  . Morphine And Related Shortness Of Breath  . Diazepam Other (See Comments)    Ringing in ears, diarrhea, CNS  issues-multiple reactions     Metabolic Disorder Labs: Lab Results  Component Value Date   HGBA1C 5.5 03/24/2020   MPG 111.15 03/24/2020   MPG 111 09/15/2012   No results found for: PROLACTIN Lab Results  Component Value Date   CHOL 170 03/24/2020   TRIG 44 03/24/2020   HDL 62 03/24/2020   CHOLHDL 2.7 03/24/2020   VLDL 9 03/24/2020   LDLCALC 99 03/24/2020   LDLCALC 74 08/16/2019   Lab Results  Component Value Date   TSH 2.266 03/24/2020   TSH 3.362 12/14/2019    Therapeutic Level Labs: No results found for: LITHIUM No results found for: VALPROATE No components found for:  CBMZ  Current Medications: Current Outpatient Medications  Medication Sig Dispense Refill  . acetaminophen (TYLENOL) 325 MG tablet  Take 2 tablets (650 mg total) by mouth every 6 (six) hours as needed for mild pain. (Patient not taking: Reported on 04/11/2020) 10 tablet   . alum & mag hydroxide-simeth (MAALOX/MYLANTA) 200-200-20 MG/5ML suspension Take 30 mLs by mouth every 4 (four) hours as needed for indigestion. (Patient not taking: Reported on 04/11/2020) 355 mL 0  . gabapentin (NEURONTIN) 300 MG capsule Take 1 capsule (300 mg total) by mouth 3 (three) times daily. (Patient not taking: Reported on 04/11/2020) 90 capsule 0  . haloperidol (HALDOL) 5 MG tablet Take 1 tablet (5 mg total) by mouth every 6 (six) hours as needed for agitation (severe anxiety). (Patient not taking: Reported on 04/11/2020) 30 tablet 0  . magnesium hydroxide (MILK OF MAGNESIA) 400 MG/5ML suspension Take 30 mLs by mouth daily as needed for mild constipation. (Patient not taking: Reported on 04/11/2020) 355 mL 0  . mirtazapine (REMERON SOL-TAB) 30 MG disintegrating tablet Take 1 tablet (30 mg total) by mouth at bedtime. 30 tablet 2  . OVER THE COUNTER MEDICATION Take 1 drop by mouth as needed (anxiety). 1 dropper full of CBD oil (Patient not taking: Reported on 04/19/2020)    . QUEtiapine (SEROQUEL) 50 MG tablet Take 1 tablet (50 mg total) by  mouth at bedtime. (Patient not taking: Reported on 04/11/2020) 30 tablet 0  . sodium chloride (OCEAN) 0.65 % SOLN nasal spray Place 1 spray into both nostrils as needed for congestion. (Patient not taking: Reported on 04/19/2020)    . sucralfate (CARAFATE) 1 g tablet Take 1 tablet (1 g total) by mouth once for 1 dose. (Patient not taking: Reported on 04/11/2020) 1 tablet 0   No current facility-administered medications for this visit.     Musculoskeletal: Strength & Muscle Tone: Unable to assess due to telephone visit Gait & Station: Unable to assess due to telephone visit Patient leans: N/A  Psychiatric Specialty Exam: Review of Systems  There were no vitals taken for this visit.There is no height or weight on file to calculate BMI.  General Appearance: Unable to assess due to telephone visit  Eye Contact:  Unable to assess due to telephone visit  Speech:  Clear and Coherent and Pressured  Volume:  Increased  Mood:  Anxious, Depressed and Irritable  Affect:  Unable to assess due to telephone visit  Thought Process:  Coherent, Goal Directed and Linear  Orientation:  Full (Time, Place, and Person)  Thought Content: WDL and Logical   Suicidal Thoughts:  No  Homicidal Thoughts:  No  Memory:  Immediate;   Good Recent;   Good Remote;   Good  Judgement:  Fair  Insight:  Fair  Psychomotor Activity:  Restlessness and Patient notes that he is restless.   Concentration:  Concentration: Fair and Attention Span: Fair  Recall:  [employer] of Knowledge: Good  Language: Good  Akathisia:  Unable to assess due to telephone visit  Handed:  Right  AIMS (if indicated): Not done  Assets:  Communication Skills Desire for Improvement Housing Leisure Time Social Support  ADL's:  Intact  Cognition: WNL  Sleep:  Fair   Screenings: GAD-7   Flowsheet Row Video Visit from 07/12/2020 in [place] from 04/19/2020 in Primary Care at [facility]  Total GAD-7  Score 19 [place] Visit from 02/28/2014 in [facility]  Total Score (max 30 points ) 29    PHQ2-9   [place] Video Visit  from 07/12/2020 in [place] from 04/19/2020 in Primary Care at [facility] ED from 03/24/2020 in [facility] Office Visit from 09/15/2019 in [place] Office Visit from 08/16/2019 in [place]  PHQ-2 Total Score 4 3 6 6 2   PHQ-9 Total Score 16 12 24 23 13     [place] ED from 03/24/2020 in [facility] ED from 12/14/2019 in [place] Error: Q2 is Yes, you must answer 3, 4, and 5 Low Risk       Assessment and Plan: Patient endorses symptoms of anxiety, depression, and hypomania. Today provider recommended patient starting a mood stabilizer or an antidepressant but patient was not agreeable. He notes that he want to also discontinue his Cymbalta. He is agreeable to increase Mirtazapine 15 mg to 30 mg to help manage anxiety, sleep, and depression.  1. Generalized anxiety disorder  Increased- mirtazapine (REMERON SOL-TAB) 30 MG disintegrating tablet; Take 1 tablet (30 mg total) by mouth at bedtime.  Dispense: 30 tablet; Refill: 2  Follow up in 3 months Follow up with therapy   Salley Slaughter, NP 07/12/2020, 1:03 PM

## 2020-07-12 NOTE — ED Notes (Signed)
Pt changed his mind and left without being seen.

## 2020-07-12 NOTE — Progress Notes (Signed)
TTS Triage: Pt to [facility] voluntarily due to increased anxiety and concerns about somatic symptoms related to medications he was taking. Pt reports taking Valium for 48 years and reports that he stopped taking it 03/2019. Pt reports increased anxiety, difficulty sleeping and restlessness. Pt denies SI/HI/AVH and substance use. Pt initially went to open access but was told he would have to wait until 10am. Pt unwilling to wait and wanted to be seen in urgent care hoping he could be seen earlier.  Pt is routine.

## 2020-07-19 ENCOUNTER — Telehealth (HOSPITAL_COMMUNITY): Payer: Self-pay | Admitting: *Deleted

## 2020-07-19 NOTE — Telephone Encounter (Signed)
Call from patient who asked if I would just listen to him. States he wants [place] to know he is in the shape he is because of Valium use and then after years of being off of it took some in an attempt to kill self in 2020 and has been sick like he is now. Frequent dry cough and complains of pain. States he has educated self on line on a site called "[facility] [employer] [place]" and wants [place] to know about it so we can educated self to him and to others like him. States the site said he could get better with time but not sure he will make it depending on how much time. Very talkative, 15 min long conversation but remained focused. He has an appt tomorrow with therapist and would like her to call him, he has difficutly getting on my chart and it depends on how he feels when he wakes up.

## 2020-07-20 ENCOUNTER — Other Ambulatory Visit: Payer: Self-pay

## 2020-07-20 ENCOUNTER — Ambulatory Visit (INDEPENDENT_AMBULATORY_CARE_PROVIDER_SITE_OTHER): Payer: Medicare (Managed Care) | Admitting: Licensed Clinical Social Worker

## 2020-07-20 DIAGNOSIS — F411 Generalized anxiety disorder: Secondary | ICD-10-CM | POA: Diagnosis not present
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
## 2020-09-29 IMAGING — CR DG CHEST 2V
2 series · 2 of 2 positions shown · non-contrast
Comparison: 03/24/2019

CLINICAL DATA: Chest pain

EXAM:
CHEST - 2 VIEW

[w chest pa]
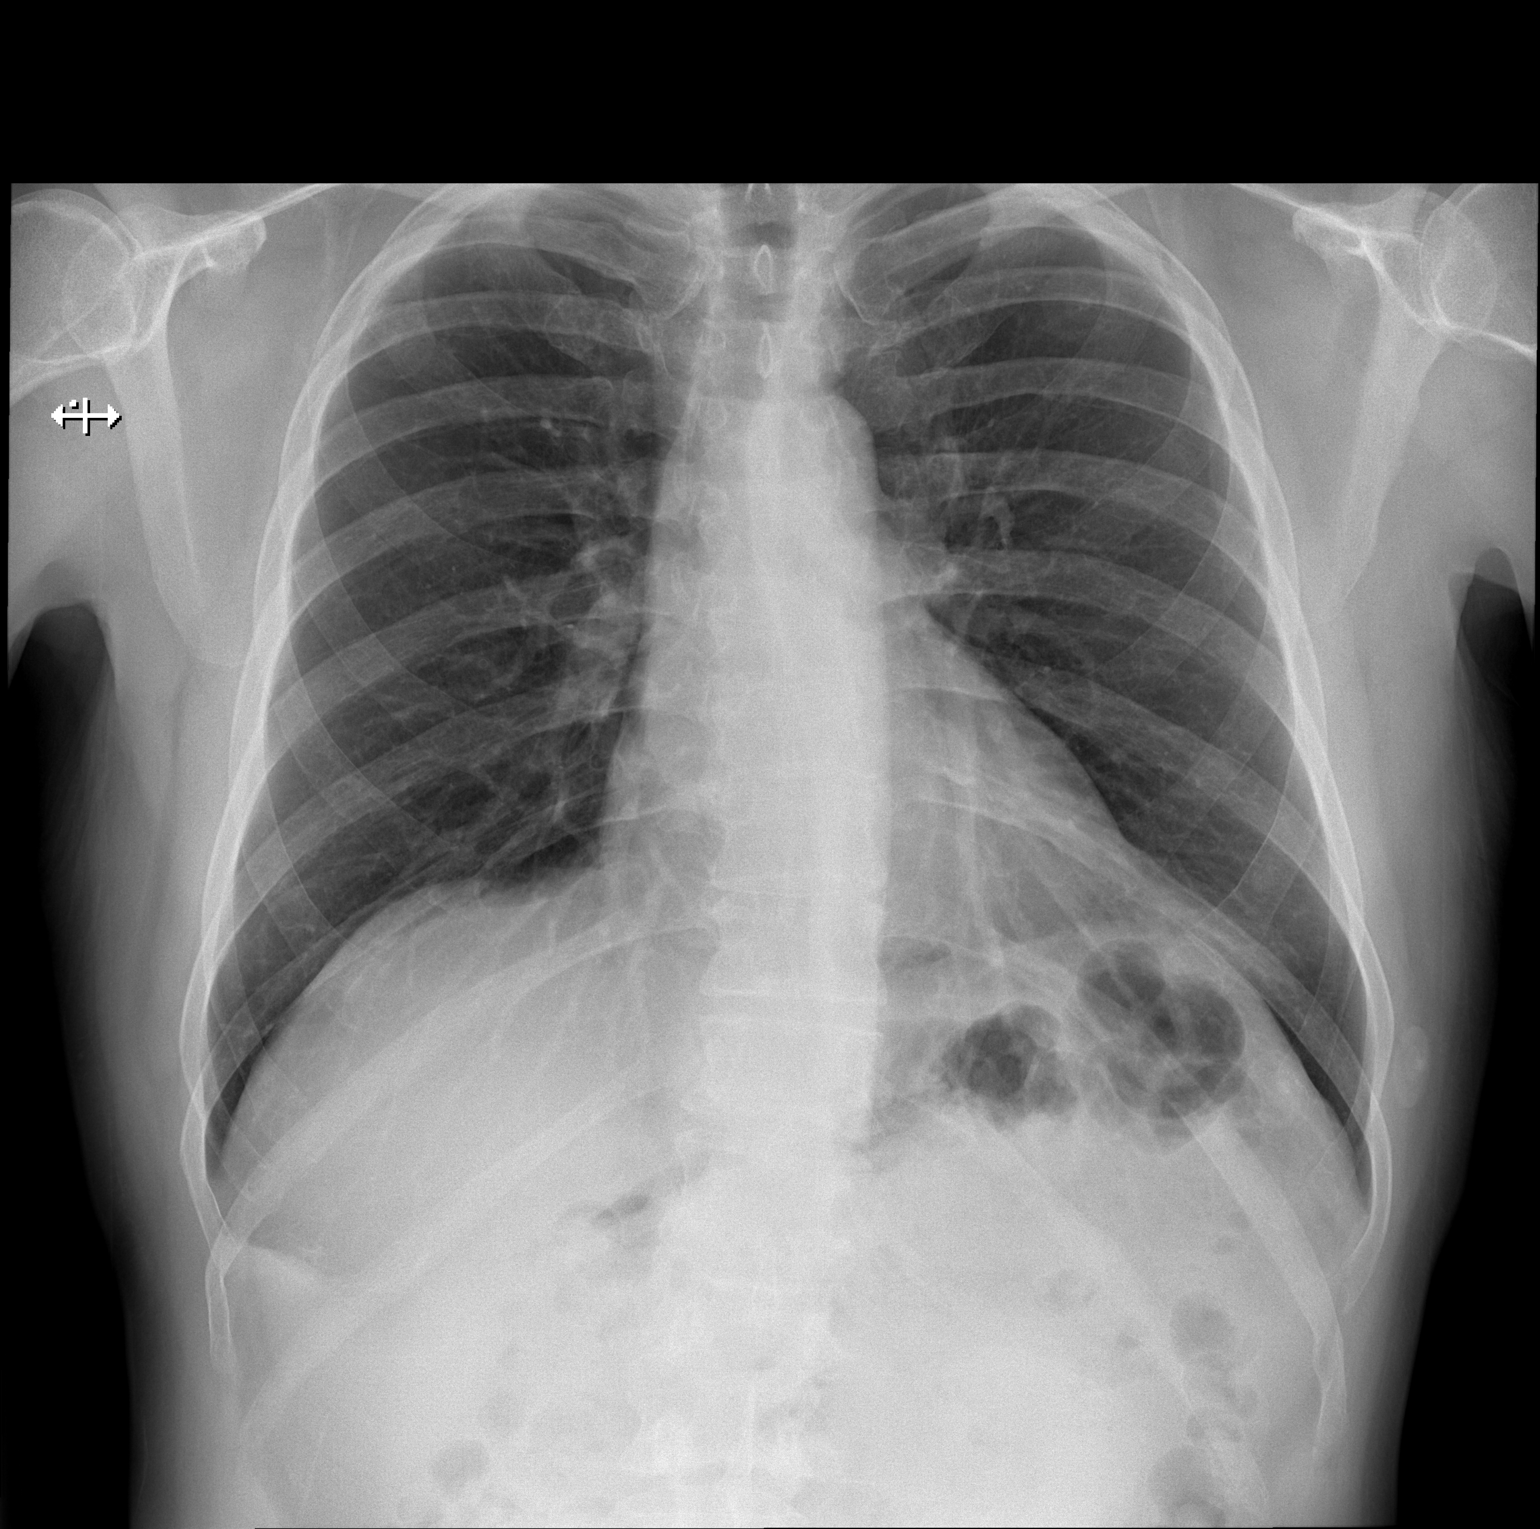

[w chest lat]
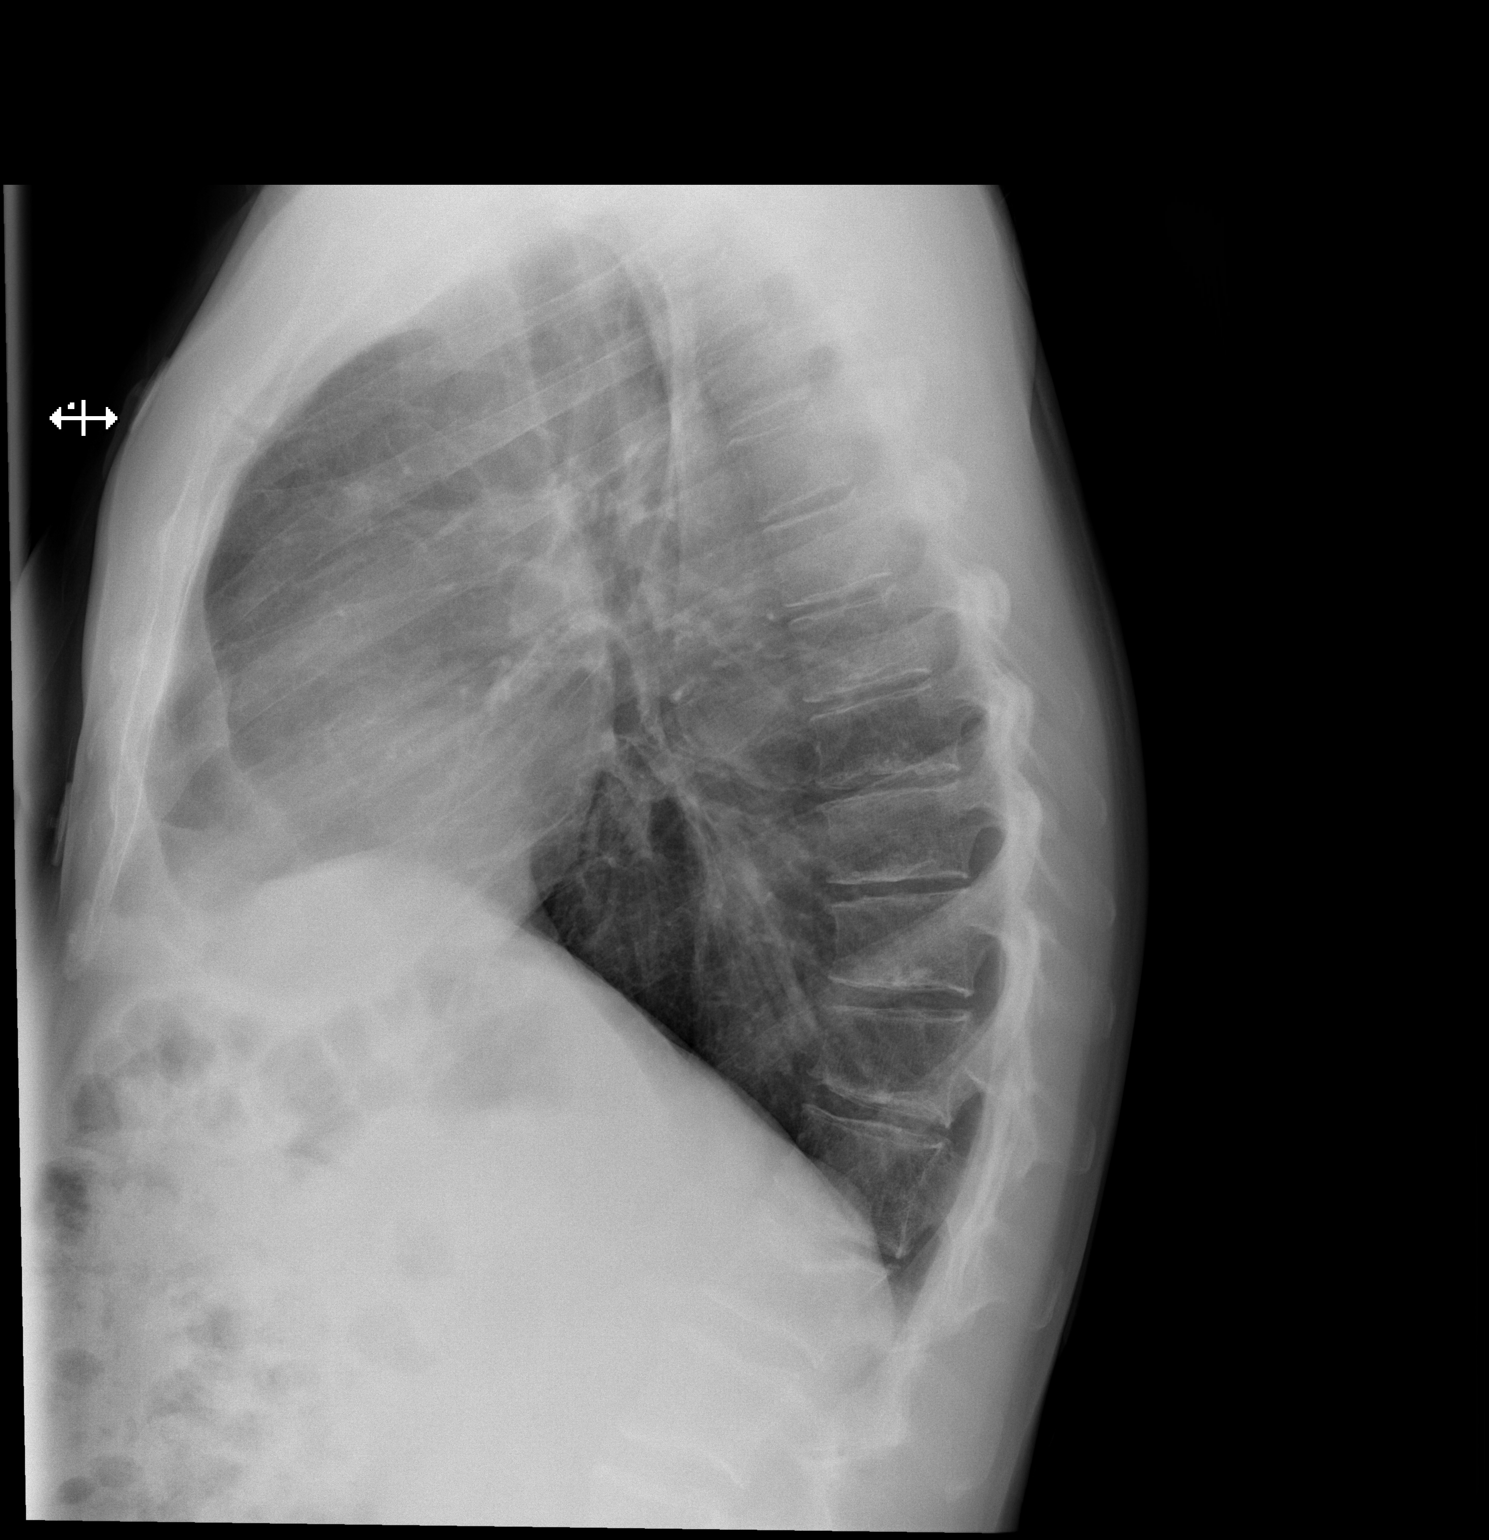

[2 of 2 positions shown; findings below may reference images not displayed]

FINDINGS: The heart size and mediastinal contours are within normal limits.
Both lungs are clear. The visualized skeletal structures are
unremarkable.
IMPRESSION: No acute abnormality of the lungs.

## 2020-10-01 ENCOUNTER — Encounter (HOSPITAL_COMMUNITY): Payer: Self-pay

## 2020-10-01 ENCOUNTER — Emergency Department (HOSPITAL_COMMUNITY): Payer: Medicare (Managed Care)

## 2020-10-01 ENCOUNTER — Other Ambulatory Visit: Payer: Self-pay

## 2020-10-01 ENCOUNTER — Emergency Department (HOSPITAL_COMMUNITY)
Admission: EM | Admit: 2020-10-01 | Discharge: 2020-10-01 | Disposition: A | Discharge status: Home / Self Care | Payer: Medicare (Managed Care) | Attending: Emergency Medicine | Admitting: Emergency Medicine

## 2020-10-01 DIAGNOSIS — Z87891 Personal history of nicotine dependence: Secondary | ICD-10-CM | POA: Diagnosis not present

## 2020-10-01 DIAGNOSIS — R519 Headache, unspecified: Secondary | ICD-10-CM | POA: Diagnosis not present

## 2020-10-01 DIAGNOSIS — I10 Essential (primary) hypertension: Secondary | ICD-10-CM | POA: Diagnosis not present

## 2020-10-01 DIAGNOSIS — F419 Anxiety disorder, unspecified: Secondary | ICD-10-CM | POA: Diagnosis not present

## 2020-10-01 LAB — BASIC METABOLIC PANEL
Anion gap: 8 (ref 5–15)
BUN: 24 mg/dL — ABNORMAL HIGH (ref 8–23)
CO2: 24 mmol/L (ref 22–32)
Calcium: 9.4 mg/dL (ref 8.9–10.3)
Chloride: 107 mmol/L (ref 98–111)
Creatinine, Ser: 1.01 mg/dL (ref 0.61–1.24)
GFR, Estimated: >60 mL/min (ref >60)
Glucose, Bld: 142 mg/dL — ABNORMAL HIGH (ref 70–99)
Potassium: 4.2 mmol/L (ref 3.5–5.1)
Sodium: 139 mmol/L (ref 135–145)

## 2020-10-01 LAB — CBC
HCT: 44 % (ref 39.0–52.0)
Hemoglobin: 14.7 g/dL (ref 13.0–17.0)
MCH: 27 pg (ref 26.0–34.0)
MCHC: 33.4 g/dL (ref 30.0–36.0)
MCV: 80.9 fL (ref 80.0–100.0)
Platelets: 221 10*3/uL (ref 150–400)
RBC: 5.44 MIL/uL (ref 4.22–5.81)
RDW: 13.4 % (ref 11.5–15.5)
WBC: 6.8 10*3/uL (ref 4.0–10.5)
nRBC: 0 % (ref 0.0–0.2)

## 2020-10-01 LAB — TROPONIN I (HIGH SENSITIVITY)
Troponin I (High Sensitivity): <2 ng/L (ref <18)
Troponin I (High Sensitivity): <2 ng/L (ref <18)

## 2020-10-01 NOTE — ED Triage Notes (Signed)
Patient c/o headache and chest pain. Patient states he has burning in his chest and has had for months. Patient repeatedly states that his chest pain is different and more painful. Patient reports a history of withdrawal from Valium.  Patient refused an EKG. Patient states his heart is good and has been checked a lot of times.  Patient has a frequent hacking cough.

## 2020-10-01 NOTE — ED Notes (Signed)
Patient transported to CT 

## 2020-10-01 NOTE — ED Provider Notes (Signed)
[facility] DEPT Provider Note   CSN: [id number] Arrival date & time: 10/01/20  1722     History Chief Complaint  Patient presents with   Headache    Gurkaran Rahm is a 65 y.o. male.  Patient states that he stopped taking Valium 9 months ago and all his symptoms are related to that he has a headache he has been having off-and-on a cough congestion for many weeks he is very anxious.  Patient is only concerned of his headache and does not want anything else evaluated  The history is provided by the patient and the nursing home. No language interpreter was used.  Headache Pain location:  Frontal Quality:  Dull Radiates to:  Does not radiate Severity currently:  4/10 Severity at highest:  6/10 Onset quality:  Gradual Duration: 9 months. Timing:  Constant Progression:  Resolved Chronicity:  Recurrent Similar to prior headaches: no   Context: not activity   Associated symptoms: no abdominal pain, no back pain, no congestion, no cough, no diarrhea, no fatigue, no seizures and no sinus pressure       Past Medical History:  Diagnosis Date   Alcohol abuse    Allergy    Bipolar disorder (HCC)    Chronic pain    lower back pain, "that has progressed"   Depression    Diverticulosis    Dyspnea    Hepatitis C    hx. of"states he no longer has"" is clear".   History of recreational drug use    "cocaine, marijuana, polysubstance" quit 1987.   HLD (hyperlipidemia)    Hypertension    Pancreatitis 2015   Panic attack    Sleep apnea    no cpap used,still has machine(use rarely)    Patient Active Problem List   Diagnosis Date Noted   No-show for appointment 05/10/2020   Generalized anxiety disorder 10/13/2019   Trouble swallowing 08/19/2019   Depression 08/18/2019   Groin pain, left 05/20/2018   Abdominal pain, chronic, epigastric 11/30/2014   Chronic low back pain 10/17/2014   Disc degeneration, lumbar 10/17/2014   HLD (hyperlipidemia)  08/25/2014   Chest pain on exertion 09/07/2012   Memory problem 12/25/2011   GERD (gastroesophageal reflux disease) 12/18/2010   OSA on CPAP 09/20/2009   HYPERTENSION, BENIGN - Diet Controlled 10/19/2008   HEPATITIS C 06/05/2006   DEPRESSION, MAJOR, RECURRENT 06/05/2006   BIPOLAR DISORDER 06/05/2006   Anxiety 06/05/2006   Hx of Alcohol abuse 06/05/2006    Past Surgical History:  Procedure Laterality Date   CARDIAC CATHETERIZATION  2003   No CAD   EUS N/A 06/23/2014   Procedure: UPPER ENDOSCOPIC ULTRASOUND (EUS) LINEAR;  Surgeon: Milus Banister, MD;  Location: [facility];  Service: Endoscopy;  Laterality: N/A;   sinus surgery     TONSILLECTOMY         Family History  Problem Relation Age of Onset   Heart disease Mother    Kidney disease Mother    COPD Mother    Lupus Mother    Heart disease Father    Breast cancer Maternal Grandmother    Diabetes Maternal Grandfather     Social History   Tobacco Use   Smoking status: Former    Packs/day: 2.00    Years: 16.00    Pack years: 32.00    Types: Cigarettes    Quit date: 04/08/2002    Years since quitting: 18.4   Smokeless tobacco: Never  Vaping Use   Vaping Use: Never  used  Substance Use Topics   Alcohol use: No    Alcohol/week: 0.0 standard drinks    Comment: Quit in 1987   Drug use: No    Comment: Use to take cocaine, marijuana, per patient everything. Stopped in 1987.    Home Medications Prior to Admission medications   Medication Sig Start Date End Date Taking? Authorizing Provider  acetaminophen (TYLENOL) 325 MG tablet Take 2 tablets (650 mg total) by mouth every 6 (six) hours as needed for mild pain. Patient not taking: Reported on 04/11/2020 03/25/20   Lucky Rathke, FNP  alum & mag hydroxide-simeth (MAALOX/MYLANTA) 200-200-20 MG/5ML suspension Take 30 mLs by mouth every 4 (four) hours as needed for indigestion. Patient not taking: Reported on 04/11/2020 03/25/20   Lucky Rathke, FNP  gabapentin (NEURONTIN)  300 MG capsule Take 1 capsule (300 mg total) by mouth 3 (three) times daily. Patient not taking: Reported on 04/11/2020 03/25/20   Lucky Rathke, FNP  haloperidol (HALDOL) 5 MG tablet Take 1 tablet (5 mg total) by mouth every 6 (six) hours as needed for agitation (severe anxiety). Patient not taking: Reported on 04/11/2020 03/25/20   Lucky Rathke, FNP  magnesium hydroxide (MILK OF MAGNESIA) 400 MG/5ML suspension Take 30 mLs by mouth daily as needed for mild constipation. Patient not taking: Reported on 04/11/2020 03/25/20   Lucky Rathke, FNP  mirtazapine (REMERON SOL-TAB) 30 MG disintegrating tablet Take 1 tablet (30 mg total) by mouth at bedtime. 07/12/20   Salley Slaughter, NP  OVER THE COUNTER MEDICATION Take 1 drop by mouth as needed (anxiety). 1 dropper full of CBD oil Patient not taking: Reported on 04/19/2020    [provider]  QUEtiapine (SEROQUEL) 50 MG tablet Take 1 tablet (50 mg total) by mouth at bedtime. Patient not taking: Reported on 04/11/2020 03/25/20   Lucky Rathke, FNP  sodium chloride (OCEAN) 0.65 % SOLN nasal spray Place 1 spray into both nostrils as needed for congestion. Patient not taking: Reported on 04/19/2020    [provider]  sucralfate (CARAFATE) 1 g tablet Take 1 tablet (1 g total) by mouth once for 1 dose. Patient not taking: Reported on 04/11/2020 03/25/20 03/25/20  Lucky Rathke, FNP    Allergies    Morphine and related and Diazepam  Review of Systems   Review of Systems  Constitutional:  Negative for appetite change and fatigue.  HENT:  Negative for congestion, ear discharge and sinus pressure.   Eyes:  Negative for discharge.  Respiratory:  Negative for cough.   Cardiovascular:  Negative for chest pain.  Gastrointestinal:  Negative for abdominal pain and diarrhea.  Genitourinary:  Negative for frequency and hematuria.  Musculoskeletal:  Negative for back pain.  Skin:  Negative for rash.  Neurological:  Positive for headaches. Negative for  seizures.  Psychiatric/Behavioral:  Negative for hallucinations.    Physical Exam Updated Vital Signs BP 134/81   Pulse 80   Temp 98.1 F (36.7 C) (Oral)   Resp 19   Ht 5\' 6"  (1.676 m)   Wt 63.5 kg   SpO2 96%   BMI 22.60 kg/m   Physical Exam Nursing note reviewed.  Constitutional:      Appearance: He is well-developed.  HENT:     Head: Normocephalic.  Eyes:     General: No scleral icterus.    Conjunctiva/sclera: Conjunctivae normal.  Neck:     Thyroid: No thyromegaly.  Cardiovascular:     Rate and Rhythm: Normal  rate and regular rhythm.     Heart sounds: No murmur heard.   No friction rub. No gallop.  Pulmonary:     Breath sounds: No stridor. No wheezing or rales.  Chest:     Chest wall: No tenderness.  Abdominal:     General: There is no distension.     Tenderness: There is no abdominal tenderness. There is no rebound.  Musculoskeletal:        General: Normal range of motion.     Cervical back: Neck supple.  Lymphadenopathy:     Cervical: No cervical adenopathy.  Skin:    Findings: No erythema or rash.  Neurological:     Mental Status: He is alert and oriented to person, place, and time.     Motor: No abnormal muscle tone.     Coordination: Coordination normal.  Psychiatric:        Behavior: Behavior normal.    ED Results / Procedures / Treatments   Labs (all labs ordered are listed, but only abnormal results are displayed) Labs Reviewed  BASIC METABOLIC PANEL - Abnormal; Notable for the following components:      Result Value   Glucose, Bld 142 (*)    BUN 24 (*)    All other components within normal limits  CBC  TROPONIN I (HIGH SENSITIVITY)  TROPONIN I (HIGH SENSITIVITY)    EKG EKG Interpretation  Date/Time:  Sunday October 01 2020 18:07:55 EDT Ventricular Rate:  73 PR Interval:  143 QRS Duration: 93 QT Interval:  374 QTC Calculation: 413 R Axis:   -43 Text Interpretation: Sinus rhythm Left axis deviation Abnormal R-wave progression, late  transition Confirmed by Milton Ferguson [phone]) on 10/01/2020 6:54:54 PM  Radiology DG Chest 2 View  Result Date: 10/01/2020 CLINICAL DATA:  Chest pain. EXAM: CHEST - 2 VIEW COMPARISON:  05/22/2019 FINDINGS: The heart size and mediastinal contours are within normal limits. Both lungs are clear. The visualized skeletal structures are unremarkable. IMPRESSION: No active cardiopulmonary disease. Electronically Signed   By: Dorise Bullion III M.D   On: 10/01/2020 18:04   CT Head Wo Contrast  Result Date: 10/01/2020 CLINICAL DATA:  Headache and chest pain. EXAM: CT HEAD WITHOUT CONTRAST TECHNIQUE: Contiguous axial images were obtained from the base of the skull through the vertex without intravenous contrast. COMPARISON:  July 15, 2014 FINDINGS: Brain: There is mild cerebral atrophy with widening of the extra-axial spaces and ventricular dilatation. There are areas of decreased attenuation within the white matter tracts of the supratentorial brain, consistent with microvascular disease changes. Vascular: No hyperdense vessel or unexpected calcification. Skull: Normal. Negative for fracture or focal lesion. Sinuses/Orbits: No acute finding. Other: None. IMPRESSION: 1. Generalized cerebral atrophy. 2. No acute intracranial abnormality. Electronically Signed   By: Virgina Norfolk M.D.   On: 10/01/2020 19:20    Procedures Procedures   Medications Ordered in ED Medications - No data to display  ED Course  I have reviewed the triage vital signs and the nursing notes.  Pertinent labs & imaging results that were available during my care of the patient were reviewed by me and considered in my medical decision making (see chart for details).    MDM Rules/Calculators/A&P                          Labs and x-rays unremarkable.  Patient with severe anxiety producing all symptoms.  He was told to follow-up with a family doctor but most likely  he will.  Patient states that he stays in the house under percent  of the time for the last 3 years Final Clinical Impression(s) / ED Diagnoses Final diagnoses:  Headache disorder  Anxiety    Rx / DC Orders ED Discharge Orders     None        Milton Ferguson, MD 10/02/20 [phone]

## 2020-10-01 NOTE — Discharge Instructions (Addendum)
Follow up with a family md 

## 2020-10-01 NOTE — ED Notes (Signed)
Patient returned from CT

## 2020-10-10 ENCOUNTER — Telehealth (INDEPENDENT_AMBULATORY_CARE_PROVIDER_SITE_OTHER): Payer: Medicare (Managed Care) | Admitting: Psychiatry

## 2020-10-10 ENCOUNTER — Other Ambulatory Visit: Payer: Self-pay

## 2020-10-10 ENCOUNTER — Encounter (HOSPITAL_COMMUNITY): Payer: Self-pay | Admitting: Psychiatry

## 2020-10-10 DIAGNOSIS — F411 Generalized anxiety disorder: Secondary | ICD-10-CM

## 2020-10-10 MED ORDER — MIRTAZAPINE 30 MG PO TABS: 30.0000 mg | ORAL_TABLET | Freq: Every day | ORAL | 2 refills | Status: DC

## 2020-10-10 NOTE — Progress Notes (Signed)
[facility] MD/PA/NP OP Progress Note Virtual Visit via Telephone Note  I connected with [name] on 10/10/20 at  1:00 PM EDT by telephone and verified that I am speaking with the correct person using two identifiers.  Location: Patient: home Provider: Clinic   I discussed the limitations, risks, security and privacy concerns of performing an evaluation and management service by telephone and the availability of in person appointments. I also discussed with the patient that there may be a patient responsible charge related to this service. The patient expressed understanding and agreed to proceed.   I provided 30 minutes of non-face-to-face time during this encounter.   10/10/2020 1:48 PM [name]  MRN:  [id number]  Chief Complaint: "Before I got of these medications I didn't have these symptoms"  HPI: 65 year old male seen today for follow up psychiatric evaluation.   He has a psychiatric history of Bipolar disorder, depression, anxiety, and panic disorder.  He is currently taking Mirtazapine 30 mg daily.  Today patient was unable to login virtually so his assessment was done over the phone. During exam he excessively coughed. He notes that he seen by a doctor in the ED who notes that his cough is due to stress. He notes that he believes its due to benzo withdrawal however notes that he has not  been on benzodiazepines in over a year. He notes that he researched on benzobuddy.com and https://tucker.info/.  He reports that his symptoms are consistent with side effects noted on these 2 website.  Patient notes that he is in pain most days. He notes that his back and body generally aches. Patient notes that his pain and cough exacerbates his anxiety and depression.  He notes that valium was effective in the past in managing his anxiety and depression.  He also notes he is interested in starting a IV benzodiazepine to help manage his symptoms.  Provider informed patient that at this time it is not  recommended.  He notes that he has been told that by several other physicians and notes that other providers is laughed at him when he requested that.  Patient notes at this time his anxiety and depression are far off the charts.  He however informed Probation officer that he does not want to do anxiety and depression screenings today.  He denies SI/HI/VAH.  Patient talkative throughout the exam.  He notes that he is irritable at times and has fluctuations in his mood.  He notes that he becomes frustrated with medical providers because they do not understand his symptoms and reports that he is distrustful of them because they don't communicate together.   He denies SI/HI/VAH or paranoia.  Provider discussed starting a mood stabilizer/antidepressant.  At this time he notes that he does not want to start any other medication.  He notes that mirtazapine helps him sleep noting that he sleeps 4 to 5 hours nightly.  No medication changes made today.  Patient agreeable to continue mirtazapine as prescribed.  He notes that he does not want counseling.  No other concerns noted at this time. Visit Diagnosis:    ICD-10-CM   1. Generalized anxiety disorder  F41.1 mirtazapine (REMERON) 30 MG tablet      Past Psychiatric History:  Bipolar disorder, depression, anxiety, and panic disorder  Past Medical History:  Past Medical History:  Diagnosis Date   Alcohol abuse    Allergy    Bipolar disorder ([place])    Chronic pain    lower back pain, "that has  progressed"   Depression    Diverticulosis    Dyspnea    Hepatitis C    hx. of"states he no longer has"" is clear".   History of recreational drug use    "cocaine, marijuana, polysubstance" quit 1987.   HLD (hyperlipidemia)    Hypertension    Pancreatitis 2015   Panic attack    Sleep apnea    no cpap used,still has machine(use rarely)    Past Surgical History:  Procedure Laterality Date   CARDIAC CATHETERIZATION  2003   No CAD   EUS N/A 06/23/2014   Procedure:  UPPER ENDOSCOPIC ULTRASOUND (EUS) LINEAR;  Surgeon: Milus Banister, MD;  Location: [facility];  Service: Endoscopy;  Laterality: N/A;   sinus surgery     TONSILLECTOMY      Family Psychiatric History: Unknown  Family History:  Family History  Problem Relation Age of Onset   Heart disease Mother    Kidney disease Mother    COPD Mother    Lupus Mother    Heart disease Father    Breast cancer Maternal Grandmother    Diabetes Maternal Grandfather     Social History:  Social History   Socioeconomic History   Marital status: Divorced    Spouse name: Not on file   Number of children: 0   Years of education: GED   Highest education level: Not on file  Occupational History   Occupation: DISABLED   Occupation:    Tobacco Use   Smoking status: Former    Packs/day: 2.00    Years: 16.00    Pack years: 32.00    Types: Cigarettes    Quit date: 04/08/2002    Years since quitting: 18.5   Smokeless tobacco: Never  Vaping Use   Vaping Use: Never used  Substance and Sexual Activity   Alcohol use: No    Alcohol/week: 0.0 standard drinks    Comment: Quit in 1987   Drug use: No    Comment: Use to take cocaine, marijuana, per patient everything. Stopped in 1987.   Sexual activity: Not Currently    Partners: Female  Other Topics Concern   Not on file  Social History Narrative   Patient lives at home alone.    Disabled.   Education GED    Both handed.   Caffeine two cups of coffee daily.      Social Determinants of Health   Financial Resource Strain: Not on file  Food Insecurity: Not on file  Transportation Needs: Not on file  Physical Activity: Not on file  Stress: Not on file  Social Connections: Not on file    Allergies:  Allergies  Allergen Reactions   Morphine And Related Shortness Of Breath   Diazepam Other (See Comments)    Ringing in ears, diarrhea, CNS issues-multiple reactions     Metabolic Disorder Labs: Lab Results  Component Value Date   HGBA1C 5.5  03/24/2020   MPG 111.15 03/24/2020   MPG 111 09/15/2012   No results found for: PROLACTIN Lab Results  Component Value Date   CHOL 170 03/24/2020   TRIG 44 03/24/2020   HDL 62 03/24/2020   CHOLHDL 2.7 03/24/2020   VLDL 9 03/24/2020   LDLCALC 99 03/24/2020   LDLCALC 74 08/16/2019   Lab Results  Component Value Date   TSH 2.266 03/24/2020   TSH 3.362 12/14/2019    Therapeutic Level Labs: No results found for: LITHIUM No results found for: VALPROATE No components found for:  CBMZ  Current Medications: Current Outpatient Medications  Medication Sig Dispense Refill   mirtazapine (REMERON) 30 MG tablet Take 1 tablet (30 mg total) by mouth at bedtime. 30 tablet 2   acetaminophen (TYLENOL) 325 MG tablet Take 2 tablets (650 mg total) by mouth every 6 (six) hours as needed for mild pain. (Patient not taking: Reported on 04/11/2020) 10 tablet    alum & mag hydroxide-simeth (MAALOX/MYLANTA) 200-200-20 MG/5ML suspension Take 30 mLs by mouth every 4 (four) hours as needed for indigestion. (Patient not taking: Reported on 04/11/2020) 355 mL 0   gabapentin (NEURONTIN) 300 MG capsule Take 1 capsule (300 mg total) by mouth 3 (three) times daily. (Patient not taking: Reported on 04/11/2020) 90 capsule 0   haloperidol (HALDOL) 5 MG tablet Take 1 tablet (5 mg total) by mouth every 6 (six) hours as needed for agitation (severe anxiety). (Patient not taking: Reported on 04/11/2020) 30 tablet 0   magnesium hydroxide (MILK OF MAGNESIA) 400 MG/5ML suspension Take 30 mLs by mouth daily as needed for mild constipation. (Patient not taking: Reported on 04/11/2020) 355 mL 0   OVER THE COUNTER MEDICATION Take 1 drop by mouth as needed (anxiety). 1 dropper full of CBD oil (Patient not taking: Reported on 04/19/2020)     QUEtiapine (SEROQUEL) 50 MG tablet Take 1 tablet (50 mg total) by mouth at bedtime. (Patient not taking: Reported on 04/11/2020) 30 tablet 0   sodium chloride (OCEAN) 0.65 % SOLN nasal spray Place 1 spray  into both nostrils as needed for congestion. (Patient not taking: Reported on 04/19/2020)     sucralfate (CARAFATE) 1 g tablet Take 1 tablet (1 g total) by mouth once for 1 dose. (Patient not taking: Reported on 04/11/2020) 1 tablet 0   No current facility-administered medications for this visit.     Musculoskeletal: Strength & Muscle Tone:  Unable to assess due to telephone visit Gait & Station:  Unable to assess due to telephone visit Patient leans: N/A  Psychiatric Specialty Exam: Review of Systems  There were no vitals taken for this visit.There is no height or weight on file to calculate BMI.  General Appearance:  Unable to assess due to telephone visit  Eye Contact:   Unable to assess due to telephone visit  Speech:  Clear and Coherent and Pressured  Volume:  Increased  Mood:  Anxious, Depressed and Irritable  Affect:   Unable to assess due to telephone visit  Thought Process:  Coherent, Goal Directed and Linear  Orientation:  Full (Time, Place, and Person)  Thought Content: WDL and Logical   Suicidal Thoughts:  No  Homicidal Thoughts:  No  Memory:  Immediate;   Good Recent;   Good Remote;   Good  Judgement:  Fair  Insight:  Fair  Psychomotor Activity:  Restlessness and Patient notes that he is restless.   Concentration:  Concentration: Fair and Attention Span: Fair  Recall:  [employer] of Knowledge: Good  Language: Good  Akathisia:   Unable to assess due to telephone visit  Handed:  Right  AIMS (if indicated): Not done  Assets:  Communication Skills Desire for Improvement Housing Leisure Time Social Support  ADL's:  Intact  Cognition: WNL  Sleep:  Fair   Screenings: GAD-7    Flowsheet Row Video Visit from 07/12/2020 in [facility] from 04/19/2020 in Primary Care at [facility]  Total GAD-7 Score 19 [place] Office Visit  from 02/28/2014 in [facility]  Total Score  (max 30 points ) 29      PHQ2-9    Flowsheet Row Counselor from 07/20/2020 in [facility] Video Visit from 07/12/2020 in [facility] from 04/19/2020 in Primary Care at [facility] ED from 03/24/2020 in [facility] Office Visit from 09/15/2019 in [facility]  PHQ-2 Total Score 6 4 3 6 6   PHQ-9 Total Score 24 16 12 24 23       Flowsheet Row ED from 10/01/2020 in [facility] DEPT Counselor from 07/20/2020 in [facility] ED from 03/24/2020 in [facility] No Risk Error: Q3, 4, or 5 should not be populated when Q2 is No Error: Q2 is Yes, you must answer 3, 4, and 5        Assessment and Plan: Patient endorses symptoms of anxiety and depression. Today provider recommended patient starting a mood stabilizer or an antidepressant but patient was not agreeable. He notes Mirtazapine has been effective and notes that he would like to continue it as prescribed.  1. Generalized anxiety disorder  Continue- mirtazapine (REMERON) 30 MG tablet; Take 1 tablet (30 mg total) by mouth at bedtime.  Dispense: 30 tablet; Refill: 2   Follow up in 3 months Follow up with therapy   Salley Slaughter, NP 10/10/2020, 1:48 PM

## 2020-10-23 ENCOUNTER — Other Ambulatory Visit (HOSPITAL_COMMUNITY): Payer: Self-pay | Admitting: Psychiatry

## 2020-10-23 DIAGNOSIS — F411 Generalized anxiety disorder: Secondary | ICD-10-CM

## 2020-11-15 ENCOUNTER — Telehealth: Payer: Self-pay

## 2020-11-15 NOTE — Telephone Encounter (Signed)
LVM to have pt call back to schedule AWV.   RE: confirm insurance and schedule AWV on my schedule if times are convenient for patient or other AWV schedule as template permits.   

## 2020-12-18 ENCOUNTER — Other Ambulatory Visit (HOSPITAL_COMMUNITY): Payer: Self-pay | Admitting: Psychiatry

## 2020-12-18 DIAGNOSIS — F411 Generalized anxiety disorder: Secondary | ICD-10-CM

## 2020-12-20 ENCOUNTER — Telehealth (HOSPITAL_COMMUNITY): Payer: Self-pay | Admitting: *Deleted

## 2020-12-20 NOTE — Telephone Encounter (Signed)
Provider called patient and he notes that he would like writer to tell the hospital providers that he is suffering from withdrawal from benzos.  Patient has not been on benzodiazepines in over 2 years.  Provider recommended that patient be on a mood stabilizers or another antidepressant to help his anxiety.  He notes that he does not feel like he is having issues with his mood and that his issue is not increased anxiety.  He notes that he continues to get his information off of benzo but he stopped calm and reports that the symptoms that he experiences are similar to the symptoms experienced by other participants on the website.  Patient became frustrated with provider assessment and notes that she has not been helpful.  He then hung up the phone.

## 2020-12-20 NOTE — Telephone Encounter (Signed)
Patient called stating he is having difficulty breathing and eating because he is coughing all the time, feet curl up in cramps, nerve pain, and he believes this is from not being on Valium. He took it for over 40 years and has weaned self off nearly two years ago. He has been to the hospital four times and they discount him and say its his nerves, he wants to speak with you, though I am unclear how you can help him. I believe he called in July with the same presentation. Will forward his request to Dr Ronne Binning.

## 2021-01-09 ENCOUNTER — Other Ambulatory Visit: Payer: Self-pay

## 2021-01-09 ENCOUNTER — Telehealth (HOSPITAL_COMMUNITY): Payer: Medicare (Managed Care) | Admitting: Psychiatry

## 2021-01-27 ENCOUNTER — Other Ambulatory Visit (HOSPITAL_COMMUNITY): Payer: Self-pay | Admitting: Psychiatry

## 2021-01-27 DIAGNOSIS — F411 Generalized anxiety disorder: Secondary | ICD-10-CM

## 2021-03-09 ENCOUNTER — Emergency Department (HOSPITAL_COMMUNITY)
Admission: EM | Admit: 2021-03-09 | Discharge: 2021-03-09 | Discharge status: Against Medical Advice | Payer: Medicare (Managed Care) | Source: Home / Self Care

## 2021-03-13 ENCOUNTER — Telehealth (HOSPITAL_COMMUNITY): Payer: Self-pay

## 2021-03-13 NOTE — Telephone Encounter (Signed)
Patient called and stated that he wants to start on a low dose of Elavil. He's not currently taking any medication, including Mirtazapine that has been currently prescribed to him. Writer couldn't understand half of what patient said. Please review and advise. Thank you

## 2021-03-15 ENCOUNTER — Other Ambulatory Visit (HOSPITAL_COMMUNITY): Payer: Self-pay | Admitting: Psychiatry

## 2021-03-15 ENCOUNTER — Telehealth (HOSPITAL_COMMUNITY): Payer: Self-pay | Admitting: *Deleted

## 2021-03-15 DIAGNOSIS — F411 Generalized anxiety disorder: Secondary | ICD-10-CM

## 2021-03-15 MED ORDER — GABAPENTIN 300 MG PO CAPS: 300.0000 mg | ORAL_CAPSULE | Freq: Three times a day (TID) | ORAL | 2 refills | Status: DC

## 2021-03-15 MED ORDER — AMITRIPTYLINE HCL 10 MG PO TABS: 10.0000 mg | ORAL_TABLET | Freq: Every day | ORAL | 3 refills | Status: DC

## 2021-03-15 NOTE — Telephone Encounter (Signed)
Called just now stating he forgot to ask Dr Ronne Binning when he spoke with her earlier today if he can take Gabapentin for his pain. He had some put back that was 63 years old and it helped some so wanting to know if he can get a rx for it if the Dr thinks it will help.

## 2021-03-15 NOTE — Telephone Encounter (Signed)
Gabapentin [facility] to help manage anxiety and pain. Patient will need to schedule an appointment for future refills.

## 2021-03-15 NOTE — Telephone Encounter (Signed)
Thank you for the update!

## 2021-03-15 NOTE — Telephone Encounter (Signed)
Patient informed writer that he was only taking two pellets of Cymbalta. He notes that he has been more anxious and would like to try Amitriptyline at a lower dose. Provider was agreeable to this and informed patient to call clinic to schedule an appointment. He endorsed understanding and agreed.  Patient coughing during exam which he relates to benzo withdrawal. At this time Amitriptyline 10 mg started. No other concerns noted at this time.

## 2021-03-15 NOTE — Telephone Encounter (Signed)
Called him back to tell him Dr Ronne Binning called him in a rx for his Gabapentin for pain. He said he would like to start out slower on it than  the three times a day he was ordered to see how it is going to work for him, and he has had issues with other meds in the past.

## 2021-03-16 ENCOUNTER — Telehealth (HOSPITAL_COMMUNITY): Payer: Self-pay | Admitting: *Deleted

## 2021-03-16 NOTE — Telephone Encounter (Signed)
VM on writers phone stating he would like to make an appt in one month to speak with Dr Ronne Binning as she had directed him to do. He prefers virtual. Will ask front desk staff to call him and make him an appt.

## 2021-03-22 ENCOUNTER — Emergency Department (HOSPITAL_COMMUNITY)
Admission: EM | Admit: 2021-03-22 | Discharge: 2021-03-22 | Disposition: A | Discharge status: Home / Self Care | Payer: Medicare (Managed Care) | Attending: Emergency Medicine | Admitting: Emergency Medicine

## 2021-03-22 ENCOUNTER — Encounter (HOSPITAL_COMMUNITY): Payer: Self-pay | Admitting: *Deleted

## 2021-03-22 ENCOUNTER — Other Ambulatory Visit: Payer: Self-pay

## 2021-03-22 DIAGNOSIS — M79605 Pain in left leg: Secondary | ICD-10-CM | POA: Diagnosis not present

## 2021-03-22 DIAGNOSIS — I10 Essential (primary) hypertension: Secondary | ICD-10-CM | POA: Insufficient documentation

## 2021-03-22 DIAGNOSIS — Z87891 Personal history of nicotine dependence: Secondary | ICD-10-CM | POA: Insufficient documentation

## 2021-03-22 LAB — CBC WITH DIFFERENTIAL/PLATELET
Abs Immature Granulocytes: 0.04 10*3/uL (ref 0.00–0.07)
Basophils Absolute: 0.1 10*3/uL (ref 0.0–0.1)
Basophils Relative: 1 %
Eosinophils Absolute: 0.1 10*3/uL (ref 0.0–0.5)
Eosinophils Relative: 1 %
HCT: 46.8 % (ref 39.0–52.0)
Hemoglobin: 15.6 g/dL (ref 13.0–17.0)
Immature Granulocytes: 0 %
Lymphocytes Relative: 34 %
Lymphs Abs: 3.4 10*3/uL (ref 0.7–4.0)
MCH: 27 pg (ref 26.0–34.0)
MCHC: 33.3 g/dL (ref 30.0–36.0)
MCV: 81 fL (ref 80.0–100.0)
Monocytes Absolute: 0.9 10*3/uL (ref 0.1–1.0)
Monocytes Relative: 9 %
Neutro Abs: 5.4 10*3/uL (ref 1.7–7.7)
Neutrophils Relative %: 55 %
Platelets: 320 10*3/uL (ref 150–400)
RBC: 5.78 MIL/uL (ref 4.22–5.81)
RDW: 13.7 % (ref 11.5–15.5)
WBC: 9.9 10*3/uL (ref 4.0–10.5)
nRBC: 0 % (ref 0.0–0.2)

## 2021-03-22 LAB — COMPREHENSIVE METABOLIC PANEL
ALT: 18 U/L (ref 0–44)
AST: 22 U/L (ref 15–41)
Albumin: 4.2 g/dL (ref 3.5–5.0)
Alkaline Phosphatase: 75 U/L (ref 38–126)
Anion gap: 8 (ref 5–15)
BUN: 19 mg/dL (ref 8–23)
CO2: 24 mmol/L (ref 22–32)
Calcium: 9.2 mg/dL (ref 8.9–10.3)
Chloride: 105 mmol/L (ref 98–111)
Creatinine, Ser: 1.11 mg/dL (ref 0.61–1.24)
GFR, Estimated: >60 mL/min (ref >60)
Glucose, Bld: 132 mg/dL — ABNORMAL HIGH (ref 70–99)
Potassium: 3.7 mmol/L (ref 3.5–5.1)
Sodium: 137 mmol/L (ref 135–145)
Total Bilirubin: 0.8 mg/dL (ref 0.3–1.2)
Total Protein: 7.5 g/dL (ref 6.5–8.1)

## 2021-03-22 MED ORDER — OXYCODONE-ACETAMINOPHEN 5-325 MG PO TABS
1.0000 | ORAL_TABLET | Freq: Once | ORAL | Status: AC
  Administered 2021-03-22: 1 via ORAL
  Filled 2021-03-22: qty 1

## 2021-03-22 NOTE — ED Provider Notes (Signed)
MSE was initiated and I personally evaluated the patient and placed orders (if any) at  4:55 AM on March 22, 2021.  Patient to ED with complaint of foot pain c/w neuropathy that has been difficult to treat due to medication intolerance. He reports he started gabapentin several days ago but only took it for 2 days. Difficult historian.   Today's Vitals   03/22/21 0414 03/22/21 0427  BP: (!) 150/84   Pulse: (!) 105   Resp: (!) 22   Temp: 97.9 F (36.6 C)   TempSrc: Oral   SpO2: 96%   Weight:  63.5 kg  Height:  5\' 7"  (1.702 m)  PainSc:  10-Worst pain ever   Body mass index is 21.93 kg/m.  Patient appears SOB vs splinted breathing due to pain Lungs clear No rash No facial swelling  The patient appears stable so that the remainder of the MSE may be completed by another provider.   Charlann Lange, PA-C 03/22/21 6967    Quintella Reichert, MD 03/22/21 [phone]

## 2021-03-22 NOTE — Discharge Instructions (Signed)
Please follow-up with your specialists about your long-term symptoms.

## 2021-03-22 NOTE — ED Provider Notes (Signed)
[facility] EMERGENCY DEPARTMENT Provider Note   CSN: [id number] Arrival date & time: 03/22/21  0402     History Chief Complaint  Patient presents with   Leg Pain    [name] is a 65 y.o. male with medical history of substance use disorder, bipolar disorder and panic attacks presenting today with complaint of left leg pain.  Reports that this has been going on for the past year and sometimes alternates between the 2 legs.  Was on value for over 40 years and feels like this caused him to have the pain after being taken off of it.  Was given gabapentin 3 days ago however reports "it is making me feel worse."  Reports he is scared of medications and does not like to take them.  Has oxycodone from a pain clinic however has not used it very much.  Also does not want to take the gabapentin because he is seen bad things about it on the Internet.  Describes this discomfort as "feels like it is burning inside."   Leg Pain     Past Medical History:  Diagnosis Date   Alcohol abuse    Allergy    Bipolar disorder (HCC)    Chronic pain    lower back pain, "that has progressed"   Depression    Diverticulosis    Dyspnea    Hepatitis C    hx. of"states he no longer has"" is clear".   History of recreational drug use    "cocaine, marijuana, polysubstance" quit 1987.   HLD (hyperlipidemia)    Hypertension    Pancreatitis 2015   Panic attack    Sleep apnea    no cpap used,still has machine(use rarely)    Patient Active Problem List   Diagnosis Date Noted   No-show for appointment 05/10/2020   Generalized anxiety disorder 10/13/2019   Trouble swallowing 08/19/2019   Depression 08/18/2019   Groin pain, left 05/20/2018   Abdominal pain, chronic, epigastric 11/30/2014   Chronic low back pain 10/17/2014   Disc degeneration, lumbar 10/17/2014   HLD (hyperlipidemia) 08/25/2014   Chest pain on exertion 09/07/2012   Memory problem 12/25/2011   GERD (gastroesophageal  reflux disease) 12/18/2010   OSA on CPAP 09/20/2009   HYPERTENSION, BENIGN - Diet Controlled 10/19/2008   HEPATITIS C 06/05/2006   DEPRESSION, MAJOR, RECURRENT 06/05/2006   BIPOLAR DISORDER 06/05/2006   Anxiety 06/05/2006   Hx of Alcohol abuse 06/05/2006    Past Surgical History:  Procedure Laterality Date   CARDIAC CATHETERIZATION  2003   No CAD   EUS N/A 06/23/2014   Procedure: UPPER ENDOSCOPIC ULTRASOUND (EUS) LINEAR;  Surgeon: Milus Banister, MD;  Location: [facility];  Service: Endoscopy;  Laterality: N/A;   sinus surgery     TONSILLECTOMY         Family History  Problem Relation Age of Onset   Heart disease Mother    Kidney disease Mother    COPD Mother    Lupus Mother    Heart disease Father    Breast cancer Maternal Grandmother    Diabetes Maternal Grandfather     Social History   Tobacco Use   Smoking status: Former    Packs/day: 2.00    Years: 16.00    Pack years: 32.00    Types: Cigarettes    Quit date: 04/08/2002    Years since quitting: 18.9   Smokeless tobacco: Never  Vaping Use   Vaping Use: Never used  Substance  Use Topics   Alcohol use: No    Alcohol/week: 0.0 standard drinks    Comment: Quit in 1987   Drug use: No    Comment: Use to take cocaine, marijuana, per patient everything. Stopped in 1987.    Home Medications Prior to Admission medications   Medication Sig Start Date End Date Taking? Authorizing Provider  acetaminophen (TYLENOL) 325 MG tablet Take 2 tablets (650 mg total) by mouth every 6 (six) hours as needed for mild pain. Patient not taking: Reported on 04/11/2020 03/25/20   Lucky Rathke, FNP  alum & mag hydroxide-simeth (MAALOX/MYLANTA) 200-200-20 MG/5ML suspension Take 30 mLs by mouth every 4 (four) hours as needed for indigestion. Patient not taking: Reported on 04/11/2020 03/25/20   Lucky Rathke, FNP  amitriptyline (ELAVIL) 10 MG tablet Take 1 tablet (10 mg total) by mouth at bedtime. 03/15/21   Salley Slaughter, NP   gabapentin (NEURONTIN) 300 MG capsule Take 1 capsule (300 mg total) by mouth 3 (three) times daily. 03/15/21   Salley Slaughter, NP  haloperidol (HALDOL) 5 MG tablet Take 1 tablet (5 mg total) by mouth every 6 (six) hours as needed for agitation (severe anxiety). Patient not taking: Reported on 04/11/2020 03/25/20   Lucky Rathke, FNP  magnesium hydroxide (MILK OF MAGNESIA) 400 MG/5ML suspension Take 30 mLs by mouth daily as needed for mild constipation. Patient not taking: Reported on 04/11/2020 03/25/20   Lucky Rathke, FNP  OVER THE COUNTER MEDICATION Take 1 drop by mouth as needed (anxiety). 1 dropper full of CBD oil Patient not taking: Reported on 04/19/2020    [provider]  QUEtiapine (SEROQUEL) 50 MG tablet Take 1 tablet (50 mg total) by mouth at bedtime. Patient not taking: Reported on 04/11/2020 03/25/20   Lucky Rathke, FNP  sodium chloride (OCEAN) 0.65 % SOLN nasal spray Place 1 spray into both nostrils as needed for congestion. Patient not taking: Reported on 04/19/2020    [provider]  sucralfate (CARAFATE) 1 g tablet Take 1 tablet (1 g total) by mouth once for 1 dose. Patient not taking: Reported on 04/11/2020 03/25/20 03/25/20  Lucky Rathke, FNP    Allergies    Morphine and related and Diazepam  Review of Systems   Review of Systems  Musculoskeletal:  Positive for arthralgias and myalgias. Negative for gait problem.  Neurological:  Positive for dizziness, weakness and headaches.  All other systems reviewed and are negative.  Physical Exam Updated Vital Signs BP (!) 146/86    Pulse 90    Temp 98.1 F (36.7 C)    Resp 18    Ht 5\' 7"  (1.702 m)    Wt 63.5 kg    SpO2 97%    BMI 21.93 kg/m   Physical Exam Vitals and nursing note reviewed.  Constitutional:      Appearance: Normal appearance.  HENT:     Head: Normocephalic and atraumatic.  Eyes:     General: No scleral icterus.    Conjunctiva/sclera: Conjunctivae normal.  Pulmonary:     Effort:  Pulmonary effort is normal. No respiratory distress.  Musculoskeletal:        General: Tenderness present. No swelling, deformity or signs of injury.     Right lower leg: No edema.     Left lower leg: No edema.  Skin:    General: Skin is warm and dry.     Capillary Refill: Capillary refill takes less than 2 seconds.  Findings: No rash.     Comments: Some varicose veins, strong DP pulses bilaterally  Neurological:     Mental Status: He is alert.  Psychiatric:        Mood and Affect: Mood normal.    ED Results / Procedures / Treatments   Labs (all labs ordered are listed, but only abnormal results are displayed) Labs Reviewed  COMPREHENSIVE METABOLIC PANEL - Abnormal; Notable for the following components:      Result Value   Glucose, Bld 132 (*)    All other components within normal limits  CBC WITH DIFFERENTIAL/PLATELET    EKG None  Radiology No results found.  Procedures Procedures   Medications Ordered in ED Medications  oxyCODONE-acetaminophen (PERCOCET/ROXICET) 5-325 MG per tablet 1 tablet (1 tablet Oral Given 03/22/21 0425)    ED Course  I have reviewed the triage vital signs and the nursing notes.  Pertinent labs & imaging results that were available during my care of the patient were reviewed by me and considered in my medical decision making (see chart for details).    MDM Rules/Calculators/A&P Fully evaluated by me, writhing around in room in pain.  Reported that this has been going on for a year however some people do not believe that his pain is real.  Given Percocet in the waiting room.  Reported it did not work.  Patient's presentation appears to be chronic.  He will order that he was in too much pain to wait for the pain clinic.  Noted to be walking around the room throughout evaluation.  Low suspicion for DVT, Wells likelihood low.  We discussed the respective utility of gabapentin, muscle relaxants, steroids and opioid pain medications, all of  which she does not want to take.  When offered by specialist he reported that he already has 1.  Also with tangential speech, speaking about Science writer.  I suspect his pain is psychosomatic.  Encouraged to follow-up with the specialist that he already has or utilize the pain medications that he has been prescribed as there is nothing else for [place] to do in the emergency department.  Understanding and agreeable.  Discharge  Final Clinical Impression(s) / ED Diagnoses Final diagnoses:  Left leg pain    Rx / DC Orders Results and diagnoses were explained to the patient. Return precautions discussed in full. Patient had no additional questions and expressed complete understanding.     Rhae Hammock, PA-C 03/22/21 0315    Fredia Sorrow, MD 03/28/21 1310

## 2021-03-22 NOTE — ED Triage Notes (Signed)
Patient presents to ed via [facility] c/o feet pain  states he started taking Neurontin 3 days ago for feet pain states it helped the first night and then the pain got worse , states he has had this pain for 3 years.

## 2021-05-22 ENCOUNTER — Other Ambulatory Visit: Payer: Self-pay

## 2021-05-22 ENCOUNTER — Telehealth (INDEPENDENT_AMBULATORY_CARE_PROVIDER_SITE_OTHER): Payer: Medicare (Managed Care) | Admitting: Psychiatry

## 2021-05-22 ENCOUNTER — Encounter (HOSPITAL_COMMUNITY): Payer: Self-pay | Admitting: Psychiatry

## 2021-05-22 DIAGNOSIS — F411 Generalized anxiety disorder: Secondary | ICD-10-CM

## 2021-05-22 NOTE — Progress Notes (Signed)
[facility] MD/PA/NP OP Progress Note Virtual Visit via Telephone Note  I connected with [name] on 05/22/21 at 11:00 AM EST by telephone and verified that I am speaking with the correct person using two identifiers.  Location: Patient: home Provider: Clinic   I discussed the limitations, risks, security and privacy concerns of performing an evaluation and management service by telephone and the availability of in person appointments. I also discussed with the patient that there may be a patient responsible charge related to this service. The patient expressed understanding and agreed to proceed.   I provided 30 minutes of non-face-to-face time during this encounter.   05/22/2021 9:21 AM [name]  MRN:  [id number]  Chief Complaint: "I took gabapentin for 3 days and then stopped"  HPI: 66 year old male seen today for follow up psychiatric evaluation.   He has a psychiatric history of Bipolar disorder, depression, anxiety, and panic disorder.  He is currently taking  gabapentin 300 mg three times daily and Mirtazapine 30 mg daily. Patient notes that he discontinued gabapentin and mirtazapine.   Today patient was unable to login virtually so his assessment was done over the phone. During exam he excessively coughed.  He denied tobacco use but notes that he has been smoking marijuana to help manage anxiety and sleep.  Provider informed patient that marijuana can exacerbate his mental health.  He endorsed understanding.  He notes that his gabapentin exacerbated his coughing.  He notes that he also discontinue mirtazapine.  Patient informed Probation officer that he is distrustful of doctors as nothing is helped over the last 10 years.  Patient reports that he constantly is in pain.  He notes that his pain is torturing.  Provider asked patient if he wanted a referral to pain management, primary care, or neurology and he notes that he does not.  He reports that the only doctor he trust is Dr. Nicholaus Bloom in pain  management.    Patient also informed Probation officer that he continues to believe that his symptoms started after he discontinued benzodiazepines over a year ago.  A GAD-7 and a PHQ-9 cannot be conducted today as patient was excessively coughing.  He informed Probation officer that he will follow-up as needed but at this time he does not take any medication for his mental health.  Today he denies SI/HI/VAH, mania, or paranoia  No medications restarted today.  Patient will follow-up as needed.  No other concerns noted at this time. Visit Diagnosis:  No diagnosis found.   Past Psychiatric History:  Bipolar disorder, depression, anxiety, and panic disorder  Past Medical History:  Past Medical History:  Diagnosis Date   Alcohol abuse    Allergy    Bipolar disorder ([place])    Chronic pain    lower back pain, "that has progressed"   Depression    Diverticulosis    Dyspnea    Hepatitis C    hx. of"states he no longer has"" is clear".   History of recreational drug use    "cocaine, marijuana, polysubstance" quit 1987.   HLD (hyperlipidemia)    Hypertension    Pancreatitis 2015   Panic attack    Sleep apnea    no cpap used,still has machine(use rarely)    Past Surgical History:  Procedure Laterality Date   CARDIAC CATHETERIZATION  2003   No CAD   EUS N/A 06/23/2014   Procedure: UPPER ENDOSCOPIC ULTRASOUND (EUS) LINEAR;  Surgeon: Milus Banister, MD;  Location: [facility];  Service: Endoscopy;  Laterality:  N/A;   sinus surgery     TONSILLECTOMY      Family Psychiatric History: Unknown  Family History:  Family History  Problem Relation Age of Onset   Heart disease Mother    Kidney disease Mother    COPD Mother    Lupus Mother    Heart disease Father    Breast cancer Maternal Grandmother    Diabetes Maternal Grandfather     Social History:  Social History   Socioeconomic History   Marital status: Divorced    Spouse name: Not on file   Number of children: 0   Years of education: GED    Highest education level: Not on file  Occupational History   Occupation: DISABLED   Occupation:    Tobacco Use   Smoking status: Former    Packs/day: 2.00    Years: 16.00    Pack years: 32.00    Types: Cigarettes    Quit date: 04/08/2002    Years since quitting: 19.1   Smokeless tobacco: Never  Vaping Use   Vaping Use: Never used  Substance and Sexual Activity   Alcohol use: No    Alcohol/week: 0.0 standard drinks    Comment: Quit in 1987   Drug use: No    Comment: Use to take cocaine, marijuana, per patient everything. Stopped in 1987.   Sexual activity: Not Currently    Partners: Female  Other Topics Concern   Not on file  Social History Narrative   Patient lives at home alone.    Disabled.   Education GED    Both handed.   Caffeine two cups of coffee daily.      Social Determinants of Health   Financial Resource Strain: Not on file  Food Insecurity: Not on file  Transportation Needs: Not on file  Physical Activity: Not on file  Stress: Not on file  Social Connections: Not on file    Allergies:  Allergies  Allergen Reactions   Morphine And Related Shortness Of Breath   Diazepam Other (See Comments)    Ringing in ears, diarrhea, CNS issues-multiple reactions     Metabolic Disorder Labs: Lab Results  Component Value Date   HGBA1C 5.5 03/24/2020   MPG 111.15 03/24/2020   MPG 111 09/15/2012   No results found for: PROLACTIN Lab Results  Component Value Date   CHOL 170 03/24/2020   TRIG 44 03/24/2020   HDL 62 03/24/2020   CHOLHDL 2.7 03/24/2020   VLDL 9 03/24/2020   LDLCALC 99 03/24/2020   LDLCALC 74 08/16/2019   Lab Results  Component Value Date   TSH 2.266 03/24/2020   TSH 3.362 12/14/2019    Therapeutic Level Labs: No results found for: LITHIUM No results found for: VALPROATE No components found for:  CBMZ  Current Medications: Current Outpatient Medications  Medication Sig Dispense Refill   acetaminophen (TYLENOL) 325 MG tablet Take  2 tablets (650 mg total) by mouth every 6 (six) hours as needed for mild pain. (Patient not taking: Reported on 04/11/2020) 10 tablet    alum & mag hydroxide-simeth (MAALOX/MYLANTA) 200-200-20 MG/5ML suspension Take 30 mLs by mouth every 4 (four) hours as needed for indigestion. (Patient not taking: Reported on 04/11/2020) 355 mL 0   amitriptyline (ELAVIL) 10 MG tablet Take 1 tablet (10 mg total) by mouth at bedtime. 30 tablet 3   gabapentin (NEURONTIN) 300 MG capsule Take 1 capsule (300 mg total) by mouth 3 (three) times daily. 90 capsule 2   haloperidol (HALDOL)  5 MG tablet Take 1 tablet (5 mg total) by mouth every 6 (six) hours as needed for agitation (severe anxiety). (Patient not taking: Reported on 04/11/2020) 30 tablet 0   magnesium hydroxide (MILK OF MAGNESIA) 400 MG/5ML suspension Take 30 mLs by mouth daily as needed for mild constipation. (Patient not taking: Reported on 04/11/2020) 355 mL 0   OVER THE COUNTER MEDICATION Take 1 drop by mouth as needed (anxiety). 1 dropper full of CBD oil (Patient not taking: Reported on 04/19/2020)     QUEtiapine (SEROQUEL) 50 MG tablet Take 1 tablet (50 mg total) by mouth at bedtime. (Patient not taking: Reported on 04/11/2020) 30 tablet 0   sodium chloride (OCEAN) 0.65 % SOLN nasal spray Place 1 spray into both nostrils as needed for congestion. (Patient not taking: Reported on 04/19/2020)     sucralfate (CARAFATE) 1 g tablet Take 1 tablet (1 g total) by mouth once for 1 dose. (Patient not taking: Reported on 04/11/2020) 1 tablet 0   No current facility-administered medications for this visit.     Musculoskeletal: Strength & Muscle Tone:  Unable to assess due to telephone visit Gait & Station:  Unable to assess due to telephone visit Patient leans: N/A  Psychiatric Specialty Exam: Review of Systems  There were no vitals taken for this visit.There is no height or weight on file to calculate BMI.  General Appearance:  Unable to assess due to telephone visit  Eye  Contact:   Unable to assess due to telephone visit  Speech:  Clear and Coherent and Pressured  Volume:  Increased  Mood:  Anxious and Depressed  Affect:   Unable to assess due to telephone visit  Thought Process:  Coherent, Goal Directed and Linear  Orientation:  Full (Time, Place, and Person)  Thought Content: WDL and Logical   Suicidal Thoughts:  No  Homicidal Thoughts:  No  Memory:  Immediate;   Good Recent;   Good Remote;   Good  Judgement:  Fair  Insight:  Fair  Psychomotor Activity:   Unable to assess due to telephone visit.   Concentration:  Concentration: Good and Attention Span: Good  Recall:  Good  Fund of Knowledge: Good  Language: Good  Akathisia:   Unable to assess due to telephone visit  Handed:  Right  AIMS (if indicated): Not done  Assets:  Communication Skills Desire for Improvement Housing Leisure Time Social Support  ADL's:  Intact  Cognition: WNL  Sleep:  Fair   Screenings: GAD-7    Flowsheet Row Video Visit from 07/12/2020 in [place] from 04/19/2020 in Primary Care at [facility]  Total GAD-7 Score 19 [place] Visit from 02/28/2014 in [facility]  Total Score (max 30 points ) 29      PHQ2-9    [place] from 07/20/2020 in [facility] Video Visit from 07/12/2020 in [place] from 04/19/2020 in Primary Care at [facility] ED from 03/24/2020 in [facility] Office Visit from 09/15/2019 in [place]  PHQ-2 Total Score 6 4 3 6 6   PHQ-9 Total Score 24 16 12 24 23       Flowsheet Row ED from 03/22/2021 in [facility] ED from 10/01/2020 in [place] DEPT Counselor from 07/20/2020 in [place]  CATEGORY No Risk  No Risk Error: Q3, 4, or 5 should not be populated when Q2 is No        Assessment and Plan: Patient endorses symptoms of anxiety and depression.  At this time patient notes that he does not want to restart psychiatric medications.  He reports he will follow-up as needed.  1. Generalized anxiety disorder     Follow up as needed  Salley Slaughter, NP 05/22/2021, 9:21 AM

## 2021-05-25 ENCOUNTER — Telehealth (HOSPITAL_COMMUNITY): Payer: Self-pay | Admitting: *Deleted

## 2021-05-25 NOTE — Telephone Encounter (Signed)
VM from patient asking that Dr Ronne Binning call him. She is not working today, will leave this message for her to follow up with him on Monday when she returns to work.

## 2021-05-27 ENCOUNTER — Emergency Department (HOSPITAL_COMMUNITY)
Admission: EM | Admit: 2021-05-27 | Discharge: 2021-05-27 | Disposition: A | Discharge status: Home / Self Care | Payer: Medicare (Managed Care) | Attending: Emergency Medicine | Admitting: Emergency Medicine

## 2021-05-27 ENCOUNTER — Emergency Department (HOSPITAL_COMMUNITY): Payer: Medicare (Managed Care)

## 2021-05-27 ENCOUNTER — Other Ambulatory Visit: Payer: Self-pay

## 2021-05-27 ENCOUNTER — Encounter (HOSPITAL_COMMUNITY): Payer: Self-pay | Admitting: Emergency Medicine

## 2021-05-27 DIAGNOSIS — F419 Anxiety disorder, unspecified: Secondary | ICD-10-CM | POA: Insufficient documentation

## 2021-05-27 DIAGNOSIS — R051 Acute cough: Secondary | ICD-10-CM | POA: Insufficient documentation

## 2021-05-27 DIAGNOSIS — R069 Unspecified abnormalities of breathing: Secondary | ICD-10-CM | POA: Insufficient documentation

## 2021-05-27 DIAGNOSIS — R202 Paresthesia of skin: Secondary | ICD-10-CM | POA: Insufficient documentation

## 2021-05-27 DIAGNOSIS — R059 Cough, unspecified: Secondary | ICD-10-CM | POA: Insufficient documentation

## 2021-05-27 MED ORDER — DEXAMETHASONE 4 MG PO TABS: 6.0000 mg | ORAL_TABLET | Freq: Once | ORAL | Status: DC

## 2021-05-27 MED ORDER — HYDROXYZINE HCL 25 MG PO TABS: 25.0000 mg | ORAL_TABLET | Freq: Three times a day (TID) | ORAL | 0 refills | Status: DC | PRN

## 2021-05-27 MED ORDER — ALBUTEROL SULFATE HFA 108 (90 BASE) MCG/ACT IN AERS
2.0000 | INHALATION_SPRAY | Freq: Once | RESPIRATORY_TRACT | Status: AC
  Administered 2021-05-27: 2 via RESPIRATORY_TRACT
  Filled 2021-05-27: qty 6.7

## 2021-05-27 NOTE — ED Triage Notes (Signed)
Per EMS pt c/o pins and needle feeling all over his body, per pt he has been withdrawing from benzos x 2 years, v/s WNL

## 2021-05-28 NOTE — Telephone Encounter (Signed)
Patient notes that he continues to be in pain. He informed Probation officer that he is attempting to call his pain management MD. He asked provider that if he is unable to get schedule if provider would be agreeable to refer him to pain management. Provider was agreeable to this request. No other concerns noted at this time.

## 2021-05-29 NOTE — ED Provider Notes (Signed)
[facility] DEPT Provider Note   CSN: [id number] Arrival date & time: 05/27/21  0451     History  Chief Complaint  Patient presents with   Numbness    [name] is a 66 y.o. male.  Patient here with parethesias of BLE associated with anxiety, coughing and tremors of his head/upper extremities. Has had this before with physical stresses, mental stresses, medication changes. No fevers. No new neuro symptoms.        Home Medications Prior to Admission medications   Medication Sig Start Date End Date Taking? Authorizing Provider  hydrOXYzine (ATARAX) 25 MG tablet Take 1 tablet (25 mg total) by mouth every 8 (eight) hours as needed. 05/27/21  Yes Tri Chittick, Corene Cornea, MD  amitriptyline (ELAVIL) 10 MG tablet Take 1 tablet (10 mg total) by mouth at bedtime. 03/15/21   Salley Slaughter, NP  gabapentin (NEURONTIN) 300 MG capsule Take 1 capsule (300 mg total) by mouth 3 (three) times daily. 03/15/21   Salley Slaughter, NP      Allergies    Morphine and related and Diazepam    Review of Systems   Review of Systems  Physical Exam Updated Vital Signs BP (!) 162/99    Pulse 81    Temp 97.7 F (36.5 C) (Oral)    Resp 19    Ht 5\' 7"  (1.702 m)    Wt 63.5 kg    SpO2 97%    BMI 21.93 kg/m  Physical Exam Vitals and nursing note reviewed.  Constitutional:      Appearance: He is well-developed.  HENT:     Head: Normocephalic and atraumatic.     Mouth/Throat:     Mouth: Mucous membranes are moist.     Pharynx: Oropharynx is clear.  Eyes:     Pupils: Pupils are equal, round, and reactive to light.  Cardiovascular:     Rate and Rhythm: Normal rate.  Pulmonary:     Effort: Pulmonary effort is normal. No respiratory distress.  Abdominal:     General: There is no distension.  Musculoskeletal:        General: Normal range of motion.     Cervical back: Normal range of motion.  Skin:    General: Skin is warm and dry.  Neurological:     General: No focal  deficit present.     Mental Status: He is alert.     Comments:  Has paresthesias and voluntary tremors. No focal neuro changes. Good strength in BLE and BUE. No obvious CN abnormalities.     ED Results / Procedures / Treatments   Labs (all labs ordered are listed, but only abnormal results are displayed) Labs Reviewed - No data to display  EKG None  Radiology No results found.  Procedures Procedures    Medications Ordered in ED Medications  albuterol (VENTOLIN HFA) 108 (90 Base) MCG/ACT inhaler 2 puff (2 puffs Inhalation Given 05/27/21 2542)    ED Course/ Medical Decision Making/ A&P                           Medical Decision Making Amount and/or Complexity of Data Reviewed Radiology: ordered.  Risk Prescription drug management.   Paresthesias associated with anxiety. Low suspicion for seizure, stroke or other neuro emergency.    Final Clinical Impression(s) / ED Diagnoses Final diagnoses:  Paresthesia    Rx / DC Orders ED Discharge Orders  Ordered    hydrOXYzine (ATARAX) 25 MG tablet  Every 8 hours PRN        05/27/21 0647              Ranita Stjulien, Corene Cornea, MD 05/29/21 2335

## 2021-06-03 ENCOUNTER — Ambulatory Visit (HOSPITAL_COMMUNITY)
Admission: EM | Admit: 2021-06-03 | Discharge: 2021-06-03 | Disposition: A | Discharge status: Home / Self Care | Payer: Medicare (Managed Care)

## 2021-06-03 DIAGNOSIS — M5440 Lumbago with sciatica, unspecified side: Secondary | ICD-10-CM

## 2021-06-03 DIAGNOSIS — G8929 Other chronic pain: Secondary | ICD-10-CM

## 2021-06-03 DIAGNOSIS — F411 Generalized anxiety disorder: Secondary | ICD-10-CM

## 2021-06-03 NOTE — Discharge Instructions (Signed)
Take all medications as prescribed. Keep all follow-up appointments as scheduled.  Do not consume alcohol or use illegal drugs while on prescription medications. Report any adverse effects from your medications to your primary care provider promptly.  In the event of recurrent symptoms or worsening symptoms, call 911, a crisis hotline, or go to the nearest emergency department for evaluation.   

## 2021-06-03 NOTE — ED Notes (Signed)
Pt had questions regarding his prescription given on 05/27/21. Opened chart to try and answer questions. Pt become upset when writer explained that he was given a prescription to take to the pharmacy to be filled, we are unable to assist due to the amt of time between visit and return call.

## 2021-06-03 NOTE — BH Assessment (Addendum)
Comprehensive Clinical Assessment (CCA) Screening, Triage and Referral Note  06/03/2021 [name] [id number]  Triage/Screening Disposition: Triage/Screening completed. Patient to be evaluated by the [facility] providers and a disposition is to be determined. Patient is Routine.   Chief Complaint: "I am in a lot of pain". "I need "therapy" and "just talk to somebody".  Visit Diagnosis: MDD recurrent without psychotic features, severe  [name] is a 66 y.o.male with a history of anxiety who presents voluntarily to [facility]. He reports being in alot of pain. He does appear to be experiencing some discomfort as he is very restless.   Clinician further observes to be distressed, as he attempts to speak to this Clinician and  the [facility] provider Samuel Ala, NP) who was present and witnessed his demeanor.   He informs [place] both that he is here for "therapy" and "just talk to somebody". He goes on to explain that he tries to make appointments for therapy but it's difficult to maintain his appointments for various reasons. He mentions, "I never know how I'm going to feel on those appointment days". As patient further providers information he has rambling speech and is difficulty to follow. He appears to be providing additional reasons why he is unable to make and/or keep therapy appointments.   He seems to be upset about the fact that he has sought medical help in the Emergency Departments an/or Urgent Cares, reported to providers he is in a lot of pain. However, they tell him that he is "lying". Patient states, "I know how I feel, they don't".  He denies SI, HI, and AVH's. He was asked about alcohol and drug use and states, "I've only drank alcohol 3 days in a row in my life". Upon chart review, pt has history of benzodiazepine dependence, alcohol use disorder, and MAT's for  opioid pain medication use. Patient is noted to have a long hx of substance abuse along with anxiety, and Valium has has often been  his drug of choice to maintain his anxiety. Although, he denies SI today, no plan, no intent on today's visit; upon chart review on 03/24/2020, has threatened to take his own life and taking his dogs life.   The chart review further indicates that the [facility] outpatient provider [facility] [name]) has provided medication management to patient. Clinician is unclear if patient is compliant with medication managment at this time.   Overall, patient is intermittently pacing, restless, tangential with pressured speech, anxious, and appears to have signs of racing thoughts.   He is willing to try outpatient therapy virtually if recommended by the [facility] provider.  Patient Reported Information How did you hear about [place]? Self/Voluntary  What Is the Reason for Your Visit/Call Today?   How Long Has This Been Causing You Problems? > than 6 months  What Do You Feel Would Help You the Most Today? Treatment for Depression or other mood problem; Stress Management   Have You Recently Had Any Thoughts About Hurting Yourself? No  Are You Planning to Commit Suicide/Harm Yourself At This time? No   Have you Recently Had Thoughts About [place]? No  Are You Planning to Harm Someone at This Time? No  Explanation: No data recorded  Have You Used Any Alcohol or Drugs in the Past 24 Hours? No  How Long Ago Did You Use Drugs or Alcohol? No data recorded What Did You Use and How Much? No data recorded  Do You Currently Have a Therapist/Psychiatrist? No data recorded Name of  Therapist/Psychiatrist: No data recorded  Have You Been Recently Discharged From Any Office Practice or Programs? No data recorded Explanation of Discharge From Practice/Program: No data recorded   CCA Screening Triage Referral Assessment Type of Contact: No data recorded Telemedicine Service Delivery:   Is this Initial or Reassessment? No data recorded Date Telepsych consult ordered in [facility]:  No data recorded Time  Telepsych consult ordered in [facility]:  No data recorded Location of Assessment: No data recorded Provider Location: No data recorded  Collateral Involvement: No data recorded  Does Patient Have a [place]? No data recorded Name and Contact of Legal Guardian: No data recorded If Minor and Not Living with Parent(s), Who has Custody? No data recorded Is CPS involved or ever been involved? No data recorded Is APS involved or ever been involved? No data recorded  Patient Determined To Be At Risk for Harm To Self or Others Based on Review of Patient Reported Information or Presenting Complaint? No data recorded Method: No data recorded Availability of Means: No data recorded Intent: No data recorded Notification Required: No data recorded Additional Information for Danger to Others Potential: No data recorded Additional Comments for Danger to Others Potential: No data recorded Are There Guns or Other Weapons in Your Home? No data recorded Types of Guns/Weapons: No data recorded Are These Weapons Safely Secured?                            No data recorded Who Could Verify You Are Able To Have These Secured: No data recorded Do You Have any Outstanding Charges, Pending Court Dates, Parole/Probation? No data recorded Contacted To Inform of Risk of Harm To Self or Others: No data recorded  Does Patient Present under Involuntary Commitment? No data recorded IVC Papers Initial File Date: No data recorded  [place] of Residence: No data recorded  Patient Currently Receiving the Following Services: No data recorded  Determination of Need: Routine (7 days)   Options For Referral: Outpatient Therapy; Medication Management   Discharge Disposition:     [name], Counselor

## 2021-06-03 NOTE — ED Provider Notes (Addendum)
Behavioral Health Urgent Care Medical Screening Exam  Patient Name: [name] MRN: [id number] Date of Evaluation: 06/03/21 Chief Complaint:   Diagnosis:  Final diagnoses:  GAD (generalized anxiety disorder)  Chronic midline low back pain with sciatica, sciatica laterality unspecified    History of Present illness: [name] is a 66 y.o. male. Presents to [facility] requesting to talk to a therapist.  He is denying suicidal or homicidal ideations.  Denies auditory visual hallucinations.  Reports increased anxiety related to pain.  States he has been homebound for the past week and has missed all outpatient follow-up appointments for pain management and therapy appointments.  [name] states"I  that just need to talk to somebody and I need human contact."  Reports he has been prescribed multiple medications in the past to help with pain anxiety and depression however states he no longer takes medications because he feels as if the medication is making his pain and anxiety worse.  States he only uses marijuana and kratom to help with his symptoms.  NP provided education with side effects to illicit drug/substance abuse patient was reluctant to information.  He is requesting to be started on a new benzodiazepine withdrawal medication.  Patient encouraged to follow-up with partial hospitalization programming patient declined. patient to keep all follow-up appointment with outpatient providers.  Support encouragement reassurance was provided.  [name], 66 y.o., male patient seen face to face by this provider and chart reviewed on 06/03/21.  On evaluation [name] reports " I just want someone to talk too."    During evaluation [name] is siting  in no acute distress. he is alert/oriented x 4; calm/cooperative; and mood congruent with affect.  he is speaking in a clear tone at moderate volume, and normal pace; with good eye contact.his  thought process is coherent and  relevant; There is no indication that he is currently responding to internal/external stimuli or experiencing delusional thought content; and he has denied suicidal/self-harm/homicidal ideation, psychosis, and paranoia.   Patient has remained calm throughout assessment and has answered questions appropriately.     At this time [name] is educated and verbalizes understanding of mental health resources and other crisis services in the community. He is instructed to call 911 and present to the nearest emergency room should he experience any suicidal/homicidal ideation, auditory/visual/hallucinations, or detrimental worsening of his  mental health condition.  He was a also advised by Probation officer that he  could call the toll-free phone on insurance card to assist with identifying in network counselors and agencies or number on back of Medicaid card t speak with care coordinator    Psychiatric Specialty Exam  Presentation  General Appearance:Appropriate for Environment  Eye Contact:Good  Speech:Clear and Coherent  Speech Volume:Normal  Handedness:Right   Mood and Affect  Mood:Anxious; Depressed Affect:Congruent  Thought Process  Thought Processes:Coherent Descriptions of Associations:Intact  Orientation:Full (Time, Place and Person)  Thought Content:Logical  Diagnosis of Schizophrenia or Schizoaffective disorder in past: No data recorded  Hallucinations:None  Ideas of Reference:None  Suicidal Thoughts:No  Homicidal Thoughts:No   Sensorium  Memory:Recent Good; Immediate Good Judgment:Fair Insight:Fair  Executive Functions  Concentration:Fair Attention Span:Good [place]  Psychomotor Activity  Psychomotor Activity:Normal Other (comment)  Assets  Assets:Desire for Improvement  Sleep  Sleep:Fair Number of hours: No data recorded  Nutritional Assessment (For OBS and FBC admissions only) Has the patient had a weight loss or  gain of 10 pounds or more in the last  3 months?: No Has the patient had a decrease in food intake/or appetite?: No Does the patient have dental problems?: No Does the patient have eating habits or behaviors that may be indicators of an eating disorder including binging or inducing vomiting?: No Has the patient recently lost weight without trying?: 0 Has the patient been eating poorly because of a decreased appetite?: 0 Malnutrition Screening Tool Score: 0    Physical Exam: Physical Exam Vitals reviewed.  HENT:     Head: Normocephalic.  Cardiovascular:     Rate and Rhythm: Tachycardia present.  Pulmonary:     Effort: Pulmonary effort is normal.  Abdominal:     General: Abdomen is flat.  Neurological:     Mental Status: He is alert and oriented to person, place, and time.  Psychiatric:        Mood and Affect: Mood normal.        Behavior: Behavior normal.        Thought Content: Thought content normal.   Review of Systems  HENT: Negative.    Eyes: Negative.   Cardiovascular: Negative.   Gastrointestinal: Negative.   Genitourinary: Negative.   Musculoskeletal: Negative.   Skin: Negative.   Neurological: Negative.   Endo/Heme/Allergies: Negative.   Psychiatric/Behavioral:  Positive for depression. Negative for hallucinations and suicidal ideas. The patient is nervous/anxious.   All other systems reviewed and are negative. Pulse (!) 103, temperature 98.1 F (36.7 C), temperature source Oral, resp. rate 18, SpO2 (!) 80 %. There is no height or weight on file to calculate BMI.  Musculoskeletal: Strength & Muscle Tone: within normal limits Gait & Station: normal Patient leans: N/A   [place] [facility] Discharge Disposition for Follow up and Recommendations: Based on my evaluation the patient does not appear to have an emergency medical condition and can be discharged with resources and follow up care in outpatient services for Partial Hospitalization Program and Individual  Therapy   [name], NP 06/03/2021, 3:40 PM

## 2021-06-13 ENCOUNTER — Telehealth (HOSPITAL_COMMUNITY): Payer: Self-pay | Admitting: *Deleted

## 2021-06-13 NOTE — Telephone Encounter (Signed)
VM from patient, Probation officer called him back. He wanted to speak with Brittney his provider. Explained she was out on leave till May. He said the person covering for her wouldn't know what he was talking about, so he would wait till he was having a better day, he has a headache today, and would call to make a future appt.  ?

## 2021-06-19 ENCOUNTER — Emergency Department (HOSPITAL_COMMUNITY)
Admission: EM | Admit: 2021-06-19 | Discharge: 2021-06-19 | Disposition: A | Discharge status: Home / Self Care | Payer: Medicare (Managed Care) | Attending: Emergency Medicine | Admitting: Emergency Medicine

## 2021-06-19 ENCOUNTER — Encounter (HOSPITAL_COMMUNITY): Payer: Self-pay | Admitting: Emergency Medicine

## 2021-06-19 DIAGNOSIS — F4542 Pain disorder with related psychological factors: Secondary | ICD-10-CM | POA: Diagnosis not present

## 2021-06-19 DIAGNOSIS — R519 Headache, unspecified: Secondary | ICD-10-CM | POA: Insufficient documentation

## 2021-06-19 DIAGNOSIS — F419 Anxiety disorder, unspecified: Secondary | ICD-10-CM | POA: Diagnosis present

## 2021-06-19 DIAGNOSIS — G8929 Other chronic pain: Secondary | ICD-10-CM | POA: Insufficient documentation

## 2021-06-19 DIAGNOSIS — G894 Chronic pain syndrome: Secondary | ICD-10-CM

## 2021-06-19 DIAGNOSIS — R059 Cough, unspecified: Secondary | ICD-10-CM | POA: Diagnosis not present

## 2021-06-19 DIAGNOSIS — M79673 Pain in unspecified foot: Secondary | ICD-10-CM | POA: Diagnosis not present

## 2021-06-19 MED ORDER — ACETAMINOPHEN 500 MG PO TABS: 1000.0000 mg | ORAL_TABLET | Freq: Once | ORAL | Status: DC

## 2021-06-19 MED ORDER — KETOROLAC TROMETHAMINE 15 MG/ML IJ SOLN: 15.0000 mg | Freq: Once | INTRAMUSCULAR | Status: DC

## 2021-06-19 NOTE — Discharge Instructions (Signed)
You need to follow back up with your family doctor for this.  If they feel that pain management might help that they may refer you. ?

## 2021-06-19 NOTE — ED Provider Notes (Signed)
?[name] DEPT ?Provider Note ? ? ?CSN: [id number] ?Arrival date & time: 06/19/21  1052 ? ?  ? ?History ? ?Chief Complaint  ?Patient presents with  ? Anxiety  ? ? ?[name] is a 66 y.o. male. ? ?66 yo M with a chief complaints of diffuse pain.  Mostly in the feet and in the face.  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  He has been trying to find a doctor that is going to treat him for this.  He has been seen at behavioral health for this somewhat recently.  He denies recent trauma or any significant change in his chronic symptoms. ? ? ?Anxiety ? ? ?  ? ?Home Medications ?Prior to Admission medications   ?Medication Sig Start Date End Date Taking? Authorizing Provider  ?amitriptyline (ELAVIL) 10 MG tablet Take 1 tablet (10 mg total) by mouth at bedtime. 03/15/21   Salley Slaughter, NP  ?gabapentin (NEURONTIN) 300 MG capsule Take 1 capsule (300 mg total) by mouth 3 (three) times daily. 03/15/21   Salley Slaughter, NP  ?hydrOXYzine (ATARAX) 25 MG tablet Take 1 tablet (25 mg total) by mouth every 8 (eight) hours as needed. 05/27/21   Mesner, Corene Cornea, MD  ?   ? ?Allergies    ?Morphine and related and Diazepam   ? ?Review of Systems   ?Review of Systems ? ?Physical Exam ?Updated Vital Signs ?BP (!) 148/104   Pulse 98   Temp 98.4 ?F (36.9 ?C) (Oral)   Resp (!) 23   Ht '5\' 7"'$  (1.702 m)   Wt 68 kg   SpO2 96%   BMI 23.49 kg/m?  ?Physical Exam ?Vitals and nursing note reviewed.  ?Constitutional:   ?   Appearance: He is well-developed.  ?   Comments: Patient grunts and coughs frequently, is distractible with conversation.  ?HENT:  ?   Head: Normocephalic and atraumatic.  ?Eyes:  ?   Pupils: Pupils are equal, round, and reactive to light.  ?Neck:  ?   Vascular: No JVD.  ?Cardiovascular:  ?   Rate and Rhythm: Normal rate and regular rhythm.  ?   Heart sounds: No murmur heard. ?  No friction rub. No gallop.   ?Pulmonary:  ?   Effort: No respiratory distress.  ?   Breath sounds: No wheezing.  ?Abdominal:  ?   General: There is no distension.  ?   Tenderness: There is no abdominal tenderness. There is no guarding or rebound.  ?Musculoskeletal:     ?   General: Normal range of motion.  ?   Cervical back: Normal range of motion and neck supple.  ?   Comments: Reflexes are 2+ and equal to bilateral lower extremities.  Negative straight leg raise test.  No midline spinal tenderness step-offs or deformities.  ?Skin: ?   Coloration: Skin is not pale.  ?   Findings: No rash.  ?Neurological:  ?   Mental Status: He is alert and oriented to person, place, and time.  ?Psychiatric:     ?   Behavior: Behavior normal.  ? ? ?ED Results / Procedures / Treatments   ?Labs ?(all labs ordered are listed, but only abnormal results are displayed) ?Labs Reviewed - No data to display ? ?EKG ?None ? ?Radiology ?No results found. ? ?Procedures ?Procedures  ? ? ?Medications Ordered in ED ?Medications  ?ketorolac (TORADOL) 15 MG/ML injection 15 mg (has  no administration in time range)  ?acetaminophen (TYLENOL) tablet 1,000 mg (has no administration in time range)  ? ? ?ED Course/ Medical Decision Making/ A&P ?  ?                        ?Medical Decision Making ?Risk ?OTC drugs. ?Prescription drug management. ? ? ?66 yo M with a chief complaints of pain.  This is mostly to the face into the feet.  Has been going on for quite some time.  He feels like he needs help with syndrome that he had looked up online.  No obvious need for acute medical intervention here.  Suggest that he follow-up with his family doctor to try and establish care with pain management or potentially psychiatry. ? ?3:30 PM:  I have discussed the diagnosis/risks/treatment options with the patient.  Evaluation and diagnostic testing in the emergency department does not suggest an emergent condition requiring admission or immediate intervention beyond what has been performed at this  time.  They will follow up with  PCP. We also discussed returning to the ED immediately if new or worsening sx occur. We discussed the sx which are most concerning (e.g., sudden worsening pain, fever, inability to tolerate by mouth) that necessitate immediate return. Medications administered to the patient during their visit and any new prescriptions provided to the patient are listed below. ? ?Medications given during this visit ?Medications  ?ketorolac (TORADOL) 15 MG/ML injection 15 mg (has no administration in time range)  ?acetaminophen (TYLENOL) tablet 1,000 mg (has no administration in time range)  ? ? ? ?The patient appears reasonably screen and/or stabilized for discharge and I doubt any other medical condition or other [facility] requiring further screening, evaluation, or treatment in the ED at this time prior to discharge.  ? ? ? ? ? ? ? ? ?Final Clinical Impression(s) / ED Diagnoses ?Final diagnoses:  ?Chronic pain disorder  ? ? ?Rx / DC Orders ?ED Discharge Orders   ? ? None  ? ?  ? ? ?  ?Deno Etienne, DO ?06/19/21 1530 ? ?

## 2021-06-19 NOTE — ED Triage Notes (Signed)
Per EMS-states he has been off benzo's for over a year-having numerological pain due to being off meds-states symptoms cause him to be anxious and in pain ?

## 2021-06-19 NOTE — ED Notes (Signed)
While trying to have conversation pt become upset. Pt walked/stomped out of department swinging arms around stating he does not have a doctor. Due to leaving meds were not given. ?

## 2021-06-24 ENCOUNTER — Emergency Department (HOSPITAL_COMMUNITY)
Admission: EM | Admit: 2021-06-24 | Discharge: 2021-06-24 | Disposition: A | Discharge status: Home / Self Care | Payer: Medicare (Managed Care) | Attending: Emergency Medicine | Admitting: Emergency Medicine

## 2021-06-24 ENCOUNTER — Other Ambulatory Visit: Payer: Self-pay

## 2021-06-24 ENCOUNTER — Encounter (HOSPITAL_COMMUNITY): Payer: Self-pay

## 2021-06-24 DIAGNOSIS — M791 Myalgia, unspecified site: Secondary | ICD-10-CM | POA: Insufficient documentation

## 2021-06-24 DIAGNOSIS — R059 Cough, unspecified: Secondary | ICD-10-CM | POA: Diagnosis not present

## 2021-06-24 DIAGNOSIS — F419 Anxiety disorder, unspecified: Secondary | ICD-10-CM | POA: Diagnosis present
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
## 2021-06-24 NOTE — ED Provider Notes (Signed)
?[place] DEPT ?Provider Note ? ? ?CSN: [id number] ?Arrival date & time: 06/24/21  0744 ? ?  ? ?History ? ?Chief Complaint  ?Patient presents with  ? chronic body pain  ? ? ?[name] is a 66 y.o. male. ? ?HPI ?Patient presents by EMS for evaluation of generalized pain.  He states he was prescribed gabapentin for it but cannot take it because it makes things worse.  He also feels like he is continually withdrawing from benzodiazepines after he stopped taking Valium several months ago.  He is vague about when he actually used it and other medicines.  He is also not clear about his recent medical history.  He states he was seeing a pain doctor, but is unable to specify what was done.  He states he does not continue to see the pain doctor but he has "oxycodone at home but it does not help."  He states he has problems sleeping. ?  ? ?Home Medications ?Prior to Admission medications   ?Medication Sig Start Date End Date Taking? Authorizing Provider  ?amitriptyline (ELAVIL) 10 MG tablet Take 1 tablet (10 mg total) by mouth at bedtime. 03/15/21   Salley Slaughter, NP  ?gabapentin (NEURONTIN) 300 MG capsule Take 1 capsule (300 mg total) by mouth 3 (three) times daily. 03/15/21   Salley Slaughter, NP  ?hydrOXYzine (ATARAX) 25 MG tablet Take 1 tablet (25 mg total) by mouth every 8 (eight) hours as needed. 05/27/21   Mesner, Corene Cornea, MD  ?   ? ?Allergies    ?Morphine and related and Diazepam   ? ?Review of Systems   ?Review of Systems ? ?Physical Exam ?Updated Vital Signs ?BP (!) 142/111 (BP Location: Right Arm)   Pulse 96   Temp 97.8 ?F (36.6 ?C) (Oral)   Resp 20   Ht '5\' 7"'$  (1.702 m)   Wt 68 kg   SpO2 95%   BMI 23.49 kg/m?  ?Physical Exam ?Vitals and nursing note reviewed.  ?Constitutional:   ?   General: He is not in acute distress. ?   Appearance: He is well-developed. He is not ill-appearing, toxic-appearing or diaphoretic.  ?HENT:  ?   Head: Normocephalic and atraumatic.  ?   Right  Ear: External ear normal.  ?   Left Ear: External ear normal.  ?Eyes:  ?   Conjunctiva/sclera: Conjunctivae normal.  ?   Pupils: Pupils are equal, round, and reactive to light.  ?Neck:  ?   Trachea: Phonation normal.  ?Cardiovascular:  ?   Rate and Rhythm: Normal rate.  ?Pulmonary:  ?   Effort: Pulmonary effort is normal.  ?   Comments: Patient intermittently is having a cough, which is nonproductive, and sounds more like a tic, made by moving the hypopharyngeal structures.  This happens frequently, when not talking, then abates while he talks to me. ?Abdominal:  ?   General: There is no distension.  ?Musculoskeletal:     ?   General: Normal range of motion.  ?   Cervical back: Normal range of motion and neck supple.  ?Skin: ?   General: Skin is warm and dry.  ?Neurological:  ?   Mental Status: He is alert and oriented to person, place, and time.  ?   Cranial Nerves: No cranial nerve deficit.  ?   Sensory: No sensory deficit.  ?   Motor: No abnormal muscle tone.  ?   Coordination: Coordination normal.  ?Psychiatric:     ?  Mood and Affect: Mood is anxious.     ?   Speech: He is communicative. Speech is rapid and pressured.     ?   Behavior: Behavior normal. Behavior is not agitated or aggressive.     ?   Thought Content: Thought content normal. Thought content is not paranoid or delusional.     ?   Cognition and Memory: Cognition is not impaired.     ?   Judgment: Judgment normal.  ? ? ?ED Results / Procedures / Treatments   ?Labs ?(all labs ordered are listed, but only abnormal results are displayed) ?Labs Reviewed - No data to display ? ?EKG ?None ? ?Radiology ?No results found. ? ?Procedures ?Procedures  ? ? ?Medications Ordered in ED ?Medications - No data to display ? ?ED Course/ Medical Decision Making/ A&P ?Clinical Course as of 06/24/21 0907  ?Sun Jun 24, 2021  ?4128 The patient tells me to "look at the black box warning," regarding benzodiazepine withdrawal. [EW]  ?  ?Clinical Course User Index ?[EW]  Daleen Bo, MD  ? ?                        ?Medical Decision Making ?Patient with chronic symptoms, presenting with fixation on chronic symptoms from benzodiazepine withdrawal.  Reviewing PDMP data, no recent prescription for any scheduled drugs are listed.  He was in the ED 5 days ago for similar complaints.  He was treated symptomatically and discharged ? ?Problems Addressed: ?Anxiety: chronic illness or injury ?   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 ? ?Amount and/or Complexity of Data Reviewed ?External Data Reviewed: notes. ?   Details: Previously evaluated for similar problems, 06/03/2021.  At that time he was referred for outpatient partial hospitalization program at the [facility].  He apparently did not follow-up for that. ? ?Risk ?Risk Details: Patient presenting with concern for anxiety manifested by general body pain.  He thinks that he is withdrawing from benzodiazepine which he took at an undetermined prior time, at least several months ago.  The PDMP data does not show any scheduled narcotics or benzodiazepines given and recorded in the databank.  The patient was offered hydroxyzine, in the ED today which he declined.  The patient is aggressive, demanding and unwilling to participate in discussion for a care plan.  There is no indication for further ED evaluation, hospitalization either medically or psychiatrically at this time. ? ? ? ? ? ? ? ? ? ? ?Final Clinical Impression(s) / ED Diagnoses ?Final diagnoses:  ?Anxiety  ? ? ?Rx / DC Orders ?ED Discharge Orders   ? ? None  ? ?  ? ? ?  ?Daleen Bo, MD ?06/24/21 [phone] ? ?

## 2021-06-24 NOTE — Discharge Instructions (Signed)
It appears that you have anxiety causing your discomfort.  We have offered you hydroxyzine to treat that which you have declined.  We recommend that you follow-up at the behavioral health urgent care for further care and treatment of your discomfort. ?

## 2021-08-20 ENCOUNTER — Telehealth (HOSPITAL_COMMUNITY): Payer: Self-pay | Admitting: *Deleted

## 2021-08-20 NOTE — Telephone Encounter (Signed)
Called asking for refill of Vistaril to help with anxiety. Chart reviewed, no appointment scheduled. Called to notify patient that he has no future appointment scheduled. Patient does not wish to schedule appointment stating he is not doing well. He is having a lot of neurological issues from past benzo use. 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 Asking if he can speak with his psychiatrist. Explained that she is on maternity leave but a message would be left the covering psychiatrist. Asking that someone call him to discuss medications. Was asking about a medication called Flumazenil. States he's not able to come in person for an appointment but is able to do a virtual visit. Would prefer not to have an appointment scheduled because he states that it causes severe anxiety but he would like someone to call him after 12pm to discuss medication adjustments. Message sent to covering physicians.  ?

## 2021-08-22 ENCOUNTER — Telehealth (HOSPITAL_COMMUNITY): Payer: Self-pay | Admitting: *Deleted

## 2021-08-22 ENCOUNTER — Other Ambulatory Visit (HOSPITAL_COMMUNITY): Payer: Self-pay | Admitting: Psychiatry

## 2021-08-22 ENCOUNTER — Ambulatory Visit (HOSPITAL_COMMUNITY)
Admission: EM | Admit: 2021-08-22 | Discharge: 2021-08-22 | Disposition: A | Discharge status: Home / Self Care | Payer: Medicare (Managed Care) | Attending: Psychiatry | Admitting: Psychiatry

## 2021-08-22 DIAGNOSIS — R070 Pain in throat: Secondary | ICD-10-CM | POA: Diagnosis not present

## 2021-08-22 DIAGNOSIS — F419 Anxiety disorder, unspecified: Secondary | ICD-10-CM | POA: Diagnosis present

## 2021-08-22 DIAGNOSIS — F410 Panic disorder [episodic paroxysmal anxiety] without agoraphobia: Secondary | ICD-10-CM | POA: Diagnosis not present

## 2021-08-22 DIAGNOSIS — Z91148 Patient's other noncompliance with medication regimen for other reason: Secondary | ICD-10-CM | POA: Insufficient documentation

## 2021-08-22 DIAGNOSIS — F1090 Alcohol use, unspecified, uncomplicated: Secondary | ICD-10-CM | POA: Insufficient documentation

## 2021-08-22 DIAGNOSIS — F319 Bipolar disorder, unspecified: Secondary | ICD-10-CM | POA: Diagnosis not present

## 2021-08-22 MED ORDER — HYDROXYZINE HCL 25 MG PO TABS: 25.0000 mg | ORAL_TABLET | Freq: Three times a day (TID) | ORAL | 0 refills | Status: DC | PRN

## 2021-08-22 NOTE — Discharge Instructions (Addendum)

## 2021-08-22 NOTE — ED Provider Notes (Signed)
Behavioral Health Urgent Care Medical Screening Exam  Patient Name: [name] MRN: [id number] Date of Evaluation: 08/22/21 Chief Complaint:   Diagnosis:  Final diagnoses:  Anxious mood    History of Present illness: [name] is a 66 y.o. male. Patient presents voluntarily to [facility] behavioral health for walk-in assessment.    [name] reports he is anxious, increasingly, since he discontinued benzodiazepine use in July 2022.  Today he would like to be prescribed hydroxyzine as directed by an outpatient therapist.  He is uncertain whether or not he will take this medication as he would like to do more research prior to initiating this medication.  He reports ongoing symptoms including generalized pain for several months.  Patient reports his back, chest, feet and hands are in constant pain.  He also reports decreased sleep for several months.  He reports he feels discomfort in his throat, feels that there is "gravel in my throat."  He also reports intermittent throat pain.  He has been followed by primary care including endoscopy, reports he has been told "it is all in your head."  He presents with repeated throat clearing throughout assessment.  Patient has been diagnosed with alcohol use disorder, bipolar disorder and panic attacks.  He is followed by outpatient psychiatry at [facility] behavioral health.  He is seen by outpatient therapy intermittently.  He is currently not taking any medications as he believes Neurontin "messed me up."  He reports he last used Neurontin in December 2022.  He also reports a negative reaction to Cymbalta in the past.  Patient is assessed face-to-face by nurse practitioner.  He is seated in assessment area, no acute distress.  He is alert and oriented, pleasant and cooperative during assessment.   He presents with anxious mood, congruent affect. He denies suicidal and homicidal ideations.  He denies history of suicide attempts, denies history of  self-harm.  He contracts verbally for safety with this Probation officer.   He has normal behavior.  He denies both auditory and visual hallucinations.  Patient is able to converse coherently with goal-directed thoughts and no distractibility or preoccupation.  He denies paranoia.  Objectively there is no evidence of psychosis/mania or delusional thinking.  [name] resides in [place], alone, he denies access to weapons.  He receives disability income.  He denies alcohol and substance use.  Patient endorses average appetite.  Patient offered support and encouragement.  Discussed potential admission to observation area, patient declines.  He declines any person to contact for collateral information at this time.  He agrees with plan to follow-up with outpatient psychiatry at [facility] behavioral health moving forward.  Patient verbalizes understanding of strict return precautions. Patient is educated and verbalizes understanding of mental health resources and other crisis services in the community. He is instructed to call 911 and present to the nearest emergency room should he experience any suicidal/homicidal ideation, auditory/visual/hallucinations, or detrimental worsening of his mental health condition.    Psychiatric Specialty Exam  Presentation  General Appearance:Appropriate for Environment; Casual  Eye Contact:Good  Speech:Clear and Coherent; Normal Rate  Speech Volume:Normal  Handedness:Right   Mood and Affect  Mood:Anxious  Affect:Congruent   Thought Process  Thought Processes:Coherent; Goal Directed; Linear  Descriptions of Associations:Intact  Orientation:Full (Time, Place and Person)  Thought Content:Logical  Diagnosis of Schizophrenia or Schizoaffective disorder in past: No data recorded  Hallucinations:None  Ideas of Reference:None  Suicidal Thoughts:No  Homicidal Thoughts:No   Sensorium  Memory:Immediate Good; Recent  Good  Judgment:Fair  Insight:Fair   Executive Functions  Concentration:Fair  Attention Span:Good  [place]  Language:Good   Psychomotor Activity  Psychomotor Activity:Normal Other (comment)   Assets  Assets:Communication Skills; Desire for Improvement; Financial Resources/Insurance; Housing; Intimacy; Leisure Time; Resilience   Sleep  Sleep:Poor  Number of hours: No data recorded  No data recorded  Physical Exam: Physical Exam Vitals and nursing note reviewed.  Constitutional:      Appearance: Normal appearance. He is well-developed and normal weight.  HENT:     Head: Normocephalic and atraumatic.     Nose: Nose normal.  Cardiovascular:     Rate and Rhythm: Normal rate.  Pulmonary:     Effort: Pulmonary effort is normal.  Musculoskeletal:        General: Normal range of motion.     Cervical back: Normal range of motion.  Skin:    General: Skin is warm and dry.  Neurological:     Mental Status: He is alert and oriented to person, place, and time.  Psychiatric:        Attention and Perception: Attention and perception normal.        Mood and Affect: Affect normal. Mood is anxious.        Speech: Speech normal.        Behavior: Behavior normal. Behavior is cooperative.        Thought Content: Thought content normal.        Cognition and Memory: Cognition and memory normal.   Review of Systems  Constitutional: Negative.   HENT: Negative.    Eyes: Negative.   Respiratory: Negative.    Cardiovascular: Negative.   Gastrointestinal: Negative.   Genitourinary: Negative.   Musculoskeletal: Negative.   Skin: Negative.   Neurological: Negative.   Endo/Heme/Allergies: Negative.   Psychiatric/Behavioral:  The patient is nervous/anxious and has insomnia.   Blood pressure 123/84, pulse 98, temperature 97.7 F (36.5 C), temperature source Oral, resp. rate 16, SpO2 97 %. There is no height or weight on file to calculate  BMI.  Musculoskeletal: Strength & Muscle Tone: within normal limits Gait & Station: normal Patient leans: N/A   [place] MSE Discharge Disposition for Follow up and Recommendations: Based on my evaluation the patient does not appear to have an emergency medical condition and can be discharged with resources and follow up care in outpatient services for Individual Therapy Patient reviewed with Dr. Serafina Mitchell. Follow-up with outpatient psychiatry, resources provided.  Lucky Rathke, FNP 08/22/2021, 8:19 PM

## 2021-08-22 NOTE — ED Triage Notes (Signed)
Pt presents to the [facility], voluntarily, reporting pain in his head, back, and all over his body. Pts presentation concurs with his self reports of "I am in pain". Pt is coughing, leaning over holding his stomach, pt reports his breathing is impaired, pt has 97% o2, and his attempts to talk are seemingly difficult for him to do. Pt reports that he has been in pain for quite a while. Pt states that he continues to seek medical assistance for his withdrawal symptoms. Pt states " I know this medication that you all can give me to help with my symptoms but nobody wants to prescribe it to me". Pt speaks clear and coherent without coughing when explaining the medication that he would like to be presribed. Pt asked by this writer if he felt like he needed to be transported to a medical facility due to his presentation. Pt denied this offer and stated that he just needs a doctor to listen to him about the medication that he thinks would help with his symptoms.Pt denies SI/HI and AVH. ?

## 2021-08-22 NOTE — Telephone Encounter (Signed)
Called him back this am with new appt on 08/29/21 at 11 am ?

## 2021-08-23 ENCOUNTER — Other Ambulatory Visit (HOSPITAL_COMMUNITY): Payer: Self-pay | Admitting: *Deleted

## 2021-08-23 ENCOUNTER — Telehealth (HOSPITAL_COMMUNITY): Payer: Self-pay | Admitting: *Deleted

## 2021-08-23 ENCOUNTER — Encounter (HOSPITAL_COMMUNITY): Payer: Self-pay

## 2021-08-23 ENCOUNTER — Emergency Department (HOSPITAL_COMMUNITY)
Admission: EM | Admit: 2021-08-23 | Discharge: 2021-08-24 | Disposition: A | Discharge status: Home / Self Care | Payer: Medicare (Managed Care) | Attending: Emergency Medicine | Admitting: Emergency Medicine

## 2021-08-23 ENCOUNTER — Other Ambulatory Visit: Payer: Self-pay

## 2021-08-23 DIAGNOSIS — I10 Essential (primary) hypertension: Secondary | ICD-10-CM | POA: Insufficient documentation

## 2021-08-23 DIAGNOSIS — M791 Myalgia, unspecified site: Secondary | ICD-10-CM | POA: Insufficient documentation

## 2021-08-23 DIAGNOSIS — R451 Restlessness and agitation: Secondary | ICD-10-CM | POA: Insufficient documentation

## 2021-08-23 DIAGNOSIS — R52 Pain, unspecified: Secondary | ICD-10-CM

## 2021-08-23 MED ORDER — HYDROXYZINE HCL 25 MG PO TABS: 25.0000 mg | ORAL_TABLET | Freq: Three times a day (TID) | ORAL | 0 refills | Status: DC | PRN

## 2021-08-23 NOTE — Telephone Encounter (Signed)
Called expecting to have a rx at his pharmacy from yesterday. Checked the chart, rx was printed not escribed. Writer called in rx for atarax per chart order per Ce Ce Penn NP.

## 2021-08-23 NOTE — ED Triage Notes (Signed)
Pt reports with pain all over. Pt states that he has a neurological condition that has been going on for a while.

## 2021-08-24 ENCOUNTER — Other Ambulatory Visit: Payer: Self-pay

## 2021-08-24 DIAGNOSIS — I10 Essential (primary) hypertension: Secondary | ICD-10-CM

## 2021-08-24 NOTE — ED Notes (Signed)
Pt states understanding of dc instructions, importance of follow up. Pt denies questions or concerns and declined transportation assistance upon dc. Pt ambulated w/ a steady gait w/o need for assistance. No belongings left in room upon dc. ? ?

## 2021-08-24 NOTE — Discharge Instructions (Signed)
A referral has been placed to neurology for you to make follow-up arrangements.  Continue other medications as previously prescribed.

## 2021-08-24 NOTE — ED Provider Notes (Signed)
[facility] DEPT Provider Note   CSN: [id number] Arrival date & time: 08/23/21  2254     History  Chief Complaint  Patient presents with   pain    [name] is a 66 y.o. male.  Patient is a 66 year old male with past medical history of bipolar disorder, hypertension, hyperlipidemia.  Patient presenting with complaints of "pain all over", hoarse voice, and feeling generally unwell.  Patient tells me he had been on Valium for 40+ years and this medication was stopped in 2020.  Since then he feels as though he is in a perpetual state of withdrawal.  He also believes that he has some sort of neurologic disorder stemming from years of Valium therapy.  Patient has been seen here on other occasions with similar complaints.  The history is provided by the patient.      Home Medications Prior to Admission medications   Medication Sig Start Date End Date Taking? Authorizing Provider  amitriptyline (ELAVIL) 10 MG tablet Take 1 tablet (10 mg total) by mouth at bedtime. 03/15/21   Salley Slaughter, NP  gabapentin (NEURONTIN) 300 MG capsule Take 1 capsule (300 mg total) by mouth 3 (three) times daily. 03/15/21   Salley Slaughter, NP  hydrOXYzine (ATARAX) 25 MG tablet Take 1 tablet (25 mg total) by mouth every 8 (eight) hours as needed. 08/23/21   Franne Grip, NP      Allergies    Morphine and related and Diazepam    Review of Systems   Review of Systems  All other systems reviewed and are negative.  Physical Exam Updated Vital Signs BP (!) 143/103 (BP Location: Left Arm)   Pulse 91   Temp 99.3 F (37.4 C) (Oral)   Resp 18   Ht '5\' 7"'$  (1.702 m)   Wt 63.5 kg   SpO2 94%   BMI 21.93 kg/m  Physical Exam Vitals and nursing note reviewed.  Constitutional:      General: He is not in acute distress.    Appearance: He is well-developed. He is not diaphoretic.  HENT:     Head: Normocephalic and atraumatic.  Cardiovascular:     Rate and Rhythm:  Normal rate and regular rhythm.     Heart sounds: No murmur heard.   No friction rub.  Pulmonary:     Effort: Pulmonary effort is normal. No respiratory distress.     Breath sounds: Normal breath sounds. No wheezing or rales.  Abdominal:     General: Bowel sounds are normal. There is no distension.     Palpations: Abdomen is soft.     Tenderness: There is no abdominal tenderness.  Musculoskeletal:        General: Normal range of motion.     Cervical back: Normal range of motion and neck supple.  Skin:    General: Skin is warm and dry.  Neurological:     Mental Status: He is alert and oriented to person, place, and time.     Coordination: Coordination normal.  Psychiatric:        Attention and Perception: Attention normal.        Mood and Affect: Affect is blunt.        Speech: Speech is rapid and pressured.        Behavior: Behavior is agitated.        Thought Content: Thought content does not include homicidal or suicidal ideation. Thought content does not include homicidal or suicidal plan.  ED Results / Procedures / Treatments   Labs (all labs ordered are listed, but only abnormal results are displayed) Labs Reviewed - No data to display  EKG None  Radiology No results found.  Procedures Procedures    Medications Ordered in ED Medications - No data to display  ED Course/ Medical Decision Making/ A&P  Patient presenting here with complaints as described in the HPI.  He believes he is suffering from a neurologic condition related to having been on Valium for many years.  Nothing today appears emergent.  This has been a 3-year long process and he has been seen on multiple occasions with similar complaints.  I see no indication for work-up and feel as though patient can be discharged.  He will be referred to neurology as an outpatient and can discuss his issues with them.  Final Clinical Impression(s) / ED Diagnoses Final diagnoses:  None    Rx / DC Orders ED  Discharge Orders     None         Veryl Speak, MD 08/24/21 [phone]

## 2021-08-27 ENCOUNTER — Telehealth: Payer: Self-pay | Admitting: *Deleted

## 2021-08-27 NOTE — Chronic Care Management (AMB) (Signed)
  Care Management   Outreach Note  08/27/2021 Name: [name] MRN: [id number] DOB: 1956/03/06  Referred by: Pcp, No Reason for referral : Advice Only (Confirm PCP)   Successful contact was made with patient to confirm Primary Care physician. Patient states that "I do not have a PCP and I do not want one because they will not believe him." Tried to give number for MD referral line but patient refused and disconnected the call.    [place] Management  Direct Dial: [phone]

## 2021-08-29 ENCOUNTER — Telehealth (INDEPENDENT_AMBULATORY_CARE_PROVIDER_SITE_OTHER): Payer: Medicare (Managed Care) | Admitting: Psychiatry

## 2021-08-29 ENCOUNTER — Encounter (HOSPITAL_COMMUNITY): Payer: Self-pay | Admitting: Psychiatry

## 2021-08-29 DIAGNOSIS — F411 Generalized anxiety disorder: Secondary | ICD-10-CM

## 2021-08-29 MED ORDER — HYDROXYZINE HCL 50 MG PO TABS: 50.0000 mg | ORAL_TABLET | Freq: Three times a day (TID) | ORAL | 3 refills | Status: DC

## 2021-08-29 MED ORDER — PROPRANOLOL HCL 10 MG PO TABS: 10.0000 mg | ORAL_TABLET | Freq: Three times a day (TID) | ORAL | 3 refills | Status: DC

## 2021-08-29 NOTE — Progress Notes (Signed)
[facility] MD/PA/NP OP Progress Note Virtual Visit via Telephone Note  I connected with [name] on 08/29/21 at 11:00 AM EDT by telephone and verified that I am speaking with the correct person using two identifiers.  Location: Patient: home Provider: Clinic   I discussed the limitations, risks, security and privacy concerns of performing an evaluation and management service by telephone and the availability of in person appointments. I also discussed with the patient that there may be a patient responsible charge related to this service. The patient expressed understanding and agreed to proceed.   I provided 30 minutes of non-face-to-face time during this encounter.   08/29/2021 12:17 PM [name]  MRN:  [id number]  Chief Complaint: "This medication messed me up"  HPI: 66 year old male seen today for follow up psychiatric evaluation.   He has a psychiatric history of Bipolar disorder, depression, anxiety, and panic disorder.  He is currently taking hydroxyzine 25 mg every 8 hours as needed.  He informed Probation officer that he finds hydroxyzine ineffective.  Today patient was unable to login virtually so his assessment was done over the phone. During exam patient is hyperverbal and coughed throughout the exam.  Patient preoccupied about being in withdrawal from benzos which he has not taken in over a year.  Provider informed patient that it is not likely that he continues to be in withdrawal however he insists that he is and notes that he believes that the Lasana would be effective in helping him with his withdrawal.  Patient notes that he would also like to try Flumazenil.  Provider informed patient that this medication cannot be prescribed by writer.  He endorsed understanding.  Patient notes that he fears taking medication as he has found gabapentin, Cymbalta, Seroquel, mirtazapine, Elavil, and BuSpar in the past.   Patient reports that he continues to be in pain most days.  He notes that he  has pain in his throat.  Provider asked patient if he had follow-up with his primary care doctor and he notes that he was too embarrassed to.  Provider encouraged patient to seek treatment from primary care.   He has tried propanolol in the past and was agreeable to retrying it today.  Patient will start propanolol 10 mg 3 times daily to help manage anxiety.  Hydroxyzine increased from 25 mg to 50 mg 3 times daily as needed at this time he does not want to try counseling or any other medications.  No other concerns noted at this time. Visit Diagnosis:    ICD-10-CM   1. Generalized anxiety disorder  F41.1 hydrOXYzine (ATARAX) 50 MG tablet    propranolol (INDERAL) 10 MG tablet       Past Psychiatric History:  Bipolar disorder, depression, anxiety, and panic disorder  Past Medical History:  Past Medical History:  Diagnosis Date   Alcohol abuse    Allergy    Bipolar disorder ([place])    Chronic pain    lower back pain, "that has progressed"   Depression    Diverticulosis    Dyspnea    Hepatitis C    hx. of"states he no longer has"" is clear".   History of recreational drug use    "cocaine, marijuana, polysubstance" quit 1987.   HLD (hyperlipidemia)    Hypertension    Pancreatitis 2015   Panic attack    Sleep apnea    no cpap used,still has machine(use rarely)    Past Surgical History:  Procedure Laterality Date   CARDIAC CATHETERIZATION  2003   No CAD   EUS N/A 06/23/2014   Procedure: UPPER ENDOSCOPIC ULTRASOUND (EUS) LINEAR;  Surgeon: Milus Banister, MD;  Location: [facility] ENDOS[facility];  Service: Endoscopy;  Laterality: N/A;   sinus surgery     TONSILLECTOMY      Family Psychiatric History: Unknown  Family History:  Family History  Problem Relation Age of Onset   Heart disease Mother    Kidney disease Mother    COPD Mother    Lupus Mother    Heart disease Father    Breast cancer Maternal Grandmother    Diabetes Maternal Grandfather     Social History:  Social History    Socioeconomic History   Marital status: Divorced    Spouse name: Not on file   Number of children: 0   Years of education: GED   Highest education level: Not on file  Occupational History   Occupation: DISABLED   Occupation:    Tobacco Use   Smoking status: Former    Packs/day: 2.00    Years: 16.00    Pack years: 32.00    Types: Cigarettes    Quit date: 04/08/2002    Years since quitting: 19.4   Smokeless tobacco: Never  Vaping Use   Vaping Use: Never used  Substance and Sexual Activity   Alcohol use: No    Alcohol/week: 0.0 standard drinks    Comment: Quit in 1987   Drug use: Yes    Types: Marijuana   Sexual activity: Not Currently    Partners: Female  Other Topics Concern   Not on file  Social History Narrative   Patient lives at home alone.    Disabled.   Education GED    Both handed.   Caffeine two cups of coffee daily.      Social Determinants of Health   Financial Resource Strain: Not on file  Food Insecurity: Not on file  Transportation Needs: Not on file  Physical Activity: Not on file  Stress: Not on file  Social Connections: Not on file    Allergies:  Allergies  Allergen Reactions   Morphine And Related Shortness Of Breath   Diazepam Other (See Comments)    Ringing in ears, diarrhea, CNS issues-multiple reactions     Metabolic Disorder Labs: Lab Results  Component Value Date   HGBA1C 5.5 03/24/2020   MPG 111.15 03/24/2020   MPG 111 09/15/2012   No results found for: PROLACTIN Lab Results  Component Value Date   CHOL 170 03/24/2020   TRIG 44 03/24/2020   HDL 62 03/24/2020   CHOLHDL 2.7 03/24/2020   VLDL 9 03/24/2020   LDLCALC 99 03/24/2020   LDLCALC 74 08/16/2019   Lab Results  Component Value Date   TSH 2.266 03/24/2020   TSH 3.362 12/14/2019    Therapeutic Level Labs: No results found for: LITHIUM No results found for: VALPROATE No components found for:  CBMZ  Current Medications: Current Outpatient Medications   Medication Sig Dispense Refill   propranolol (INDERAL) 10 MG tablet Take 1 tablet (10 mg total) by mouth 3 (three) times daily. 90 tablet 3   hydrOXYzine (ATARAX) 50 MG tablet Take 1 tablet (50 mg total) by mouth 3 (three) times daily. 90 tablet 3   No current facility-administered medications for this visit.     Musculoskeletal: Strength & Muscle Tone:  Unable to assess due to telephone visit Gait & Station:  Unable to assess due to telephone visit Patient leans: N/A  Psychiatric Specialty Exam:  Review of Systems  There were no vitals taken for this visit.There is no height or weight on file to calculate BMI.  General Appearance:  Unable to assess due to telephone visit  Eye Contact:   Unable to assess due to telephone visit  Speech:  Clear and Coherent and Pressured  Volume:  Increased  Mood:  Anxious and Depressed  Affect:   Unable to assess due to telephone visit  Thought Process:  Coherent, Goal Directed and Linear  Orientation:  Full (Time, Place, and Person)  Thought Content: WDL and Logical   Suicidal Thoughts:  No  Homicidal Thoughts:  No  Memory:  Immediate;   Good Recent;   Good Remote;   Good  Judgement:  Fair  Insight:  Fair  Psychomotor Activity:   Unable to assess due to telephone visit.   Concentration:  Concentration: Good and Attention Span: Good  Recall:  Good  Fund of Knowledge: Good  Language: Good  Akathisia:   Unable to assess due to telephone visit  Handed:  Right  AIMS (if indicated): Not done  Assets:  Communication Skills Desire for Improvement Housing Leisure Time Social Support  ADL's:  Intact  Cognition: WNL  Sleep:  Fair   Screenings: GAD-7    Flowsheet Row Video Visit from 07/12/2020 in [facility] from 04/19/2020 in Primary Care at [facility]  Total GAD-7 Score 19 [place] Visit from 02/28/2014 in [facility]  Total Score (max  30 points ) 29      PHQ2-9    Flowsheet Row Counselor from 07/20/2020 in [facility] Video Visit from 07/12/2020 in [facility] from 04/19/2020 in Primary Care at [facility] ED from 03/24/2020 in [facility] Office Visit from 09/15/2019 in [facility]  PHQ-2 Total Score '6 4 3 6 6  '$ PHQ-9 Total Score '24 16 12 24 23      '$ Flowsheet Row ED from 08/23/2021 in [facility] DEPT ED from 06/24/2021 in [place] [facility] DEPT ED from 06/19/2021 in [place] DEPT  C-SSRS RISK CATEGORY No Risk No Risk No Risk        Assessment and Plan: Patient endorses symptoms of anxiety and depression.  Patient will start propanolol 10 mg 3 times daily to help manage anxiety.  Hydroxyzine increased from 25 mg to 50 mg 3 times daily as needed at this time he does not want to try counseling or any other medications.   1. Generalized anxiety disorder  Increased- hydrOXYzine (ATARAX) 50 MG tablet; Take 1 tablet (50 mg total) by mouth 3 (three) times daily.  Dispense: 90 tablet; Refill: 3 Start- propranolol (INDERAL) 10 MG tablet; Take 1 tablet (10 mg total) by mouth 3 (three) times daily.  Dispense: 90 tablet; Refill: 3     Follow up as needed  Salley Slaughter, NP 08/29/2021, 12:17 PM

## 2021-10-04 ENCOUNTER — Telehealth (HOSPITAL_COMMUNITY): Payer: Self-pay | Admitting: *Deleted

## 2021-10-04 NOTE — Telephone Encounter (Signed)
Patient called started having a severe coughing attack  & in between coughs he asked if  Dr Ronne Binning could call him.

## 2021-10-05 NOTE — Telephone Encounter (Signed)
Provider attempted to call patient twice without success.  Provider left message informing patient of walk-in hours.  Provider also informed patient that he could call again if needed.

## 2021-10-17 ENCOUNTER — Telehealth (INDEPENDENT_AMBULATORY_CARE_PROVIDER_SITE_OTHER): Payer: Medicare (Managed Care) | Admitting: Psychiatry

## 2021-10-17 ENCOUNTER — Encounter (HOSPITAL_COMMUNITY): Payer: Self-pay | Admitting: Psychiatry

## 2021-10-17 DIAGNOSIS — F22 Delusional disorders: Secondary | ICD-10-CM

## 2021-10-17 DIAGNOSIS — F411 Generalized anxiety disorder: Secondary | ICD-10-CM | POA: Diagnosis not present

## 2021-10-17 DIAGNOSIS — F331 Major depressive disorder, recurrent, moderate: Secondary | ICD-10-CM | POA: Diagnosis not present

## 2021-10-17 MED ORDER — PROPRANOLOL HCL 10 MG PO TABS: 10.0000 mg | ORAL_TABLET | Freq: Three times a day (TID) | ORAL | 3 refills | Status: DC

## 2021-10-17 MED ORDER — RISPERIDONE 0.5 MG PO TABS: 0.5000 mg | ORAL_TABLET | Freq: Every day | ORAL | 3 refills | Status: DC

## 2021-10-17 MED ORDER — HYDROXYZINE HCL 50 MG PO TABS: 50.0000 mg | ORAL_TABLET | Freq: Three times a day (TID) | ORAL | 3 refills | Status: DC

## 2021-10-17 NOTE — Progress Notes (Signed)
[place] MD/PA/NP OP Progress Note Virtual Visit via Telephone Note  I connected with [name] on 10/17/21 at 11:00 AM EDT by telephone and verified that I am speaking with the correct person using two identifiers.  Location: Patient: home Provider: Clinic   I discussed the limitations, risks, security and privacy concerns of performing an evaluation and management service by telephone and the availability of in person appointments. I also discussed with the patient that there may be a patient responsible charge related to this service. The patient expressed understanding and agreed to proceed.   I provided 30 minutes of non-face-to-face time during this encounter.   10/17/2021 11:17 AM [name]  MRN:  [id number]  Chief Complaint: "The cough gets better and worse"  HPI: 66 year old male seen today for follow up psychiatric evaluation.   He has a psychiatric history of Bipolar disorder, depression, anxiety, and panic disorder.  He is currently taking hydroxyzine 50 mg every 8 hours as needed and propranolol 10 mg three times daily.  He informed Probation officer that his medications are somewhat effective in managing his psychiatric conditions.  Today patient was unable to login virtually so his assessment was done over the phone. During exam patient coughs excessive cough. He notes that he has been more irritable and paranoid. He reports that his withdrawal from benzos and his coughing is causing his paranoia and irritability. He also notes that recently he has been having headaches. He reports that he went to his primary care doctor however she informed him that she cannot help him.  He notes that he is distrustful of providers.  He also reports that he has attempted to go to the emergency room but has been kicked out on several occasions.  Patient quantifies his pain as a 10 out of 10.  He notes that he missed his appointment with pain management.  Provider encouraged patient to follow-up with PCP.   Provider recommended having labs drawn however patient notes that if they were ordered he would not have them drawn.  Patient notes that he has been more depressed and has only been sleeping 3 hours nightly.  He notes that he receives meals from [employer] and reports having adequate appetite.  He denies weight gain or weight loss.    Patient reports that he finds propanolol and hydroxyzine somewhat effective.  Provider recommended starting Risperdal 0.5 mg nightly to help manage paranoia, sleep, and depression.  Patient informed Probation officer that he would look at benzo Buddy before committing to taking the medication.  Provider encouraged patient not to rely on websites or [employer].  He endorsed understanding however disagreed.  Risperdal 0.5 mg nightly started.  Potential side effects of medication and risks vs benefits of treatment vs non-treatment were explained and discussed. All questions were answered.Patient will continue all other medications as prescribed.  No other concerns at this time.  Visit Diagnosis:    ICD-10-CM   1. Moderate episode of recurrent major depressive disorder (HCC)  F33.1 risperiDONE (RISPERDAL) 0.5 MG tablet    2. Generalized anxiety disorder  F41.1 hydrOXYzine (ATARAX) 50 MG tablet    propranolol (INDERAL) 10 MG tablet    risperiDONE (RISPERDAL) 0.5 MG tablet    3. Paranoia ([place])  F22 risperiDONE (RISPERDAL) 0.5 MG tablet       Past Psychiatric History:  Bipolar disorder, depression, anxiety, and panic disorder  Past Medical History:  Past Medical History:  Diagnosis Date   Alcohol abuse    Allergy  Bipolar disorder ([place])    Chronic pain    lower back pain, "that has progressed"   Depression    Diverticulosis    Dyspnea    Hepatitis C    hx. of"states he no longer has"" is clear".   History of recreational drug use    "cocaine, marijuana, polysubstance" quit 1987.   HLD (hyperlipidemia)    Hypertension    Pancreatitis 2015   Panic attack    Sleep  apnea    no cpap used,still has machine(use rarely)    Past Surgical History:  Procedure Laterality Date   CARDIAC CATHETERIZATION  2003   No CAD   EUS N/A 06/23/2014   Procedure: UPPER ENDOSCOPIC ULTRASOUND (EUS) LINEAR;  Surgeon: Milus Banister, MD;  Location: [facility];  Service: Endoscopy;  Laterality: N/A;   sinus surgery     TONSILLECTOMY      Family Psychiatric History: Unknown  Family History:  Family History  Problem Relation Age of Onset   Heart disease Mother    Kidney disease Mother    COPD Mother    Lupus Mother    Heart disease Father    Breast cancer Maternal Grandmother    Diabetes Maternal Grandfather     Social History:  Social History   Socioeconomic History   Marital status: Divorced    Spouse name: Not on file   Number of children: 0   Years of education: GED   Highest education level: Not on file  Occupational History   Occupation: DISABLED   Occupation:    Tobacco Use   Smoking status: Former    Packs/day: 2.00    Years: 16.00    Total pack years: 32.00    Types: Cigarettes    Quit date: 04/08/2002    Years since quitting: 19.5   Smokeless tobacco: Never  Vaping Use   Vaping Use: Never used  Substance and Sexual Activity   Alcohol use: No    Alcohol/week: 0.0 standard drinks of alcohol    Comment: Quit in 1987   Drug use: Yes    Types: Marijuana   Sexual activity: Not Currently    Partners: Female  Other Topics Concern   Not on file  Social History Narrative   Patient lives at home alone.    Disabled.   Education GED    Both handed.   Caffeine two cups of coffee daily.      Social Determinants of Health   Financial Resource Strain: Not on file  Food Insecurity: Not on file  Transportation Needs: Not on file  Physical Activity: Not on file  Stress: Not on file  Social Connections: Not on file    Allergies:  Allergies  Allergen Reactions   Morphine And Related Shortness Of Breath   Diazepam Other (See Comments)     Ringing in ears, diarrhea, CNS issues-multiple reactions     Metabolic Disorder Labs: Lab Results  Component Value Date   HGBA1C 5.5 03/24/2020   MPG 111.15 03/24/2020   MPG 111 09/15/2012   No results found for: "PROLACTIN" Lab Results  Component Value Date   CHOL 170 03/24/2020   TRIG 44 03/24/2020   HDL 62 03/24/2020   CHOLHDL 2.7 03/24/2020   VLDL 9 03/24/2020   LDLCALC 99 03/24/2020   LDLCALC 74 08/16/2019   Lab Results  Component Value Date   TSH 2.266 03/24/2020   TSH 3.362 12/14/2019    Therapeutic Level Labs: No results found for: "LITHIUM" No results  found for: "VALPROATE" No results found for: "CBMZ"  Current Medications: Current Outpatient Medications  Medication Sig Dispense Refill   risperiDONE (RISPERDAL) 0.5 MG tablet Take 1 tablet (0.5 mg total) by mouth at bedtime. 30 tablet 3   hydrOXYzine (ATARAX) 50 MG tablet Take 1 tablet (50 mg total) by mouth 3 (three) times daily. 90 tablet 3   propranolol (INDERAL) 10 MG tablet Take 1 tablet (10 mg total) by mouth 3 (three) times daily. 90 tablet 3   No current facility-administered medications for this visit.     Musculoskeletal: Strength & Muscle Tone:  Unable to assess due to telephone visit Gait & Station:  Unable to assess due to telephone visit Patient leans: N/A  Psychiatric Specialty Exam: Review of Systems  There were no vitals taken for this visit.There is no height or weight on file to calculate BMI.  General Appearance:  Unable to assess due to telephone visit  Eye Contact:   Unable to assess due to telephone visit  Speech:  Clear and Coherent and Pressured  Volume:  Increased  Mood:  Anxious, Depressed, and Irritable  Affect:   Unable to assess due to telephone visit  Thought Process:  Coherent, Goal Directed and Linear  Orientation:  Full (Time, Place, and Person)  Thought Content: Logical, Paranoid Ideation, and Rumination   Suicidal Thoughts:  No  Homicidal Thoughts:  No  Memory:   Immediate;   Good Recent;   Good Remote;   Good  Judgement:  Fair  Insight:  Fair  Psychomotor Activity:   Unable to assess due to telephone visit.   Concentration:  Concentration: Good and Attention Span: Good  Recall:  Good  Fund of Knowledge: Good  Language: Good  Akathisia:   Unable to assess due to telephone visit  Handed:  Right  AIMS (if indicated): Not done  Assets:  Communication Skills Desire for Improvement Housing Leisure Time Social Support  ADL's:  Intact  Cognition: WNL  Sleep:  Poor   Screenings: GAD-7    Flowsheet Row Video Visit from 07/12/2020 in [place] from 04/19/2020 in Primary Care at [facility]  Total GAD-7 Score 19 [place] Visit from 02/28/2014 in [facility]  Total Score (max 30 points ) 29      PHQ2-9    Flowsheet Row Counselor from 07/20/2020 in [facility] Video Visit from 07/12/2020 in [place] from 04/19/2020 in Primary Care at [facility] ED from 03/24/2020 in [facility] Office Visit from 09/15/2019 in [place]  PHQ-2 Total Score '6 4 3 6 6  '$ PHQ-9 Total Score '24 16 12 24 23      '$ Flowsheet Row ED from 08/23/2021 in [place] [facility] ED from 06/24/2021 in [place] DEPT ED from 06/19/2021 in [place] DEPT  C-SSRS RISK CATEGORY No Risk No Risk No Risk        Assessment and Plan: Patient endorses symptoms of insomnia, paranoia, anxiety and depression.  Patient reports that he finds propanolol and hydroxyzine somewhat effective.  Provider recommended starting Risperdal 0.5 mg nightly to help manage paranoia, sleep, and depression.  Patient informed Probation officer that he would look at benzo Buddy before committing to taking the  medication.  Provider encouraged patient not to rely on websites or [employer].  He endorsed understanding  however disagreed.  Risperdal 0.5 mg nightly started.  Patient will continue all other medications as prescribed.   1. Generalized anxiety disorder  Continue- hydrOXYzine (ATARAX) 50 MG tablet; Take 1 tablet (50 mg total) by mouth 3 (three) times daily.  Dispense: 90 tablet; Refill: 3 Continue- propranolol (INDERAL) 10 MG tablet; Take 1 tablet (10 mg total) by mouth 3 (three) times daily.  Dispense: 90 tablet; Refill: 3 Start- risperiDONE (RISPERDAL) 0.5 MG tablet; Take 1 tablet (0.5 mg total) by mouth at bedtime.  Dispense: 30 tablet; Refill: 3  2. Moderate episode of recurrent major depressive disorder (HCC)  Start- risperiDONE (RISPERDAL) 0.5 MG tablet; Take 1 tablet (0.5 mg total) by mouth at bedtime.  Dispense: 30 tablet; Refill: 3  3. Paranoia ([place])  Start- risperiDONE (RISPERDAL) 0.5 MG tablet; Take 1 tablet (0.5 mg total) by mouth at bedtime.  Dispense: 30 tablet; Refill: 3   Follow up in 3 months  Salley Slaughter, NP 10/17/2021, 11:17 AM

## 2021-10-29 ENCOUNTER — Other Ambulatory Visit (HOSPITAL_COMMUNITY): Payer: Self-pay | Admitting: Psychiatry

## 2021-11-03 ENCOUNTER — Other Ambulatory Visit (HOSPITAL_COMMUNITY): Payer: Self-pay | Admitting: Psychiatry

## 2021-11-08 NOTE — Telephone Encounter (Signed)
Received request for refill of patient's Elavil.  However, this medication had been stopped by patient's provider and so refill is not appropriate.    Fatima Sanger MD Resident

## 2021-11-19 ENCOUNTER — Telehealth (HOSPITAL_COMMUNITY): Payer: Self-pay | Admitting: *Deleted

## 2021-11-19 NOTE — Telephone Encounter (Signed)
Patient called LVM requested Amitriptyline 20 mg Stated once took 10 mg but requested stronger dosage. Excessive Cough makes it  Difficult to hear & understand clearly

## 2021-11-20 ENCOUNTER — Other Ambulatory Visit (HOSPITAL_COMMUNITY): Payer: Self-pay | Admitting: Psychiatry

## 2021-11-20 MED ORDER — AMITRIPTYLINE HCL 10 MG PO TABS: 20.0000 mg | ORAL_TABLET | Freq: Every day | ORAL | 2 refills | Status: DC

## 2021-11-20 NOTE — Telephone Encounter (Signed)
Medication filled and sent to preferred pharmacy

## 2021-11-27 ENCOUNTER — Telehealth (HOSPITAL_COMMUNITY): Payer: Self-pay | Admitting: *Deleted

## 2021-11-27 NOTE — Telephone Encounter (Signed)
PATIENT CALLED HAVING EXTREME COUGH SPELL IN BETWEEN-- I HEARD HIM SAY HE WAS IN THE HOSPITAL  ANOTHER SERIOUS  COUGHING ATTACK -- DON'T LET THEM KILL ME-- TELL DR PARSONS THE MEDICATION WILL WORK-- COUGHING ATTACK-- HAVE HER CALL ME.

## 2021-11-27 NOTE — Telephone Encounter (Signed)
Patient reports that a few weeks ago he was feeling better.  He now notes that he has been experiencing an increased pain.  He reports fearing to go to the hospital as they will kick him out.  He informed Probation officer that he will attempt to go to another hospital to help manage his pain.  He reports that amitriptyline is somewhat effective however reports that at times he does not take it as prescribed.  Provider encouraged patient to take medications as prescribed.  He endorsed understanding and agreed.  No other concerns at this time.

## 2021-11-28 ENCOUNTER — Emergency Department (HOSPITAL_BASED_OUTPATIENT_CLINIC_OR_DEPARTMENT_OTHER)
Admission: EM | Admit: 2021-11-28 | Discharge: 2021-11-29 | Disposition: A | Discharge status: Home / Self Care | Payer: Medicare Other | Attending: Emergency Medicine | Admitting: Emergency Medicine

## 2021-11-28 ENCOUNTER — Encounter (HOSPITAL_BASED_OUTPATIENT_CLINIC_OR_DEPARTMENT_OTHER): Payer: Self-pay

## 2021-11-28 ENCOUNTER — Other Ambulatory Visit: Payer: Self-pay

## 2021-11-28 DIAGNOSIS — R45851 Suicidal ideations: Secondary | ICD-10-CM | POA: Insufficient documentation

## 2021-11-28 DIAGNOSIS — M791 Myalgia, unspecified site: Secondary | ICD-10-CM | POA: Diagnosis not present

## 2021-11-28 DIAGNOSIS — R000 Tachycardia, unspecified: Secondary | ICD-10-CM | POA: Insufficient documentation

## 2021-11-28 DIAGNOSIS — F419 Anxiety disorder, unspecified: Secondary | ICD-10-CM | POA: Diagnosis not present

## 2021-11-28 DIAGNOSIS — F1311 Sedative, hypnotic or anxiolytic abuse, in remission: Secondary | ICD-10-CM | POA: Insufficient documentation

## 2021-11-28 DIAGNOSIS — Z046 Encounter for general psychiatric examination, requested by authority: Secondary | ICD-10-CM | POA: Diagnosis present

## 2021-11-28 DIAGNOSIS — F1910 Other psychoactive substance abuse, uncomplicated: Secondary | ICD-10-CM | POA: Insufficient documentation

## 2021-11-28 DIAGNOSIS — F319 Bipolar disorder, unspecified: Secondary | ICD-10-CM | POA: Insufficient documentation

## 2021-11-28 DIAGNOSIS — Z20822 Contact with and (suspected) exposure to covid-19: Secondary | ICD-10-CM | POA: Insufficient documentation

## 2021-11-28 DIAGNOSIS — G8929 Other chronic pain: Secondary | ICD-10-CM | POA: Diagnosis not present

## 2021-11-28 DIAGNOSIS — Z79899 Other long term (current) drug therapy: Secondary | ICD-10-CM | POA: Insufficient documentation

## 2021-11-28 LAB — URINALYSIS, ROUTINE W REFLEX MICROSCOPIC
Bilirubin Urine: NEGATIVE
Glucose, UA: NEGATIVE mg/dL
Ketones, ur: NEGATIVE mg/dL
Leukocytes,Ua: NEGATIVE
Nitrite: NEGATIVE
Specific Gravity, Urine: 1.017 (ref 1.005–1.030)
pH: 6 (ref 5.0–8.0)

## 2021-11-28 LAB — CBC WITH DIFFERENTIAL/PLATELET
Abs Immature Granulocytes: 0.03 10*3/uL (ref 0.00–0.07)
Basophils Absolute: 0 10*3/uL (ref 0.0–0.1)
Basophils Relative: 0 %
Eosinophils Absolute: 0 10*3/uL (ref 0.0–0.5)
Eosinophils Relative: 0 %
HCT: 42.8 % (ref 39.0–52.0)
Hemoglobin: 14.5 g/dL (ref 13.0–17.0)
Immature Granulocytes: 0 %
Lymphocytes Relative: 21 %
Lymphs Abs: 2 10*3/uL (ref 0.7–4.0)
MCH: 26.4 pg (ref 26.0–34.0)
MCHC: 33.9 g/dL (ref 30.0–36.0)
MCV: 77.8 fL — ABNORMAL LOW (ref 80.0–100.0)
Monocytes Absolute: 1 10*3/uL (ref 0.1–1.0)
Monocytes Relative: 10 %
Neutro Abs: 6.6 10*3/uL (ref 1.7–7.7)
Neutrophils Relative %: 69 %
Platelets: 268 10*3/uL (ref 150–400)
RBC: 5.5 MIL/uL (ref 4.22–5.81)
RDW: 13.6 % (ref 11.5–15.5)
WBC: 9.6 10*3/uL (ref 4.0–10.5)
nRBC: 0 % (ref 0.0–0.2)

## 2021-11-28 LAB — BASIC METABOLIC PANEL
Anion gap: 10 (ref 5–15)
BUN: 18 mg/dL (ref 8–23)
CO2: 24 mmol/L (ref 22–32)
Calcium: 9.5 mg/dL (ref 8.9–10.3)
Chloride: 104 mmol/L (ref 98–111)
Creatinine, Ser: 1.15 mg/dL (ref 0.61–1.24)
GFR, Estimated: >60 mL/min (ref >60)
Glucose, Bld: 129 mg/dL — ABNORMAL HIGH (ref 70–99)
Potassium: 4.2 mmol/L (ref 3.5–5.1)
Sodium: 138 mmol/L (ref 135–145)

## 2021-11-28 LAB — HEPATIC FUNCTION PANEL
ALT: 10 U/L (ref 0–44)
AST: 16 U/L (ref 15–41)
Albumin: 4.6 g/dL (ref 3.5–5.0)
Alkaline Phosphatase: 67 U/L (ref 38–126)
Bilirubin, Direct: <0.1 mg/dL (ref 0.0–0.2)
Total Bilirubin: 0.5 mg/dL (ref 0.3–1.2)
Total Protein: 7.3 g/dL (ref 6.5–8.1)

## 2021-11-28 LAB — ACETAMINOPHEN LEVEL: Acetaminophen (Tylenol), Serum: <10 ug/mL — ABNORMAL LOW (ref 10–30)

## 2021-11-28 LAB — RAPID URINE DRUG SCREEN, HOSP PERFORMED
Amphetamines: NOT DETECTED
Barbiturates: NOT DETECTED
Benzodiazepines: NOT DETECTED
Cocaine: NOT DETECTED
Opiates: NOT DETECTED
Tetrahydrocannabinol: NOT DETECTED

## 2021-11-28 LAB — D-DIMER, QUANTITATIVE: D-Dimer, Quant: 0.29 ug/mL-FEU (ref 0.00–0.50)

## 2021-11-28 LAB — TROPONIN I (HIGH SENSITIVITY): Troponin I (High Sensitivity): 5 ng/L (ref <18)

## 2021-11-28 LAB — RESP PANEL BY RT-PCR (FLU A&B, COVID) ARPGX2
Influenza A by PCR: NEGATIVE
Influenza B by PCR: NEGATIVE
SARS Coronavirus 2 by RT PCR: NEGATIVE

## 2021-11-28 LAB — SALICYLATE LEVEL: Salicylate Lvl: <7 mg/dL — ABNORMAL LOW (ref 7.0–30.0)

## 2021-11-28 LAB — TSH: TSH: 2.434 u[IU]/mL (ref 0.350–4.500)

## 2021-11-28 LAB — CBG MONITORING, ED: Glucose-Capillary: 134 mg/dL — ABNORMAL HIGH (ref 70–99)

## 2021-11-28 MED ORDER — LACTATED RINGERS IV BOLUS
1000.0000 mL | Freq: Once | INTRAVENOUS | Status: AC
  Administered 2021-11-28: 1000 mL via INTRAVENOUS

## 2021-11-28 MED ORDER — PROPRANOLOL HCL 10 MG PO TABS: 10.0000 mg | ORAL_TABLET | Freq: Three times a day (TID) | ORAL | Status: DC

## 2021-11-28 MED ORDER — KETOROLAC TROMETHAMINE 30 MG/ML IJ SOLN
INTRAMUSCULAR | Status: AC
  Filled 2021-11-28: qty 1

## 2021-11-28 MED ORDER — KETOROLAC TROMETHAMINE 30 MG/ML IJ SOLN: 30.0000 mg | Freq: Once | INTRAMUSCULAR | Status: AC

## 2021-11-28 MED ORDER — HYDROXYZINE HCL 25 MG PO TABS
50.0000 mg | ORAL_TABLET | Freq: Once | ORAL | Status: AC
  Administered 2021-11-28: 50 mg via ORAL
  Filled 2021-11-28: qty 2

## 2021-11-28 NOTE — Discharge Instructions (Addendum)
Recommend you stop using Kratom as this supplement can be dangerous. Your laboratory workup was reassuring. Your symptoms improved with IV fluids and Atarax. You could benefit from outpatient substance abuse treatment.

## 2021-11-28 NOTE — ED Notes (Signed)
Bed adjusted for pt. Denies other needs.

## 2021-11-28 NOTE — ED Provider Notes (Addendum)
[facility] EMERGENCY DEPT Provider Note   CSN: [id number] Arrival date & time: 11/28/21  1425     History  Chief Complaint  Patient presents with   Pain    [name] is a 66 y.o. male.  HPI   66 year old male with a medical history of bipolar disorder, depression, anxiety, panic disorder who presents to the emergency department with a chief complaint of diffuse pain all over and anxiousness.  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  He endorses persistent symptoms of agitation and whole body pain.  He has had a chronic tic-like cough for the past 3 years.  He endorses passive thoughts of harming himself in the setting of his symptoms.  He denies any active thoughts of suicidal ideation, homicidal ideation, audiovisual hallucinations.  He describes his whole body pain as all over from head to toe.  He endorses restlessness.  On chart review, he has been seen multiple times in the emergency department for similar complaints.  He follows outpatient with psychiatry and has previously been on Atarax and propranolol.  He states that he has hesitancy in taking any medications and currently only endorses taking amitriptyline.  He states that he has been self-medicating with kratom and took a large amount this morning. He states that he took the supplement by mouth. He denies any other supplement or stimulant use.   Home Medications Prior to Admission medications   Medication Sig Start Date End Date Taking? Authorizing Provider  amitriptyline (ELAVIL) 10 MG tablet Take 2 tablets (20 mg total) by mouth at bedtime. 11/20/21   Salley Slaughter, NP  hydrOXYzine (ATARAX) 50 MG tablet Take 1 tablet (50 mg total) by mouth 3 (three) times daily. 10/17/21   Salley Slaughter, NP  propranolol (INDERAL) 10 MG tablet Take 1 tablet (10 mg total) by mouth 3 (three) times daily. 10/17/21   Salley Slaughter, NP  risperiDONE (RISPERDAL) 0.5 MG tablet Take 1 tablet (0.5 mg total) by mouth at bedtime. 10/17/21   Salley Slaughter, NP      Allergies    Morphine and related and Diazepam    Review of Systems   Review of Systems  Psychiatric/Behavioral:  Positive for agitation. The patient is nervous/anxious and is hyperactive.   All other systems reviewed and are negative.   Physical Exam Updated Vital Signs BP (!) 152/104   Pulse 97   Temp 98.1 F (36.7 C) (Oral)   Resp (!) 24   Ht '5\' 7"'$  (1.702 m)   Wt 63.5 kg   SpO2 96%   BMI 21.93 kg/m  Physical Exam Vitals and nursing note reviewed.  Constitutional:      Appearance: He is not ill-appearing.     Comments: Uncomfortable appearing, anxious, hyperactive.  HENT:     Head: Normocephalic and atraumatic.  Eyes:     Conjunctiva/sclera: Conjunctivae normal.     Pupils: Pupils are equal, round, and reactive to light.  Cardiovascular:     Rate and Rhythm: Regular rhythm. Tachycardia present.  Pulmonary:     Effort: Pulmonary effort is normal. No respiratory distress.  Abdominal:     General: There is no distension.     Tenderness: There is no guarding.  Musculoskeletal:        General: No deformity or signs of injury.     Cervical back: Neck supple.  Skin:    Findings:  No lesion or rash.  Neurological:     General: No focal deficit present.     Mental Status: He is alert. Mental status is at baseline.     Comments: MENTAL STATUS EXAM:    Orientation: Alert and oriented to person, place and time.  Memory: Cooperative, follows commands well.  Language: Speech is pressured. Language and speech is clear.  CRANIAL NERVES:    CN 2 (Optic): Visual fields intact to confrontation. No visual field deficit. CN 3,4,6 (EOM): Pupils equal and reactive to light. Full extraocular eye movement without nystagmus.  CN 5 (Trigeminal): Facial sensation is normal, no weakness of masticatory muscles.  CN 7 (Facial): No facial weakness  or asymmetry.  CN 8 (Auditory): Auditory acuity grossly normal.  CN 9,10 (Glossophar): The uvula is midline, the palate elevates symmetrically.  CN 11 (spinal access): Normal sternocleidomastoid and trapezius strength.  CN 12 (Hypoglossal): The tongue is midline. No atrophy or fasciculations.Marland Kitchen   MOTOR:  Muscle Strength: 5/5RUE, 5/5LUE, 5/5RLE, 5/5LLE  REFLEXES: No clonus.   COORDINATION:  Diffusely tremulous  SENSATION:   Intact to light touch all four extremities.    Psychiatric:        Attention and Perception: Attention and perception normal.        Mood and Affect: Mood is anxious. Affect is blunt.        Speech: Speech is rapid and pressured.        Behavior: Behavior is hyperactive. Behavior is not withdrawn or combative. Behavior is cooperative.        Thought Content: Thought content is not paranoid or delusional. Thought content includes suicidal ideation. Thought content does not include homicidal ideation. Thought content does not include suicidal plan.     Comments: Pt endorses passive SI in the setting of his symptoms, denies any plan for self harm. Tic-like cough present in between the patient's pressured speech.     ED Results / Procedures / Treatments   Labs (all labs ordered are listed, but only abnormal results are displayed) Labs Reviewed  CBC WITH DIFFERENTIAL/PLATELET - Abnormal; Notable for the following components:      Result Value   MCV 77.8 (*)    All other components within normal limits  BASIC METABOLIC PANEL - Abnormal; Notable for the following components:   Glucose, Bld 129 (*)    All other components within normal limits  URINALYSIS, ROUTINE W REFLEX MICROSCOPIC - Abnormal; Notable for the following components:   Hgb urine dipstick TRACE (*)    Protein, ur TRACE (*)    All other components within normal limits  SALICYLATE LEVEL - Abnormal; Notable for the following components:   Salicylate Lvl <4.1 (*)    All other components within normal  limits  CBG MONITORING, ED - Abnormal; Notable for the following components:   Glucose-Capillary 134 (*)    All other components within normal limits  RESP PANEL BY RT-PCR (FLU A&B, COVID) ARPGX2  TSH  RAPID URINE DRUG SCREEN, HOSP PERFORMED  HEPATIC FUNCTION PANEL  D-DIMER, QUANTITATIVE  ACETAMINOPHEN LEVEL  TROPONIN I (HIGH SENSITIVITY)    EKG EKG Interpretation  Date/Time:  Wednesday November 28 2021 16:14:42 EDT Ventricular Rate:  116 PR Interval:  175 QRS Duration: 104 QT Interval:  331 QTC Calculation: 460 R Axis:   132 Text Interpretation: Sinus tachycardia Left atrial enlargement Left posterior fascicular block Abnormal R-wave progression, late transition Nonspecific ST changes Confirmed by Regan Lemming ([id number]) on 11/28/2021 4:30:22 PM  Radiology No results  found.  Procedures Procedures    Medications Ordered in ED Medications  lactated ringers bolus 1,000 mL (1,000 mLs Intravenous New Bag/Given 11/28/21 1629)  hydrOXYzine (ATARAX) tablet 50 mg (50 mg Oral Given 11/28/21 1629)    ED Course/ Medical Decision Making/ A&P                           Medical Decision Making Amount and/or Complexity of Data Reviewed Labs: ordered.  Risk Prescription drug management.   66 year old male with a medical history of bipolar disorder, depression, anxiety, panic disorder who presents to the emergency department with a chief complaint of diffuse pain all over and anxiousness.  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  He endorses persistent symptoms of agitation and whole body pain.  He has had a chronic tic-like cough for the past 3 years.  He endorses passive thoughts of harming himself in the setting of his symptoms.  He denies any active thoughts of suicidal ideation, homicidal ideation, audiovisual hallucinations.  He describes his whole body pain as all over from head to toe.   He endorses restlessness.  On chart review, he has been seen multiple times in the emergency department for similar complaints.  He follows outpatient with psychiatry and has previously been on Atarax and propranolol.  He states that he has hesitancy in taking any medications and currently only endorses taking amitriptyline.  He states that he has been self-medicating with kratom and took a large amount this morning. He states that he took the supplement by mouth. He denies any other supplement or stimulant use.   On arrival, the patient was afebrile, tachycardic P in the 120s to 130s, mildly tachypneic RR 28, hypertensive BP 145/116, saturating 96% on room air.  Sinus tachycardia noted on cardiac telemetry.  Patient events with anxiousness, agitation, palpitations, restlessness in the setting of kratom use and what he describes as chronic withdrawal from benzodiazepines.  Denies any other drug use at this time.  He denies any visual field deficit, blurry vision, chest pain, shortness of breath.  He endorses whole body pain which has bothered him intermittently for the past 3 years.  He also presents with a tic-like cough.  This has been documented on several other healthcare visits as well.  Has a history of multiple presentations to the emergency department with a similar type of presentation.  He is neurologically intact.  He has no clonus, low concern for serotonin syndrome, considered thyroid dysfunction/hyperthyroidism, dehydration, anxiety, other substance abuse although lower concern for other substance abuse beyond kratom use as the patient denies this. Also considered manic/hypomanic episode given his agitation and pressured speech.  Patient endorses passive SI in the setting of his symptoms and voluntarily consents to psychiatric consultation while in the emergency department. Would benefit from psychiatric consultation following medical clearance.  His initial EKG revealed sinus tachycardia with  nonspecific ST changes in the setting of his tachycardia.  Broader work-up was obtained for medical clearance.  The patient was administered an IV fluid bolus and p.o. Atarax with subsequent improvement in his heart rate.   His evaluation was significant for CBC without a leukocytosis or anemia, urinalysis with trace hemoglobin, trace protein, negative for UTI, hepatic function panel unremarkable, UDS with no abnormalities, TSH normal Tylenol and salicylate levels negative, D-dimer negative, low sufficient for PE, initial troponin 5, low concern  for ACS.  Patient's heart rate improved to 97 on recheck following IV fluids and Atarax.  COVID-19 and influenza PCR testing was collected and resulted normal.  Patient could potentially benefit from outpatient substance abuse treatment and resources were placed in his AVS in the event that he could be discharged. I did reviewed the patient's PDMP and do not see any recent prescriptions for benzodiazepines or opiates at this time.   At this time, feel that the patient is medically stabilized and clear for TTS consultation.  Patient is here voluntarily, no indication for involuntary commitment at this time.      Final Clinical Impression(s) / ED Diagnoses Final diagnoses:  Anxiety  Substance abuse [facility])    Rx / DC Orders ED Discharge Orders     None             Regan Lemming, MD 11/28/21 2011

## 2021-11-28 NOTE — ED Notes (Addendum)
CBG 134. Did not cross over. RN Zenia Resides aware.

## 2021-11-28 NOTE — ED Notes (Signed)
Pt given ice and ginger ale, temperature in room turned down per pt request. Denies other needs at this time.

## 2021-11-28 NOTE — ED Notes (Signed)
Pt watching tv, lights dimmed for comfort, pt visibly uncomfortable and moving in bed but denies any needs at this time, urinal emptied and given back to pt.

## 2021-11-28 NOTE — ED Triage Notes (Signed)
Patient here POV from Home.  Endorses Body Pain for approximately 3 years. Intermittent in [place] and the Patient endorses this Shelly Coss has been occurring for approximately 30 Days.   Uncomfortable during Triage. A&Ox4. GCS 15. Ambulatory.

## 2021-11-28 NOTE — ED Notes (Addendum)
Spoke with [facility] regarding TTS consult. They are aware of consult.

## 2021-11-28 NOTE — ED Notes (Signed)
Pt reports he has had thoughts about hurting himself due to his chronic pain and not feeling better. He denies suicidal thoughts at this time, and states he wants to live, and reports he wants to speak to psychiatry.

## 2021-11-29 ENCOUNTER — Ambulatory Visit (INDEPENDENT_AMBULATORY_CARE_PROVIDER_SITE_OTHER)
Admission: EM | Admit: 2021-11-29 | Discharge: 2021-11-29 | Disposition: A | Discharge status: Home / Self Care | Payer: Medicare Other | Source: Home / Self Care

## 2021-11-29 DIAGNOSIS — R45851 Suicidal ideations: Secondary | ICD-10-CM | POA: Diagnosis not present

## 2021-11-29 DIAGNOSIS — R52 Pain, unspecified: Secondary | ICD-10-CM | POA: Diagnosis not present

## 2021-11-29 DIAGNOSIS — F319 Bipolar disorder, unspecified: Secondary | ICD-10-CM | POA: Insufficient documentation

## 2021-11-29 DIAGNOSIS — F419 Anxiety disorder, unspecified: Secondary | ICD-10-CM | POA: Insufficient documentation

## 2021-11-29 DIAGNOSIS — G8929 Other chronic pain: Secondary | ICD-10-CM | POA: Insufficient documentation

## 2021-11-29 DIAGNOSIS — R4589 Other symptoms and signs involving emotional state: Secondary | ICD-10-CM | POA: Diagnosis not present

## 2021-11-29 DIAGNOSIS — M791 Myalgia, unspecified site: Secondary | ICD-10-CM | POA: Diagnosis not present

## 2021-11-29 DIAGNOSIS — R000 Tachycardia, unspecified: Secondary | ICD-10-CM | POA: Diagnosis not present

## 2021-11-29 DIAGNOSIS — Z20822 Contact with and (suspected) exposure to covid-19: Secondary | ICD-10-CM | POA: Diagnosis not present

## 2021-11-29 DIAGNOSIS — Z79899 Other long term (current) drug therapy: Secondary | ICD-10-CM | POA: Diagnosis not present

## 2021-11-29 MED ORDER — ALUM & MAG HYDROXIDE-SIMETH 200-200-20 MG/5ML PO SUSP: 30.0000 mL | ORAL | Status: DC | PRN

## 2021-11-29 MED ORDER — MAGNESIUM HYDROXIDE 400 MG/5ML PO SUSP: 30.0000 mL | Freq: Every day | ORAL | Status: DC | PRN

## 2021-11-29 MED ORDER — HYDROXYZINE HCL 25 MG PO TABS: 50.0000 mg | ORAL_TABLET | Freq: Three times a day (TID) | ORAL | Status: DC

## 2021-11-29 MED ORDER — AMITRIPTYLINE HCL 10 MG PO TABS
20.0000 mg | ORAL_TABLET | Freq: Every day | ORAL | Status: DC
  Filled 2021-11-29: qty 2

## 2021-11-29 MED ORDER — ACETAMINOPHEN 325 MG PO TABS: 650.0000 mg | ORAL_TABLET | Freq: Four times a day (QID) | ORAL | Status: DC | PRN

## 2021-11-29 NOTE — ED Notes (Addendum)
Pt admitted to obs due to passive SI related to chronic pain. Pt reports "severe" generalized pain and is grimacing. Evette Georges, NP notified of pt's pain and vital signs. Pt A&O x4, calm and cooperative. Denies current SI/HI/AVH. Pt ambulated independently to unit. Oriented to unit/staff. Pt declined offer for food at this time; water provided. No signs of acute distress noted. Monitoring for safety.

## 2021-11-29 NOTE — ED Provider Notes (Signed)
[facility] Continuous Assessment Admission H&P  Date: 11/29/21 Patient Name: [name] MRN: [id number] Chief Complaint: No chief complaint on file.     Diagnoses:  Final diagnoses:  Pain management  Suicidal ideation  Anxious appearance    HPI: [name],  66 y.o male with a history of GAD,  depression and  generalized pain was transferred from [facility], for suicidal thoughts and in chronic pain.  Per the patient he thinks he is in withdrawal however patient stated he was on a large volume of Valium for a couple years, however his last usages of Valium was 2 years ago. Patient denies seeing a psychiatrist or seeing a therapist at this time.  Notes for [facility],   66 year old male with a medical history of bipolar disorder, depression, anxiety, panic disorder who presents to the emergency department with a chief complaint of diffuse pain all over and anxiousness.  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  He endorses persistent symptoms of agitation and whole body pain.  He has had a chronic tic-like cough for the past 3 years.  He endorses passive thoughts of harming himself in the setting of his symptoms.  He denies any active thoughts of suicidal ideation, homicidal ideation, audiovisual hallucinations.  He describes his whole body pain as all over from head to toe.  He endorses restlessness.  On chart review, he has been seen multiple times in the emergency department for similar complaints.  He follows outpatient with psychiatry and has previously been on Atarax and propranolol.  He states that he has hesitancy in taking any medications and currently only endorses taking amitriptyline.  He states that he has been self-medicating with kratom and took a large amount this morning. He states that he took the supplement by mouth. He denies any other supplement or stimulant use.    Face-to-face observation of patient, patient is alert and oriented x 4, speech is clear.  Maintained eye contact.  Patient is very anxious patient has a constant nagging cough, patient very fidgety keep complaining of pain.  When asked if he is suicidal patient says it is hard for him not to think about it but he does not have a plan or anything like that.  Patient denies HI, AVH, or paranoia.  Patient denies current smoking or illicit drug use patient denies alcohol use.  Per the patient he was on benzos for over 15 years and this is the side effects of what he is experiencing however patient has not had any benzos according to him since 2020.  Recommend observation  PHQ 2-9:  [facility] from 11/28/2021 in [facility] Counselor from 07/20/2020 in [facility] Video Visit from 07/12/2020 in [facility]  Thoughts that you would be better off dead, or of hurting yourself in some way Several days More than half the days Not at all  PHQ-9 Total Score '9 24 16       '$ Flowsheet Row ED from 11/29/2021 in [facility] ED from 11/28/2021 in [facility] ED from 08/23/2021 in [facility]  C-SSRS RISK CATEGORY Low Risk Low Risk No Risk        Total Time spent with patient: 15 minutes  Musculoskeletal  Strength & Muscle Tone: within normal limits Gait & Station: normal Patient leans: N/A  Psychiatric Specialty Exam  Presentation General Appearance: Casual  Eye Contact:Fair  Speech:Clear and Coherent  Speech Volume:Normal  Handedness:Ambidextrous   Mood and Affect  Mood:Anxious; Irritable  Affect:Congruent   Thought Process  Thought Processes:Coherent  Descriptions of Associations:Circumstantial  Orientation:Full (Time, Place and Person)  Thought Content:WDL  Diagnosis of Schizophrenia or Schizoaffective  disorder in past: No   Hallucinations:Hallucinations: None  Ideas of Reference:None  Suicidal Thoughts:Suicidal Thoughts: Yes, Passive SI Passive Intent and/or Plan: Without Plan  Homicidal Thoughts:Homicidal Thoughts: No   Sensorium  Memory:Immediate [place]  Insight:Fair   Executive Functions  Concentration:Fair  Attention Span:Good  [place] of Knowledge:Good  Language:Good   Psychomotor Activity  Psychomotor Activity:Psychomotor Activity: Normal   Assets  Assets:Housing   Sleep  Sleep:Sleep: Fair   Nutritional Assessment (For OBS and FBC admissions only) Has the patient had a weight loss or gain of 10 pounds or more in the last 3 months?: No Has the patient had a decrease in food intake/or appetite?: No Does the patient have dental problems?: No Does the patient have eating habits or behaviors that may be indicators of an eating disorder including binging or inducing vomiting?: No Has the patient recently lost weight without trying?: 0 Has the patient been eating poorly because of a decreased appetite?: 0 Malnutrition Screening Tool Score: 0    Physical Exam HENT:     Head: Normocephalic.     Nose: Nose normal.  Cardiovascular:     Rate and Rhythm: Normal rate.  Pulmonary:     Effort: Pulmonary effort is normal.  Musculoskeletal:        General: Normal range of motion.     Cervical back: Normal range of motion.  Neurological:     General: No focal deficit present.     Mental Status: He is alert.  Psychiatric:        Mood and Affect: Mood normal.        Behavior: Behavior normal.        Thought Content: Thought content normal.        Judgment: Judgment normal.    Review of Systems  Constitutional: Negative.   HENT: Negative.    Eyes: Negative.   Respiratory: Negative.    Cardiovascular: Negative.   Gastrointestinal: Negative.   Genitourinary: Negative.   Musculoskeletal: Negative.   Skin: Negative.    Neurological: Negative.   Endo/Heme/Allergies: Negative.   Psychiatric/Behavioral:  Positive for suicidal ideas. The patient is nervous/anxious.     Blood pressure (!) 146/105, pulse 100, temperature 98.3 F (36.8 C), temperature source Oral, resp. rate 20, SpO2 96 %. There is no height or weight on file to calculate BMI.  Past Psychiatric History: anxiety   Is the patient at risk to self? Yes  Has the patient been a risk to self in the past 6 months? Yes .    Has the patient been a risk to self within the distant past? No   Is the patient a risk to others? No   Has the patient been a risk to others in the past 6 months? No   Has the patient been a risk to others within the distant past? No   Past Medical History:  Past Medical History:  Diagnosis Date   Alcohol abuse    Allergy    Bipolar disorder ([place])    Chronic pain    lower back pain, "that has progressed"   Depression    Diverticulosis    Dyspnea  Hepatitis C    hx. of"states he no longer has"" is clear".   History of recreational drug use    "cocaine, marijuana, polysubstance" quit 1987.   HLD (hyperlipidemia)    Hypertension    Pancreatitis 2015   Panic attack    Sleep apnea    no cpap used,still has machine(use rarely)    Past Surgical History:  Procedure Laterality Date   CARDIAC CATHETERIZATION  2003   No CAD   EUS N/A 06/23/2014   Procedure: UPPER ENDOSCOPIC ULTRASOUND (EUS) LINEAR;  Surgeon: Milus Banister, MD;  Location: [facility];  Service: Endoscopy;  Laterality: N/A;   sinus surgery     TONSILLECTOMY      Family History:  Family History  Problem Relation Age of Onset   Heart disease Mother    Kidney disease Mother    COPD Mother    Lupus Mother    Heart disease Father    Breast cancer Maternal Grandmother    Diabetes Maternal Grandfather     Social History:  Social History   Socioeconomic History   Marital status: Divorced    Spouse name: Not on file   Number of children: 0    Years of education: GED   Highest education level: Not on file  Occupational History   Occupation: DISABLED   Occupation:    Tobacco Use   Smoking status: Former    Packs/day: 2.00    Years: 16.00    Total pack years: 32.00    Types: Cigarettes    Quit date: 04/08/2002    Years since quitting: 19.6   Smokeless tobacco: Never  Vaping Use   Vaping Use: Never used  Substance and Sexual Activity   Alcohol use: No    Alcohol/week: 0.0 standard drinks of alcohol    Comment: Quit in 1987   Drug use: Yes    Types: Marijuana   Sexual activity: Not Currently    Partners: Female  Other Topics Concern   Not on file  Social History Narrative   Patient lives at home alone.    Disabled.   Education GED    Both handed.   Caffeine two cups of coffee daily.      Social Determinants of Health   Financial Resource Strain: Not on file  Food Insecurity: Not on file  Transportation Needs: Not on file  Physical Activity: Not on file  Stress: Not on file  Social Connections: Not on file  Intimate Partner Violence: Not on file    SDOH:  SDOH Screenings   Alcohol Screen: Not on file  Depression (PHQ2-9): Medium Risk (11/29/2021)   Depression (PHQ2-9)    PHQ-2 Score: 9  Financial Resource Strain: Not on file  Food Insecurity: Not on file  Housing: Not on file  Physical Activity: Not on file  Social Connections: Not on file  Stress: Not on file  Tobacco Use: Medium Risk (11/28/2021)   Patient History    Smoking Tobacco Use: Former    Smokeless Tobacco Use: Never    Passive Exposure: Not on file  Transportation Needs: Not on file    Last Labs:  Admission on 11/28/2021, Discharged on 11/29/2021  Component Date Value Ref Range Status   SARS Coronavirus 2 by RT PCR 11/28/2021 NEGATIVE  NEGATIVE Final   Comment: (NOTE) SARS-CoV-2 target nucleic acids are NOT DETECTED.  The SARS-CoV-2 RNA is generally detectable in upper respiratory specimens during the acute phase of infection.  The lowest concentration of SARS-CoV-2 viral  copies this assay can detect is 138 copies/mL. A negative result does not preclude SARS-Cov-2 infection and should not be used as the sole basis for treatment or other patient management decisions. A negative result may occur with  improper specimen collection/handling, submission of specimen other than nasopharyngeal swab, presence of viral mutation(s) within the areas targeted by this assay, and inadequate number of viral copies(<138 copies/mL). A negative result must be combined with clinical observations, patient history, and epidemiological information. The expected result is Negative.  Fact Sheet for Patients:  EntrepreneurPulse.com.au  Fact Sheet for Healthcare Providers:  IncredibleEmployment.be  This test is no                          t yet approved or cleared by the [place] FDA and  has been authorized for detection and/or diagnosis of SARS-CoV-2 by FDA under an Emergency Use Authorization (EUA). This EUA will remain  in effect (meaning this test can be used) for the duration of the COVID-19 declaration under Section 564(b)(1) of the Act, 21 U.S.C.section 360bbb-3(b)(1), unless the authorization is terminated  or revoked sooner.       Influenza A by PCR 11/28/2021 NEGATIVE  NEGATIVE Final   Influenza B by PCR 11/28/2021 NEGATIVE  NEGATIVE Final   Comment: (NOTE) The Xpert Xpress SARS-CoV-2/FLU/RSV plus assay is intended as an aid in the diagnosis of influenza from Nasopharyngeal swab specimens and should not be used as a sole basis for treatment. Nasal washings and aspirates are unacceptable for Xpert Xpress SARS-CoV-2/FLU/RSV testing.  Fact Sheet for Patients: EntrepreneurPulse.com.au  Fact Sheet for Healthcare Providers: IncredibleEmployment.be  This test is not yet approved or cleared by the [place] FDA and has been authorized for  detection and/or diagnosis of SARS-CoV-2 by FDA under an Emergency Use Authorization (EUA). This EUA will remain in effect (meaning this test can be used) for the duration of the COVID-19 declaration under Section 564(b)(1) of the Act, 21 U.S.C. section 360bbb-3(b)(1), unless the authorization is terminated or revoked.  Performed at [facility], [address] [place], [place] [address]    Glucose-Capillary 11/28/2021 134 (H)  70 - 99 mg/dL Final   Glucose reference range applies only to samples taken after fasting for at least 8 hours.   TSH 11/28/2021 2.434  0.350 - 4.500 uIU/mL Final   Comment: Performed by a 3rd Generation assay with a functional sensitivity of <=0.01 uIU/mL. Performed at [facility], [address] [place], [place] [address]    WBC 11/28/2021 9.6  4.0 - 10.5 K/uL Final   RBC 11/28/2021 5.50  4.22 - 5.81 MIL/uL Final   Hemoglobin 11/28/2021 14.5  13.0 - 17.0 g/dL Final   HCT 11/28/2021 42.8  39.0 - 52.0 % Final   MCV 11/28/2021 77.8 (L)  80.0 - 100.0 fL Final   MCH 11/28/2021 26.4  26.0 - 34.0 pg Final   MCHC 11/28/2021 33.9  30.0 - 36.0 g/dL Final   RDW 11/28/2021 13.6  11.5 - 15.5 % Final   Platelets 11/28/2021 268  150 - 400 K/uL Final   nRBC 11/28/2021 0.0  0.0 - 0.2 % Final   Neutrophils Relative % 11/28/2021 69  % Final   Neutro Abs 11/28/2021 6.6  1.7 - 7.7 K/uL Final   Lymphocytes Relative 11/28/2021 21  % Final   Lymphs Abs 11/28/2021 2.0  0.7 - 4.0 K/uL Final   Monocytes Relative 11/28/2021 10  % Final  Monocytes Absolute 11/28/2021 1.0  0.1 - 1.0 K/uL Final   Eosinophils Relative 11/28/2021 0  % Final   Eosinophils Absolute 11/28/2021 0.0  0.0 - 0.5 K/uL Final   Basophils Relative 11/28/2021 0  % Final   Basophils Absolute 11/28/2021 0.0  0.0 - 0.1 K/uL Final   Immature Granulocytes 11/28/2021 0  % Final   Abs Immature Granulocytes 11/28/2021 0.03  0.00 - 0.07 K/uL Final   Performed at [facility], [place], [place] [address]   Sodium 11/28/2021 138  135 - 145 mmol/L Final   Potassium 11/28/2021 4.2  3.5 - 5.1 mmol/L Final   Chloride 11/28/2021 104  98 - 111 mmol/L Final   CO2 11/28/2021 24  22 - 32 mmol/L Final   Glucose, Bld 11/28/2021 129 (H)  70 - 99 mg/dL Final   Glucose reference range applies only to samples taken after fasting for at least 8 hours.   BUN 11/28/2021 18  8 - 23 mg/dL Final   Creatinine, Ser 11/28/2021 1.15  0.61 - 1.24 mg/dL Final   Calcium 11/28/2021 9.5  8.9 - 10.3 mg/dL Final   GFR, Estimated 11/28/2021 >60  >60 mL/min Final   Comment: (NOTE) Calculated using the CKD-EPI Creatinine Equation (2021)    Anion gap 11/28/2021 10  5 - 15 Final   Performed at [facility], [address], [place], [place] [address]   Opiates 11/28/2021 NONE DETECTED  NONE DETECTED Final   Cocaine 11/28/2021 NONE DETECTED  NONE DETECTED Final   Benzodiazepines 11/28/2021 NONE DETECTED  NONE DETECTED Final   Amphetamines 11/28/2021 NONE DETECTED  NONE DETECTED Final   Tetrahydrocannabinol 11/28/2021 NONE DETECTED  NONE DETECTED Final   Barbiturates 11/28/2021 NONE DETECTED  NONE DETECTED Final   Comment: (NOTE) DRUG SCREEN FOR MEDICAL PURPOSES ONLY.  IF CONFIRMATION IS NEEDED FOR ANY PURPOSE, NOTIFY LAB WITHIN 5 DAYS.  LOWEST DETECTABLE LIMITS FOR URINE DRUG SCREEN Drug Class                     Cutoff (ng/mL) Amphetamine and metabolites    1000 Barbiturate and metabolites    200 Benzodiazepine                 950 Tricyclics and metabolites     300 Opiates and metabolites        300 Cocaine and metabolites        300 THC                            50 Performed at [facility], [address] [place], [place] [address]    Color, Urine 11/28/2021 YELLOW  YELLOW Final   APPearance 11/28/2021 CLEAR  CLEAR Final   Specific Gravity, Urine 11/28/2021 1.017  1.005 - 1.030 Final   pH 11/28/2021  6.0  5.0 - 8.0 Final   Glucose, UA 11/28/2021 NEGATIVE  NEGATIVE mg/dL Final   Hgb urine dipstick 11/28/2021 TRACE (A)  NEGATIVE Final   Bilirubin Urine 11/28/2021 NEGATIVE  NEGATIVE Final   Ketones, ur 11/28/2021 NEGATIVE  NEGATIVE mg/dL Final   Protein, ur 11/28/2021 TRACE (A)  NEGATIVE mg/dL Final   Nitrite 11/28/2021 NEGATIVE  NEGATIVE Final   Leukocytes,Ua 11/28/2021 NEGATIVE  NEGATIVE Final   RBC / HPF 11/28/2021 0-5  0 - 5 RBC/hpf Final   WBC, UA 11/28/2021 0-5  0 - 5 WBC/hpf Final   Mucus 11/28/2021 PRESENT  Final   Hyaline Casts, UA 11/28/2021 PRESENT   Final   Performed at [facility], [address] [place], [place] [address]   Total Protein 11/28/2021 7.3  6.5 - 8.1 g/dL Final   Albumin 11/28/2021 4.6  3.5 - 5.0 g/dL Final   AST 11/28/2021 16  15 - 41 U/L Final   ALT 11/28/2021 10  0 - 44 U/L Final   Alkaline Phosphatase 11/28/2021 67  38 - 126 U/L Final   Total Bilirubin 11/28/2021 0.5  0.3 - 1.2 mg/dL Final   Bilirubin, Direct 11/28/2021 <0.1  0.0 - 0.2 mg/dL Final   Indirect Bilirubin 11/28/2021 NOT CALCULATED  0.3 - 0.9 mg/dL Final   Performed at [facility], [address], [place], [place] [address]   Troponin I (High Sensitivity) 11/28/2021 5  <18 ng/L Final   Comment: (NOTE) Elevated high sensitivity troponin I (hsTnI) values and significant  changes across serial measurements may suggest ACS but many other  chronic and acute conditions are known to elevate hsTnI results.  Refer to the "Links" section for chest pain algorithms and additional  guidance. Performed at [facility], [place], [place] [address]    D-Dimer, Quant 11/28/2021 0.29  0.00 - 0.50 ug/mL-FEU Final   Comment: (NOTE) At the manufacturer cut-off value of 0.5 g/mL FEU, this assay has a negative predictive value of 95-100%.This assay is intended for use in conjunction with a clinical pretest probability (PTP)  assessment model to exclude pulmonary embolism (PE) and deep venous thrombosis (DVT) in outpatients suspected of PE or DVT. Results should be correlated with clinical presentation. Performed at [facility], [place], [place] [address]    Acetaminophen (Tylenol), Serum 11/28/2021 <10 (L)  10 - 30 ug/mL Final   Comment: (NOTE) Therapeutic concentrations vary significantly. A range of 10-30 ug/mL  may be an effective concentration for many patients. However, some  are best treated at concentrations outside of this range. Acetaminophen concentrations >150 ug/mL at 4 hours after ingestion  and >50 ug/mL at 12 hours after ingestion are often associated with  toxic reactions.  Performed at [facility], [address], [place], [place] [address]    Salicylate Lvl 43/32/9518 <7.0 (L)  7.0 - 30.0 mg/dL Final   Performed at [facility], [address] [place], [place] [address]    Allergies: Morphine and related and Diazepam  PTA Medications: (Not in a hospital admission)   Medical Decision Making  Inpatient observation    Meds ordered this encounter  Medications   acetaminophen (TYLENOL) tablet 650 mg   alum & mag hydroxide-simeth (MAALOX/MYLANTA) 200-200-20 MG/5ML suspension 30 mL   magnesium hydroxide (MILK OF MAGNESIA) suspension 30 mL    Lab Orders         Resp Panel by RT-PCR (Flu A&B, Covid) Anterior Nasal Swab      Recommendations  Based on my evaluation the patient does not appear to have an emergency medical condition.  Evette Georges, NP 11/29/21  6:13 AM

## 2021-11-29 NOTE — Discharge Instructions (Addendum)
Please follow up with your primary care provider for referral to pain management.  Additionally, please reach out to [facility] to see if you can schedule a new appointment.   Patient is instructed prior to discharge to: Take all medications as prescribed by his/her mental healthcare provider. Report any adverse effects and or reactions from the medicines to his/her outpatient provider promptly. Keep all scheduled appointments, to ensure that you are getting refills on time and to avoid any interruption in your medication.  If you are unable to keep an appointment call to reschedule.  Be sure to follow-up with resources and follow-up appointments provided.  Patient has been instructed & cautioned: To not engage in alcohol and or illegal drug use while on prescription medicines. In the event of worsening symptoms, patient is instructed to call the crisis hotline, 911 and or go to the nearest ED for appropriate evaluation and treatment of symptoms. To follow-up with his/her primary care provider for your other medical issues, concerns and or health care needs.

## 2021-11-29 NOTE — ED Notes (Signed)
Pt denied SI, HI or AVH.  He was evaluated by provider and discharged.  Pt was  given a taxi voucher along with AVS and follow up instructions.  Pt verbalized understanding.  He was calm and cooperative and given back all his belongings from locker. Pt was escorted to lobby to wait for taxi.  No distress noted.

## 2021-11-29 NOTE — ED Notes (Signed)
Report given to [facility] at [facility]

## 2021-11-29 NOTE — BH Assessment (Signed)
Comprehensive Clinical Assessment (CCA) Note  11/29/2021 [name] [id number]  Disposition: Per Erasmo Score, NP overnight observation is recommended for further monitoring with AM reassessment by psychiatry to determine the most appropriate disposition plan.  Medications have been ordered by EDP to address pain.   The patient demonstrates the following risk factors for suicide: Chronic risk factors for suicide include: psychiatric disorder of Bipolar Disorder, substance use disorder, and previous suicide attempts x1 15 yrs ago by heroin overdose . Acute risk factors for suicide include: social withdrawal/isolation and chronic pain . Protective factors for this patient include: positive social support, positive therapeutic relationship, and hope for the future. Considering these factors, the overall suicide risk at this point appears to be moderate. Patient is appropriate for outpatient follow up once stabilized.   Per EDP note: "Patient is a 66 year old male with a history of  who presents voluntarily/involuntarily to [facility] for assessment.  66 year old male with a medical history of bipolar disorder, depression, anxiety, panic disorder who presents to the emergency department with a chief complaint of diffuse pain all over and anxiousness.  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  He endorses persistent symptoms of agitation and whole body pain.  He has had a chronic tic-like cough for the past 3 years.  He endorses passive thoughts of harming himself in the setting of his symptoms.  He denies any active thoughts of suicidal ideation, homicidal ideation, audiovisual hallucinations.  He describes his whole body pain as all over from head to toe.  He endorses restlessness.  On chart review, he has been seen multiple times in the emergency department for similar complaints.  He  follows outpatient with psychiatry and has previously been on Atarax and propranolol.  He states that he has hesitancy in taking any medications and currently only endorses taking amitriptyline.  He states that he has been self-medicating with kratom and took a large amount this morning. He states that he took the supplement by mouth. He denies any other supplement or stimulant use."  Upon assessment, patient presents with pressured speech with a persistent/constant cough throughout assessment.  He is doubled over reporting significant pain as the reason he presented to the ED.  Patient is followed by Burt Ek, NP with [facility] for med management.  He spoke with her two days ago and had indicated he would likely present to the ED, stating he was having difficulty managing the pain.  He had shared with her that he has not been taking Rx amitriptyline consistently.  She encouraged him to take amitriptyline as prescribed.  Patient states, "I messed up. I took 6 the other night.  I needed some type of relief."  He has also been self-medicating with 2 tsp of Kratom daily for the past two weeks.  He has found this to improve symptoms for a period and then later symptoms return and pain seems worse.  Patient states he doesn't know what to do, as he cannot bear the pain he is experiencing.  He endorses passive SI, however he denies having a plan and states he is inclined to "push through it," however he does feel he is "losing hope."  He reports hx of one past attempt by overdose on heroin 15 yrs ago after his brother died.  Patient denies HI and AVH.  Patient is primarily focused on pain management/pain relief at this point  and is hopeful that the EDP will order medication for symptom relief.   Chief Complaint:  Chief Complaint  Patient presents with   Pain   Visit Diagnosis: Bipolar Disorder                             Other Psychoactive Substance Abuse - Kratom    Flowsheet [place] ED from 11/28/2021 in  [facility] Counselor from 07/20/2020 in [facility] Video Visit from 07/12/2020 in [facility]  Thoughts that you would be better off dead, or of hurting yourself in some way Several days More than half the days Not at all  PHQ-9 Total Score '9 24 16      '$ Flowsheet Row ED from 11/28/2021 in [facility] ED from 08/23/2021 in [place] [facility] ED from 06/24/2021 in [place] [facility]  C-SSRS RISK CATEGORY Low Risk No Risk No Risk     8/24 - Risk is Moderate    CCA Screening, Triage and Referral (STR)  Patient Reported Information How did you hear about [place]? Family/Friend  What Is the Reason for Your Visit/Call Today? Patient presented to the ED, after he called a friend to take him due to ongoing/worsening pain.  Patient has chronic pain.  He is followed by PCP and Burt Ek, NP for med management.  He has been taking Rx amytriptaline, although he has been taking it inconsistently.  He reports feeling depsperate for relief and he resorted to self-medicating with kratum fot he past two weeks.  Patient informed [place] two days ago that he would likely be presenting to an ED for help with managing the pain.   Patient endorses passive SI, and states he believes he "will probably just push through, but I''m not sure with this pain I am dealing with."  How Long Has This Been Causing You Problems? > than 6 months  What Do You Feel Would Help You the Most Today? Treatment for Depression or other mood problem; Alcohol or Drug Use Treatment   Have You Recently Had Any Thoughts About Hurting Yourself? Yes  Are You Planning to Commit Suicide/Harm Yourself At This time? No   Have you Recently Had Thoughts About [place]? No  Are You Planning to Harm Someone at This Time? No  Explanation: No data recorded  Have You  Used Any Alcohol or Drugs in the Past 24 Hours? Yes  How Long Ago Did You Use Drugs or Alcohol? No data recorded What Did You Use and How Much? Kratum, 2 tsp this morning   Do You Currently Have a Therapist/Psychiatrist? Yes  Name of Therapist/Psychiatrist: Patient is followed by Burt Ek, NP for med management.   Have You Been Recently Discharged From Any Office Practice or Programs? No  Explanation of Discharge From Practice/Program: No data recorded    CCA Screening Triage Referral Assessment Type of Contact: Tele-Assessment  Telemedicine Service Delivery: Telemedicine service delivery: This service was provided via telemedicine using a 2-way, interactive audio and video technology  Is this Initial or Reassessment? Initial Assessment  Date Telepsych consult ordered in [facility]:  11/29/21  Time Telepsych consult ordered in [facility]:  0139  Location of Assessment: Other (comment) ([place])  Provider Location: [facility]   Collateral Involvement: N/A   Does Patient Have a Tome? No data recorded Name and Contact of Legal Guardian:  No data recorded If Minor and Not Living with Parent(s), Who has Custody? No data recorded Is CPS involved or ever been involved? Never  Is APS involved or ever been involved? Never   Patient Determined To Be At Risk for Harm To Self or Others Based on Review of Patient Reported Information or Presenting Complaint? No  Method: No data recorded Availability of Means: No data recorded Intent: No data recorded Notification Required: No data recorded Additional Information for Danger to Others Potential: No data recorded Additional Comments for Danger to Others Potential: No data recorded Are There Guns or Other Weapons in Your Home? No data recorded Types of Guns/Weapons: No data recorded Are These Weapons Safely Secured?                            No data recorded Who Could Verify You Are Able To  Have These Secured: No data recorded Do You Have any Outstanding Charges, Pending Court Dates, Parole/Probation? No data recorded Contacted To Inform of Risk of Harm To Self or Others: No data recorded   Does Patient Present under Involuntary Commitment? No  IVC Papers Initial File Date: No data recorded  [place] of Residence: [place]   Patient Currently Receiving the Following Services: Individual Therapy; Medication Management   Determination of Need: Urgent (48 hours)   Options For Referral: Intensive Outpatient Therapy; Medication Management; Outpatient Therapy     CCA Biopsychosocial Patient Reported Schizophrenia/Schizoaffective Diagnosis in Past: No   Strengths: Seeking treatment, has support from several friends   Mental Health Symptoms Depression:   Change in energy/activity; Hopelessness; Sleep (too much or little)   Duration of Depressive symptoms:  Duration of Depressive Symptoms: Greater than two weeks   Mania:   Racing thoughts; Irritability   Anxiety:    Difficulty concentrating; Worrying; Restlessness; Sleep; Tension   Psychosis:   None   Duration of Psychotic symptoms:    Trauma:   None   Obsessions:   None   Compulsions:   None   Inattention:   None   Hyperactivity/Impulsivity:   N/A   Oppositional/Defiant Behaviors:   None   Emotional Irregularity:   None   Other Mood/Personality Symptoms:  No data recorded   Mental Status Exam Appearance and self-care  Stature:   Average   Weight:   Average weight   Clothing:   Casual   Grooming:   Normal   Cosmetic use:   None   Posture/gait:   Normal   Motor activity:   Not Remarkable   Sensorium  Attention:   Normal   Concentration:   Normal   Orientation:   X5   Recall/memory:   Normal   Affect and Mood  Affect:   Anxious   Mood:   Anxious   Relating  Eye contact:   None   Facial expression:   Tense   Attitude toward examiner:    Cooperative   Thought and Language  Speech flow:  Clear and Coherent   Thought content:   Appropriate to Mood and Circumstances   Preoccupation:   None   Hallucinations:   None   Organization:  No data recorded  [employer] of Knowledge:   Fair   Intelligence:   Average   Abstraction:   Normal   Judgement:   Fair   Art therapist:   Realistic   Insight:   Gaps   Decision Making:   Normal; Vacilates; Impulsive  Social Functioning  Social Maturity:   Responsible   Social Judgement:   Normal   Stress  Stressors:   Other (Comment) (Medication issues)   Coping Ability:   Normal   Skill Deficits:   None; Interpersonal; Decision making; Responsibility   Supports:   Family     Religion: Religion/Spirituality Are You A Religious Person?: No  Leisure/Recreation: Leisure / Recreation Do You Have Hobbies?: No  Exercise/Diet: Exercise/Diet Do You Exercise?: No Have You Gained or Lost A Significant Amount of Weight in the Past Six Months?: No Do You Follow a Special Diet?: No Do You Have Any Trouble Sleeping?: Yes Explanation of Sleeping Difficulties: Patient states he sleeps a few hours "here and there" due to pain issues   CCA Employment/Education Employment/Work Situation: Employment / Work Situation Employment Situation: On disability Why is Patient on Disability: "pain and mental health problems" How Long has Patient Been on Disability: unknown Has Patient ever Been in the [employer]?: No  Education: Education Is Patient Currently Attending School?: No Last Grade Completed: 12 Did You Attend College?: No Did You Have An Individualized Education Program (IIEP): No Did You Have Any Difficulty At School?: No Patient's Education Has Been Impacted by Current Illness: No   CCA Family/Childhood History Family and Relationship History: Family history Marital status: Single Does patient have children?: Yes How many  children?: 1 How is patient's relationship with their children?: distant, patient states he feels his medical/pain issues became a burden for daughter.  He has limited contact with her as a result  Childhood History:  Childhood History By whom was/is the patient raised?: Mother, Father Did patient suffer any verbal/emotional/physical/sexual abuse as a child?: No Did patient suffer from severe childhood neglect?: No Has patient ever been sexually abused/assaulted/raped as an adolescent or adult?: No Was the patient ever a victim of a crime or a disaster?: No Witnessed domestic violence?: No Has patient been affected by domestic violence as an adult?: No  Child/Adolescent Assessment:     CCA Substance Use Alcohol/Drug Use: Alcohol / Drug Use Pain Medications: See MAR Prescriptions: See MAR Over the Counter: See MAR History of alcohol / drug use?: Yes Longest period of sobriety (when/how long): Patient has been using kratum daily for past two weeks - to self-medicate/manage pain Withdrawal Symptoms: Irritability, Patient aware of relationship between substance abuse and physical/medical complications                         ASAM's:  Six Dimensions of Multidimensional Assessment  Dimension 1:  Acute Intoxication and/or Withdrawal Potential:   Dimension 1:  Description of individual's past and current experiences of substance use and withdrawal: Hx of benzo withdrawal a few times, as he had been on and off valium for many years  Dimension 2:  Biomedical Conditions and Complications:   Dimension 2:  Description of patient's biomedical conditions and  complications: struggles to cope with discomfort, moans and rocks throughout assessment  Dimension 3:  Emotional, Behavioral, or Cognitive Conditions and Complications:  Dimension 3:  Description of emotional, behavioral, or cognitive conditions and complications: underlying anxiety, Dx w/ Bipolar, however noncomplaint with Rx meds   Dimension 4:  Readiness to Change:  Dimension 4:  Description of Readiness to Change criteria: noncompliant with treatment/medication recommendations often  Dimension 5:  Relapse, Continued use, or Continued Problem Potential:  Dimension 5:  Relapse, continued use, or continued problem potential critiera description: limited awareness of MH and SA relapse issues  Dimension 6:  Recovery/Living Environment:  Dimension 6:  Recovery/Iiving environment criteria description: passive support - friends have pulled away "some" due to pt's long term struggle with pain  ASAM Severity Score: ASAM's Severity Rating Score: 9  ASAM Recommended Level of Treatment: ASAM Recommended Level of Treatment: Level III Residential Treatment   Substance use Disorder (SUD) Substance Use Disorder (SUD)  Checklist Symptoms of Substance Use: Continued use despite having a persistent/recurrent physical/psychological problem caused/exacerbated by use, Continued use despite persistent or recurrent social, interpersonal problems, caused or exacerbated by use, Evidence of tolerance, Presence of craving or strong urge to use  Recommendations for Services/Supports/Treatments: Recommendations for Services/Supports/Treatments Recommendations For Services/Supports/Treatments: Individual Therapy, Medication Management  Discharge Disposition:    DSM5 Diagnoses: Patient Active Problem List   Diagnosis Date Noted   Paranoia ([place]) 10/17/2021   No-show for appointment 05/10/2020   Generalized anxiety disorder 10/13/2019   Trouble swallowing 08/19/2019   Depression 08/18/2019   Groin pain, left 05/20/2018   Abdominal pain, chronic, epigastric 11/30/2014   Chronic low back pain 10/17/2014   Disc degeneration, lumbar 10/17/2014   HLD (hyperlipidemia) 08/25/2014   Chest pain on exertion 09/07/2012   Memory problem 12/25/2011   GERD (gastroesophageal reflux disease) 12/18/2010   OSA on CPAP 09/20/2009   HYPERTENSION, BENIGN - Diet  Controlled 10/19/2008   HEPATITIS C 06/05/2006   Major depressive disorder, recurrent episode ([place]) 06/05/2006   BIPOLAR DISORDER 06/05/2006   Anxiety 06/05/2006   Hx of Alcohol abuse 06/05/2006     Referrals to Alternative Service(s): Referred to Alternative Service(s):   Place:   Date:   Time:    Referred to Alternative Service(s):   Place:   Date:   Time:    Referred to Alternative Service(s):   Place:   Date:   Time:    Referred to Alternative Service(s):   Place:   Date:   Time:     Fransico Meadow, [place] [facility]

## 2021-11-29 NOTE — ED Provider Notes (Signed)
FBC/OBS ASAP Discharge Summary  Date and Time: 11/29/2021 10:07 AM  Name: [name]  MRN:  [id number]   Discharge Diagnoses:  Final diagnoses:  Pain management  Suicidal ideation  Anxious appearance   Subjective:  Pt reports he presented to this facility following discharge from the emergency department. Per chart review, pt was seen at [facility] on 11/28/21 w/ cc of diffuse pain all over and anxiousness. Pt confirms this. He states he has been experiencing chronic pain for the past 3 years, although also notes he was experiencing pain prior to this as well. He describes pain as "nerve pain, sting all over, sandpaper grinding". 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 He states the last time he used benzodiazepines was in July 2022. Pt reports he feels frustrated as none of his medical providers believe him. Pt states he had a pain management appointment scheduled with [facility] a couple of months ago although he did not attend because he was feeling better. He states he regrets not attending this appointment.   Pt reports he feels anxiety secondary to pain. He reports poor appetite and sleep secondary to pain. He reports passive SI, without plan or intent. He states is related to his chronic pain. He tells me "Honestly, I want to live more than I ever did". He denies VI/HI, AVH, paranoia. Pt verbally contracts to safety for himself and others.   He reports hx of 1 SA 2 to 3 years ago, states he was drinking, used heroin, in an attempt to kill himself. He reports hx of multiple inpatient psychiatric hospitalizations related to past use of alcohol. He states he has not used alcohol in years.   Pt receives psychiatric medication management services at [facility]. He is prescribed Elavil '20mg'$  by mouth at bedtime. He states he takes medication as needed. He states he is worried about becoming dependent on his  medication. He does feel medication helps him with anxiety and pain. Discussed w/ pt taking medications as prescribed and voicing his concerns to his provider. Pt verbalized understanding.  Discussed w/ pt recommendation to follow up with his PCP for referral for pain management. He gives verbal consent for Probation officer to call [facility]. Writer attempted to schedule an appointment for pt w/ [facility], although was told that they would have to speak w/ their facility provider first. This was relayed to the pt. Discussed w/ pt recommendation to continue outpatient psychiatric services, pt is currently followed by outpatient psychiatric services at [facility] outpatient clinic. Pt states if there is any worsening of condition he will go to the nearest emergency room or call 911/EMS.  Stay Summary:  Pt is a 66 y/o male presenting to [facility] on 11/29/21. On reassessment pt reports passive SI, without plan or intent, which he states is related to his chronic pain. He denies VI/HI, AVH, paranoia. Pt discharged w/ recommendation to follow up with his PCP for referral for pain management and to continue outpatient psychiatric services.   Total Time spent with patient: 30 minutes  Past Psychiatric History:  Past Medical History:  Past Medical History:  Diagnosis Date   Alcohol abuse    Allergy    Bipolar disorder ([place])    Chronic pain    lower back pain, "that has progressed"   Depression    Diverticulosis    Dyspnea    Hepatitis C    hx. of"states he no  longer has"" is clear".   History of recreational drug use    "cocaine, marijuana, polysubstance" quit 1987.   HLD (hyperlipidemia)    Hypertension    Pancreatitis 2015   Panic attack    Sleep apnea    no cpap used,still has machine(use rarely)    Past Surgical History:  Procedure Laterality Date   CARDIAC CATHETERIZATION  2003   No CAD   EUS N/A 06/23/2014   Procedure: UPPER ENDOSCOPIC ULTRASOUND (EUS)  LINEAR;  Surgeon: Milus Banister, MD;  Location: [facility];  Service: Endoscopy;  Laterality: N/A;   sinus surgery     TONSILLECTOMY     Family History:  Family History  Problem Relation Age of Onset   Heart disease Mother    Kidney disease Mother    COPD Mother    Lupus Mother    Heart disease Father    Breast cancer Maternal Grandmother    Diabetes Maternal Grandfather    Family Psychiatric History: Pt denies knowledge of family psychiatric hx. Social History:  Social History   Substance and Sexual Activity  Alcohol Use No   Alcohol/week: 0.0 standard drinks of alcohol   Comment: Quit in 1987     Social History   Substance and Sexual Activity  Drug Use Yes   Types: Marijuana    Social History   Socioeconomic History   Marital status: Divorced    Spouse name: Not on file   Number of children: 0   Years of education: GED   Highest education level: Not on file  Occupational History   Occupation: DISABLED   Occupation:    Tobacco Use   Smoking status: Former    Packs/day: 2.00    Years: 16.00    Total pack years: 32.00    Types: Cigarettes    Quit date: 04/08/2002    Years since quitting: 19.6   Smokeless tobacco: Never  Vaping Use   Vaping Use: Never used  Substance and Sexual Activity   Alcohol use: No    Alcohol/week: 0.0 standard drinks of alcohol    Comment: Quit in 1987   Drug use: Yes    Types: Marijuana   Sexual activity: Not Currently    Partners: Female  Other Topics Concern   Not on file  Social History Narrative   Patient lives at home alone.    Disabled.   Education GED    Both handed.   Caffeine two cups of coffee daily.      Social Determinants of Health   Financial Resource Strain: Not on file  Food Insecurity: Not on file  Transportation Needs: Not on file  Physical Activity: Not on file  Stress: Not on file  Social Connections: Not on file   SDOH:  SDOH Screenings   Alcohol Screen: Not on file  Depression (PHQ2-9):  Medium Risk (11/29/2021)   Depression (PHQ2-9)    PHQ-2 Score: 9  Financial Resource Strain: Not on file  Food Insecurity: Not on file  Housing: Not on file  Physical Activity: Not on file  Social Connections: Not on file  Stress: Not on file  Tobacco Use: Medium Risk (11/28/2021)   Patient History    Smoking Tobacco Use: Former    Smokeless Tobacco Use: Never    Passive Exposure: Not on file  Transportation Needs: Not on file    Tobacco Cessation:  N/A, patient does not currently use tobacco products  Current Medications:  Current Facility-Administered Medications  Medication Dose Route Frequency  Provider Last Rate Last Admin   acetaminophen (TYLENOL) tablet 650 mg  650 mg Oral Q6H PRN Evette Georges, NP       alum & mag hydroxide-simeth (MAALOX/MYLANTA) 200-200-20 MG/5ML suspension 30 mL  30 mL Oral Q4H PRN Evette Georges, NP       magnesium hydroxide (MILK OF MAGNESIA) suspension 30 mL  30 mL Oral Daily PRN Evette Georges, NP       Current Outpatient Medications  Medication Sig Dispense Refill   amitriptyline (ELAVIL) 10 MG tablet Take 2 tablets (20 mg total) by mouth at bedtime. 60 tablet 2   hydrOXYzine (ATARAX) 50 MG tablet Take 1 tablet (50 mg total) by mouth 3 (three) times daily. 90 tablet 3   propranolol (INDERAL) 10 MG tablet Take 1 tablet (10 mg total) by mouth 3 (three) times daily. 90 tablet 3   risperiDONE (RISPERDAL) 0.5 MG tablet Take 1 tablet (0.5 mg total) by mouth at bedtime. 30 tablet 3    PTA Medications: (Not in a hospital admission)      11/29/2021    2:23 AM 07/20/2020   10:18 AM 07/12/2020   10:25 AM  Depression screen PHQ 2/9  Decreased Interest '1 3 1  '$ Down, Depressed, Hopeless '1 3 3  '$ PHQ - 2 Score '2 6 4  '$ Altered sleeping '2 3 3  '$ Tired, decreased energy '1 2 3  '$ Change in appetite 1 2 0  Feeling bad or failure about yourself  '1 3 3  '$ Trouble concentrating '1 3 3  '$ Moving slowly or fidgety/restless 0 3 0  Suicidal thoughts 1 2 0  PHQ-9 Score '9 24 16   '$ Difficult doing work/chores Somewhat difficult  Very difficult    Flowsheet Row ED from 11/29/2021 in [facility] ED from 11/28/2021 in [facility] ED from 08/23/2021 in [facility]  C-SSRS RISK CATEGORY Low Risk Low Risk No Risk       Musculoskeletal  Strength & Muscle Tone: within normal limits Gait & Station: normal Patient leans: N/A  Psychiatric Specialty Exam  Presentation  General Appearance: Casual  Eye Contact:Fair  Speech:Clear and Coherent; Normal Rate  Speech Volume:Normal  Handedness:   Mood and Affect  Mood:Anxious  Affect:Congruent   Thought Process  Thought Processes:Coherent  Descriptions of Associations:Intact  Orientation:Full (Time, Place and Person)  Thought Content:Logical  Diagnosis of Schizophrenia or Schizoaffective disorder in past: No    Hallucinations:Hallucinations: None  Ideas of Reference:None  Suicidal Thoughts:Suicidal Thoughts: Yes, Passive SI Passive Intent and/or Plan: Without Intent; Without Plan  Homicidal Thoughts:Homicidal Thoughts: No   Sensorium  Memory:Immediate Fair  Judgment:Fair  Insight:Fair   Executive Functions  Concentration:Fair  Attention Span:Fair  [place] of Knowledge:Good  Language:Good   Psychomotor Activity  Psychomotor Activity:Psychomotor Activity: Restlessness   Assets  Assets:Communication Skills; Desire for Improvement; Financial Resources/Insurance; Housing   Sleep  Sleep:Sleep: Poor   Nutritional Assessment (For OBS and FBC admissions only) Has the patient had a weight loss or gain of 10 pounds or more in the last 3 months?: No Has the patient had a decrease in food intake/or appetite?: No Does the patient have dental problems?: No Does the patient have eating habits or behaviors that may be indicators of an eating disorder including binging or inducing vomiting?:  No Has the patient recently lost weight without trying?: 0 Has the patient been eating poorly because of a decreased appetite?: 0 Malnutrition Screening Tool Score: 0  Physical Exam  Physical Exam Cardiovascular:     Rate and Rhythm: Normal rate.  Pulmonary:     Effort: Pulmonary effort is normal.  Neurological:     Mental Status: He is alert and oriented to person, place, and time.  Psychiatric:        Attention and Perception: Attention and perception normal.        Mood and Affect: Mood is anxious.        Speech: Speech normal.        Behavior: Behavior is cooperative.        Thought Content: Thought content normal.    Review of Systems  Constitutional:  Negative for chills and fever.       Pt reports chronic pain "nerve pain, sting all over, sandpaper grinding".  Respiratory:  Negative for shortness of breath.   Cardiovascular:  Negative for chest pain and palpitations.  Gastrointestinal:  Negative for abdominal pain.  Neurological:  Negative for dizziness and headaches.  Psychiatric/Behavioral:  The patient is nervous/anxious.    Blood pressure (!) 146/105, pulse 100, temperature 98.3 F (36.8 C), temperature source Oral, resp. rate 20, SpO2 96 %. There is no height or weight on file to calculate BMI.  Demographic Factors:  Male and Caucasian  Loss Factors: NA  Historical Factors: Prior suicide attempts  Risk Reduction Factors:   NA  Continued Clinical Symptoms:  Previous Psychiatric Diagnoses and Treatments  Cognitive Features That Contribute To Risk:  None    Suicide Risk:  Mild:  Suicidal ideation of limited frequency, intensity, duration, and specificity.  There are no identifiable plans, no associated intent, mild dysphoria and related symptoms, good self-control (both objective and subjective assessment), few other risk factors, and identifiable protective factors, including available and accessible social support.  Plan Of Care/Follow-up  recommendations:  Follow up with outpatient resources  Disposition:  Follow up with outpatient resources  Tharon Aquas, NP 11/29/2021, 10:07 AM

## 2021-11-29 NOTE — ED Notes (Signed)
Pt on call with TTS

## 2021-11-30 DIAGNOSIS — R7303 Prediabetes: Secondary | ICD-10-CM | POA: Diagnosis not present

## 2021-11-30 DIAGNOSIS — E782 Mixed hyperlipidemia: Secondary | ICD-10-CM | POA: Diagnosis not present

## 2021-11-30 DIAGNOSIS — R490 Dysphonia: Secondary | ICD-10-CM | POA: Diagnosis not present

## 2021-11-30 DIAGNOSIS — G8929 Other chronic pain: Secondary | ICD-10-CM | POA: Diagnosis not present

## 2021-12-04 ENCOUNTER — Telehealth (HOSPITAL_COMMUNITY): Payer: Self-pay | Admitting: *Deleted

## 2021-12-04 NOTE — Telephone Encounter (Signed)
PATIENT LVM STATED THAT HE  HAD BEEN IN THE HOSPITAL & THAT THEY  GAVE A SHOT OF ?? LITHIUM OR LIBRIUM (HARD TO UNDERSTAND BETWEEN COUGHING ATTACK). "HE STATED THAT HE FEELS THAT NONE OF MEDICAL PROVIDERS LISTENS TO HIM". "STATED THAT NO NONE BELIEVES HIM & DON'T LISTENS TO HIM" ASKED IF DR PARSONS WOULD CALL HIM

## 2021-12-05 NOTE — Telephone Encounter (Signed)
Patient informed Probation officer that he was given a shot of Librium when he was in the hospital.  Provider informed patient that he was not given Librium but instead Toradol.  He reports that he feels that Toradol is making his coughing attacks more severe.  Patient was on benzodiazepine for 48 years.  He believes he is suffering from residual symptoms however he has been off of benzodiazepine for over a year.  Patient notes that he would like to trial Flumazenil.  Provider informed patient that this is not a medication that she can fill.  Provider asked patient if he wanted to be referred to a pulmonologist and he notes that he did not.  Provider offered patient Ladona Ridgel to help manage coughing spells however he was not agreeable.  Patient does not want to increase current medication regimen or try anything else.  No other concerns at this time.

## 2021-12-11 ENCOUNTER — Telehealth (HOSPITAL_COMMUNITY): Payer: Self-pay

## 2021-12-11 NOTE — Telephone Encounter (Signed)
Medication management - Telephone call with patient to follow up on message he left requesting a call back.  Patient stated when he was recently in the hospital they put him on Librium, and he was having acute withdrawal symptoms like he stated he did in the past ativan.  Patient just wanted to let Dr. Ronne Binning know this information.  Reports he is doing better and no other issues at this time. Patient stated he just did not believe any of the symptoms he has been having with coughing for almost a year were not related to a past use of Librium that he states he thinks he took when in the hospital.

## 2021-12-13 MED ORDER — CHLORPROMAZINE HCL 25 MG PO TABS: 25.0000 mg | ORAL_TABLET | Freq: Three times a day (TID) | ORAL | 0 refills | Status: DC

## 2021-12-13 NOTE — Telephone Encounter (Signed)
Discussed with patient about trialing Thorazine for his chronic cough and he reports he will consider it.  Will send in a small trial of 5 doses and he will follow up with me in clinic.    Sent: -Thorazine 25 mg q8 PRN.  5 tablets with 0 refills.    Fatima Sanger MD Resident

## 2021-12-14 ENCOUNTER — Telehealth (HOSPITAL_COMMUNITY): Payer: Self-pay | Admitting: *Deleted

## 2021-12-14 NOTE — Telephone Encounter (Signed)
VM left on nurse line that he is trying to go to a pain clinic but the clinic wants to speak with his provider. I will forward this concern to his new provider, Dr Kai Levins to consider.

## 2021-12-16 ENCOUNTER — Other Ambulatory Visit (HOSPITAL_COMMUNITY): Payer: Self-pay | Admitting: Psychiatry

## 2021-12-25 ENCOUNTER — Telehealth (HOSPITAL_COMMUNITY): Payer: Self-pay | Admitting: *Deleted

## 2021-12-25 NOTE — Telephone Encounter (Signed)
Called patient.  He reports that he is has no SI or any thoughts of hurting himself.  He reports that his pain is his main issue.  He reports that his pain is in his face/checks, back, and feet.  He reports that he has been to a pain clinic in the past and the last time he was seen there he reports he was told the pain clinic doctor wanted to speak with his psychiatrist.  Discussed with him to call and make an appointment to see the pain clinic.  Discussed that if the doctor would like to speak to me to have him call the clinic and I would be more than happy to speak with him as he was giving me permission to.  He reports he plans to call to attempt to schedule an appointment with the pain clinic.    Fatima Sanger MD Resident

## 2021-12-25 NOTE — Telephone Encounter (Signed)
VM from patient stating he is in "excruciating pain" and it cant go on, no one is helping him and he would like a call from his provider. He was recently seen by Dr Kai Levins, will forward message to him for his response.

## 2021-12-28 ENCOUNTER — Ambulatory Visit (INDEPENDENT_AMBULATORY_CARE_PROVIDER_SITE_OTHER): Payer: Medicare Other | Admitting: Student in an Organized Health Care Education/Training Program

## 2021-12-28 VITALS — BP 107/94 | HR 105 | Ht 67.0 in | Wt 154.8 lb

## 2021-12-28 DIAGNOSIS — F411 Generalized anxiety disorder: Secondary | ICD-10-CM

## 2021-12-28 DIAGNOSIS — F22 Delusional disorders: Secondary | ICD-10-CM | POA: Diagnosis not present

## 2021-12-28 DIAGNOSIS — F331 Major depressive disorder, recurrent, moderate: Secondary | ICD-10-CM

## 2021-12-28 MED ORDER — PROPRANOLOL HCL 10 MG PO TABS: 10.0000 mg | ORAL_TABLET | Freq: Three times a day (TID) | ORAL | 3 refills | Status: DC

## 2021-12-28 MED ORDER — AMITRIPTYLINE HCL 10 MG PO TABS: 30.0000 mg | ORAL_TABLET | Freq: Every day | ORAL | 1 refills | Status: DC

## 2021-12-28 MED ORDER — HYDROXYZINE HCL 50 MG PO TABS
50.0000 mg | ORAL_TABLET | Freq: Three times a day (TID) | ORAL | 3 refills | Status: DC
Start: 2021-12-28 — End: 2023-03-31

## 2021-12-28 MED ORDER — AMITRIPTYLINE HCL 10 MG PO TABS: 20.0000 mg | ORAL_TABLET | Freq: Every day | ORAL | 1 refills | Status: DC

## 2021-12-28 NOTE — Progress Notes (Signed)
[facility] MD/PA/NP OP Progress Note  12/28/2021 12:51 PM [name]  MRN:  [id number]  Chief Complaint:  Chief Complaint  Patient presents with   Follow-up   Paranoid   Anxiety   HPI:  [name] is a 66 yr old male who presents for follow up and medication management.  PPHx is significant for Bipolar disorder, depression, anxiety, and panic disorder.   He reports continued frustration over not being heard over his past benzodiazepine use and continued withdrawal symptoms.  He reports multiple times that he would not have taken that medication from that doctor if he had known it was a benzodiazepine.  He reports that he has talked to 10 pharmacists and half of them say that it is benzo and it has made his symptoms worse.  When asked he reports he has not trialed the Thorazine because he states he has bad reactions to medications and does not want to risk things getting worse.  He reports he is contacted a pain clinic and is attempting to get his records sent to them.  He reports that the Elavil has been helpful with his anxiety and so asked for an increase.   Multiple times during the appointment he states he does not know if he can continue on like this, however, he states he will not harm himself.  He reports no SI, HI, or AVH.  He reports his appetite is poor due to his pain.  He reports his sleep is poor due to his pain.  He will return for follow-up in approximately 4 weeks.    Visit Diagnosis:    ICD-10-CM   1. Generalized anxiety disorder  F41.1 hydrOXYzine (ATARAX) 50 MG tablet    propranolol (INDERAL) 10 MG tablet    amitriptyline (ELAVIL) 10 MG tablet    DISCONTINUED: amitriptyline (ELAVIL) 10 MG tablet    2. Moderate episode of recurrent major depressive disorder (HCC)  F33.1     3. Paranoia ([place])  F22       Past Psychiatric History: Bipolar disorder, depression, anxiety, and panic disorder  Past Medical History:  Past Medical History:  Diagnosis Date   Alcohol  abuse    Allergy    Bipolar disorder ([place])    Chronic pain    lower back pain, "that has progressed"   Depression    Diverticulosis    Dyspnea    Hepatitis C    hx. of"states he no longer has"" is clear".   History of recreational drug use    "cocaine, marijuana, polysubstance" quit 1987.   HLD (hyperlipidemia)    Hypertension    Pancreatitis 2015   Panic attack    Sleep apnea    no cpap used,still has machine(use rarely)    Past Surgical History:  Procedure Laterality Date   CARDIAC CATHETERIZATION  2003   No CAD   EUS N/A 06/23/2014   Procedure: UPPER ENDOSCOPIC ULTRASOUND (EUS) LINEAR;  Surgeon: Milus Banister, MD;  Location: [facility];  Service: Endoscopy;  Laterality: N/A;   sinus surgery     TONSILLECTOMY      Family Psychiatric History: Unknown  Family History:  Family History  Problem Relation Age of Onset   Heart disease Mother    Kidney disease Mother    COPD Mother    Lupus Mother    Heart disease Father    Breast cancer Maternal Grandmother    Diabetes Maternal Grandfather     Social History:  Social History   Socioeconomic  History   Marital status: Divorced    Spouse name: Not on file   Number of children: 0   Years of education: GED   Highest education level: Not on file  Occupational History   Occupation: DISABLED   Occupation:    Tobacco Use   Smoking status: Former    Packs/day: 2.00    Years: 16.00    Total pack years: 32.00    Types: Cigarettes    Quit date: 04/08/2002    Years since quitting: 19.7   Smokeless tobacco: Never  Vaping Use   Vaping Use: Never used  Substance and Sexual Activity   Alcohol use: No    Alcohol/week: 0.0 standard drinks of alcohol    Comment: Quit in 1987   Drug use: Yes    Types: Marijuana   Sexual activity: Not Currently    Partners: Female  Other Topics Concern   Not on file  Social History Narrative   Patient lives at home alone.    Disabled.   Education GED    Both handed.   Caffeine  two cups of coffee daily.      Social Determinants of Health   Financial Resource Strain: Not on file  Food Insecurity: Not on file  Transportation Needs: Not on file  Physical Activity: Not on file  Stress: Not on file  Social Connections: Not on file    Allergies:  Allergies  Allergen Reactions   Morphine And Related Shortness Of Breath   Diazepam Other (See Comments)    Ringing in ears, diarrhea, CNS issues-multiple reactions    Gabapentin Other (See Comments)    "Gets worse"    Metabolic Disorder Labs: Lab Results  Component Value Date   HGBA1C 5.5 03/24/2020   MPG 111.15 03/24/2020   MPG 111 09/15/2012   No results found for: "PROLACTIN" Lab Results  Component Value Date   CHOL 170 03/24/2020   TRIG 44 03/24/2020   HDL 62 03/24/2020   CHOLHDL 2.7 03/24/2020   VLDL 9 03/24/2020   LDLCALC 99 03/24/2020   LDLCALC 74 08/16/2019   Lab Results  Component Value Date   TSH 2.434 11/28/2021   TSH 2.266 03/24/2020    Therapeutic Level Labs: No results found for: "LITHIUM" No results found for: "VALPROATE" No results found for: "CBMZ"  Current Medications: Current Outpatient Medications  Medication Sig Dispense Refill   amitriptyline (ELAVIL) 10 MG tablet Take 3 tablets (30 mg total) by mouth at bedtime. 90 tablet 1   chlorproMAZINE (THORAZINE) 25 MG tablet Take 1 tablet (25 mg total) by mouth 3 (three) times daily. 5 tablet 0   hydrOXYzine (ATARAX) 50 MG tablet Take 1 tablet (50 mg total) by mouth 3 (three) times daily. 90 tablet 3   propranolol (INDERAL) 10 MG tablet Take 1 tablet (10 mg total) by mouth 3 (three) times daily. 90 tablet 3   No current facility-administered medications for this visit.     Musculoskeletal: Strength & Muscle Tone: decreased Gait & Station: normal Patient leans: N/A  Psychiatric Specialty Exam: Review of Systems  Gastrointestinal:  Negative for nausea and vomiting.  Musculoskeletal:        Reports pain all over   Neurological:  Positive for headaches. Negative for weakness.  Psychiatric/Behavioral:  Positive for dysphoric mood and sleep disturbance. Negative for agitation, behavioral problems, hallucinations, self-injury and suicidal ideas. The patient is nervous/anxious.     Blood pressure (!) 107/94, pulse (!) 105, height '5\' 7"'$  (1.702 m), weight 154  lb 12.8 oz (70.2 kg), SpO2 94 %.Body mass index is 24.25 kg/m.  General Appearance: Casual and Disheveled  Eye Contact:  Fair  Speech:  Normal Rate and inturrupted by chronic cough  Volume:  Normal  Mood:  Anxious and Irritable  Affect:  Congruent and Constricted  Thought Process:  Coherent  Orientation:  Full (Time, Place, and Person)  Thought Content:  Perseveration over Benzos and pain    Suicidal Thoughts:  No  Homicidal Thoughts:  No  Memory:  Immediate;   Good  Judgement:  Poor  Insight:  Shallow  Psychomotor Activity:  Restlessness and grimacing in pain  Concentration:  Concentration: Fair and Attention Span: Fair  Recall:  [employer] of Knowledge: Fair  Language: Good  Akathisia:  Negative  Handed:  Right  AIMS (if indicated): not done  Assets:  Communication Skills Desire for Improvement Housing Leisure Time  ADL's:  Intact  Cognition: WNL  Sleep:  Poor   Screenings: GAD-7    Flowsheet Row Video Visit from 07/12/2020 in [place] from 04/19/2020 in [facility] at [facility]  Total GAD-7 Score 19 [place] Office Visit from 02/28/2014 in [facility]  Total Score (max 30 points ) 29      PHQ2-9    [place] ED from 11/28/2021 in [place] [facility] Counselor from 07/20/2020 in [facility] Video Visit from 07/12/2020 in [place] from 04/19/2020 in [place] at [facility] ED from 03/24/2020 in [facility]  PHQ-2 Total Score '2 6 4 3 6  '$ PHQ-9 Total Score '9 24 16 12 24      '$ [place] ED from 11/29/2021 in [facility] ED from 11/28/2021 in [place] [facility] ED from 08/23/2021 in [place] No Risk        Assessment and Plan:  [name] is a 66 yr old male who presents for follow up and medication management.  PPHx is significant for Bipolar disorder, depression, anxiety, and panic disorder.   [name] continues to perseverate on his symptoms from his Benzo use.  He has not trialed the Thorazine but continue to encourage him to as this could help with his cough and with his paranoia.  He would benefit from continued appointments as this could help him overcome his anxiety.  He continues to try to get into a Pain Clinic which would help relive more of his anxiety.  He is willing to increase his Elavil so we will do that.  He will return for follow up in approximately 4 weeks.   GAD  MDD  Illness Anxiety: -Increase Elavil to 30 mg QHS for anxiety.  90 (10 mg) tablets with 1 refill. -Encouraged him to trial Thorazine 25 mg for chronic cough and paranoia.  Had 5 tablets previously prescribed. -Continue Propanolol 10 mg TID for anxiety.  90 tablets with 1 refill. -Continue Hydroxyzine 50 mg q8 PRN for anxiety.  90 tablets with 3 refills.    Collaboration of Care:   Patient/Guardian was advised Release of Information must be obtained prior to any record release in order to collaborate their care with an outside provider. Patient/Guardian was advised if they have not already done so to contact the registration department to sign all necessary forms  in order for [place] to release information regarding their care.   Consent: Patient/Guardian gives verbal consent for treatment and assignment of benefits for services provided during this visit. Patient/Guardian  expressed understanding and agreed to proceed.    Briant Cedar, MD 12/28/2021, 12:51 PM

## 2022-01-03 ENCOUNTER — Telehealth (HOSPITAL_COMMUNITY): Payer: Self-pay | Admitting: *Deleted

## 2022-01-03 NOTE — Telephone Encounter (Signed)
[facility] Refill Request --- propranolol (INDERAL) 10 MG tablet Take 1 tablet (10 mg total) by mouth  3 (three) times daily

## 2022-01-04 ENCOUNTER — Other Ambulatory Visit (HOSPITAL_COMMUNITY): Payer: Self-pay | Admitting: Psychiatry

## 2022-01-04 DIAGNOSIS — F411 Generalized anxiety disorder: Secondary | ICD-10-CM

## 2022-01-04 MED ORDER — PROPRANOLOL HCL 10 MG PO TABS: 10.0000 mg | ORAL_TABLET | Freq: Three times a day (TID) | ORAL | 3 refills | Status: DC

## 2022-01-04 NOTE — Telephone Encounter (Signed)
Medication refilled and sent to preferred pharmacy

## 2022-01-08 ENCOUNTER — Encounter (HOSPITAL_COMMUNITY): Payer: Self-pay

## 2022-01-08 ENCOUNTER — Emergency Department (HOSPITAL_COMMUNITY)
Admission: EM | Admit: 2022-01-08 | Discharge: 2022-01-08 | Disposition: A | Discharge status: Home / Self Care | Payer: Medicare Other | Attending: Emergency Medicine | Admitting: Emergency Medicine

## 2022-01-08 ENCOUNTER — Ambulatory Visit (HOSPITAL_COMMUNITY)
Admission: RE | Admit: 2022-01-08 | Discharge: 2022-01-08 | Disposition: A | Discharge status: Home / Self Care | Payer: Medicare Other | Attending: Psychiatry | Admitting: Psychiatry

## 2022-01-08 DIAGNOSIS — Z79899 Other long term (current) drug therapy: Secondary | ICD-10-CM | POA: Insufficient documentation

## 2022-01-08 DIAGNOSIS — R1084 Generalized abdominal pain: Secondary | ICD-10-CM | POA: Diagnosis not present

## 2022-01-08 DIAGNOSIS — F959 Tic disorder, unspecified: Secondary | ICD-10-CM | POA: Diagnosis not present

## 2022-01-08 DIAGNOSIS — Z20822 Contact with and (suspected) exposure to covid-19: Secondary | ICD-10-CM | POA: Insufficient documentation

## 2022-01-08 DIAGNOSIS — R52 Pain, unspecified: Secondary | ICD-10-CM

## 2022-01-08 DIAGNOSIS — R519 Headache, unspecified: Secondary | ICD-10-CM | POA: Diagnosis not present

## 2022-01-08 LAB — CBC WITH DIFFERENTIAL/PLATELET
Abs Immature Granulocytes: 0.02 10*3/uL (ref 0.00–0.07)
Basophils Absolute: 0 10*3/uL (ref 0.0–0.1)
Basophils Relative: 0 %
Eosinophils Absolute: 0 10*3/uL (ref 0.0–0.5)
Eosinophils Relative: 0 %
HCT: 43.7 % (ref 39.0–52.0)
Hemoglobin: 14.7 g/dL (ref 13.0–17.0)
Immature Granulocytes: 0 %
Lymphocytes Relative: 20 %
Lymphs Abs: 2 10*3/uL (ref 0.7–4.0)
MCH: 26.4 pg (ref 26.0–34.0)
MCHC: 33.6 g/dL (ref 30.0–36.0)
MCV: 78.5 fL — ABNORMAL LOW (ref 80.0–100.0)
Monocytes Absolute: 0.9 10*3/uL (ref 0.1–1.0)
Monocytes Relative: 9 %
Neutro Abs: 6.7 10*3/uL (ref 1.7–7.7)
Neutrophils Relative %: 71 %
Platelets: 234 10*3/uL (ref 150–400)
RBC: 5.57 MIL/uL (ref 4.22–5.81)
RDW: 13.4 % (ref 11.5–15.5)
WBC: 9.6 10*3/uL (ref 4.0–10.5)
nRBC: 0 % (ref 0.0–0.2)

## 2022-01-08 LAB — COMPREHENSIVE METABOLIC PANEL
ALT: 19 U/L (ref 0–44)
AST: 21 U/L (ref 15–41)
Albumin: 4.5 g/dL (ref 3.5–5.0)
Alkaline Phosphatase: 81 U/L (ref 38–126)
Anion gap: 7 (ref 5–15)
BUN: 18 mg/dL (ref 8–23)
CO2: 22 mmol/L (ref 22–32)
Calcium: 9.4 mg/dL (ref 8.9–10.3)
Chloride: 109 mmol/L (ref 98–111)
Creatinine, Ser: 0.91 mg/dL (ref 0.61–1.24)
GFR, Estimated: >60 mL/min (ref >60)
Glucose, Bld: 105 mg/dL — ABNORMAL HIGH (ref 70–99)
Potassium: 3.8 mmol/L (ref 3.5–5.1)
Sodium: 138 mmol/L (ref 135–145)
Total Bilirubin: 0.6 mg/dL (ref 0.3–1.2)
Total Protein: 7.5 g/dL (ref 6.5–8.1)

## 2022-01-08 LAB — SALICYLATE LEVEL: Salicylate Lvl: <7 mg/dL — ABNORMAL LOW (ref 7.0–30.0)

## 2022-01-08 LAB — RESP PANEL BY RT-PCR (FLU A&B, COVID) ARPGX2
Influenza A by PCR: NEGATIVE
Influenza B by PCR: NEGATIVE
SARS Coronavirus 2 by RT PCR: NEGATIVE

## 2022-01-08 LAB — ETHANOL: Alcohol, Ethyl (B): <10 mg/dL (ref <10)

## 2022-01-08 LAB — ACETAMINOPHEN LEVEL: Acetaminophen (Tylenol), Serum: <10 ug/mL — ABNORMAL LOW (ref 10–30)

## 2022-01-08 LAB — CK: Total CK: 98 U/L (ref 49–397)

## 2022-01-08 MED ORDER — KETOROLAC TROMETHAMINE 30 MG/ML IJ SOLN
30.0000 mg | Freq: Once | INTRAMUSCULAR | Status: DC
  Filled 2022-01-08: qty 1

## 2022-01-08 MED ORDER — SODIUM CHLORIDE 0.9 % IV BOLUS
1000.0000 mL | Freq: Once | INTRAVENOUS | Status: AC
  Administered 2022-01-08: 1000 mL via INTRAVENOUS

## 2022-01-08 MED ORDER — LIDOCAINE VISCOUS HCL 2 % MT SOLN
15.0000 mL | Freq: Once | OROMUCOSAL | Status: DC
  Filled 2022-01-08: qty 15

## 2022-01-08 NOTE — ED Provider Notes (Signed)
[place] DEPT Provider Note   CSN: [id number] Arrival date & time: 01/08/22  1451     History  Chief Complaint  Patient presents with   Headache    [name] is a 66 y.o. male who presents emergency department with multiple complaints.  The patient is extremely tangential and anxious it is difficult to ascertain what his complaint is.  The patient does complain that his face throat chest and head are hurting.  This has been ongoing for months.  Patient has a constant throat clearing cough and winces with his face.  He is fixated on a history of benzodiazepine withdrawal and believes that he is still in withdrawal even though he has not had a benzo type diazepam in over a year and a half.  Patient does not use any alcohol or drugs anymore.  He apparently is on Thorazine.   Headache      Home Medications Prior to Admission medications   Medication Sig Start Date End Date Taking? Authorizing Provider  amitriptyline (ELAVIL) 10 MG tablet Take 3 tablets (30 mg total) by mouth at bedtime. 12/28/21   Briant Cedar, MD  chlorproMAZINE (THORAZINE) 25 MG tablet Take 1 tablet (25 mg total) by mouth 3 (three) times daily. 12/13/21   Briant Cedar, MD  hydrOXYzine (ATARAX) 50 MG tablet Take 1 tablet (50 mg total) by mouth 3 (three) times daily. 12/28/21   Briant Cedar, MD  propranolol (INDERAL) 10 MG tablet Take 1 tablet (10 mg total) by mouth 3 (three) times daily. 01/04/22   Salley Slaughter, NP      Allergies    Morphine and related, Diazepam, and Gabapentin    Review of Systems   Review of Systems  Neurological:  Positive for headaches.    Physical Exam Updated Vital Signs BP (!) 138/109 (BP Location: Left Arm)   Pulse 90   Temp 98.7 F (37.1 C) (Oral)   Resp 18   SpO2 100%  Physical Exam Vitals and nursing note reviewed.  Constitutional:      General: He is not in acute distress.    Appearance: He is well-developed.  He is not diaphoretic.     Comments: Patient is constantly coughing and wincing with his face during examination  HENT:     Head: Normocephalic and atraumatic.  Eyes:     General: No scleral icterus.    Conjunctiva/sclera: Conjunctivae normal.  Cardiovascular:     Rate and Rhythm: Normal rate and regular rhythm.     Heart sounds: Normal heart sounds.  Pulmonary:     Effort: Pulmonary effort is normal. No respiratory distress.     Breath sounds: Normal breath sounds.  Abdominal:     Palpations: Abdomen is soft.     Tenderness: There is no abdominal tenderness.  Musculoskeletal:     Cervical back: Normal range of motion and neck supple.  Skin:    General: Skin is warm and dry.  Neurological:     Mental Status: He is alert and oriented to person, place, and time.     Cranial Nerves: No cranial nerve deficit or dysarthria.     Sensory: No sensory deficit.  Psychiatric:        Mood and Affect: Mood is anxious.        Speech: Speech is rapid and pressured and tangential.        Behavior: Behavior is agitated.     ED Results / Procedures / Treatments  Labs (all labs ordered are listed, but only abnormal results are displayed) Labs Reviewed  COMPREHENSIVE METABOLIC PANEL - Abnormal; Notable for the following components:      Result Value   Glucose, Bld 105 (*)    All other components within normal limits  CBC WITH DIFFERENTIAL/PLATELET - Abnormal; Notable for the following components:   MCV 78.5 (*)    All other components within normal limits  ACETAMINOPHEN LEVEL - Abnormal; Notable for the following components:   Acetaminophen (Tylenol), Serum <10 (*)    All other components within normal limits  SALICYLATE LEVEL - Abnormal; Notable for the following components:   Salicylate Lvl <5.5 (*)    All other components within normal limits  RESP PANEL BY RT-PCR (FLU A&B, COVID) ARPGX2  ETHANOL  CK  RAPID URINE DRUG SCREEN, HOSP PERFORMED    EKG None  Radiology No results  found.  Procedures Procedures    Medications Ordered in ED Medications  lidocaine (XYLOCAINE) 2 % viscous mouth solution 15 mL (15 mLs Mouth/Throat Patient Refused/Not Given 01/08/22 2159)  ketorolac (TORADOL) 30 MG/ML injection 30 mg (30 mg Intravenous Patient Refused/Not Given 01/08/22 2159)  sodium chloride 0.9 % bolus 1,000 mL (0 mLs Intravenous Stopped 01/08/22 2235)    ED Course/ Medical Decision Making/ A&P                           Medical Decision Making Patient persistently asking me to read an article on his phone that would explain what medications he should or should not have based on something called the [facility] document.  Assured the patient that I was going to offer him Toradol and discussed lidocaine.  Patient refused these medications.  He was unable to find the document and was refusing to take any medicines until I read it.  He did get some fluids.  His heart rate has improved significantly.  Differential diagnosis includes neurological tic, dystonia from psych meds, alcohol withdrawal. Patient became extremely upset when he was unable to find this document and did not want any further evaluation.  His vital signs have normalized.  I think he can follow-up with his primary care physician.  Appropriate for discharge at this time.  The patient appears reasonably screened and/or stabilized for discharge and I doubt any other medical condition or other [facility] requiring further screening, evaluation, or treatment in the ED at this time prior to discharge.   Amount and/or Complexity of Data Reviewed Labs: ordered.  Risk Prescription drug management.          Final Clinical Impression(s) / ED Diagnoses Final diagnoses:  Generalized pain  Tic disorder    Rx / DC Orders ED Discharge Orders          Ordered    TSH        01/08/22 2048              Margarita Mail, PA-C 01/08/22 2310    Leanord Asal K, DO 01/08/22 2354

## 2022-01-08 NOTE — ED Triage Notes (Signed)
Pt visibly anxious during triage. States that he has been having chronic head/face pain ongoing for quite some time. Pt states that he believes the pain is related to benzodiazepine withdrawal. Pt states he has been off of benzos for several years. Recent visit to another ED and was given Lithium which he states is a benzo and therefore is causing his symptoms.

## 2022-01-08 NOTE — H&P (Addendum)
Behavioral Health Medical Screening Exam  HPI: [name] is a 66 y.o. Caucasian male who presents voluntarily as a walk-in to [facility] for complaint of severe generalized cramping pain all over his body from possible withdrawal from Valium which he has taken for 48 years.  Patient unable to report when last he took Valium or if he is currently taking Valium.  Patient has past psychiatric history of anxiety, bipolar disorder, depression, generalized anxiety disorder, major depressive disorder recurrent episode, memory problem, paranoia, and multiple medical problems please see problem list.  Assessment: Patient is seen face-to-face and examined in the screen room.  Appears very agitated and wrinkling from head to toe due to severe generalized cramping pain.  Chart reviewed and findings shared with the treatment team and discussed with Dr. Dwyane Dee.  Able to maintain fair eye contact with the provider.  Speech difficult to understand due to pain.  Presents with anxious and dysphoric mood with congruent and tearful affect.  Thought process linear with thought content with rumination.  Sensorium with memory, judgment, and insight fair.  Patient reports that the pain is so severe that he can take his life.  However, stated he does not want to kill himself.  Denies homicidal ideation, paranoia, delusions, or auditory/visual hallucinations.  Denies self-injurious behavior, denies alcohol use, denies drug use except for Valium, and denies tobacco smoking.  Endorses being followed by a therapist and psychiatrist from [facility] on [address].  Denies family history of mental illness, and denies access to firearms.  Reports history of very high anxiety and refused to rate on a scale of 0-10.  Reports symptoms of depression and characterized as self isolation due to his pain, crying spells, irritability, hopelessness, worthlessness, guilt, poor concentration and anhedonia.  Reports he has  been to ED several times, however the physician has been "throwing him out."  Made patient aware that he needs to be medically cleared before he could go anywhere else due to his elevated vital signs and severe generalized body pain.  Of note, patient states that he does not take Neurontin, Valium, Librium, and Neurontin due to this medication messing up with his system.  However this is very confusing since patient states that he has been using Valium for the past 48 years.  Disposition: [place] [facility] ED physician called and made aware of patient needing medical clearance.  Safe transport called to transfer patient to [facility] long emergency room for medical clearance.  Patient made aware and was transferred safely from [facility].  Total Time spent with patient: 1 hour  Psychiatric Specialty Exam:  Presentation  General Appearance:  Bizarre; Fairly Groomed  Eye Contact: Fair  Speech: Clear and Coherent  Speech Volume: Normal  Handedness: Ambidextrous  Mood and Affect  Mood: Anxious; Dysphoric  Affect: Congruent; Tearful  Thought Process  Thought Processes: Coherent; Linear  Descriptions of Associations:Intact  Orientation:Full (Time, Place and Person)  Thought Content:Logical; Rumination  History of Schizophrenia/Schizoaffective disorder:No  Duration of Psychotic Symptoms:No data recorded Hallucinations:Hallucinations: None  Ideas of Reference:None  Suicidal Thoughts:Suicidal Thoughts: Yes, Passive (Due to pain from withdrawal from Valium) SI Passive Intent and/or Plan: Without Plan; Without Intent  Homicidal Thoughts:Homicidal Thoughts: No  Sensorium  Memory: Immediate Fair; Recent Fair; Remote Fair  Judgment: Fair  Insight: Fair  Materials engineer: Fair  Attention Span: Fair  Recall: [employer] of Knowledge: Fair  Language: Fair  Psychomotor Activity  Psychomotor Activity: Psychomotor Activity: Restlessness; Mannerisms;  Increased  Assets  Assets: Communication Skills  Sleep  Sleep: Sleep: Good Number of Hours of Sleep: 5  Physical Exam: Physical Exam HENT:     Head: Normocephalic.     Right Ear: External ear normal.     Left Ear: External ear normal.     Nose: Nose normal.     Mouth/Throat:     Mouth: Mucous membranes are moist.     Pharynx: Oropharynx is clear.  Eyes:     Extraocular Movements: Extraocular movements intact.     Conjunctiva/sclera: Conjunctivae normal.     Pupils: Pupils are equal, round, and reactive to light.  Cardiovascular:     Rate and Rhythm: Tachycardia present.     Comments: Blood pressure 123/98, pulse 124.  Vital signs to be rechecked by nursing staff. Pulmonary:     Effort: Pulmonary effort is normal.  Abdominal:     Palpations: Abdomen is soft.  Genitourinary:    Comments: Deferred Musculoskeletal:        General: Normal range of motion.     Cervical back: Normal range of motion.  Skin:    General: Skin is warm.  Neurological:     General: No focal deficit present.     Mental Status: He is alert and oriented to person, place, and time.  Psychiatric:     Comments: Restless and agitated    Review of Systems  Constitutional: Negative.  Negative for chills and fever.  HENT: Negative.  Negative for hearing loss and tinnitus.   Eyes: Negative.  Negative for blurred vision and double vision.  Respiratory:  Positive for cough (Chronic cough). Negative for sputum production, shortness of breath and wheezing.   Cardiovascular: Negative.  Negative for chest pain and palpitations.       Blood pressure 123/98, pulse 124.  VS to be rechecked by nursing staff.  Gastrointestinal: Negative.  Negative for heartburn and nausea.  Genitourinary: Negative.  Negative for dysuria and urgency.  Musculoskeletal: Negative.        Whole body pain especially face eyes  Skin: Negative.  Negative for itching and rash.  Neurological: Negative.  Negative for dizziness and  headaches.  Endo/Heme/Allergies: Negative.  Negative for environmental allergies and polydipsia. Does not bruise/bleed easily.         Morphine And Related Morphine And Related  Shortness Of Breath High Allergy 03/09/2013 Deletion Reason:  Gabapentin Gabapentin  Other (See Comments) Not Specified  11/29/2021 "Gets worse" Deletion Reason:  Adverse Reactions/Drug Intolerances   Diazepam Diazepam  Other (See Comments) Not Specified Intolerance 04/19/2020 Ringing in ears, diarrhea, CNS issues-multiple reactions     Psychiatric/Behavioral:  Positive for substance abuse. The patient is nervous/anxious.    Blood pressure (!) 123/98, pulse (!) 124, temperature 98.5 F (36.9 C), temperature source Oral, resp. rate 20, SpO2 95 %. There is no height or weight on file to calculate BMI.  Musculoskeletal: Strength & Muscle Tone: within normal limits Gait & Station: normal Patient leans: N/A  [place] Scale:  [place] ED from 11/29/2021 in [facility] ED from 11/28/2021 in [place] [facility] ED from 08/23/2021 in [place] DEPT  C-SSRS RISK CATEGORY Low Risk Low Risk No Risk       Recommendations:  Based on my evaluation the patient appears to have an emergency medical condition for which I recommend the patient be transferred to the emergency department for further evaluation.  Laretta Bolster, FNP 01/08/2022, 2:07 PM

## 2022-01-08 NOTE — Discharge Instructions (Signed)
Get help right away if you: Have severe pain. Have trouble breathing. Lose consciousness. Have chest pain or pressure that lasts for more than a few minutes, or if you have other symptoms along with chest pain, including if you: Have pain or discomfort in one or both arms, your back, neck, jaw, or stomach. Have shortness of breath. Break out in a cold sweat. Feel nauseous. Become light-headed.

## 2022-01-08 NOTE — ED Provider Triage Note (Signed)
Emergency Medicine Provider Triage Evaluation Note  [name] , a 66 y.o. male  was evaluated in triage.  Pt complains of full body pain.  Patient has been ongoing patient is complaining of facial pain, pain across his chest, pain in his back, pain in his legs.  He says he had this for several years, however it Is gotten worse since he was recent recently discharged from our ED.  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  He states that he was given Ativan during his last time being seen as well as a dose of Librium.  He says his symptoms have gotten markedly worse since then and he is concerned that he is withdrawing from these medications.  Review of Systems  Positive:  Negative:   Physical Exam  BP (!) 134/107 (BP Location: Left Arm)   Pulse (!) 130   Temp 98 F (36.7 C) (Oral)   Resp 18   SpO2 95%  Gen:   Awake, no distress   Resp:  Normal effort  MSK:   Moves extremities without difficulty  Other:  Very anxious  Medical Decision Making  Medically screening exam initiated at 3:45 PM.  Appropriate orders placed.  [name] was informed that the remainder of the evaluation will be completed by another provider, this initial triage assessment does not replace that evaluation, and the importance of remaining in the ED until their evaluation is complete.     [name], [place] 01/08/22 1546

## 2022-01-09 ENCOUNTER — Telehealth (HOSPITAL_COMMUNITY): Payer: Self-pay | Admitting: *Deleted

## 2022-01-09 DIAGNOSIS — G894 Chronic pain syndrome: Secondary | ICD-10-CM | POA: Diagnosis not present

## 2022-01-09 NOTE — Telephone Encounter (Signed)
Second form from [facility] faxed here for Dr to send in inderal and elavil rx thru [facility] for home delivery. He has current rxs but sent into [facility]. Will forward request to Dr Kai Levins to consider this request.

## 2022-01-11 ENCOUNTER — Ambulatory Visit (HOSPITAL_COMMUNITY)
Admission: AD | Admit: 2022-01-11 | Discharge: 2022-01-11 | Disposition: A | Discharge status: Home / Self Care | Payer: Medicare Other | Attending: Psychiatry | Admitting: Psychiatry

## 2022-01-11 ENCOUNTER — Emergency Department (HOSPITAL_COMMUNITY)
Admission: EM | Admit: 2022-01-11 | Discharge: 2022-01-11 | Disposition: A | Discharge status: Home / Self Care | Payer: Medicare Other | Attending: Emergency Medicine | Admitting: Emergency Medicine

## 2022-01-11 ENCOUNTER — Other Ambulatory Visit: Payer: Self-pay

## 2022-01-11 ENCOUNTER — Ambulatory Visit (HOSPITAL_COMMUNITY)
Admission: RE | Admit: 2022-01-11 | Discharge: 2022-01-11 | Disposition: A | Discharge status: Home / Self Care | Payer: Medicare Other | Attending: Psychiatry | Admitting: Psychiatry

## 2022-01-11 DIAGNOSIS — I10 Essential (primary) hypertension: Secondary | ICD-10-CM | POA: Diagnosis not present

## 2022-01-11 DIAGNOSIS — F419 Anxiety disorder, unspecified: Secondary | ICD-10-CM | POA: Insufficient documentation

## 2022-01-11 DIAGNOSIS — R6889 Other general symptoms and signs: Secondary | ICD-10-CM | POA: Diagnosis not present

## 2022-01-11 DIAGNOSIS — I499 Cardiac arrhythmia, unspecified: Secondary | ICD-10-CM | POA: Diagnosis not present

## 2022-01-11 LAB — BASIC METABOLIC PANEL
Anion gap: 16 — ABNORMAL HIGH (ref 5–15)
BUN: 15 mg/dL (ref 8–23)
CO2: 20 mmol/L — ABNORMAL LOW (ref 22–32)
Calcium: 10 mg/dL (ref 8.9–10.3)
Chloride: 104 mmol/L (ref 98–111)
Creatinine, Ser: 1.04 mg/dL (ref 0.61–1.24)
GFR, Estimated: >60 mL/min (ref >60)
Glucose, Bld: 109 mg/dL — ABNORMAL HIGH (ref 70–99)
Potassium: 4 mmol/L (ref 3.5–5.1)
Sodium: 140 mmol/L (ref 135–145)

## 2022-01-11 LAB — CBC
HCT: 46 % (ref 39.0–52.0)
Hemoglobin: 15.1 g/dL (ref 13.0–17.0)
MCH: 26.1 pg (ref 26.0–34.0)
MCHC: 32.8 g/dL (ref 30.0–36.0)
MCV: 79.6 fL — ABNORMAL LOW (ref 80.0–100.0)
Platelets: 269 10*3/uL (ref 150–400)
RBC: 5.78 MIL/uL (ref 4.22–5.81)
RDW: 13.5 % (ref 11.5–15.5)
WBC: 7.4 10*3/uL (ref 4.0–10.5)
nRBC: 0 % (ref 0.0–0.2)

## 2022-01-11 MED ORDER — PROPRANOLOL HCL 10 MG PO TABS
10.0000 mg | ORAL_TABLET | Freq: Three times a day (TID) | ORAL | Status: DC
  Filled 2022-01-11 (×2): qty 1

## 2022-01-11 MED ORDER — HYDROXYZINE HCL 25 MG PO TABS: 50.0000 mg | ORAL_TABLET | Freq: Three times a day (TID) | ORAL | Status: DC

## 2022-01-11 NOTE — ED Provider Notes (Signed)
[facility] EMERGENCY DEPARTMENT Provider Note   CSN: [id number] Arrival date & time: 01/11/22  1232     History  Chief Complaint  Patient presents with   Anxiety    [name] is a 66 y.o. male.  HPI Adult male presents on transfer from our behavior health urgent care center due to concern for tachycardia, hypertension.  Patient is awake, alert, denies new complaints, stated that he chronically feels anxious, shaky, attributes this to long-term use of benzodiazepine, and ongoing withdrawal.  He acknowledges prior suicidal ideation, denies any currently.  No focal pain.  He is unsure of why he was transferred here for evaluation.  After my initial evaluation I discussed this case with his nurse practitioner from our facility, she notes that the patient was transferred due to concern for tachycardia, shakiness.    Home Medications Prior to Admission medications   Medication Sig Start Date End Date Taking? Authorizing Provider  amitriptyline (ELAVIL) 10 MG tablet Take 3 tablets (30 mg total) by mouth at bedtime. 12/28/21   Briant Cedar, MD  chlorproMAZINE (THORAZINE) 25 MG tablet Take 1 tablet (25 mg total) by mouth 3 (three) times daily. 12/13/21   Briant Cedar, MD  hydrOXYzine (ATARAX) 50 MG tablet Take 1 tablet (50 mg total) by mouth 3 (three) times daily. 12/28/21   Briant Cedar, MD  propranolol (INDERAL) 10 MG tablet Take 1 tablet (10 mg total) by mouth 3 (three) times daily. 01/04/22   Salley Slaughter, NP      Allergies    Morphine and related, Diazepam, and Gabapentin    Review of Systems   Review of Systems  Respiratory:  Positive for cough (chronic).   Psychiatric/Behavioral:  The patient is nervous/anxious.   All other systems reviewed and are negative.   Physical Exam Updated Vital Signs BP (!) 138/110   Pulse 95   Temp 98.6 F (37 C)   Resp (!) 22   Ht '5\' 7"'$  (1.702 m)   Wt 68 kg   SpO2 97%   BMI 23.49 kg/m   Physical Exam Vitals and nursing note reviewed.  Constitutional:      General: He is not in acute distress.    Appearance: He is well-developed.     Comments: Anxious adult male awake and alert sitting upright speaking clearly.  HENT:     Head: Normocephalic and atraumatic.  Eyes:     Conjunctiva/sclera: Conjunctivae normal.  Cardiovascular:     Rate and Rhythm: Normal rate and regular rhythm.  Pulmonary:     Effort: Pulmonary effort is normal. No respiratory distress.     Breath sounds: No stridor.  Abdominal:     General: There is no distension.  Skin:    General: Skin is warm and dry.  Neurological:     Mental Status: He is alert and oriented to person, place, and time.  Psychiatric:     Comments: Patient offers insight into his history of anxiousness, prior benzodiazepine and other substance abuse.  He notes that he is not currently using any of these medications, denies any thoughts of self-harm.     ED Results / Procedures / Treatments   Labs (all labs ordered are listed, but only abnormal results are displayed) Labs Reviewed  BASIC METABOLIC PANEL - Abnormal; Notable for the following components:      Result Value   CO2 20 (*)    Glucose, Bld 109 (*)    Anion gap 16 (*)  All other components within normal limits  CBC - Abnormal; Notable for the following components:   MCV 79.6 (*)    All other components within normal limits    EKG None  Radiology No results found.  Procedures Procedures    Medications Ordered in ED Medications  propranolol (INDERAL) tablet 10 mg (has no administration in time range)  hydrOXYzine (ATARAX) tablet 50 mg (has no administration in time range)    ED Course/ Medical Decision Making/ A&P                           Medical Decision Making Adult male with history of anxiety, benzodiazepine use, prior substance abuse presents with shakiness, is tachycardic and hypertensive, but no more so than he was 3 weeks ago, alert  during prior evaluations.  Some suspicion for the patient's inconsistent use of propanolol, contributing to his tachycardia, hypertension as there is no evidence for distress, no actual physical complaints that are new, no evidence for acute new pathology.  Patient comfortable with outpatient follow-up with primary care and behavioral health services.  He was offered while here, declined.  Amount and/or Complexity of Data Reviewed Independent Historian:     Details: Nurse practitioner at behavioral health External Data Reviewed: notes.    Details: As above vital signs similar with multiple prior evaluations, as is the patient's story today. Labs:  Decision-making details documented in ED Course.    Details: Labs unremarkable, no evidence for hypertensive endorgan damage  Risk Prescription drug management.   Final Clinical Impression(s) / ED Diagnoses Final diagnoses:  Anxiousness     Carmin Muskrat, MD 01/11/22 1444

## 2022-01-11 NOTE — Discharge Instructions (Signed)
It is important to take all of your recently prescribed medication as directed, including your propanolol.  Even brief pauses of this medication may result in rebound tachycardia, or a return to a fast heart rate.  Follow up with our behavioral health colleagues, and your primary care physician.

## 2022-01-11 NOTE — ED Notes (Signed)
Patient verbalizes understanding of discharge instructions. Opportunity for questioning and answers were provided. Pt discharged from ED. 

## 2022-01-11 NOTE — H&P (Addendum)
Behavioral Health Medical Screening Exam  [name] is an 66 y.o. male who was seen a few hours prior and sent to [facility] for medical clearance due to c/o chest pain, elevated bp, and neuro-cognitive changes. He reports "they refused to work me up so I told them I could get better sitting at home. The doctor read my article and agreed with what I'm saying so if yall would read it then".   Patient remains perseverative on internet article describing benzodiazepine withdrawal that he states initiated his presenting symptoms. He continues to have some involuntary movements but not to the extent of prior presentation. Provider discussed EPS and patient refuted then stated "I read on the internet I probably have akathisia"; provider attempted to explain that akathisia is a form of EPS and his current prescription for Propanolol would help with symptoms. Provider attempted to explain effects of Kratom use as patient endorses continued use "for pain". Patient remains oppositional and continued to pull out his cellphone demanding provider read article. When asked reason for presenting to [facility] patient responded, "I came to talk to somebody about what's going on and to show them this article". He denies any suicidal or homicidal ideations, auditory or visual hallucinations. Patient provided verbal permission to speak to brother for collateral information.   Updated: After finishing call with brother patient was found in parking lot leaving. Provider presented patient with proposed plan of overnight observation to begin treatment for EPS symptoms; patient became increasingly agitated stating, "if you don't want to read my article I don't want your treatment". Provider attempted to explain EPS and treatment including non-opioids or benzodiazepines. Patient remains fixated on internet articles, continues to refuse treatment, and left parking lot.   Collateral: Renee Pain (brother) [phone] Recently moved back from  [place]. There have been several things he talked about in regards to the medications he had been prescribed. He had this cough and had socks on his feet where he talked about losing feeling in his feet. Brought up multiple things about doctors not listening to him. He was expressing concerns over side effects the medicines were known to cause. Knows patient spends a lot of time on the computer. On last conversation he asked for him to contact doctors to discuss information from the articles. Denies concerns that patient would try to harm himself; explained Grandfather did complete suicide. Provider discussed believed presentation and proposed treatment; brother expressed interest in being supportive to brother's treatment for resolution of symptoms. Denies concern for psychosis or safety issues.   1701: Called to notify that patient refused treatment and left. Discussed family contacting patient to discuss treatment.   1830: Brother called back and states family discussed concerns and treatments. Say patient verbalized acceptance that symptoms are a result of EPS and plan for family to bring patient in for treatment now that he is agreeable. Continues to deny any concerns for safety and doesn't feel patient is psychotic or an imminent threat to himself or others.   Total Time spent with patient: 45 minutes  Psychiatric Specialty Exam: Physical Exam Vitals and nursing note reviewed.  Constitutional:      Appearance: He is normal weight.  HENT:     Head: Normocephalic.     Nose: Nose normal.     Mouth/Throat:     Mouth: Mucous membranes are dry.     Comments: Constant cough-like tic possibly EPS Eyes:     Comments: Fixed pupils  Cardiovascular:     Rate and  Rhythm: Normal rate.     Pulses: Normal pulses.  Pulmonary:     Effort: Pulmonary effort is normal.     Breath sounds: Normal breath sounds.  Abdominal:     General: Abdomen is flat.  Musculoskeletal:     Cervical back: Normal  range of motion.     Comments: EPS noted  Neurological:     Mental Status: He is alert.     Coordination: Coordination abnormal.  Psychiatric:        Attention and Perception: He does not perceive auditory or visual hallucinations.        Mood and Affect: Affect is labile.        Speech: Speech is tangential.        Behavior: Behavior is uncooperative.        Thought Content: Thought content does not include homicidal or suicidal plan.        Judgment: Judgment is inappropriate.    Review of Systems  Constitutional:  Positive for activity change.  Psychiatric/Behavioral:  Positive for agitation, decreased concentration, dysphoric mood and sleep disturbance. The patient is nervous/anxious and is hyperactive.   All other systems reviewed and are negative.  There were no vitals taken for this visit.There is no height or weight on file to calculate BMI. General Appearance: Casual Eye Contact:  Fair Speech:   less pressured; involuntary grunts, cough Volume:  Normal Mood:  Irritable Affect:  Non-Congruent and Labile Thought Process:  Disorganized and Descriptions of Associations: Tangential Orientation:  Full (Time, Place, and Person) Thought Content:  Illogical and Rumination Suicidal Thoughts:   unclear; patient made statements then denied SI Homicidal Thoughts:  No Memory:  Immediate;   Poor Recent;   Poor Judgement:  Poor Insight:  Shallow Psychomotor Activity:  EPS and Restlessness Concentration: Concentration: Fair and Attention Span: Fair Recall:  [place]: Fair Akathisia:  Yes Handed:  Right AIMS (if indicated):    Assets:  Housing Resilience Social Support Sleep:     Musculoskeletal: Strength & Muscle Tone: increased Gait & Station: broad based Patient leans: Backward  There were no vitals taken for this visit.  [place] Scale:  [facility] ED from 01/11/2022 in [facility] Most recent  reading at 01/11/2022 12:47 PM OP Visit from 01/11/2022 in [facility] Most recent reading at 01/11/2022 11:05 AM ED from 01/08/2022 in [facility] DEPT Most recent reading at 01/08/2022  3:28 PM  C-SSRS RISK CATEGORY Low Risk Low Risk No Risk       Recommendations: Based on my evaluation the patient does not appear to have an emergency medical condition. Provider met with patient in parking lot to discuss overnight observation and begin treatment for EPS symptoms. Patient adamantly declined and drove away.   Inda Merlin, NP 01/11/2022, 4:11 PM

## 2022-01-11 NOTE — H&P (Addendum)
Behavioral Health Medical Screening Exam  [name] is an 66 y.o. male with past psychiatric history of major depressive disorder recurrent episode, bipolar disorder, anxiety, hx of alcohol abuse, memory problem, paranoia who presented to [facility] for the second time this week for assessment of suicidal thoughts, anxiety, anhedonia, irritability, feelings of guilt and worthlessness. Patient reported to [facility] he was having pain in his chest, back, throat, face and feet; complaints noted on registration sheet as well. Blood pressure noted to be elevated.   He presents with notable involuntary movements of face and extremities, persistent cough with tic-like presentation, and intermittent vocal noises. He reports "no one is listening" to him regarding his physical symptoms and feels his time is running out. He has a blunt affect and becomes easily agitated during assessment. Provider inquired about presentation including significantly elevated blood pressure in which he attributes to Valium withdrawal from 3 years ago that he refers to [employer]. He reports last drink >1 year ago and denies any illicit substance use. He is perseverative on a "doctor" he found on [employer] who reports on "long" Valium withdrawal effects that he feels has caused his physical symptoms. He becomes visibly irritated when provider mentions the needs for medical clearance and him being transferred to the hospital stating he was sent to the hospital "a few days ago and they sent me back home thinking I'm a drug addict or something. Nobody wants to believe me". Provider attempted to explain the concerns given his physical presentation patient did become visibly agitated insisting the provider search [employer] for "the number one doctor on Valium discontinuation symptoms". Provider redirected conversation to concerns regarding physical presentation and hypertension, further explained the need to get medical clearance and psychiatry having the ability  to follow up while in the ED. Provider reassured him that we do not believe he is a drug addict and would like to provide him the best care which included medically stabilizing him first.   Total Time spent with patient: 30 minutes  Psychiatric Specialty Exam: Physical Exam Vitals and nursing note reviewed.  Constitutional:      General: He is in acute distress.     Appearance: He is ill-appearing.  HENT:     Head: Atraumatic.     Nose: Nose normal.     Mouth/Throat:     Comments: Persistent cough/tic Cardiovascular:     Rate and Rhythm: Tachycardia present.  Skin:    General: Skin is dry.  Neurological:     Mental Status: He is alert.     Coordination: Coordination abnormal.  Psychiatric:        Attention and Perception: He is inattentive.        Mood and Affect: Affect is blunt and inappropriate.        Speech: Speech is rapid and pressured.        Thought Content: Thought content does not include homicidal or suicidal plan.        Judgment: Judgment is impulsive and inappropriate.     Comments: Persistent cough/tic noted    Review of Systems  Constitutional:  Positive for activity change, appetite change and unexpected weight change.  Neurological:  Positive for speech difficulty.       Swallowing difficulty  Psychiatric/Behavioral:  Positive for agitation, decreased concentration, dysphoric mood and sleep disturbance.   All other systems reviewed and are negative.  Blood pressure (!) 149/124, pulse (!) 125, temperature 98.2 F (36.8 C), temperature source Oral, resp. rate 20.There is no  height or weight on file to calculate BMI. General Appearance: Guarded Eye Contact:   fleeting Speech:  Clear and Coherent Volume:  Normal Mood:  Anxious and Dysphoric Affect:  Blunt, Non-Congruent, and Inappropriate Thought Process:  Descriptions of Associations: Circumstantial Orientation:  Full (Time, Place, and Person) Thought Content:  Illogical and Rumination Suicidal  Thoughts:  Yes.  without intent/plan Homicidal Thoughts:  No Memory:  Immediate;   Fair Recent;   Fair Judgement:  Poor Insight:  Shallow Psychomotor Activity:  Increased, Restlessness, and involuntary movements of face and extremities noted Concentration: Concentration: Fair and Attention Span: Poor Recall:  Orient Language: Fair Akathisia:  Yes Handed:  Right AIMS (if indicated):    Assets:  Desire for Improvement Housing Resilience Sleep:     Musculoskeletal: Strength & Muscle Tone: abnormal Gait & Station:  abnormal Patient leans: Front  Blood pressure (!) 149/124, pulse (!) 125, temperature 98.2 F (36.8 C), temperature source Oral, resp. rate 20.  [place] Scale:  [name] ED from 01/08/2022 in [facility] DEPT Most recent reading at 01/08/2022  3:28 PM OP Visit from 01/08/2022 in [facility] Most recent reading at 01/08/2022  2:15 PM ED from 11/29/2021 in [facility] Most recent reading at 11/29/2021  5:53 AM  C-SSRS RISK CATEGORY No Risk Low Risk Low Risk       Recommendations: Based on my evaluation the patient appears to have an emergency medical condition for which I recommend the patient be transferred to the emergency department for further evaluation. Patient transferred to [facility]; report called to Zenia Resides, [place]. Given presentation, current recommendation is medical workup to rule out any underlying metabolic/neurologic processes. Hold Thorazine due to possible EPS/akathisia symptoms present.   Inda Merlin, NP 01/11/2022, 12:12 PM

## 2022-01-11 NOTE — ED Provider Triage Note (Signed)
Emergency Medicine Provider Triage Evaluation Note  [name] , a 66 y.o. male  was evaluated in triage.  Pt complains of ongoing multiple complaints.  Seen at [facility] long for same 3 days ago.  Reports ongoing cough, believes he is in benzodiazepine withdrawal, and reports panic/anxiety attacks.  Per EMS, walked up to behavioral health reporting anxiety attacks.  Due to vitals, was recommended to go to the ED.  Reports intermittent anxiety, depression, and SI over the last few years.  Denies suicidal planning.  Review of Systems  Positive:  Negative:     See above  Physical Exam  BP (!) 160/95   Pulse (!) 112   Temp 98.7 F (37.1 C) (Oral)   Resp (!) 26   Ht '5\' 7"'$  (1.702 m)   Wt 68 kg   SpO2 92%   BMI 23.49 kg/m  Gen:   Awake, no distress   Resp:  Normal effort  MSK:   Moves extremities without difficulty  Other:  Speech tangential.  Endorses intermittent SI without intention or planning.  Medical Decision Making  Medically screening exam initiated at 12:56 PM.  Appropriate orders placed.  [name] was informed that the remainder of the evaluation will be completed by another provider, this initial triage assessment does not replace that evaluation, and the importance of remaining in the ED until their evaluation is complete.     Prince Rome, PA-C 09/81/19 1301

## 2022-01-11 NOTE — ED Triage Notes (Addendum)
Pt BIB [facility] from [facility] behavioral [facility] as a walk-up d/t him having anxiety attack. EMS was called d/t his pulse being 150 bpm & resp ranging from 20-30. He has a Hx of benzo use for 48 years & stopped taking them in 2020.  Has been having intermittent anxiety, coping issues, depression & SI since then. He des NOT want to start taking Benzos again & feels like he has "Neuro issues" from the long term usage of them & the NP at the [facility] stated that he would benefit from seeing a Neurologist. A/Ox4 114/78 135 CBG 96% n RA 12L showed ST.

## 2022-01-23 ENCOUNTER — Telehealth (HOSPITAL_COMMUNITY): Payer: Self-pay

## 2022-01-24 ENCOUNTER — Telehealth (HOSPITAL_COMMUNITY): Payer: Medicare Other | Admitting: Psychiatry

## 2022-01-25 ENCOUNTER — Telehealth (HOSPITAL_COMMUNITY): Payer: Self-pay

## 2022-01-25 ENCOUNTER — Telehealth (HOSPITAL_COMMUNITY): Payer: Medicare Other | Admitting: Student in an Organized Health Care Education/Training Program

## 2022-01-25 NOTE — Telephone Encounter (Signed)
Patient has an appointment today with Dr. Kai Levins. Writer will allow him to make this refill.

## 2022-06-07 ENCOUNTER — Telehealth (HOSPITAL_COMMUNITY): Payer: Self-pay | Admitting: Student in an Organized Health Care Education/Training Program

## 2022-06-07 NOTE — Telephone Encounter (Signed)
Received message that patient had called and wanted call back.  When called back he complained that he had been started on benzodiazepines at the emergency room when he had told them not to.  He reported he does not understand why he keeps getting kicked out of emergency rooms when people will not listen to him.  Encouraged him to make an appointment so that we could further discuss his concerns.  He reports being in significant pain and that he cannot get help. He reports he will not harm himself.   Fatima Sanger MD Resident

## 2022-08-05 ENCOUNTER — Other Ambulatory Visit (HOSPITAL_COMMUNITY): Payer: Self-pay | Admitting: Psychiatry

## 2022-08-05 DIAGNOSIS — F411 Generalized anxiety disorder: Secondary | ICD-10-CM

## 2023-01-15 ENCOUNTER — Other Ambulatory Visit: Payer: Self-pay

## 2023-01-15 ENCOUNTER — Emergency Department (HOSPITAL_COMMUNITY)
Admission: EM | Admit: 2023-01-15 | Discharge: 2023-01-15 | Disposition: A | Discharge status: Home / Self Care | Payer: 59 | Attending: Emergency Medicine | Admitting: Emergency Medicine

## 2023-01-15 ENCOUNTER — Encounter (HOSPITAL_COMMUNITY): Payer: Self-pay

## 2023-01-15 DIAGNOSIS — R053 Chronic cough: Secondary | ICD-10-CM | POA: Insufficient documentation

## 2023-01-15 DIAGNOSIS — R519 Headache, unspecified: Secondary | ICD-10-CM | POA: Diagnosis not present

## 2023-01-15 DIAGNOSIS — H1131 Conjunctival hemorrhage, right eye: Secondary | ICD-10-CM | POA: Insufficient documentation

## 2023-01-15 DIAGNOSIS — R52 Pain, unspecified: Secondary | ICD-10-CM

## 2023-01-15 NOTE — ED Triage Notes (Signed)
BIB EMS for generalized pain. Pt was on benzos for 48 years and taken off due to neurological issues. Pt has had this pain for 7 months, over the last month pain has been worse.

## 2023-01-15 NOTE — Discharge Instructions (Addendum)
1) you need to follow-up with your primary care physician because your primary care physician is going to need to continue your workup. Previous head imaging showed no abnormalities. If you are continuing to have bad headaches and you should follow-up with a neurologist. Unfortunately the emergency department is only able to treat acute life-threatening or potentially limb threatening illness. I have given you the number for both [facility] and [facility] neurology. I suggestion would be to make an appointment for bad headache which it appears you have chronic issues with.

## 2023-01-15 NOTE — ED Provider Notes (Signed)
[place] EMERGENCY DEPARTMENT AT [facility] Provider Note   CSN: [id number] Arrival date & time: 01/15/23  1709     History  Chief Complaint  Patient presents with   Extremity Pain    [name] is a 67 y.o. male who presents complaining of "poisoning/neurotoxicity."  Patient states he had been using benzodiazepine for 40 plus years. Patient come off the benzodiazepines in 2020.  He thinks this caused neurotoxicity. Patient has not been diagnosed with neurotoxicity. Patient reports taking Remeron, but he developed a bad headache.  Now patient has a bad headache every day Lysander reports taking Melatonin, and that caused his head to hurt for 2 days.  He has a cough that will not go away patient reports having problems with his vision, hearing loss, inability to walk, hallucinations, and agoraphobia. Patient states he is having severe pain and is afraid to take any medications. Patient states none of his providers believe his story.  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   Extremity Pain      Home Medications Prior to Admission medications   Medication Sig Start Date End Date Taking? Authorizing Provider  amitriptyline (ELAVIL) 10 MG tablet Take 3 tablets (30 mg total) by mouth at bedtime. 12/28/21   Lauro Franklin, MD  chlorproMAZINE (THORAZINE) 25 MG tablet Take 1 tablet (25 mg total) by mouth 3 (three) times daily. 12/13/21   Lauro Franklin, MD  hydrOXYzine (ATARAX) 50 MG tablet Take 1 tablet (50 mg total) by mouth 3 (three) times daily. 12/28/21   Lauro Franklin, MD  propranolol (INDERAL) 10 MG tablet Take 1 tablet (10 mg total) by mouth 3 (three) times daily. 01/04/22   Shanna Cisco, NP      Allergies    Morphine and codeine, Diazepam, and Gabapentin    Review of Systems   Review of Systems  Physical Exam Updated Vital Signs BP (!) 154/95   Pulse (!) 114    Temp 98.1 F (36.7 C) (Oral)   Resp 20   Ht 5\' 7"  (1.702 m)   Wt 68 kg   SpO2 97%   BMI 23.49 kg/m  Physical Exam Vitals and nursing note reviewed.  Constitutional:      General: He is not in acute distress.    Appearance: He is well-developed. He is not diaphoretic.     Comments: Patient is unable to sit still.  He is squinting and coughing  HENT:     Head: Normocephalic and atraumatic.  Eyes:     General: No scleral icterus.    Conjunctiva/sclera: Conjunctivae normal.     Comments: Small subconjunctival hemorrhage of the right medial scleral region  Cardiovascular:     Rate and Rhythm: Normal rate and regular rhythm.     Heart sounds: Normal heart sounds.  Pulmonary:     Effort: Pulmonary effort is normal. No respiratory distress.     Breath sounds: Normal breath sounds.  Abdominal:     Palpations: Abdomen is soft.     Tenderness: There is no abdominal tenderness.  Musculoskeletal:     Cervical back: Normal range of motion and neck supple.  Skin:    General: Skin is warm and dry.  Neurological:     General: No focal deficit present.     Mental Status: He is alert and oriented to person, place, and time.  Psychiatric:  Behavior: Behavior normal.     ED Results / Procedures / Treatments   Labs (all labs ordered are listed, but only abnormal results are displayed) Labs Reviewed - No data to display  EKG None  Radiology No results found.  Procedures Procedures    Medications Ordered in ED Medications - No data to display  ED Course/ Medical Decision Making/ A&P                                 Medical Decision Making  67 year old male with vague, abnormal symptoms involving almost every system of his body.  He has concern for toxicity however it is very difficult to pin down his specific complaint and this is not new.  He does think this is all related to his history of chronic benzodiazepine use.  However after review of some of the literature  having the symptoms for 4 years his rather unprecedented.  He does have concerns with coughing, twitching, gait instability and headaches and has had CT imaging in the past which has been negative.  I suspect 1 that the patient needs to follow-up with his PCP in 2 he may need a neurologic consult.  Patient does not appear to have any new concerns today but is primarily looking for help and has been unable to find anyone who can work his case.  Patient does not appear to be bleeding from his eye but does have some conjunctival hematoma.  He did not injure the eye.  He has a chronic cough which may have caused it.  He appears otherwise appropriate for outpatient follow-up        Final Clinical Impression(s) / ED Diagnoses Final diagnoses:  None    Rx / DC Orders ED Discharge Orders     None         Arthor Captain, PA-C 01/15/23 1927    Ernie Avena, MD 01/16/23 [phone]

## 2023-01-16 ENCOUNTER — Telehealth: Payer: Self-pay | Admitting: Cardiovascular Disease

## 2023-01-16 NOTE — Telephone Encounter (Signed)
New Message:  .Marland Kitchen Ahngela from Hospice called. She would like to get an order from Dr Elease Hashimoto to do a Hospice Evaluation please.

## 2023-01-17 NOTE — Telephone Encounter (Signed)
Patient called and request evaluation by hospice. Was very vague as to why he needed them. They stated he claimed it was for SOB. Official hospice eval requires prescriber order per Ahngela. They did send a CMA out to do a well check and he was not cyanotic, appeared normal status. Advised Ahngela that we haven't seen this patient in 3 years and de doesn't have ANY chronic condition that I can identify. Pt seen in [facility] ED two days ago for chronic cough and subconjunctival hemorrhage, no mention of SOB or needing hospice. She states she feels there's more going on to this and recommends against [place] getting involved. Agreed. Order not placed at this time. Pt should establish with PCP or go to ED.

## 2023-01-17 NOTE — Telephone Encounter (Signed)
Facility called back to provide fax #[phone] and to say that the patient dont have a PCP. Please advise

## 2023-02-13 ENCOUNTER — Ambulatory Visit (INDEPENDENT_AMBULATORY_CARE_PROVIDER_SITE_OTHER): Payer: 59 | Admitting: Family Medicine

## 2023-02-13 ENCOUNTER — Encounter (HOSPITAL_BASED_OUTPATIENT_CLINIC_OR_DEPARTMENT_OTHER): Payer: Self-pay | Admitting: Family Medicine

## 2023-02-13 VITALS — BP 132/86 | HR 81 | Ht 67.0 in | Wt 155.6 lb

## 2023-02-13 DIAGNOSIS — F1911 Other psychoactive substance abuse, in remission: Secondary | ICD-10-CM | POA: Diagnosis not present

## 2023-02-13 DIAGNOSIS — F331 Major depressive disorder, recurrent, moderate: Secondary | ICD-10-CM | POA: Diagnosis not present

## 2023-02-13 DIAGNOSIS — R4189 Other symptoms and signs involving cognitive functions and awareness: Secondary | ICD-10-CM | POA: Insufficient documentation

## 2023-02-13 NOTE — Assessment & Plan Note (Signed)
Patient with various concerns related to cognitive changes, reported concerns of "neurotoxicity" related to past benzodiazepine use.  He previously was referred to neurology by emergency department, however has not arranged appointment as of yet.  Given various symptoms reported, we will refer to neurology for additional evaluation and recommendations

## 2023-02-13 NOTE — Progress Notes (Signed)
New Patient Office Visit  Subjective    Patient ID: [name], male    DOB: 1955-04-18  Age: 67 y.o. MRN: [id number]  CC:  Chief Complaint  Patient presents with   New Patient (Initial Visit)    New patient pt came off valium in 2024 has been in active withdrawal pt has been going to ER pt has been seen at many different hospitals and is not getting any help all medications make pt hurt pt has read on neurotoxicity and he feels this is what he has    HPI [name] presents to establish care Last PCP - none recently  "Well I don't think I'll make it much longer" - reports that he thinks he needs a hospice evaluation due to current symptoms.  Patient reports that he has been experiencing prolonged withdrawal symptoms from past chronic benzodiazepine use.  He is reporting that he has been researching online and that he has found that his symptoms and issues are related to his chronic use of benzodiazepines for many years in the past and have led to cognitive issues and reported neurotoxicity.  He has had intermittent evaluations at emergency departments over the years generally stemming from symptoms of anxiety, pain.  His medication list currently includes amitriptyline, chlorpromazine, hydroxyzine, propranolol, however patient reports that he has not been taking any medication currently.  He was seeing psychiatrist previously, however has not had recent follow-up.  Additionally, it does appear that he was provided with referral to neurology at recent emergency department visit, however he indicates that when he contacted neurology office that there earliest appointment at that time was in December.  He indicates that he did not schedule appointment as he did not know if he would be able to make it that long.  Outpatient Encounter Medications as of 02/13/2023  Medication Sig   amitriptyline (ELAVIL) 10 MG tablet Take 3 tablets (30 mg total) by mouth at bedtime. (Patient not taking: Reported  on 02/13/2023)   chlorproMAZINE (THORAZINE) 25 MG tablet Take 1 tablet (25 mg total) by mouth 3 (three) times daily. (Patient not taking: Reported on 02/13/2023)   hydrOXYzine (ATARAX) 50 MG tablet Take 1 tablet (50 mg total) by mouth 3 (three) times daily. (Patient not taking: Reported on 02/13/2023)   propranolol (INDERAL) 10 MG tablet Take 1 tablet (10 mg total) by mouth 3 (three) times daily. (Patient not taking: Reported on 02/13/2023)   No facility-administered encounter medications on file as of 02/13/2023.    Past Medical History:  Diagnosis Date   Alcohol abuse    Allergy    Bipolar disorder (HCC)    Chronic pain    lower back pain, "that has progressed"   Depression    Diverticulosis    Dyspnea    Hepatitis C    hx. of"states he no longer has"" is clear".   History of recreational drug use    "cocaine, marijuana, polysubstance" quit 1987.   HLD (hyperlipidemia)    Hypertension    Pancreatitis 2015   Panic attack    Sleep apnea    no cpap used,still has machine(use rarely)    Past Surgical History:  Procedure Laterality Date   CARDIAC CATHETERIZATION  2003   No CAD   EUS N/A 06/23/2014   Procedure: UPPER ENDOSCOPIC ULTRASOUND (EUS) LINEAR;  Surgeon: Rachael Fee, MD;  Location: [facility] ENDOSCOPY;  Service: Endoscopy;  Laterality: N/A;   sinus surgery     TONSILLECTOMY  Family History  Problem Relation Age of Onset   Heart disease Mother    Kidney disease Mother    COPD Mother    Lupus Mother    Heart disease Father    Breast cancer Maternal Grandmother    Diabetes Maternal Grandfather     Social History   Socioeconomic History   Marital status: Divorced    Spouse name: Not on file   Number of children: 0   Years of education: GED   Highest education level: Not on file  Occupational History   Occupation: DISABLED   Occupation:    Tobacco Use   Smoking status: Former    Current packs/day: 0.00    Average packs/day: 2.0 packs/day for 16.0 years (32.0  ttl pk-yrs)    Types: Cigarettes    Start date: 04/08/1986    Quit date: 04/08/2002    Years since quitting: 20.8   Smokeless tobacco: Never  Vaping Use   Vaping status: Never Used  Substance and Sexual Activity   Alcohol use: No    Alcohol/week: 0.0 standard drinks of alcohol    Comment: Quit in 1987   Drug use: Yes    Types: Marijuana   Sexual activity: Not Currently    Partners: Female  Other Topics Concern   Not on file  Social History Narrative   Patient lives at home alone.    Disabled.   Education GED    Both handed.   Caffeine two cups of coffee daily.      Social Determinants of Health   Financial Resource Strain: Not on file  Food Insecurity: Not on file  Transportation Needs: Not on file  Physical Activity: Not on file  Stress: Not on file  Social Connections: Unknown (08/29/2022)   [facility] from [facility], [facility]   Social Network    Social Network: Not on file  Intimate Partner Violence: Unknown (08/29/2022)   Received from [facility], [facility]   HITS    Physically Hurt: Not on file    Insult or Talk Down To: Not on file    Threaten Physical Harm: Not on file    Scream or Curse: Not on file    Objective    BP 132/86 (BP Location: Right Arm, Patient Position: Sitting, Cuff Size: Normal)   Pulse 81   Ht 5\' 7"  (1.702 m)   Wt 155 lb 9.6 oz (70.6 kg)   SpO2 97%   BMI 24.37 kg/m   Physical Exam  67 year old male.  During encounter, patient was coughing/clearing throat intermittently.  Patient was restless, fidgeting.  Intermittently getting up from chair and pacing in the room.  Speech was pressured. Cardiovascular exam with regular rate and rhythm Lungs clear to auscultation bilaterally  Assessment & Plan:   Moderate episode of recurrent major depressive disorder [facility] Assessment & Plan: Advised on importance of following up regularly with psychiatry.  He has hesitancy on returning to psychiatrist that he was seen previously.  We  will place referral today to establish with new psychiatrist  Orders: -     Ambulatory referral to Psychiatry  Cognitive changes Assessment & Plan: Patient with various concerns related to cognitive changes, reported concerns of "neurotoxicity" related to past benzodiazepine use.  He previously was referred to neurology by emergency department, however has not arranged appointment as of yet.  Given various symptoms reported, we will refer to neurology for additional evaluation and recommendations  Orders: -     Ambulatory referral to Neurology  History of substance abuse (HCC) -     Ambulatory referral to Psychiatry -     Ambulatory referral to Neurology  Return in about 6 months (around 08/13/2023).  Spent 47 minutes on this patient encounter, including preparation, chart review, face-to-face counseling with patient and coordination of care, and documentation of encounter   ___________________________________________ Cydni Reddoch de [place], MD, ABFM, [facility] Primary Care and Sports Medicine [facility]

## 2023-02-13 NOTE — Assessment & Plan Note (Signed)
Advised on importance of following up regularly with psychiatry.  He has hesitancy on returning to psychiatrist that he was seen previously.  We will place referral today to establish with new psychiatrist

## 2023-02-16 ENCOUNTER — Encounter (HOSPITAL_COMMUNITY): Payer: Self-pay

## 2023-02-16 ENCOUNTER — Emergency Department (HOSPITAL_COMMUNITY)
Admission: EM | Admit: 2023-02-16 | Discharge: 2023-02-16 | Disposition: A | Discharge status: Home / Self Care | Payer: 59 | Attending: Emergency Medicine | Admitting: Emergency Medicine

## 2023-02-16 ENCOUNTER — Other Ambulatory Visit: Payer: Self-pay

## 2023-02-16 DIAGNOSIS — I10 Essential (primary) hypertension: Secondary | ICD-10-CM | POA: Insufficient documentation

## 2023-02-16 DIAGNOSIS — M791 Myalgia, unspecified site: Secondary | ICD-10-CM | POA: Diagnosis present

## 2023-02-16 DIAGNOSIS — R059 Cough, unspecified: Secondary | ICD-10-CM | POA: Insufficient documentation

## 2023-02-16 DIAGNOSIS — R52 Pain, unspecified: Secondary | ICD-10-CM

## 2023-02-16 NOTE — Discharge Instructions (Signed)
You were seen in the emergency room with total body pain.  I would recommend following up with primary care to ensure resolution of symptoms.  As discussed, I would recommend following up with neurology as you have been referred.  I would also consider pain management clinic.  Return to emergency room with any new or worsening symptoms.  Over-the-counter pain management options include Tylenol, ibuprofen, ice and heat. There are also topical options like icy- hot.

## 2023-02-16 NOTE — ED Provider Notes (Signed)
[place] EMERGENCY DEPARTMENT AT [facility] Provider Note   CSN: [id number] Arrival date & time: 02/16/23  0241     History  Chief Complaint  Patient presents with   Cough    Sigmond Norcia is a 67 y.o. male with history of hypertension, hyperlipidemia, substance use, alcohol abuse presenting to emergency room with concern of neurotoxicity.  Patient reports that he was using benzodiazepine for many years came off in 2020 and since then he thinks she has had neurotoxicity.  Patient reports he has not been diagnosed with neurotoxicity however he is seeking the diagnosis of.  Patient reports he has not taken any medications for his pain for 3 months.  Patient reports he tried melatonin and Remeron for sleep however started having side effects to these medications.  Patient reports he has had a cough for over 6 months, dry cough.  Nothing makes it better or worse.  Patient reports he can walk but he has severe pain all over, reports having trouble with his vision and hearing as well as agoraphobia.  Patient reports he was recently referred to neurology but has not been in to see them yet.   Cough      Home Medications Prior to Admission medications   Medication Sig Start Date End Date Taking? Authorizing Provider  amitriptyline (ELAVIL) 10 MG tablet Take 3 tablets (30 mg total) by mouth at bedtime. Patient not taking: Reported on 02/13/2023 12/28/21   Lauro Franklin, MD  chlorproMAZINE (THORAZINE) 25 MG tablet Take 1 tablet (25 mg total) by mouth 3 (three) times daily. Patient not taking: Reported on 02/13/2023 12/13/21   Lauro Franklin, MD  hydrOXYzine (ATARAX) 50 MG tablet Take 1 tablet (50 mg total) by mouth 3 (three) times daily. Patient not taking: Reported on 02/13/2023 12/28/21   Lauro Franklin, MD  propranolol (INDERAL) 10 MG tablet Take 1 tablet (10 mg total) by mouth 3 (three) times daily. Patient not taking: Reported on 02/13/2023 01/04/22   Shanna Cisco, NP      Allergies    Morphine and codeine, Diazepam, and Gabapentin    Review of Systems   Review of Systems  Respiratory:  Positive for cough.     Physical Exam Updated Vital Signs BP (!) 148/108   Pulse (!) 105   Temp 98.2 F (36.8 C) (Oral)   Resp 16   SpO2 97%  Physical Exam Vitals and nursing note reviewed.  Constitutional:      General: He is not in acute distress.    Appearance: He is not toxic-appearing.  HENT:     Head: Normocephalic and atraumatic.     Right Ear: Tympanic membrane, ear canal and external ear normal. There is no impacted cerumen.     Left Ear: Tympanic membrane, ear canal and external ear normal. There is no impacted cerumen.     Mouth/Throat:     Pharynx: No oropharyngeal exudate or posterior oropharyngeal erythema.  Eyes:     General: No scleral icterus.    Extraocular Movements: Extraocular movements intact.     Conjunctiva/sclera: Conjunctivae normal.  Cardiovascular:     Rate and Rhythm: Normal rate and regular rhythm.     Pulses: Normal pulses.     Heart sounds: Normal heart sounds.  Pulmonary:     Effort: Pulmonary effort is normal. No respiratory distress.     Breath sounds: Normal breath sounds. No stridor. No wheezing, rhonchi or rales.     Comments: Repetitively  coughing throughout conversation Chest:     Chest wall: No tenderness.  Abdominal:     General: Abdomen is flat. Bowel sounds are normal.     Palpations: Abdomen is soft.     Tenderness: There is no abdominal tenderness.  Skin:    General: Skin is warm and dry.     Findings: No lesion.  Neurological:     General: No focal deficit present.     Mental Status: He is alert and oriented to person, place, and time. Mental status is at baseline.     Gait: Gait normal.     Comments: Ambulated in room with normal gait     ED Results / Procedures / Treatments   Labs (all labs ordered are listed, but only abnormal results are displayed) Labs Reviewed - No data  to display  EKG None  Radiology No results found.  Procedures Procedures    Medications Ordered in ED Medications - No data to display  ED Course/ Medical Decision Making/ A&P                                 Medical Decision Making  Sabastain Sidor 67 y.o. presented today for URI like symptoms. Working DDx that I considered at this time includes, but not limited to, viral illness, pharyngitis, mono, sinusitis, electrolyte abnormality, AOM.  R/o DDx: these additional diagnoses are not consistent with patient's history, presentation, physical exam, labs/imaging findings.  Review of prior external notes: 01/15/2023 1 patient was here for same complaint  Patient refuses labs, imaging, pain medication.  Offered Tylenol and ibuprofen which are also declined.  Problem List / ED Course / Critical interventions / Medication management  Patient reporting diffuse pain and cough.  Patient is concerned he has neurotoxicity.  Patient was seen here in the past several times for this with same complaint no changes.  Patient offered labs, imaging and pain medication and he declined.  Reports he had an appointment on Thursday and was referred to neurology for his symptoms. Patient coming in with multiple complaints.  He is concern for neurotoxicity however reports he has not taken benzodiazepine for over 4 years.  Throughout conversation coughing twitching.  Patient has had workup for the same thing with negative CT head imaging.  Patient reports follow-up with neurology.  Will not make the referral today.  Patient reports the symptoms are chronic has had several visits to emergency room with negative workup for the same thing.  I feel he should follow-up with primary care and discuss referral to pain management if necessary.  Patients vitals assessed. Upon arrival patient is hemodynamically stable.  I have reviewed the patients home medicines and have made adjustments as needed     Plan:  F/u w/  PCP in 2-3d to ensure resolution of sx.  Patient was given return precautions. Patient stable for discharge at this time.  Patient educated on sx and dx and verbalized understanding of plan. Return to ER if new or worsening sx.         Final Clinical Impression(s) / ED Diagnoses Final diagnoses:  Total body pain    Rx / DC Orders ED Discharge Orders     None         Smitty Knudsen, PA-C 02/16/23 0747    Virgina Norfolk, DO 02/16/23 1440

## 2023-02-16 NOTE — ED Triage Notes (Addendum)
Patient presents with multiple complaints. Reports coughing, sore throat, teeth are numb, face is sensitive to light, loss of hearing, fingers are locking up, muscle and tendons are being loss in his arm and neurological issues. States his nose started running 3 weeks ago but he hasn't been able to breath through is nose for a few years. 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

## 2023-02-18 ENCOUNTER — Telehealth (HOSPITAL_BASED_OUTPATIENT_CLINIC_OR_DEPARTMENT_OTHER): Payer: Self-pay | Admitting: Family Medicine

## 2023-02-18 NOTE — Telephone Encounter (Signed)
Patient has appointment with neurology but said the referral was limited and all the doctor will talk about neurologic symptoms and not neuro toxins because they are not in the referral. Patient would like to know if this can be updated to talk about in his referral.

## 2023-02-19 ENCOUNTER — Ambulatory Visit: Payer: 59 | Admitting: Physician Assistant

## 2023-02-19 ENCOUNTER — Ambulatory Visit: Payer: 59

## 2023-02-19 ENCOUNTER — Encounter: Payer: Self-pay | Admitting: Physician Assistant

## 2023-02-19 NOTE — Telephone Encounter (Signed)
Patient cancelled neurology appt and scheduled with a different one Friday so he is not concerned with this anymore

## 2023-03-18 ENCOUNTER — Ambulatory Visit (HOSPITAL_BASED_OUTPATIENT_CLINIC_OR_DEPARTMENT_OTHER): Payer: Self-pay | Admitting: Family Medicine

## 2023-03-18 NOTE — Telephone Encounter (Signed)
Chief Complaint: shortness of breathe Symptoms: shortness of breath, severe coughing, headache, runny nose, body aches Frequency: x1 year but has progressively gotten worse, worst today Pertinent Negatives: Patient denies fever, chest pain, wheezing Disposition: [x] ED /[] Urgent Care (no appt availability in office) / [] Appointment(In office/virtual)/ []  [facility]/ [] Home Care/ [] Refused Recommended Disposition /[] [facility]/ []  Follow-up with PCP Additional Notes: Patient reports he has been experiencing shortness of breath for the past year and that it has progressively gotten worse, today being the worst so far. Patient reports that he is unable to say more than one or two words without severe coughing. Patient reports his SOB today is severe. Patient denies wheezing but states it feels like he is not able to take in a full breath.  Patient reports he has been seen for this in the past and told that he needed to follow up with a neurologist but denies any relief or answers to his worsening condition. Patient also reports he would like to discuss hospice options for his decline in health as he feels that is the only way to obtain symptom management and comfort. Per protocol, this RN recommended ED. Patient advised that he will go to the ED but that he will be driving himself.     Copied from CRM [id number]. Topic: Clinical - Red Word Triage >> Mar 18, 2023  9:53 AM [name] wrote: Reason for CRM: having  a hard time breathing Reason for Disposition  SEVERE difficulty breathing (e.g., struggling for each breath, speaks in single words)  Answer Assessment - Initial Assessment Questions 1. RESPIRATORY STATUS: "Describe your breathing?" (e.g., wheezing, shortness of breath, unable to speak, severe coughing)      Shortness of breath, cough,  2. ONSET: "When did this breathing problem begin?"      1 year ago, but worse every day since 3. PATTERN "Does the difficult breathing  come and go, or has it been constant since it started?"      constant 4. SEVERITY: "How bad is your breathing?" (e.g., mild, moderate, severe)    - MILD: No SOB at rest, mild SOB with walking, speaks normally in sentences, can lie down, no retractions, pulse < 100.    - MODERATE: SOB at rest, SOB with minimal exertion and prefers to sit, cannot lie down flat, speaks in phrases, mild retractions, audible wheezing, pulse 100-120.    - SEVERE: Very SOB at rest, speaks in single words, struggling to breathe, sitting hunched forward, retractions, pulse > 120      severe 5. RECURRENT SYMPTOM: "Have you had difficulty breathing before?" If Yes, ask: "When was the last time?" and "What happened that time?"      This has been going on a for a year and the doctor knows 6. CARDIAC HISTORY: "Do you have any history of heart disease?" (e.g., heart attack, angina, bypass surgery, angioplasty)      Hx of heart murmur 7. LUNG HISTORY: "Do you have any history of lung disease?"  (e.g., pulmonary embolus, asthma, emphysema)     no 8. CAUSE: "What do you think is causing the breathing problem?"      I'm not sure, they told me I need to see neurology 9. OTHER SYMPTOMS: "Do you have any other symptoms? (e.g., dizziness, runny nose, cough, chest pain, fever)     Cough,body pain, headaches, runny nose 10. O2 SATURATION MONITOR:  "Do you use an oxygen saturation monitor (pulse oximeter) at home?" If Yes, ask: "  What is your reading (oxygen level) today?" "What is your usual oxygen saturation reading?" (e.g., 95%)       95  Protocols used: Breathing Difficulty-A-AH

## 2023-03-19 ENCOUNTER — Encounter (HOSPITAL_BASED_OUTPATIENT_CLINIC_OR_DEPARTMENT_OTHER): Payer: 59

## 2023-03-31 ENCOUNTER — Ambulatory Visit (HOSPITAL_BASED_OUTPATIENT_CLINIC_OR_DEPARTMENT_OTHER): Payer: 59 | Admitting: *Deleted

## 2023-03-31 ENCOUNTER — Encounter (HOSPITAL_BASED_OUTPATIENT_CLINIC_OR_DEPARTMENT_OTHER): Payer: Self-pay

## 2023-03-31 DIAGNOSIS — Z0000 Encounter for general adult medical examination without abnormal findings: Secondary | ICD-10-CM

## 2023-03-31 NOTE — Progress Notes (Signed)
Subjective:   [name] is a 67 y.o. male who presents for Medicare Annual/Subsequent preventive examination.  Visit Complete: Virtual I connected with  [name] on 03/31/23 by a audio enabled telemedicine application and verified that I am speaking with the correct person using two identifiers.  Patient Location: Home  Provider Location: Office/Clinic  I discussed the limitations of evaluation and management by telemedicine. The patient expressed understanding and agreed to proceed.  Vital Signs: Because this visit was a virtual/telehealth visit, some criteria may be missing or patient reported. Any vitals not documented were not able to be obtained and vitals that have been documented are patient reported.  Patient Medicare AWV questionnaire was completed by the patient on 03/31/2023; I have confirmed that all information answered by patient is correct and no changes since this date.        Objective:    There were no vitals filed for this visit. There is no height or weight on file to calculate BMI.     02/16/2023    3:02 AM 01/11/2022   12:47 PM 01/08/2022    3:28 PM 11/28/2021    2:35 PM 08/23/2021   11:17 PM 06/24/2021    8:05 AM 06/24/2021    8:00 AM  Advanced Directives  Does Patient Have a Medical Advance Directive? No No No No No No No  Does patient want to make changes to medical advance directive?    No - Patient declined     Would patient like information on creating a medical advance directive?  No - Patient declined  No - Patient declined No - Patient declined No - Patient declined No - Patient declined    Current Medications (verified) Outpatient Encounter Medications as of 03/31/2023  Medication Sig   amitriptyline (ELAVIL) 10 MG tablet Take 3 tablets (30 mg total) by mouth at bedtime. (Patient not taking: Reported on 02/13/2023)   chlorproMAZINE (THORAZINE) 25 MG tablet Take 1 tablet (25 mg total) by mouth 3 (three) times daily. (Patient not taking:  Reported on 02/13/2023)   hydrOXYzine (ATARAX) 50 MG tablet Take 1 tablet (50 mg total) by mouth 3 (three) times daily. (Patient not taking: Reported on 02/13/2023)   propranolol (INDERAL) 10 MG tablet Take 1 tablet (10 mg total) by mouth 3 (three) times daily. (Patient not taking: Reported on 02/13/2023)   No facility-administered encounter medications on file as of 03/31/2023.    Allergies (verified) Morphine and codeine, Diazepam, and Gabapentin   History: Past Medical History:  Diagnosis Date   Alcohol abuse    Allergy    Bipolar disorder (HCC)    Chronic pain    lower back pain, "that has progressed"   Depression    Diverticulosis    Dyspnea    Hepatitis C    hx. of"states he no longer has"" is clear".   History of recreational drug use    "cocaine, marijuana, polysubstance" quit 1987.   HLD (hyperlipidemia)    Hypertension    Pancreatitis 2015   Panic attack    Sleep apnea    no cpap used,still has machine(use rarely)   Past Surgical History:  Procedure Laterality Date   CARDIAC CATHETERIZATION  2003   No CAD   EUS N/A 06/23/2014   Procedure: UPPER ENDOSCOPIC ULTRASOUND (EUS) LINEAR;  Surgeon: Rachael Fee, MD;  Location: [facility];  Service: Endoscopy;  Laterality: N/A;   sinus surgery     TONSILLECTOMY     Family History  Problem Relation  Age of Onset   Heart disease Mother    Kidney disease Mother    COPD Mother    Lupus Mother    Heart disease Father    Breast cancer Maternal Grandmother    Diabetes Maternal Grandfather    Social History   Socioeconomic History   Marital status: Divorced    Spouse name: Not on file   Number of children: 0   Years of education: GED   Highest education level: Not on file  Occupational History   Occupation: DISABLED   Occupation:    Tobacco Use   Smoking status: Former    Current packs/day: 0.00    Average packs/day: 2.0 packs/day for 16.0 years (32.0 ttl pk-yrs)    Types: Cigarettes    Start date: 04/08/1986     Quit date: 04/08/2002    Years since quitting: 20.9   Smokeless tobacco: Never  Vaping Use   Vaping status: Never Used  Substance and Sexual Activity   Alcohol use: No    Alcohol/week: 0.0 standard drinks of alcohol    Comment: Quit in 1987   Drug use: Yes    Types: Marijuana   Sexual activity: Not Currently    Partners: Female  Other Topics Concern   Not on file  Social History Narrative   Patient lives at home alone.    Disabled.   Education GED    Both handed.   Caffeine two cups of coffee daily.      Social Drivers of Corporate investment banker Strain: Not on file  Food Insecurity: Not on file  Transportation Needs: Not on file  Physical Activity: Not on file  Stress: Not on file  Social Connections: Unknown (08/29/2022)   [facility]   Social Network    Social Network: Not on file    Tobacco Counseling Counseling given: Not Answered   Clinical Intake:                        Activities of Daily Living     No data to display           Patient Care Team: de [place], Buren Kos, MD as PCP - General (Family Medicine) Nahser, Deloris Ping, MD as PCP - Cardiology (Cardiology) Archer Asa, MD as Consulting Physician (Psychiatry)  Indicate any recent Medical Services you may have received from other than [facility] providers in the past year (date may be approximate).     Assessment:   This is a routine wellness examination for [facility].  Hearing/Vision screen No results found.   Goals Addressed   None   Depression Screen    02/13/2023   10:23 AM 11/29/2021    2:23 AM 07/20/2020   10:18 AM 07/12/2020   10:25 AM 04/19/2020   10:39 AM 03/24/2020    1:00 PM 09/15/2019    2:55 PM  PHQ 2/9 Scores  PHQ - 2 Score 6 2   3  6   PHQ- 9 Score 27 9   12  23      Information is confidential and restricted. Go to Review Flowsheets to unlock data.     Fall Risk    02/13/2023   10:22 AM 07/23/2019    2:10 PM 07/26/2016    2:19  PM 07/16/2016    3:17 PM 03/04/2016    3:08 PM  Fall Risk   Falls in the past year? 0 0 No No Yes  Number falls  in past yr: 0 0   2 or more  Injury with Fall? 0 0   No  Risk Factor Category      High Fall Risk  Risk for fall due to : No Fall Risks    Impaired mobility;Impaired balance/gait;History of fall(s)  Follow up Falls evaluation completed    Education provided;Falls prevention discussed    MEDICARE RISK AT HOME:    TIMED UP AND GO:  Was the test performed?  No    Cognitive Function:    02/28/2014    9:02 AM  MMSE - Mini Mental State Exam  Orientation to time 5  Orientation to Place 5  Registration 3  Attention/ Calculation 5  Recall 3  Language- name 2 objects 2  Language- repeat 1  Language- follow 3 step command 2  Language- read & follow direction 1  Write a sentence 1  Copy design 1  Total score 29        Immunizations Immunization History  Administered Date(s) Administered   Fluzone Influenza virus vaccine,trivalent (IIV3), split virus 02/02/2007, 04/05/2008, 02/27/2009, 12/18/2010   H1N1 04/05/2008   Influenza Inj Mdck Quad Pf 12/26/2017   Influenza Split 06/10/2012   Influenza Whole 02/02/2007, 04/05/2008, 02/27/2009, 12/18/2010   Influenza, Seasonal, Injecte, Preservative Fre 01/04/2014, 12/21/2014, 01/17/2017, 01/15/2019   Influenza,inj,Quad PF,6+ Mos 01/04/2014, 12/21/2014, 01/17/2017, 01/15/2019   Influenza-Unspecified 02/06/2013, 01/06/2016, 01/06/2017, 12/26/2017, 01/06/2018   Novel Infuenza-h1n1-09 04/05/2008   PFIZER(Purple Top)SARS-COV-2 Vaccination 09/09/2019   Pneumococcal Polysaccharide-23 08/25/2014   Tdap 10/01/2017   Zoster Recombinant(Shingrix) 10/02/2017, 12/25/2017   Zoster, Unspecified 10/02/2017, 12/25/2017    TDAP status: Due, Education has been provided regarding the importance of this vaccine. Advised may receive this vaccine at local pharmacy or Health Dept. Aware to provide a copy of the vaccination record if obtained  from local pharmacy or Health Dept. Verbalized acceptance and understanding.  Flu Vaccine status: Declined, Education has been provided regarding the importance of this vaccine but patient still declined. Advised may receive this vaccine at local pharmacy or Health Dept. Aware to provide a copy of the vaccination record if obtained from local pharmacy or Health Dept. Verbalized acceptance and understanding.  Pneumococcal vaccine status: Due, Education has been provided regarding the importance of this vaccine. Advised may receive this vaccine at local pharmacy or Health Dept. Aware to provide a copy of the vaccination record if obtained from local pharmacy or Health Dept. Verbalized acceptance and understanding.  Covid-19 vaccine status: Declined, Education has been provided regarding the importance of this vaccine but patient still declined. Advised may receive this vaccine at local pharmacy or Health Dept.or vaccine clinic. Aware to provide a copy of the vaccination record if obtained from local pharmacy or Health Dept. Verbalized acceptance and understanding.  Qualifies for Shingles Vaccine? Yes   Zostavax completed Yes   Shingrix Completed?: Yes  Screening Tests Health Maintenance  Topic Date Due   Pneumonia Vaccine 78+ Years old (2 of 2 - PCV) 06/05/2022   COVID-19 Vaccine (2 - 2024-25 season) 12/08/2022   INFLUENZA VACCINE  07/07/2023 (Originally 11/07/2022)   Medicare Annual Wellness (AWV)  03/30/2024   DTaP/Tdap/Td (2 - Td or Tdap) 10/02/2027   Colonoscopy  10/21/2029   Hepatitis C Screening  Completed   HIV Screening  Completed   Zoster Vaccines- Shingrix  Completed   HPV VACCINES  Aged Out    Health Maintenance  Health Maintenance Due  Topic Date Due   Pneumonia Vaccine 38+ Years old (2 of 2 -  PCV) 06/05/2022   COVID-19 Vaccine (2 - 2024-25 season) 12/08/2022    Colorectal cancer screening: Type of screening: Colonoscopy. Completed 10/22/2019. Repeat every 10 years  Lung  Cancer Screening: (Low Dose CT Chest recommended if Age 52-80 years, 20 pack-year currently smoking OR have quit w/in 15years.) does qualify.   Lung Cancer Screening Referral: Patient declined at this time   Additional Screening:  Hepatitis C Screening: does qualify; Completed   Vision Screening: Recommended annual ophthalmology exams for early detection of glaucoma and other disorders of the eye. Is the patient up to date with their annual eye exam?  No  Who is the provider or what is the name of the office in which the patient attends annual eye exams? Does not want to do eye exam at this time If pt is not established with a provider, would they like to be referred to a provider to establish care? No .   Dental Screening: Recommended annual dental exams for proper oral hygiene   Community Resource Referral / Chronic Care Management: CRR required this visit?  No   CCM required this visit?  No     Plan:     I have personally reviewed and noted the following in the patient's chart:   Medical and social history Use of alcohol, tobacco or illicit drugs  Current medications and supplements including opioid prescriptions. Patient is not currently taking opioid prescriptions. Functional ability and status Nutritional status Physical activity Advanced directives List of other physicians Hospitalizations, surgeries, and ER visits in previous 12 months Vitals Screenings to include cognitive, depression, and falls Referrals and appointments  In addition, I have reviewed and discussed with patient certain preventive protocols, quality metrics, and best practice recommendations. A written personalized care plan for preventive services as well as general preventive health recommendations were provided to patient.     Park Breed, CMA   03/31/2023   After Visit Summary: (Mail) Due to this being a telephonic visit, the after visit summary with patients personalized plan was offered  to patient via mail   Nurse Notes:  Mr. Hutchings , Thank you for taking time to come for your Medicare Wellness Visit. I appreciate your ongoing commitment to your health goals. Please review the following plan we discussed and let me know if I can assist you in the future.   These are the goals we discussed:  Goals      Blood Pressure < 140/90     Patient Stated     Would like to be better able to manage pain and think straight        This is a list of the screening recommended for you and due dates:  Health Maintenance  Topic Date Due   Pneumonia Vaccine (2 of 2 - PCV) 06/05/2022   COVID-19 Vaccine (2 - 2024-25 season) 12/08/2022   Flu Shot  07/07/2023*   Medicare Annual Wellness Visit  03/30/2024   DTaP/Tdap/Td vaccine (2 - Td or Tdap) 10/02/2027   Colon Cancer Screening  10/21/2029   Hepatitis C Screening  Completed   HIV Screening  Completed   Zoster (Shingles) Vaccine  Completed   HPV Vaccine  Aged Out  *Topic was postponed. The date shown is not the original due date.

## 2023-03-31 NOTE — Patient Instructions (Signed)
[name] , Thank you for taking time to come for your Medicare Wellness Visit. I appreciate your ongoing commitment to your health goals. Please review the following plan we discussed and let me know if I can assist you in the future.   Screening recommendations/referrals: Colonoscopy: up to date due 2031 Recommended yearly ophthalmology/optometry visit for glaucoma screening and checkup Recommended yearly dental visit for hygiene and checkup  Vaccinations: Influenza vaccine: declined Pneumococcal vaccine: declined Tdap vaccine: declined Shingles vaccine: completed    Advanced directives: pt brother (copy requested  Conditions/risks identified: falls  Next appointment: 1 year  Preventive Care 65 Years and Older, Male Preventive care refers to lifestyle choices and visits with your health care provider that can promote health and wellness. What does preventive care include? A yearly physical exam. This is also called an annual well check. Dental exams once or twice a year. Routine eye exams. Ask your health care provider how often you should have your eyes checked. Personal lifestyle choices, including: Daily care of your teeth and gums. Regular physical activity. Eating a healthy diet. Avoiding tobacco and drug use. Limiting alcohol use. Practicing safe sex. Taking low doses of aspirin every day. Taking vitamin and mineral supplements as recommended by your health care provider. What happens during an annual well check? The services and screenings done by your health care provider during your annual well check will depend on your age, overall health, lifestyle risk factors, and family history of disease. Counseling  Your health care provider may ask you questions about your: Alcohol use. Tobacco use. Drug use. Emotional well-being. Home and relationship well-being. Sexual activity. Eating habits. History of falls. Memory and ability to understand (cognition). Work  and work Astronomer. Screening  You may have the following tests or measurements: Height, weight, and BMI. Blood pressure. Lipid and cholesterol levels. These may be checked every 5 years, or more frequently if you are over 65 years old. Skin check. Lung cancer screening. You may have this screening every year starting at age 65 if you have a 30-pack-year history of smoking and currently smoke or have quit within the past 15 years. Fecal occult blood test (FOBT) of the stool. You may have this test every year starting at age 65. Flexible sigmoidoscopy or colonoscopy. You may have a sigmoidoscopy every 5 years or a colonoscopy every 10 years starting at age 65. Prostate cancer screening. Recommendations will vary depending on your family history and other risks. Hepatitis C blood test. Hepatitis B blood test. Sexually transmitted disease (STD) testing. Diabetes screening. This is done by checking your blood sugar (glucose) after you have not eaten for a while (fasting). You may have this done every 1-3 years. Abdominal aortic aneurysm (AAA) screening. You may need this if you are a current or former smoker. Osteoporosis. You may be screened starting at age 65 if you are at high risk. Talk with your health care provider about your test results, treatment options, and if necessary, the need for more tests. Vaccines  Your health care provider may recommend certain vaccines, such as: Influenza vaccine. This is recommended every year. Tetanus, diphtheria, and acellular pertussis (Tdap, Td) vaccine. You may need a Td booster every 10 years. Zoster vaccine. You may need this after age 80. Pneumococcal 13-valent conjugate (PCV13) vaccine. One dose is recommended after age 65. Pneumococcal polysaccharide (PPSV23) vaccine. One dose is recommended after age 65. Talk to your health care provider about which screenings and vaccines you need and how often  you need them. This information is not intended  to replace advice given to you by your health care provider. Make sure you discuss any questions you have with your health care provider. Document Released: 04/21/2015 Document Revised: 12/13/2015 Document Reviewed: 01/24/2015 Elsevier Interactive Patient Education  2017 [employer].  Fall Prevention in the Home Falls can cause injuries. They can happen to people of all ages. There are many things you can do to make your home safe and to help prevent falls. What can I do on the outside of my home? Regularly fix the edges of walkways and driveways and fix any cracks. Remove anything that might make you trip as you walk through a door, such as a raised step or threshold. Trim any bushes or trees on the path to your home. Use bright outdoor lighting. Clear any walking paths of anything that might make someone trip, such as rocks or tools. Regularly check to see if handrails are loose or broken. Make sure that both sides of any steps have handrails. Any raised decks and porches should have guardrails on the edges. Have any leaves, snow, or ice cleared regularly. Use sand or salt on walking paths during winter. Clean up any spills in your garage right away. This includes oil or grease spills. What can I do in the bathroom? Use night lights. Install grab bars by the toilet and in the tub and shower. Do not use towel bars as grab bars. Use non-skid mats or decals in the tub or shower. If you need to sit down in the shower, use a plastic, non-slip stool. Keep the floor dry. Clean up any water that spills on the floor as soon as it happens. Remove soap buildup in the tub or shower regularly. Attach bath mats securely with double-sided non-slip rug tape. Do not have throw rugs and other things on the floor that can make you trip. What can I do in the bedroom? Use night lights. Make sure that you have a light by your bed that is easy to reach. Do not use any sheets or blankets that are too big  for your bed. They should not hang down onto the floor. Have a firm chair that has side arms. You can use this for support while you get dressed. Do not have throw rugs and other things on the floor that can make you trip. What can I do in the kitchen? Clean up any spills right away. Avoid walking on wet floors. Keep items that you use a lot in easy-to-reach places. If you need to reach something above you, use a strong step stool that has a grab bar. Keep electrical cords out of the way. Do not use floor polish or wax that makes floors slippery. If you must use wax, use non-skid floor wax. Do not have throw rugs and other things on the floor that can make you trip. What can I do with my stairs? Do not leave any items on the stairs. Make sure that there are handrails on both sides of the stairs and use them. Fix handrails that are broken or loose. Make sure that handrails are as long as the stairways. Check any carpeting to make sure that it is firmly attached to the stairs. Fix any carpet that is loose or worn. Avoid having throw rugs at the top or bottom of the stairs. If you do have throw rugs, attach them to the floor with carpet tape. Make sure that you have a light  switch at the top of the stairs and the bottom of the stairs. If you do not have them, ask someone to add them for you. What else can I do to help prevent falls? Wear shoes that: Do not have high heels. Have rubber bottoms. Are comfortable and fit you well. Are closed at the toe. Do not wear sandals. If you use a stepladder: Make sure that it is fully opened. Do not climb a closed stepladder. Make sure that both sides of the stepladder are locked into place. Ask someone to hold it for you, if possible. Clearly mark and make sure that you can see: Any grab bars or handrails. First and last steps. Where the edge of each step is. Use tools that help you move around (mobility aids) if they are needed. These  include: Canes. Walkers. Scooters. Crutches. Turn on the lights when you go into a dark area. Replace any light bulbs as soon as they burn out. Set up your furniture so you have a clear path. Avoid moving your furniture around. If any of your floors are uneven, fix them. If there are any pets around you, be aware of where they are. Review your medicines with your doctor. Some medicines can make you feel dizzy. This can increase your chance of falling. Ask your doctor what other things that you can do to help prevent falls. This information is not intended to replace advice given to you by your health care provider. Make sure you discuss any questions you have with your health care provider. Document Released: 01/19/2009 Document Revised: 08/31/2015 Document Reviewed: 04/29/2014 Elsevier Interactive Patient Education  2017 [employer].

## 2023-04-22 ENCOUNTER — Other Ambulatory Visit: Payer: Self-pay

## 2023-04-22 ENCOUNTER — Emergency Department (HOSPITAL_COMMUNITY)
Admission: EM | Admit: 2023-04-22 | Discharge: 2023-04-23 | Disposition: A | Discharge status: Home / Self Care | Payer: 59 | Attending: Emergency Medicine | Admitting: Emergency Medicine

## 2023-04-22 ENCOUNTER — Encounter (HOSPITAL_COMMUNITY): Payer: Self-pay

## 2023-04-22 DIAGNOSIS — G8929 Other chronic pain: Secondary | ICD-10-CM | POA: Insufficient documentation

## 2023-04-22 DIAGNOSIS — M791 Myalgia, unspecified site: Secondary | ICD-10-CM | POA: Diagnosis present

## 2023-04-22 DIAGNOSIS — R52 Pain, unspecified: Secondary | ICD-10-CM

## 2023-04-22 LAB — URINALYSIS, ROUTINE W REFLEX MICROSCOPIC
Bilirubin Urine: NEGATIVE
Glucose, UA: NEGATIVE mg/dL
Hgb urine dipstick: NEGATIVE
Ketones, ur: NEGATIVE mg/dL
Leukocytes,Ua: NEGATIVE
Nitrite: NEGATIVE
Protein, ur: NEGATIVE mg/dL
Specific Gravity, Urine: 1.021 (ref 1.005–1.030)
pH: 5 (ref 5.0–8.0)

## 2023-04-22 LAB — CBC WITH DIFFERENTIAL/PLATELET
Abs Immature Granulocytes: 0.03 10*3/uL (ref 0.00–0.07)
Basophils Absolute: 0 10*3/uL (ref 0.0–0.1)
Basophils Relative: 0 %
Eosinophils Absolute: 0.1 10*3/uL (ref 0.0–0.5)
Eosinophils Relative: 1 %
HCT: 47.5 % (ref 39.0–52.0)
Hemoglobin: 15.4 g/dL (ref 13.0–17.0)
Immature Granulocytes: 0 %
Lymphocytes Relative: 25 %
Lymphs Abs: 2.3 10*3/uL (ref 0.7–4.0)
MCH: 26.8 pg (ref 26.0–34.0)
MCHC: 32.4 g/dL (ref 30.0–36.0)
MCV: 82.8 fL (ref 80.0–100.0)
Monocytes Absolute: 0.8 10*3/uL (ref 0.1–1.0)
Monocytes Relative: 9 %
Neutro Abs: 5.8 10*3/uL (ref 1.7–7.7)
Neutrophils Relative %: 65 %
Platelets: 248 10*3/uL (ref 150–400)
RBC: 5.74 MIL/uL (ref 4.22–5.81)
RDW: 13.7 % (ref 11.5–15.5)
WBC: 9.2 10*3/uL (ref 4.0–10.5)
nRBC: 0 % (ref 0.0–0.2)

## 2023-04-22 LAB — COMPREHENSIVE METABOLIC PANEL
ALT: 37 U/L (ref 0–44)
AST: 26 U/L (ref 15–41)
Albumin: 4.4 g/dL (ref 3.5–5.0)
Alkaline Phosphatase: 64 U/L (ref 38–126)
Anion gap: 10 (ref 5–15)
BUN: 23 mg/dL (ref 8–23)
CO2: 21 mmol/L — ABNORMAL LOW (ref 22–32)
Calcium: 9.5 mg/dL (ref 8.9–10.3)
Chloride: 108 mmol/L (ref 98–111)
Creatinine, Ser: 0.9 mg/dL (ref 0.61–1.24)
GFR, Estimated: >60 mL/min (ref >60)
Glucose, Bld: 101 mg/dL — ABNORMAL HIGH (ref 70–99)
Potassium: 3.9 mmol/L (ref 3.5–5.1)
Sodium: 139 mmol/L (ref 135–145)
Total Bilirubin: 0.7 mg/dL (ref 0.0–1.2)
Total Protein: 7.8 g/dL (ref 6.5–8.1)

## 2023-04-22 LAB — ETHANOL: Alcohol, Ethyl (B): <10 mg/dL (ref <10)

## 2023-04-22 LAB — CK: Total CK: 112 U/L (ref 49–397)

## 2023-04-22 LAB — RAPID URINE DRUG SCREEN, HOSP PERFORMED
Amphetamines: NOT DETECTED
Barbiturates: NOT DETECTED
Benzodiazepines: NOT DETECTED
Cocaine: NOT DETECTED
Opiates: NOT DETECTED
Tetrahydrocannabinol: POSITIVE — AB

## 2023-04-22 LAB — MAGNESIUM: Magnesium: 2.3 mg/dL (ref 1.7–2.4)

## 2023-04-22 NOTE — ED Provider Triage Note (Signed)
 Emergency Medicine Provider Triage Evaluation Note  Maurizio Geno , a 68 y.o. male  was evaluated in triage.  Pt complains of total body pain. 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 He reports he thinks this has all started since he was taken off of valium in August 2022. Reports chronic cough. 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  Review of Systems  Positive:  Negative:   Physical Exam  BP (!) 165/106 (BP Location: Left Arm)   Pulse 76   Temp 97.7 F (36.5 C) (Oral)   Resp (!) 26   Ht 5' 7 (1.702 m)   Wt 70.6 kg   SpO2 98%   BMI 24.38 kg/m  Gen:   Awake, no distress   Resp:  Normal effort  MSK:   Moves extremities without difficulty  Other:  Ambulatory. Coughing frequently during the exam.   Medical Decision Making  Medically screening exam initiated at 6:44 PM.  Appropriate orders placed.  [name] was informed that the remainder of the evaluation will be completed by another provider, this initial triage assessment does not replace that evaluation, and the importance of remaining in the ED until their evaluation is complete.  Labs ordered. The patient has been seen here multiple times for these symptoms.    Bernis Ernst, PA-C 04/22/23 2029

## 2023-04-22 NOTE — ED Triage Notes (Addendum)
 Pt reports with multiple complaints, pt reports pain all over and states that he has neurotoxicity. This has been going on for over a year.

## 2023-04-23 NOTE — ED Provider Notes (Signed)
 [place] EMERGENCY DEPARTMENT AT [facility] Provider Note   CSN: [id number] Arrival date & time: 04/22/23  1807     History  Chief Complaint  Patient presents with   multiple complaints    [name] is a 68 y.o. male.  The history is provided by the patient.   [name] is a 68 y.o. male who presents to the Emergency Department complaining of body pain.  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  He states that the pain is intolerable.  No fevers, chest pain, vomiting, diarrhea.  He does have a chronic cough.  He states that he has been evaluated with upper endoscopy, colonoscopy.  He states that he is seeing psychiatry and they have tried to put him back on the medications that have triggered this.  He denies any SI, HI, hallucinations.  He lives alone.  He feels safe at home.  No tobacco, alcohol, drug use.  He has no known medical problems.    Home Medications Prior to Admission medications   Not on File      Allergies    Morphine  and codeine, Diazepam, and Gabapentin     Review of Systems   Review of Systems  All other systems reviewed and are negative.   Physical Exam Updated Vital Signs BP (!) 149/112   Pulse 81   Temp (!) 97.4 F (36.3 C) (Oral)   Resp 19   Ht 5\' 7"  (1.702 m)   Wt 70.6 kg   SpO2 98%   BMI 24.38 kg/m  Physical Exam Vitals and nursing note reviewed.  Constitutional:      Appearance: He is well-developed.  HENT:     Head: Normocephalic and atraumatic.  Cardiovascular:     Rate and Rhythm: Normal rate and regular rhythm.     Heart sounds: No murmur heard. Pulmonary:     Effort: Pulmonary effort is normal. No respiratory distress.     Breath sounds: Normal breath sounds.  Abdominal:     Palpations: Abdomen is soft.     Tenderness: There is no abdominal tenderness. There is  no guarding or rebound.  Musculoskeletal:        General: No tenderness.  Skin:    General: Skin is warm and dry.  Neurological:     Mental Status: He is alert and oriented to person, place, and time.     Comments: 5 out of 5 strength in all 4 extremities with sensation to light touch intact in all 4 extremities  Psychiatric:     Comments: Pressured speech.  Tangential.  Does not appear to be responding to internal stimuli.  Slightly paranoid.     ED Results / Procedures / Treatments   Labs (all labs ordered are listed, but only abnormal results are displayed) Labs Reviewed  RAPID URINE DRUG SCREEN, HOSP PERFORMED - Abnormal; Notable for the following components:      Result Value   Tetrahydrocannabinol POSITIVE (*)    All other components within normal limits  COMPREHENSIVE METABOLIC PANEL - Abnormal; Notable for the following components:   CO2 21 (*)    Glucose, Bld 101 (*)    All other components within normal limits  ETHANOL  MAGNESIUM   CK  CBC WITH DIFFERENTIAL/PLATELET  URINALYSIS, ROUTINE W REFLEX MICROSCOPIC    EKG None  Radiology No results found.  Procedures Procedures  Medications Ordered in ED Medications - No data to display  ED Course/ Medical Decision Making/ A&P                                 Medical Decision Making  Patient here for evaluation of chronic pain all over for what he attributes as benzodiazepine withdrawal and neurotoxicity.  He is nontoxic-appearing on evaluation with no focal neurologic deficits.  He does have a repetitive quiet cough, that disappears when there is no one at the bedside.  The cough comes back with conversation.  He is paranoid with slightly pressured speech but he is not psychotic.  Concern for underlying untreated psychiatric illness but patient does not currently meet IVC criteria.  Offered behavioral health resources and patient is not willing to follow-up at this time.  Discussed that he will need additional  psychiatry as well as PCP follow-up for his numerous complaints.  No evidence of acute medical emergency at this time.  Feel he is stable for outpatient resources with return precautions.        Final Clinical Impression(s) / ED Diagnoses Final diagnoses:  Body aches    Rx / DC Orders ED Discharge Orders     None         Kelsey Patricia, MD 04/23/23 [phone]

## 2023-04-23 NOTE — ED Notes (Signed)
Pt left before AVS could be printed.

## 2023-04-25 ENCOUNTER — Ambulatory Visit (HOSPITAL_BASED_OUTPATIENT_CLINIC_OR_DEPARTMENT_OTHER): Payer: Self-pay | Admitting: Family Medicine

## 2023-04-25 NOTE — Telephone Encounter (Signed)
Called pt back. Pt stated CO2 levels from ER visit was 21 and he was wanting to get in touch with Dr Tommi Rumps [place]. Advised Dr Tommi Rumps [place] was out of the office until 1/21 and if he was having issues and the CO2 was low he would need to go back to the ER to be evaluated. Pt stated he was not going back to the ER and hung up the phone.

## 2023-04-25 NOTE — Telephone Encounter (Addendum)
Chief Complaint: cough Symptoms: cough, SOB Frequency: Chronic 1.5 years, States worsening over the last week.  Pertinent Negatives: Patient denies CP Disposition: [] ED /[x] Urgent Care (no appt availability in office) / [] Appointment(In office/virtual)/ []  [place] [facility]/ [] Home Care/ [x] Refused Recommended Disposition /[] [place] [facility]/ []  Follow-up with PCP Additional Notes: Patient called reporting constant cough and SOB x 1.5 years but becoming more constant recently. Patient is speaking in full sentences to this RN. Speech is pressured during conversation and patient lists multiple complaints. States he feels the cough is from withdrawing from benzodiazepines in 2020. Patient states his symptoms are better today than yesterday. He reports "two pages of symptoms" that he will bring for provider to review. Patient is concerned about his CO2 level from the ER visit.  Denies increased SOB at this time. During call, patient was able to check oxygen with home pulseox, 96%RA and HR77. Patient denies SI/HI/AVH. Per protocol, patient to be evaluated within 4 hours. Next available appt in clinic 05/12/23 @ 0830. This RN discussed alternate provider or higher level of care, patient declined requesting callback from PCP. Care advice reviewed with patient, understanding verbalized. Denies further questions at this time. Alerting PCP for review.  Attempted to call [facility], spoke with Phoebe Sharps who advised all nurses are with patients at this time. She will notify a nurse to review triage when available.    Copied from CRM [id number]. Topic: Clinical - Red Word Triage >> Apr 25, 2023 10:53 AM Fuller Mandril wrote: Red Word that prompted transfer to Nurse Triage: Difficulty breathing, coughing, CO2 level 21. Reason for Disposition  [1] MILD difficulty breathing (e.g., minimal/no SOB at rest, SOB with walking, pulse <100) AND [2] NEW-onset or WORSE than normal  Answer Assessment - Initial Assessment  Questions 1. RESPIRATORY STATUS: "Describe your breathing?" (e.g., wheezing, shortness of breath, unable to speak, severe coughing)      Constant coughing, SOB- speaking in full sentences 2. ONSET: "When did this breathing problem begin?"      Chronic, was evaluated in ED 04/22/23 for worsening symptoms 3. PATTERN "Does the difficult breathing come and go, or has it been constant since it started?"      Constant 4. SEVERITY: "How bad is your breathing?" (e.g., mild, moderate, severe)    - MILD: No SOB at rest, mild SOB with walking, speaks normally in sentences, can lie down, no retractions, pulse < 100.    - MODERATE: SOB at rest, SOB with minimal exertion and prefers to sit, cannot lie down flat, speaks in phrases, mild retractions, audible wheezing, pulse 100-120.    - SEVERE: Very SOB at rest, speaks in single words, struggling to breathe, sitting hunched forward, retractions, pulse > 120      Reports severe- but patient is speaking in full sentences. 5. RECURRENT SYMPTOM: "Have you had difficulty breathing before?" If Yes, ask: "When was the last time?" and "What happened that time?"      Approx 1.5 years 6. CARDIAC HISTORY: "Do you have any history of heart disease?" (e.g., heart attack, angina, bypass surgery, angioplasty)      HTN, irregular heartbeat 7. LUNG HISTORY: "Do you have any history of lung disease?"  (e.g., pulmonary embolus, asthma, emphysema)     Denies 8. CAUSE: "What do you think is causing the breathing problem?"      When I come off benzos- reports he began coming since off 2018- stopped completely Dec 2020. 9. OTHER SYMPTOMS: "Do you have any other symptoms? (e.g.,  dizziness, runny nose, cough, chest pain, fever)     Cough, "I got two pages of symptoms" 10. O2 SATURATION MONITOR:  "Do you use an oxygen saturation monitor (pulse oximeter) at home?" If Yes, ask: "What is your reading (oxygen level) today?" "What is your usual oxygen saturation reading?" (e.g., 95%)        Monitoring at home, during call 96%  Protocols used: Breathing Difficulty-A-AH

## 2023-05-03 ENCOUNTER — Emergency Department (HOSPITAL_COMMUNITY)
Admission: EM | Admit: 2023-05-03 | Discharge: 2023-05-03 | Disposition: A | Discharge status: Home / Self Care | Payer: 59 | Attending: Emergency Medicine | Admitting: Emergency Medicine

## 2023-05-03 ENCOUNTER — Emergency Department (HOSPITAL_COMMUNITY): Payer: 59

## 2023-05-03 ENCOUNTER — Other Ambulatory Visit: Payer: Self-pay

## 2023-05-03 DIAGNOSIS — F410 Panic disorder [episodic paroxysmal anxiety] without agoraphobia: Secondary | ICD-10-CM | POA: Insufficient documentation

## 2023-05-03 DIAGNOSIS — I10 Essential (primary) hypertension: Secondary | ICD-10-CM | POA: Diagnosis not present

## 2023-05-03 DIAGNOSIS — R059 Cough, unspecified: Secondary | ICD-10-CM | POA: Insufficient documentation

## 2023-05-03 DIAGNOSIS — R053 Chronic cough: Secondary | ICD-10-CM | POA: Insufficient documentation

## 2023-05-03 DIAGNOSIS — R079 Chest pain, unspecified: Secondary | ICD-10-CM

## 2023-05-03 LAB — CBC
HCT: 47.6 % (ref 39.0–52.0)
Hemoglobin: 15.7 g/dL (ref 13.0–17.0)
MCH: 27 pg (ref 26.0–34.0)
MCHC: 33 g/dL (ref 30.0–36.0)
MCV: 81.8 fL (ref 80.0–100.0)
Platelets: 267 10*3/uL (ref 150–400)
RBC: 5.82 MIL/uL — ABNORMAL HIGH (ref 4.22–5.81)
RDW: 13.8 % (ref 11.5–15.5)
WBC: 6.6 10*3/uL (ref 4.0–10.5)
nRBC: 0 % (ref 0.0–0.2)

## 2023-05-03 LAB — BASIC METABOLIC PANEL
Anion gap: 10 (ref 5–15)
BUN: 23 mg/dL (ref 8–23)
CO2: 22 mmol/L (ref 22–32)
Calcium: 9.6 mg/dL (ref 8.9–10.3)
Chloride: 109 mmol/L (ref 98–111)
Creatinine, Ser: 0.93 mg/dL (ref 0.61–1.24)
GFR, Estimated: >60 mL/min (ref >60)
Glucose, Bld: 113 mg/dL — ABNORMAL HIGH (ref 70–99)
Potassium: 3.8 mmol/L (ref 3.5–5.1)
Sodium: 141 mmol/L (ref 135–145)

## 2023-05-03 LAB — TROPONIN I (HIGH SENSITIVITY)
Troponin I (High Sensitivity): 2 ng/L (ref <18)
Troponin I (High Sensitivity): <2 ng/L (ref <18)

## 2023-05-03 MED ORDER — KETOROLAC TROMETHAMINE 15 MG/ML IJ SOLN
15.0000 mg | Freq: Once | INTRAMUSCULAR | Status: AC
  Administered 2023-05-03: 15 mg via INTRAMUSCULAR
  Filled 2023-05-03: qty 1

## 2023-05-03 NOTE — Discharge Instructions (Signed)
Follow-up with your primary care doctor.  Return for new or worsening symptoms.

## 2023-05-03 NOTE — ED Triage Notes (Signed)
Patient to ED by EMS with c/o SOB and chest pain. Per EMS patient c/o pain while inhaling and exhaling states it has been going on for a year. He voiced taking Benzos for anxiety but does not know med. Patient denies cough, fever or chills.  115/112 100 97% RA

## 2023-05-03 NOTE — ED Notes (Signed)
Pt states that his headache is past a 10 and has been for months. No relief with toradol. Pt requesting to speak with a priest because he says he is dying. Dr. Earlene Plater notified

## 2023-05-03 NOTE — ED Notes (Signed)
Pt refuses covid swab.

## 2023-05-03 NOTE — ED Provider Notes (Signed)
[place] EMERGENCY DEPARTMENT AT [facility] Provider Note   CSN: [id number] Arrival date & time: 05/03/23  1501     History  Chief Complaint  Patient presents with   Chest Pain    Roxas Clymer is a 68 y.o. male.   Chest Pain 68 year old male with history of chronic pain, bipolar disorder, hypertension, hyperlipidemia presenting for chest pain, body aches, shortness of breath.  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  He states he has not used any drugs or alcohol for many years but he has chronic pain all over.  He said chronic cough for some years as well.  For last 3 to 4 days he is felt more short of breath.  He endorses some generalized chest pain.  No nausea or vomiting.  No headache or neurologic changes.  No abdominal pain.  He has not seen a doctor recently.  Denies any SI or HI or AVH.     Home Medications Prior to Admission medications   Not on File      Allergies    Diazepam, Morphine and codeine, and Gabapentin    Review of Systems   Review of Systems  Cardiovascular:  Positive for chest pain.  Review of systems completed and notable as per HPI.  ROS otherwise negative.   Physical Exam Updated Vital Signs BP 122/76   Pulse 65   Temp 97.7 F (36.5 C) (Oral)   Resp 18   Ht 5\' 7"  (1.702 m)   Wt 70 kg   SpO2 96%   BMI 24.17 kg/m  Physical Exam Vitals and nursing note reviewed.  Constitutional:      General: He is not in acute distress.    Appearance: He is well-developed.     Comments: Patient anxious, frequent cough.  HENT:     Head: Normocephalic and atraumatic.     Mouth/Throat:     Mouth: Mucous membranes are moist.     Pharynx: Oropharynx is clear.  Eyes:     Extraocular Movements: Extraocular movements intact.     Conjunctiva/sclera: Conjunctivae normal.     Pupils: Pupils are equal, round, and reactive to light.  Cardiovascular:     Rate and Rhythm: Normal rate and regular rhythm.     Pulses:  Normal pulses.     Heart sounds: Normal heart sounds. No murmur heard. Pulmonary:     Effort: Pulmonary effort is normal. No respiratory distress.     Breath sounds: Normal breath sounds.  Abdominal:     Palpations: Abdomen is soft.     Tenderness: There is no abdominal tenderness.  Musculoskeletal:        General: No swelling.     Cervical back: Neck supple.     Right lower leg: No edema.     Left lower leg: No edema.  Skin:    General: Skin is warm and dry.     Capillary Refill: Capillary refill takes less than 2 seconds.  Neurological:     General: No focal deficit present.     Mental Status: He is alert and oriented to person, place, and time. Mental status is at baseline.     Cranial Nerves: No cranial nerve deficit.     Sensory: No sensory deficit.     Motor: No weakness.  Psychiatric:        Mood and Affect: Mood normal.     ED Results / Procedures / Treatments   Labs (all labs ordered are listed,  but only abnormal results are displayed) Labs Reviewed  BASIC METABOLIC PANEL - Abnormal; Notable for the following components:      Result Value   Glucose, Bld 113 (*)    All other components within normal limits  CBC - Abnormal; Notable for the following components:   RBC 5.82 (*)    All other components within normal limits  TROPONIN I (HIGH SENSITIVITY)  TROPONIN I (HIGH SENSITIVITY)    EKG EKG Interpretation Date/Time:  Saturday May 03 2023 15:22:46 EST Ventricular Rate:  82 PR Interval:  163 QRS Duration:  95 QT Interval:  381 QTC Calculation: 445 R Axis:   157  Text Interpretation: Sinus rhythm Consider right atrial enlargement Left posterior fascicular block Abnormal R-wave progression, late transition Confirmed by Fulton Reek [phone]) on 05/03/2023 5:57:55 PM  Radiology DG Chest 2 View Result Date: 05/03/2023 CLINICAL DATA:  Shortness of breath.  Chest pain. EXAM: CHEST - 2 VIEW COMPARISON:  Chest radiograph dated October 01, 2020. FINDINGS: The  heart size and mediastinal contours are within normal limits. No focal consolidation, pleural effusion, or pneumothorax. No acute osseous abnormality. IMPRESSION: No acute cardiopulmonary findings. Electronically Signed   By: Hart Robinsons M.D.   On: 05/03/2023 16:34    Procedures Procedures    Medications Ordered in ED Medications  ketorolac (TORADOL) 15 MG/ML injection 15 mg (15 mg Intramuscular Given 05/03/23 1819)    ED Course/ Medical Decision Making/ A&P                                 Medical Decision Making Amount and/or Complexity of Data Reviewed Labs: ordered. Radiology: ordered.  Risk Prescription drug management.   Medical Decision Making:   Savoy Somerville is a 68 y.o. male who presented to the ED today with cough, myalgias, chest pain.  Vital signs reviewed.  On exam he is quite anxious, he has frequent cough which she states has been ongoing for some time, at least a year.  EKG without signs of acute ischemia, troponin initially normal will trend.  He reports concern for chronic benzodiazepine toxicity and withdrawal, states he has not used any drugs or alcohol recently.  He does not clinically appear to be withdrawing.  I reviewed his chest x-ray, I do not see any acute findings including pneumonia.  I have low suspicion for ACS, dissection, PE.  He has been evaluated in the ED for similar body aches and chronic cough recently and was discharged with mental health resources.  He is quite anxious, but does not appear to be responding to internal stimuli and I do not think he needs IVC or emergent psychiatric evaluation.   Patient placed on continuous vitals and telemetry monitoring while in ED which was reviewed periodically.  Reviewed and confirmed nursing documentation for past medical history, family history, social history.  Reassessment and Plan:   On my reevaluation when I went in the room he was sleeping comfortably without cough, dyspnea.  When he woke up, he  started coughing consistently again.  Again expressed concern for benzodiazepine persistent withdrawal.  He has no symptoms of withdrawal.  His troponin is negative x 2 have low concern for ACS.  Presentation seems a consistent PE, dissection and symptoms been ongoing for years per patient.  I think there may be some psychiatric component but I do not think there is an indication for emergent psychiatric evaluation or IVC.  I recommend he  follow close with a primary care doctor.   Patient's presentation is most consistent with acute complicated illness / injury requiring diagnostic workup.           Final Clinical Impression(s) / ED Diagnoses Final diagnoses:  Chest pain, unspecified type  Chronic cough    Rx / DC Orders ED Discharge Orders     None         Laurence Spates, MD 05/03/23 2010

## 2023-05-05 ENCOUNTER — Telehealth (HOSPITAL_BASED_OUTPATIENT_CLINIC_OR_DEPARTMENT_OTHER): Payer: Self-pay | Admitting: *Deleted

## 2023-05-05 ENCOUNTER — Ambulatory Visit (HOSPITAL_BASED_OUTPATIENT_CLINIC_OR_DEPARTMENT_OTHER): Payer: Self-pay | Admitting: Family Medicine

## 2023-05-05 NOTE — Telephone Encounter (Signed)
Pt called after hours line asking the provider to call poison control [phone] to get the name of the test that is needed to assist him in finding out what is wrong with him and his breathing called pt back and asked him to call poison control and ask them what test they are talking about and we would see if we can get that ordered for patient

## 2023-05-05 NOTE — Telephone Encounter (Signed)
Copied from CRM [id number]. Topic: Clinical - Red Word Triage >> May 05, 2023  9:21 AM [name] wrote: Red Word that prompted transfer to Nurse Triage: trouble breathing, shortness of breath.  Chief Complaint: cp and difficulty breathing Symptoms: cough, anxious, cp, sob Frequency: constant Pertinent Negatives: Patient denies fever, weakness Disposition: [] ED /[] Urgent Care (no appt availability in office) / [] Appointment(In office/virtual)/ []  [facility]/ [] Home Care/ [] Refused Recommended Disposition /[] [facility]/ [x]  Follow-up with PCP Additional Notes: Patient very anxious, talking quickly re: all medications make him have pain.  Very difficult to redirect.  States was in ER over weekend and they stated, "We can't do anything for you."  Patient states his "wheezing is in his head"  Attempted to make apt and patient declined.  States he can't get there.  Instructed to go to er if becomes worse.   Reason for Disposition  [1] MODERATE longstanding difficulty breathing (e.g., speaks in phrases, SOB even at rest, pulse 100-120) AND [2] SAME as normal  Answer Assessment - Initial Assessment Questions 1. RESPIRATORY STATUS: "Describe your breathing?" (e.g., wheezing, shortness of breath, unable to speak, severe coughing)      "Wheezing from my head" 2. ONSET: "When did this breathing problem begin?"      For the last year and a half 3. PATTERN "Does the difficult breathing come and go, or has it been constant since it started?"      constant 4. SEVERITY: "How bad is your breathing?" (e.g., mild, moderate, severe)    - MILD: No SOB at rest, mild SOB with walking, speaks normally in sentences, can lie down, no retractions, pulse < 100.    - MODERATE: SOB at rest, SOB with minimal exertion and prefers to sit, cannot lie down flat, speaks in phrases, mild retractions, audible wheezing, pulse 100-120.    - SEVERE: Very SOB at rest, speaks in single words, struggling to  breathe, sitting hunched forward, retractions, pulse > 120      moderate 5. RECURRENT SYMPTOM: "Have you had difficulty breathing before?" If Yes, ask: "When was the last time?" and "What happened that time?"      NA 6. CARDIAC HISTORY: "Do you have any history of heart disease?" (e.g., heart attack, angina, bypass surgery, angioplasty)      denies 7. LUNG HISTORY: "Do you have any history of lung disease?"  (e.g., pulmonary embolus, asthma, emphysema)     denies 8. CAUSE: "What do you think is causing the breathing problem?"      unknown 9. OTHER SYMPTOMS: "Do you have any other symptoms? (e.g., dizziness, runny nose, cough, chest pain, fever)     Cough, dizziness, runny nose,  Protocols used: Breathing Difficulty-A-AH

## 2023-05-07 DIAGNOSIS — B182 Chronic viral hepatitis C: Secondary | ICD-10-CM | POA: Insufficient documentation

## 2023-05-13 ENCOUNTER — Ambulatory Visit (HOSPITAL_COMMUNITY)
Admission: EM | Admit: 2023-05-13 | Discharge: 2023-05-13 | Disposition: A | Discharge status: Other Acute Inpatient Hospital | Payer: 59

## 2023-05-13 ENCOUNTER — Other Ambulatory Visit: Payer: Self-pay

## 2023-05-13 ENCOUNTER — Emergency Department (HOSPITAL_COMMUNITY)
Admission: EM | Admit: 2023-05-13 | Discharge: 2023-05-14 | Disposition: A | Discharge status: Home / Self Care | Payer: 59 | Attending: Emergency Medicine | Admitting: Emergency Medicine

## 2023-05-13 ENCOUNTER — Emergency Department (HOSPITAL_COMMUNITY): Payer: 59

## 2023-05-13 ENCOUNTER — Encounter (HOSPITAL_COMMUNITY): Payer: Self-pay

## 2023-05-13 DIAGNOSIS — G8929 Other chronic pain: Secondary | ICD-10-CM | POA: Diagnosis not present

## 2023-05-13 DIAGNOSIS — Z9151 Personal history of suicidal behavior: Secondary | ICD-10-CM | POA: Diagnosis not present

## 2023-05-13 DIAGNOSIS — R0602 Shortness of breath: Secondary | ICD-10-CM | POA: Diagnosis present

## 2023-05-13 DIAGNOSIS — F333 Major depressive disorder, recurrent, severe with psychotic symptoms: Secondary | ICD-10-CM | POA: Diagnosis not present

## 2023-05-13 DIAGNOSIS — Z20822 Contact with and (suspected) exposure to covid-19: Secondary | ICD-10-CM | POA: Diagnosis not present

## 2023-05-13 DIAGNOSIS — R079 Chest pain, unspecified: Secondary | ICD-10-CM | POA: Diagnosis not present

## 2023-05-13 DIAGNOSIS — T465X6A Underdosing of other antihypertensive drugs, initial encounter: Secondary | ICD-10-CM | POA: Diagnosis not present

## 2023-05-13 DIAGNOSIS — R45851 Suicidal ideations: Secondary | ICD-10-CM | POA: Diagnosis present

## 2023-05-13 DIAGNOSIS — F411 Generalized anxiety disorder: Secondary | ICD-10-CM | POA: Diagnosis present

## 2023-05-13 LAB — CBC WITH DIFFERENTIAL/PLATELET
Abs Immature Granulocytes: 0.02 10*3/uL (ref 0.00–0.07)
Basophils Absolute: 0.1 10*3/uL (ref 0.0–0.1)
Basophils Relative: 1 %
Eosinophils Absolute: 0.1 10*3/uL (ref 0.0–0.5)
Eosinophils Relative: 1 %
HCT: 45.4 % (ref 39.0–52.0)
Hemoglobin: 15.3 g/dL (ref 13.0–17.0)
Immature Granulocytes: 0 %
Lymphocytes Relative: 24 %
Lymphs Abs: 2.3 10*3/uL (ref 0.7–4.0)
MCH: 27.1 pg (ref 26.0–34.0)
MCHC: 33.7 g/dL (ref 30.0–36.0)
MCV: 80.4 fL (ref 80.0–100.0)
Monocytes Absolute: 1 10*3/uL (ref 0.1–1.0)
Monocytes Relative: 10 %
Neutro Abs: 6.5 10*3/uL (ref 1.7–7.7)
Neutrophils Relative %: 64 %
Platelets: 282 10*3/uL (ref 150–400)
RBC: 5.65 MIL/uL (ref 4.22–5.81)
RDW: 13.6 % (ref 11.5–15.5)
WBC: 9.9 10*3/uL (ref 4.0–10.5)
nRBC: 0 % (ref 0.0–0.2)

## 2023-05-13 LAB — RESP PANEL BY RT-PCR (RSV, FLU A&B, COVID)  RVPGX2
Influenza A by PCR: NEGATIVE
Influenza B by PCR: NEGATIVE
Resp Syncytial Virus by PCR: NEGATIVE
SARS Coronavirus 2 by RT PCR: NEGATIVE

## 2023-05-13 LAB — COMPREHENSIVE METABOLIC PANEL
ALT: 30 U/L (ref 0–44)
AST: 36 U/L (ref 15–41)
Albumin: 4.4 g/dL (ref 3.5–5.0)
Alkaline Phosphatase: 70 U/L (ref 38–126)
Anion gap: 11 (ref 5–15)
BUN: 21 mg/dL (ref 8–23)
CO2: 20 mmol/L — ABNORMAL LOW (ref 22–32)
Calcium: 9.6 mg/dL (ref 8.9–10.3)
Chloride: 106 mmol/L (ref 98–111)
Creatinine, Ser: 1.02 mg/dL (ref 0.61–1.24)
GFR, Estimated: >60 mL/min (ref >60)
Glucose, Bld: 109 mg/dL — ABNORMAL HIGH (ref 70–99)
Potassium: 4.2 mmol/L (ref 3.5–5.1)
Sodium: 137 mmol/L (ref 135–145)
Total Bilirubin: 1 mg/dL (ref 0.0–1.2)
Total Protein: 7.1 g/dL (ref 6.5–8.1)

## 2023-05-13 LAB — RAPID URINE DRUG SCREEN, HOSP PERFORMED
Amphetamines: NOT DETECTED
Barbiturates: NOT DETECTED
Benzodiazepines: NOT DETECTED
Cocaine: NOT DETECTED
Opiates: NOT DETECTED
Tetrahydrocannabinol: POSITIVE — AB

## 2023-05-13 LAB — D-DIMER, QUANTITATIVE: D-Dimer, Quant: <0.27 ug{FEU}/mL (ref 0.00–0.50)

## 2023-05-13 LAB — TROPONIN I (HIGH SENSITIVITY)
Troponin I (High Sensitivity): 5 ng/L (ref <18)
Troponin I (High Sensitivity): 5 ng/L (ref <18)

## 2023-05-13 LAB — ETHANOL: Alcohol, Ethyl (B): <10 mg/dL (ref <10)

## 2023-05-13 NOTE — Progress Notes (Signed)
   05/13/23 1823  [facility] Triage Screening (Walk-ins at [facility] only)  How Did You Hear About Us ? Self  What Is the Reason for Your Visit/Call Today? [name] presents to [facility] voluntarily unaccompanied. Pt states that he has been having thoughts of wanting to kill himself because of the chronic pain; however, he is a Christian. Pt currently denies SI, HI, AVH and alcohol/drug use.  How Long Has This Been Causing You Problems? > than 6 months  Have You Recently Had Any Thoughts About Hurting Yourself? Yes  Are You Planning to Commit Suicide/Harm Yourself At This time? No  Have you Recently Had Thoughts About Hurting Someone [name]? No  Are You Planning To Harm Someone At This Time? No  Physical Abuse Denies  Verbal Abuse Denies  Sexual Abuse Denies  Exploitation of patient/patient's resources Denies  Self-Neglect Denies  Are you currently experiencing any auditory, visual or other hallucinations? No  Have You Used Any Alcohol or Drugs in the Past 24 Hours? No  Do you have any current medical co-morbidities that require immediate attention? No  Clinician description of patient physical appearance/behavior: restless, pacing and chest pains  What Do You Feel Would Help You the Most Today? Medication(s)  If access to [facility]gent [facility] was not available, would you have sought care in the Emergency Department? No  Determination of Need Routine (7 days)  Options For Referral ED Referral

## 2023-05-13 NOTE — ED Provider Notes (Signed)
 Behavioral Health Urgent Care Medical Screening Exam  Patient Name: [name] MRN: [id number] Date of Evaluation: 05/13/23 Chief Complaint:  suicidal ideation Diagnosis:  Final diagnoses:  Suicidal ideation  Chest pain, unspecified type    History of Present illness: [name] is a 68 y.o. male with a history of hypertension, hyperlipidemia, substance use, alcohol use presenting voluntarily to [facility].  Patient reports that he has having some chest pain that he describes as burning sensation and he is having all over generalized pain.  Patient reports that he presented to [facility] because he does not know what to do about the pain and it seems to be worse. Patient states that he has some suicidal ideation but states he does not have a plan or intent and does not want to harm himself.   Nurse practitioner assessed patient face-to-face and reviewed his chart.  Patient is alert oriented x 4, calm and cooperative, speech is clear and coherent, mood is euthymic with congruent affect, endorsed SI but states it is because of unrelieved pain. Patient  denies having AVH/HI. Patient does not appear to be responding to any internal or external stimuli paranoia, delusions or mania.   Patient reports that due to the severity of his pain is the reason why he is feeling suicidal. Patient reports that he used to take benzodiazepines years ago and feels that he some toxicity related to him taking the benzodiazepines. Patient states that he has been to many doctors and have been told the pain is in his head. Patient reports that he needs some relief from this pain that he has been having. Patient will be sent out to [facility] ED for medical clearance, Dr. Dreama is the accepting provider. Once patient is medically cleared he may return to [facility].  Flowsheet Row ED from 05/13/2023 in [facility] from 05/03/2023 in [facility] Emergency Department at [facility] ED from  04/22/2023 in [facility] Emergency Department at [facility]  C-SSRS RISK CATEGORY Low Risk No Risk No Risk       Psychiatric Specialty Exam  Presentation  General Appearance:Casual  Eye Contact:Fair  Speech:Clear and Coherent  Speech Volume:Increased  Handedness:Right   Mood and Affect  Mood: Anxious; Depressed  Affect: Congruent   Thought Process  Thought Processes: Coherent  Descriptions of Associations:Intact  Orientation:Full (Time, Place and Person)  Thought Content:Logical  Diagnosis of Schizophrenia or Schizoaffective disorder in past: No data recorded  Hallucinations:None  Ideas of Reference:None  Suicidal Thoughts:Yes, Passive Without Intent; Without Plan  Homicidal Thoughts:No   Sensorium  Memory: Immediate Fair; Recent Fair; Remote Fair  Judgment: Fair  Insight: Fair   Chartered Certified Accountant: Fair  Attention Span: Fair  Recall: [employer] of Knowledge: Fair  Language: Fair   Psychomotor Activity  Psychomotor Activity: Normal   Assets  Assets: Manufacturing Systems Engineer; Housing; Resilience   Sleep  Sleep: Poor  Number of hours:  4   Physical Exam: Physical Exam HENT:     Head: Normocephalic.     Nose: Nose normal.  Eyes:     Pupils: Pupils are equal, round, and reactive to light.  Cardiovascular:     Rate and Rhythm: Tachycardia present.  Pulmonary:     Effort: Pulmonary effort is normal.  Abdominal:     General: Abdomen is flat.  Musculoskeletal:        General: Normal range of motion.     Cervical back: Normal range  of motion.  Skin:    General: Skin is warm.  Neurological:     Mental Status: He is alert and oriented to person, place, and time.  Psychiatric:        Attention and Perception: Attention normal.        Mood and Affect: Mood is depressed.        Speech: Speech is rapid and pressured.        Behavior: Behavior is cooperative.        Thought Content: Thought  content includes suicidal ideation.        Cognition and Memory: Cognition normal.        Judgment: Judgment is impulsive.    Review of Systems  Constitutional: Negative.   Eyes: Negative.   Respiratory:  Positive for cough.   Cardiovascular:  Positive for chest pain.  Gastrointestinal: Negative.   Genitourinary: Negative.   Musculoskeletal: Negative.   Skin: Negative.   Neurological: Negative.   Endo/Heme/Allergies: Negative.   Psychiatric/Behavioral:  Positive for suicidal ideas.    Blood pressure (!) 168/118, pulse (!) 120, temperature 98.2 F (36.8 C), temperature source Oral, resp. rate 18, SpO2 96%. There is no height or weight on file to calculate BMI.  Musculoskeletal: Strength & Muscle Tone: within normal limits Gait & Station: normal Patient leans: N/A   [facility] MSE Discharge Disposition for Follow up and Recommendations: Based on my evaluation the patient appears to have an emergency medical condition for which I recommend the patient be transferred to the emergency department for further evaluation.  Patient will be sent out to [facility] ED for medical clearance and once medically cleared may return to [facility].   Ashritha Desrosiers E Brock Larmon, NP 05/13/2023, 7:28 PM

## 2023-05-13 NOTE — ED Triage Notes (Signed)
Patient via [facility] as tx from [facility]. Sent her for eval of tachypnea and hypertension. Lower lung fields diminished. Possibly had pna 1 week prior. Originally presenting to [facility] with c/o of facial pain. BP 160/120.

## 2023-05-13 NOTE — Discharge Instructions (Signed)
Please follow up with your doctor to continue investigating the cause of your ongoing chest pain.   And please return to [facility] as planned if mental health support would be helpful.

## 2023-05-13 NOTE — ED Provider Notes (Signed)
 [place] EMERGENCY DEPARTMENT AT [facility] Provider Note   CSN: [id number] Arrival date & time: 05/13/23  2007     History  Chief Complaint  Patient presents with   Shortness of Breath   Hypertension    [name] is a 68 y.o. male.  Patient to ED with c/o chest pain for the past >1 year. He is here from [facility] where he was evaluated for possible SI stating he can't deal with the pain any longer. No fever. He reports being diagnosed with pneumonia one week ago but did not take the antibiotic. He states he has been given several medications to relieve his symptoms but that it seems to him that each medication makes his pain worse. No vomiting.   The history is provided by the patient. No language interpreter was used.  Shortness of Breath Hypertension Associated symptoms include shortness of breath.       Home Medications Prior to Admission medications   Not on File      Allergies    Diazepam, Morphine  and codeine, and Gabapentin     Review of Systems   Review of Systems  Respiratory:  Positive for shortness of breath.     Physical Exam Updated Vital Signs BP (!) 173/109   Pulse 100   Temp 98.7 F (37.1 C) (Oral)   Resp (!) 22   Ht 5' 7 (1.702 m)   Wt 69.9 kg   SpO2 98%   BMI 24.12 kg/m  Physical Exam Vitals and nursing note reviewed.  Constitutional:      Appearance: He is well-developed.  HENT:     Head: Normocephalic.  Cardiovascular:     Rate and Rhythm: Normal rate and regular rhythm.     Heart sounds: No murmur heard. Pulmonary:     Breath sounds: Normal breath sounds. No wheezing, rhonchi or rales.     Comments: Short, frequent respirations - splinting. Breath sounds clear.  Abdominal:     General: Bowel sounds are normal.     Palpations: Abdomen is soft.     Tenderness: There is no abdominal tenderness. There is no guarding or rebound.  Musculoskeletal:        General: Normal range of motion.     Cervical back: Normal range of  motion and neck supple.  Skin:    General: Skin is warm and dry.  Neurological:     General: No focal deficit present.     Mental Status: He is alert and oriented to person, place, and time.     ED Results / Procedures / Treatments   Labs (all labs ordered are listed, but only abnormal results are displayed) Labs Reviewed  COMPREHENSIVE METABOLIC PANEL - Abnormal; Notable for the following components:      Result Value   CO2 20 (*)    Glucose, Bld 109 (*)    All other components within normal limits  RAPID URINE DRUG SCREEN, HOSP PERFORMED - Abnormal; Notable for the following components:   Tetrahydrocannabinol POSITIVE (*)    All other components within normal limits  RESP PANEL BY RT-PCR (RSV, FLU A&B, COVID)  RVPGX2  CBC WITH DIFFERENTIAL/PLATELET  D-DIMER, QUANTITATIVE  ETHANOL  TROPONIN I (HIGH SENSITIVITY)  TROPONIN I (HIGH SENSITIVITY)   Results for orders placed or performed during the hospital encounter of 05/13/23  CBC with Differential   Collection Time: 05/13/23  8:27 PM  Result Value Ref Range   WBC 9.9 4.0 - 10.5 K/uL   RBC 5.65  4.22 - 5.81 MIL/uL   Hemoglobin 15.3 13.0 - 17.0 g/dL   HCT 54.5 60.9 - 47.9 %   MCV 80.4 80.0 - 100.0 fL   MCH 27.1 26.0 - 34.0 pg   MCHC 33.7 30.0 - 36.0 g/dL   RDW 86.3 88.4 - 84.4 %   Platelets 282 150 - 400 K/uL   nRBC 0.0 0.0 - 0.2 %   Neutrophils Relative % 64 %   Neutro Abs 6.5 1.7 - 7.7 K/uL   Lymphocytes Relative 24 %   Lymphs Abs 2.3 0.7 - 4.0 K/uL   Monocytes Relative 10 %   Monocytes Absolute 1.0 0.1 - 1.0 K/uL   Eosinophils Relative 1 %   Eosinophils Absolute 0.1 0.0 - 0.5 K/uL   Basophils Relative 1 %   Basophils Absolute 0.1 0.0 - 0.1 K/uL   Immature Granulocytes 0 %   Abs Immature Granulocytes 0.02 0.00 - 0.07 K/uL  Comprehensive metabolic panel   Collection Time: 05/13/23  8:27 PM  Result Value Ref Range   Sodium 137 135 - 145 mmol/L   Potassium 4.2 3.5 - 5.1 mmol/L   Chloride 106 98 - 111 mmol/L    CO2 20 (L) 22 - 32 mmol/L   Glucose, Bld 109 (H) 70 - 99 mg/dL   BUN 21 8 - 23 mg/dL   Creatinine, Ser 8.97 0.61 - 1.24 mg/dL   Calcium 9.6 8.9 - 89.6 mg/dL   Total Protein 7.1 6.5 - 8.1 g/dL   Albumin 4.4 3.5 - 5.0 g/dL   AST 36 15 - 41 U/L   ALT 30 0 - 44 U/L   Alkaline Phosphatase 70 38 - 126 U/L   Total Bilirubin 1.0 0.0 - 1.2 mg/dL   GFR, Estimated >39 >39 mL/min   Anion gap 11 5 - 15  D-dimer, quantitative   Collection Time: 05/13/23  8:27 PM  Result Value Ref Range   D-Dimer, Quant <0.27 0.00 - 0.50 ug/mL-FEU  Troponin I (High Sensitivity)   Collection Time: 05/13/23  8:27 PM  Result Value Ref Range   Troponin I (High Sensitivity) 5 <18 ng/L  Resp panel by RT-PCR (RSV, Flu A&B, Covid) Anterior Nasal Swab   Collection Time: 05/13/23  8:42 PM   Specimen: Anterior Nasal Swab  Result Value Ref Range   SARS Coronavirus 2 by RT PCR NEGATIVE NEGATIVE   Influenza A by PCR NEGATIVE NEGATIVE   Influenza B by PCR NEGATIVE NEGATIVE   Resp Syncytial Virus by PCR NEGATIVE NEGATIVE  Ethanol   Collection Time: 05/13/23  8:48 PM  Result Value Ref Range   Alcohol, Ethyl (B) <10 <10 mg/dL  Urine rapid drug screen (hosp performed)   Collection Time: 05/13/23  9:04 PM  Result Value Ref Range   Opiates NONE DETECTED NONE DETECTED   Cocaine NONE DETECTED NONE DETECTED   Benzodiazepines NONE DETECTED NONE DETECTED   Amphetamines NONE DETECTED NONE DETECTED   Tetrahydrocannabinol POSITIVE (A) NONE DETECTED   Barbiturates NONE DETECTED NONE DETECTED  Troponin I (High Sensitivity)   Collection Time: 05/13/23 10:34 PM  Result Value Ref Range   Troponin I (High Sensitivity) 5 <18 ng/L     EKG None  Radiology DG Chest 2 View Result Date: 05/13/2023 CLINICAL DATA:  Chest pain EXAM: CHEST - 2 VIEW COMPARISON:  05/03/2023 FINDINGS: The heart size and mediastinal contours are within normal limits. Both lungs are clear. The visualized skeletal structures are unremarkable. IMPRESSION: No  active cardiopulmonary disease. Electronically Signed  By: Franky Stanford M.D.   On: 05/13/2023 21:21    Procedures Procedures    Medications Ordered in ED Medications - No data to display  ED Course/ Medical Decision Making/ A&P                                 Medical Decision Making Patient to ED with chronic chest pain for >1 year.   On initial exam, the patient appeared uncomfortable, sitting up, splinting respirations. On multiple re-evaluations, he appears much more comfortable (without intervention), up to the bathroom, resting.   Labs:  UDS +TCN D-dimer <.27 Viral panel is negative No WBC, normal hgb No significant electrolyte abnormalities  CXR - clear, no pneumonia or edema.   He is felt appropriate for discharge home. He is not felt to be a suicide risk after being evaluated by [facility] prior to arrival and remains voluntary status. This was confirmed with [facility] staff. When asked, the patient states he would like to be transported back to [facility] to continue assessment and treatment for depression, passive SI.   Amount and/or Complexity of Data Reviewed Labs: ordered. Radiology: ordered.           Final Clinical Impression(s) / ED Diagnoses Final diagnoses:  Chronic chest pain    Rx / DC Orders ED Discharge Orders     None         [name] 05/13/23 2358    Laurice Maude BROCKS, MD 05/16/23 [phone]

## 2023-05-13 NOTE — ED Notes (Addendum)
Pt BP remains elevated, S. Uphill PA notified. Pt cleared for D/C

## 2023-05-14 ENCOUNTER — Ambulatory Visit (INDEPENDENT_AMBULATORY_CARE_PROVIDER_SITE_OTHER)
Admission: EM | Admit: 2023-05-14 | Discharge: 2023-05-14 | Disposition: A | Discharge status: Home / Self Care | Payer: 59 | Source: Home / Self Care

## 2023-05-14 ENCOUNTER — Ambulatory Visit (INDEPENDENT_AMBULATORY_CARE_PROVIDER_SITE_OTHER)
Admission: EM | Admit: 2023-05-14 | Discharge: 2023-05-15 | Disposition: A | Discharge status: Other Acute Inpatient Hospital | Payer: 59 | Source: Home / Self Care

## 2023-05-14 DIAGNOSIS — F333 Major depressive disorder, recurrent, severe with psychotic symptoms: Secondary | ICD-10-CM | POA: Insufficient documentation

## 2023-05-14 DIAGNOSIS — T465X6A Underdosing of other antihypertensive drugs, initial encounter: Secondary | ICD-10-CM | POA: Insufficient documentation

## 2023-05-14 DIAGNOSIS — R45851 Suicidal ideations: Secondary | ICD-10-CM | POA: Insufficient documentation

## 2023-05-14 DIAGNOSIS — G8929 Other chronic pain: Secondary | ICD-10-CM | POA: Insufficient documentation

## 2023-05-14 DIAGNOSIS — Z9151 Personal history of suicidal behavior: Secondary | ICD-10-CM | POA: Insufficient documentation

## 2023-05-14 DIAGNOSIS — F411 Generalized anxiety disorder: Secondary | ICD-10-CM | POA: Insufficient documentation

## 2023-05-14 MED ORDER — QUETIAPINE FUMARATE 25 MG PO TABS: 25.0000 mg | ORAL_TABLET | Freq: Every evening | ORAL | Status: DC | PRN

## 2023-05-14 MED ORDER — MAGNESIUM HYDROXIDE 400 MG/5ML PO SUSP: 30.0000 mL | Freq: Every day | ORAL | Status: DC | PRN

## 2023-05-14 MED ORDER — DULOXETINE HCL 30 MG PO CPEP: 30.0000 mg | ORAL_CAPSULE | Freq: Every day | ORAL | Status: DC

## 2023-05-14 MED ORDER — ACETAMINOPHEN 325 MG PO TABS
650.0000 mg | ORAL_TABLET | Freq: Four times a day (QID) | ORAL | Status: DC | PRN
  Administered 2023-05-14 – 2023-05-15 (×2): 650 mg via ORAL
  Filled 2023-05-14 (×2): qty 2

## 2023-05-14 MED ORDER — ALUM & MAG HYDROXIDE-SIMETH 200-200-20 MG/5ML PO SUSP: 30.0000 mL | ORAL | Status: DC | PRN

## 2023-05-14 MED ORDER — ACETAMINOPHEN 325 MG PO TABS: 650.0000 mg | ORAL_TABLET | Freq: Four times a day (QID) | ORAL | 0 refills | Status: DC | PRN

## 2023-05-14 MED ORDER — DULOXETINE HCL 30 MG PO CPEP
30.0000 mg | ORAL_CAPSULE | Freq: Every day | ORAL | Status: DC
  Filled 2023-05-14 (×2): qty 1

## 2023-05-14 NOTE — ED Provider Notes (Signed)
 Behavioral Health Urgent Care Medical Screening Exam  Patient Name: [name] MRN: [id number] Date of Evaluation: 05/14/23 Chief Complaint:   Diagnosis:  Final diagnoses:  Anxiety state    History of Present illness: [name] is a 68 y.o. male. [name] 68 y.o., male patient presented to [facility] as a voluntary walk in unaccompanied with complaints of chronic pain.  [name], is seen face to face by this provider, consulted with Dr. Zouev; and chart reviewed on 05/14/23.  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 States he used benzodiazepines for years and when he stopped using the benzos the pain began gradually but has continued to get gradually worse. The patient states that he cannot us  any  type of medication now as it causes pain. Patient states that his doctor wants him to go a pain clinic but he doesn't want to go. States he does get out a lot to talk to women and asked this clinical research associate if she could talk with him all night then he will go home to sleep and because he has appointment tomorrow with his church outreach that he doesn't want to miss. Patient states he likes coming here and will come back again to talk tomorrow. The patient denies any current drug use nor does he endorse taking any medication. The patient said he have blood pressure medication but he is has not been taking that either, The patient states he is not suicidal, homicidal nor is he experiencing AVH.  During evaluation [name] is sitting  in no acute distress.  He is alert & oriented x 4, calm, cooperative and attentive for this assessment.  His mood is euthymic with congruent affect.  He has normal speech which becomes elevated when he talks about his pain and that the doctors can't help. His behavior is appropriate..  Objectively there is no evidence of psychosis/mania or delusional thinking. Pt does not appear to be responding to internal or external  stimuli.  Patient is able to converse coherently, goal directed thoughts, no distractibility, however patient does have pre-occupation concerning his pain.  He also denies suicidal/self-harm/homicidal ideation, psychosis, and paranoia.  Patient answered question appropriately.    Flowsheet Row ED from 05/14/2023 in [facility] Most recent reading at 05/14/2023  2:10 AM ED from 05/13/2023 in [facility] Emergency Department at [facility] Most recent reading at 05/13/2023  8:42 PM ED from 05/13/2023 in [facility] Most recent reading at 05/13/2023  7:04 PM  C-SSRS RISK CATEGORY Low Risk Low Risk Low Risk       Psychiatric Specialty Exam  Presentation  General Appearance:Appropriate for Environment  Eye Contact:Good  Speech:Clear and Coherent  Speech Volume:Normal  Handedness:Right   Mood and Affect  Mood: Anxious  Affect: Congruent   Thought Process  Thought Processes: Coherent  Descriptions of Associations:Intact  Orientation:Full (Time, Place and Person)  Thought Content:Logical  Diagnosis of Schizophrenia or Schizoaffective disorder in past: No data recorded  Hallucinations:None  Ideas of Reference:None  Suicidal Thoughts:No Without Intent; Without Plan  Homicidal Thoughts:No   Sensorium  Memory: Immediate Fair; Recent Fair  Judgment: Fair  Insight: Fair   Chartered Certified Accountant: Fair  Attention Span: Fair  Recall: [employer] of Knowledge: Fair  Language: Fair   Psychomotor Activity  Psychomotor Activity: Normal   Assets  Assets: Manufacturing Systems Engineer; Desire for Improvement; Resilience   Sleep  Sleep: Poor  Number of hours:  4   Physical Exam: Physical Exam HENT:     Head: Normocephalic.     Nose: Nose normal.     Mouth/Throat:     Mouth: Mucous membranes are moist.  Eyes:     Extraocular Movements: Extraocular movements intact.   Cardiovascular:     Rate and Rhythm: Tachycardia present.  Pulmonary:     Breath sounds: Normal breath sounds.  Musculoskeletal:     Right hand: Deformity present.     Left hand: Deformity present.     Cervical back: Normal range of motion.  Neurological:     Mental Status: He is alert.   Review of Systems  Constitutional: Negative.   HENT: Negative.    Eyes: Negative.   Respiratory:  Positive for cough.   Cardiovascular: Negative.   Gastrointestinal: Negative.   Genitourinary: Negative.   Musculoskeletal: Negative.   Skin: Negative.   Psychiatric/Behavioral:  The patient is nervous/anxious.    Blood pressure (!) 139/105, pulse (!) 106, temperature 98.1 F (36.7 C), temperature source Oral, resp. rate 20, SpO2 96%. There is no height or weight on file to calculate BMI.  Musculoskeletal: Strength & Muscle Tone: within normal limits Gait & Station: normal Patient leans: N/A   [facility] MSE Discharge Disposition for Follow up and Recommendations: Based on my evaluation the patient does not appear to have an emergency medical condition and can be discharged with resources and follow up care in outpatient services for Individual Therapy and Group Therapy   Cato Liburd, NP 05/14/2023, 3:21 AM

## 2023-05-14 NOTE — Progress Notes (Signed)
 Pt was accepted to [facility]  05/14/2023 Bed Assignment L33  PENDING Signed Voluntary consent uploaded to chart or faxed to [facility] Kathrine DAVENPORT Number [phone] Dx MDD Address: [address] [place], [place], [place] [address]  Pt meets inpatient criteria per Majel Orlie BRISTLE  Attending Physician will be Dr. Allyn Donnelly COME  Report can be called to: [phone]  Pt can arrive after: [facility] RN to coordinate with care team.  Care Team notified: Day [facility] Cherylynn Ernst, RN, Night Kim Brooks,RN, Baker Ezzard OBIE [place] Trudy CARROLYN, Whitney Coeburn, Takia Starkes-Perrry,FNP, Curtistine Cave  Mitzie GEANNIE Pinal, MSW, [facility] 05/14/2023 9:03 PM

## 2023-05-14 NOTE — Progress Notes (Signed)
   05/14/23 1226  [facility] Triage Screening (Walk-ins at [facility] only)  How Did You Hear About Us ? Self  What Is the Reason for Your Visit/Call Today? [name] is a 68 year old male presenting to [facility] unaccompanied. Pt reports he was here last night for the same issue. Pt reports he is in so much pain and he has thoughts of wanting to harm himself. Pt reports he has a plan to end his life if he does not get his pain to go away. However, pt reports he does not want to end his life at this present moment but will eventually if he does not get this pain to stop. Pt reports he has tried 6-7 meds to help with his pain, but nothing has helped. Pt denies substance use, Hi and Avh at this time.  How Long Has This Been Causing You Problems? <Week  Have You Recently Had Any Thoughts About Hurting Yourself? Yes  How long ago did you have thoughts about hurting yourself? today  Are You Planning to Commit Suicide/Harm Yourself At This time? No  Have you Recently Had Thoughts About Hurting Someone [name]? No  Are You Planning To Harm Someone At This Time? No  Physical Abuse Denies  Verbal Abuse Denies  Sexual Abuse Denies  Exploitation of patient/patient's resources Denies  Self-Neglect Denies  Possible abuse reported to: Other (Comment)  Are you currently experiencing any auditory, visual or other hallucinations? No  Have You Used Any Alcohol or Drugs in the Past 24 Hours? No  Do you have any current medical co-morbidities that require immediate attention? No  Clinician description of patient physical appearance/behavior: calm, cooperative  If access to [facility] Urgent [facility] was not available, would you have sought care in the Emergency Department? No  Determination of Need Routine (7 days)  Options For Referral Medication Management

## 2023-05-14 NOTE — Group Note (Signed)
 Date:  05/14/2023 Time:  8:57 PM  Group Topic/Focus:  Self Care:   The focus of this group is to help patients understand the importance of self-care in order to improve or restore emotional, physical, spiritual, interpersonal, and financial health.    Participation Level:  Did Not Attend  Participation Quality:   Did Not Attend  Affect:   Did Not Attend  Cognitive:   Did Not Attend  Insight: None  Engagement in Group:  None  Modes of Intervention:   Did Not Attend  Additional Comments:  Pt not yet on unit  Weston Fulco T Yandiel Bergum 05/14/2023, 8:57 PM

## 2023-05-14 NOTE — Progress Notes (Signed)
 [facility] Pulmonary Medicine Outpatient Clinic  New Patient Consultation  Primary Care Physician: Leita VEAR Blind, MD Provider requesting consultation: No ref. provider found  Dear No ref. provider found,   Thank you for the consultation request for the patient's problem of dyspnea and GERD.  Below is the documentation for today's visit.  Subjective:  History of Present Illness:  Consent:  Today's visit was completed via a real-time telehealth (see specific modality noted below). The patient/authorized person provided oral consent at the time of the visit to engage in a telemedicine encounter with the present provider at [facility]. The patient/authorized person was informed of the potential benefits, limitations, and risks of telemedicine. The patient/authorized person expressed understanding that the laws that protect confidentiality also apply to telemedicine. The patient/authorized person acknowledged understanding that telemedicine does not provide emergency services and that he or she would need to call 911 or proceed to the nearest hospital for help if such a need arose.  Total time spent in direct clinical discussion with patient 42 min. I spent greater than 70% of the time noted above in counseling the patient and coordination of care  In addition to the time allotted above I spent 20 minutes in pre-charting and in documentation Telehealth Modality: video     [name] is a 68 y.o. year-old man with a past medical history of chronic pain syndrome, anxiety, GERD, who presents for evaluation of dyspnea and cough.  No pulmonary function testing is available.  The patient's most recent imaging includes a chest x-ray from February 2022 that is within normal limits. Patient worries about brain dysfunction. Feels like he has gravel in his throat.  Clears throat constantly throughout  the visit.  Makes a chronic grunting noise.  The patient complains of  progressive wheeze X months, DOE (feels like he is only getting 1/2 the air in), only able to climb one flight, and chest pain, has pain all over.    The patient denies cough, sputum production,  orthopnea, PND, ankle edema.    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  Review of Systems A comprehensive review of systems was negative.   Past History:   Active Ambulatory Problems    Diagnosis Date Noted  . Throat pain in adult 03/19/2017  . Anxiety 03/19/2017   Resolved Ambulatory Problems    Diagnosis Date Noted  . No Resolved Ambulatory Problems   Past Medical History:  Diagnosis Date  . Bipolar 1 disorder (CMS/HCC)   . Chronic pain   . Pancreatitis    Family History  Problem Relation Name Age of Onset  . Cancer Maternal Grandmother     Social History Social History   Tobacco Use  . Smoking status: Former  . Smokeless tobacco: Never  Substance Use Topics  . Alcohol use: Yes  . Drug use: No  Social History   Social History Narrative  . Not on file     Medications:   No orders of the defined types were placed in this encounter.    Medication and other allergies: Opioids - morphine  analogues     Diagnostic Review:  I personally reviewed the following: Post-Bronchodilator  Spirometry Pre-Bronchodilator Spirometry Total Lung Capacity and Diffusing Capacity   No results found for: FVCPREACTUAL, FEV1PREACTUA, FEV1FVCPRE, FEV1FVCLLN, RVN2PCPRE, RVPLETHPRE, TLCN2PREACT, TLCPLETHPREA, DLCOUNCPRE   Assessment:   1. Dyspnea, unspecified type   2. Cough, unspecified type       Plan:   1. Dyspnea, unspecified type (Primary) - PFTs  2.  Cough -CT chest   6 weeks  Thank you for the opportunity to provide consultation for your patient. If I can be of further assistance please do not hesitate to contact my office.  Office Phone: [phone]  Office Fax: [phone]

## 2023-05-14 NOTE — Progress Notes (Signed)
   05/14/23 0205  [facility] Triage Screening (Walk-ins at [name] only)  How Did You Hear About Us ? Self  What Is the Reason for Your Visit/Call Today? Pt presents to [facility] as a voluntary walk-in, unaccompanied with complaints of chronic pain and passive SI. Pt denies suicidal plan or intent. Pt was seen at [facility] earlier and sent to ED for medical clearance. Pt has now returned and states that he is having difficulty managing his pain. Pt denies taking medications at this time. Pt currently denies HI,AVH and substance/alcohol use.  How Long Has This Been Causing You Problems? > than 6 months  Have You Recently Had Any Thoughts About Hurting Yourself? Yes  How long ago did you have thoughts about hurting yourself? earlier today  Are You Planning to Commit Suicide/Harm Yourself At This time? No  Have you Recently Had Thoughts About Hurting Someone [name]? No  Are You Planning To Harm Someone At This Time? No  Physical Abuse Denies  Verbal Abuse Denies  Sexual Abuse Denies  Exploitation of patient/patient's resources Denies  Self-Neglect Denies  Possible abuse reported to:  (NA)  Are you currently experiencing any auditory, visual or other hallucinations? No  Have You Used Any Alcohol or Drugs in the Past 24 Hours? No  Do you have any current medical co-morbidities that require immediate attention? No  Clinician description of patient physical appearance/behavior: calm, cooperative, oriented  What Do You Feel Would Help You the Most Today? Medication(s)  If access to [facility] Urgent [facility] was not available, would you have sought care in the Emergency Department? No  Determination of Need Routine (7 days)  Options For Referral Other: Comment;Medication Management

## 2023-05-14 NOTE — ED Provider Notes (Signed)
 [facility] Continuous Assessment Admission H&P  Date: 05/14/23 Patient Name: [name] MRN: [id number] Chief Complaint:   Diagnoses:  Final diagnoses:  None    HPI: [name], is seen face to face by this provider, consulted with Dr. Zouev; and chart reviewed on 05/14/23.    History of Present Illness: [name] is a 68 year old male who presented to [facility] as a voluntary walk-in, unaccompanied. He was seen earlier this morning and referred to the emergency department for medical clearance. He has now returned for further evaluation.  The patient reports chronic pain and passive suicidal ideation, stating, If I wanted to kill myself, I would know how to do it. I have lots of pills at home. He reports experiences suicidal thoughts daily due to his chronic pain, adding, Wouldn't you have suicidal thoughts if you were in my position? He also reports a past suicide attempt in 1987, when he took heroin he purchased off the street but states it was not enough to be fatal. Additionally, he has a family history of suicide, as his grandfather died by self-inflicted gunshot wound.  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 He reports long-term benzodiazepine use in the past, with pain symptoms gradually developing after discontinuation of the medication. He states that his pain has progressively worsened and that he is unable to tolerate any medications, as they exacerbate his pain. He has previously tried amitriptyline , mirtazapine  (Remeron ), and duloxetine , noting that duloxetine  provided some relief.  The patient denies any current substance use aside from marijuana and does not endorse taking any prescribed medications. A urine drug screen (UDS) conducted in the emergency department earlier today was positive only for marijuana. He continues to endorse passive suicidal ideation, stating that the chronic pain sometimes  make him want to shoot himself but wouldn't do it because he is christian and he doesn't want to go to hell.   Patient denies having access to guns or having guns at home.   He denies homicidal ideation, auditory or visual hallucinations (AVH), or paranoia. He reports depressive symptoms, including insomnia, hopelessness, anhedonia, and low energy, as well as anxiety symptoms.  During the evaluation, [name] appeared in no acute distress. He was alert and oriented 4 but became restless, getting up and pacing throughout the assessment. He was cooperative and attentive but exhibited an anxious mood with a congruent affect. His speech was rapid and pressured, particularly when discussing his pain and frustration with the medical system. Objectively, there was no evidence of psychosis, mania, or delusional thinking. He did not appear to be responding to internal or external stimuli and was able to converse coherently with no signs of distractibility. However, he demonstrated a significant preoccupation with his pain. He denies current suicidal or homicidal ideation, self-harm intent, psychosis, or paranoia.  Based on the patient's presentation and identified risk factors--including chronic pain, daily passive suicidal ideation, a past suicide attempt, a family history of suicide, and worsening depressive symptoms--the patient is unable to contract for safety. Inpatient admission is recommended for further evaluation and stabilization.  However, the patient is unwilling to stay, stating that he wants to attempt treatment with a pain management doctor once more before considering hospitalization. Given the ongoing risk factors and his inability to ensure safety, involuntary commitment (IVC) will be initiated.  Total Time spent with patient: 1 hour  Musculoskeletal  Strength & Muscle Tone: within normal limits Gait & Station: normal  Patient leans: N/A  Psychiatric Specialty Exam   Presentation General Appearance:  Appropriate for Environment  Eye Contact: Minimal  Speech: Pressured  Speech Volume: Increased  Handedness: Right   Mood and Affect  Mood: Anxious; Depressed; Dysphoric; Hopeless  Affect: Tearful; Congruent   Thought Process  Thought Processes: Coherent  Descriptions of Associations:Circumstantial  Orientation:Full (Time, Place and Person)  Thought Content:WDL  Diagnosis of Schizophrenia or Schizoaffective disorder in past: No data recorded  Hallucinations:Hallucinations: None  Ideas of Reference:None  Suicidal Thoughts:Suicidal Thoughts: Yes, Passive SI Passive Intent and/or Plan: Without Intent; Without Plan  Homicidal Thoughts:Homicidal Thoughts: No   Sensorium  Memory: Immediate Fair; Recent Fair; Remote Fair  Judgment: Fair  Insight: Fair   Chartered Certified Accountant: Fair  Attention Span: Fair  Recall: [employer] of Knowledge: Fair  Language: Fair   Psychomotor Activity  Psychomotor Activity: Psychomotor Activity: Restlessness   Assets  Assets: Manufacturing Systems Engineer; Desire for Improvement; Housing   Sleep  Sleep: Sleep: Poor Number of Hours of Sleep: 3   Nutritional Assessment (For OBS and FBC admissions only) Has the patient had a weight loss or gain of 10 pounds or more in the last 3 months?: No Has the patient had a decrease in food intake/or appetite?: No Does the patient have dental problems?: No Does the patient have eating habits or behaviors that may be indicators of an eating disorder including binging or inducing vomiting?: No Has the patient recently lost weight without trying?: 0 Has the patient been eating poorly because of a decreased appetite?: 0 Malnutrition Screening Tool Score: 0    Physical Exam Constitutional:      General: He is not in acute distress. Neurological:     Mental Status: He is alert.  Psychiatric:        Attention and Perception:  He does not perceive auditory or visual hallucinations.        Mood and Affect: Mood is anxious and depressed.        Speech: Speech is rapid and pressured.        Behavior: Behavior is cooperative.        Thought Content: Thought content includes suicidal ideation. Thought content does not include homicidal ideation.    Review of Systems  Constitutional:  Negative for chills and fever.  HENT:  Positive for tinnitus.   Eyes:  Positive for blurred vision.  Respiratory:  Positive for cough.   Cardiovascular:  Negative for chest pain and palpitations.  Gastrointestinal:  Negative for nausea and vomiting.  Neurological:  Positive for headaches.  Psychiatric/Behavioral:  Positive for depression, hallucinations and suicidal ideas. The patient is nervous/anxious and has insomnia.     There were no vitals taken for this visit. There is no height or weight on file to calculate BMI.  Past Psychiatric History: Past suicidal attempt (1987)  Is the patient at risk to self? Yes  Has the patient been a risk to self in the past 6 months? Yes .    Has the patient been a risk to self within the distant past? Yes   Is the patient a risk to others? Yes   Has the patient been a risk to others in the past 6 months? No Has the patient been a risk to others within the distant past? No    Family History: Grandfather completed suicide by gunshot wound   Last Labs:  Admission on 05/13/2023, Discharged on 05/14/2023  Component Date Value Ref Range Status  WBC 05/13/2023 9.9  4.0 - 10.5 K/uL Final   RBC 05/13/2023 5.65  4.22 - 5.81 MIL/uL Final   Hemoglobin 05/13/2023 15.3  13.0 - 17.0 g/dL Final   HCT 97/95/7974 45.4  39.0 - 52.0 % Final   MCV 05/13/2023 80.4  80.0 - 100.0 fL Final   MCH 05/13/2023 27.1  26.0 - 34.0 pg Final   MCHC 05/13/2023 33.7  30.0 - 36.0 g/dL Final   RDW 97/95/7974 13.6  11.5 - 15.5 % Final   Platelets 05/13/2023 282  150 - 400 K/uL Final   nRBC 05/13/2023 0.0  0.0 - 0.2 %  Final   Neutrophils Relative % 05/13/2023 64  % Final   Neutro Abs 05/13/2023 6.5  1.7 - 7.7 K/uL Final   Lymphocytes Relative 05/13/2023 24  % Final   Lymphs Abs 05/13/2023 2.3  0.7 - 4.0 K/uL Final   Monocytes Relative 05/13/2023 10  % Final   Monocytes Absolute 05/13/2023 1.0  0.1 - 1.0 K/uL Final   Eosinophils Relative 05/13/2023 1  % Final   Eosinophils Absolute 05/13/2023 0.1  0.0 - 0.5 K/uL Final   Basophils Relative 05/13/2023 1  % Final   Basophils Absolute 05/13/2023 0.1  0.0 - 0.1 K/uL Final   Immature Granulocytes 05/13/2023 0  % Final   Abs Immature Granulocytes 05/13/2023 0.02  0.00 - 0.07 K/uL Final   Performed at [facility], [address], [place], [place] [address]   Troponin I (High Sensitivity) 05/13/2023 5  <18 ng/L Final   Comment: (NOTE) Elevated high sensitivity troponin I (hsTnI) values and significant  changes across serial measurements may suggest ACS but many other  chronic and acute conditions are known to elevate hsTnI results.  Refer to the Links section for chest pain algorithms and additional  guidance. Performed at [facility], [address], [place], [place] [address]    Sodium 05/13/2023 137  135 - 145 mmol/L Final   Potassium 05/13/2023 4.2  3.5 - 5.1 mmol/L Final   HEMOLYSIS AT THIS LEVEL MAY AFFECT RESULT   Chloride 05/13/2023 106  98 - 111 mmol/L Final   CO2 05/13/2023 20 (L)  22 - 32 mmol/L Final   Glucose, Bld 05/13/2023 109 (H)  70 - 99 mg/dL Final   Glucose reference range applies only to samples taken after fasting for at least 8 hours.   BUN 05/13/2023 21  8 - 23 mg/dL Final   Creatinine, Ser 05/13/2023 1.02  0.61 - 1.24 mg/dL Final   Calcium 97/95/7974 9.6  8.9 - 10.3 mg/dL Final   Total Protein 97/95/7974 7.1  6.5 - 8.1 g/dL Final   Albumin 97/95/7974 4.4  3.5 - 5.0 g/dL Final   AST 97/95/7974 36  15 - 41 U/L Final   HEMOLYSIS AT THIS LEVEL MAY AFFECT RESULT   ALT 05/13/2023 30  0 - 44 U/L Final   HEMOLYSIS AT  THIS LEVEL MAY AFFECT RESULT   Alkaline Phosphatase 05/13/2023 70  38 - 126 U/L Final   Total Bilirubin 05/13/2023 1.0  0.0 - 1.2 mg/dL Final   HEMOLYSIS AT THIS LEVEL MAY AFFECT RESULT   GFR, Estimated 05/13/2023 >60  >60 mL/min Final   Comment: (NOTE) Calculated using the CKD-EPI Creatinine Equation (2021)    Anion gap 05/13/2023 11  5 - 15 Final   Performed at [facility], [address], [place], [place] [address]   D-Dimer, Quant 05/13/2023 <0.27  0.00 - 0.50  ug/mL-FEU Final   Comment: (NOTE) At the manufacturer cut-off value of 0.5 g/mL FEU, this assay has a negative predictive value of 95-100%.This assay is intended for use in conjunction with a clinical pretest probability (PTP) assessment model to exclude pulmonary embolism (PE) and deep venous thrombosis (DVT) in outpatients suspected of PE or DVT. Results should be correlated with clinical presentation. Performed at [facility], [address], [place], [place] [address]    SARS Coronavirus 2 by RT PCR 05/13/2023 NEGATIVE  NEGATIVE Final   Influenza A by PCR 05/13/2023 NEGATIVE  NEGATIVE Final   Influenza B by PCR 05/13/2023 NEGATIVE  NEGATIVE Final   Comment: (NOTE) The Xpert Xpress SARS-CoV-2/FLU/RSV plus assay is intended as an aid in the diagnosis of influenza from Nasopharyngeal swab specimens and should not be used as a sole basis for treatment. Nasal washings and aspirates are unacceptable for Xpert Xpress SARS-CoV-2/FLU/RSV testing.  Fact Sheet for Patients: bloggercourse.com  Fact Sheet for Healthcare Providers: seriousbroker.it  This test is not yet approved or cleared by the [place]  FDA and has been authorized for detection and/or diagnosis of SARS-CoV-2 by FDA under an Emergency Use Authorization (EUA). This EUA will remain in effect (meaning this test can be used) for the duration of the COVID-19 declaration under Section 564(b)(1)  of the Act, 21 U.S.C. section 360bbb-3(b)(1), unless the authorization is terminated or revoked.     Resp Syncytial Virus by PCR 05/13/2023 NEGATIVE  NEGATIVE Final   Comment: (NOTE) Fact Sheet for Patients: bloggercourse.com  Fact Sheet for Healthcare Providers: seriousbroker.it  This test is not yet approved or cleared by the [place]  FDA and has been authorized for detection and/or diagnosis of SARS-CoV-2 by FDA under an Emergency Use Authorization (EUA). This EUA will remain in effect (meaning this test can be used) for the duration of the COVID-19 declaration under Section 564(b)(1) of the Act, 21 U.S.C. section 360bbb-3(b)(1), unless the authorization is terminated or revoked.  Performed at [facility], [address], [place], [place] [address]    Alcohol, Ethyl (B) 05/13/2023 <10  <10 mg/dL Final   Comment: (NOTE) Lowest detectable limit for serum alcohol is 10 mg/dL.  For medical purposes only. Performed at [facility], [address], [place], [place] [address]    Opiates 05/13/2023 NONE DETECTED  NONE DETECTED Final   Cocaine 05/13/2023 NONE DETECTED  NONE DETECTED Final   Benzodiazepines 05/13/2023 NONE DETECTED  NONE DETECTED Final   Amphetamines 05/13/2023 NONE DETECTED  NONE DETECTED Final   Tetrahydrocannabinol 05/13/2023 POSITIVE (A)  NONE DETECTED Final   Barbiturates 05/13/2023 NONE DETECTED  NONE DETECTED Final   Comment: (NOTE) DRUG SCREEN FOR MEDICAL PURPOSES ONLY.  IF CONFIRMATION IS NEEDED FOR ANY PURPOSE, NOTIFY LAB WITHIN 5 DAYS.  LOWEST DETECTABLE LIMITS FOR URINE DRUG SCREEN Drug Class                     Cutoff (ng/mL) Amphetamine and metabolites    1000 Barbiturate and metabolites    200 Benzodiazepine                 200 Opiates and metabolites        300 Cocaine and metabolites        300 THC                            50 Performed at [facility],  [address] [place], [place] [address]    Troponin I (High Sensitivity) 05/13/2023 5  <18 ng/L Final   Comment: (NOTE) Elevated high sensitivity troponin I (hsTnI) values and significant  changes across serial measurements may suggest ACS but many other  chronic and acute conditions are known to elevate hsTnI results.  Refer to the Links section for chest pain algorithms and additional  guidance. Performed at [facility], [address] [place], [place] [address]   Admission on 05/03/2023, Discharged on 05/03/2023  Component Date Value Ref Range Status   Sodium 05/03/2023 141  135 - 145 mmol/L Final   Potassium 05/03/2023 3.8  3.5 - 5.1 mmol/L Final   Chloride 05/03/2023 109  98 - 111 mmol/L Final   CO2 05/03/2023 22  22 - 32 mmol/L Final   Glucose, Bld 05/03/2023 113 (H)  70 - 99 mg/dL Final   Glucose reference range applies only to samples taken after fasting for at least 8 hours.   BUN 05/03/2023 23  8 - 23 mg/dL Final   Creatinine, Ser 05/03/2023 0.93  0.61 - 1.24 mg/dL Final   Calcium 98/74/7974 9.6  8.9 - 10.3 mg/dL Final   GFR, Estimated 05/03/2023 >60  >60 mL/min Final   Comment: (NOTE) Calculated using the CKD-EPI Creatinine Equation (2021)    Anion gap 05/03/2023 10  5 - 15 Final   Performed at [facility], [address], [place], [place] [address]   WBC 05/03/2023 6.6  4.0 - 10.5 K/uL Final   RBC 05/03/2023 5.82 (H)  4.22 - 5.81 MIL/uL Final   Hemoglobin 05/03/2023 15.7  13.0 - 17.0 g/dL Final   HCT 98/74/7974 47.6  39.0 - 52.0 % Final   MCV 05/03/2023 81.8  80.0 - 100.0 fL Final   MCH 05/03/2023 27.0  26.0 - 34.0 pg Final   MCHC 05/03/2023 33.0  30.0 - 36.0 g/dL Final   RDW 98/74/7974 13.8  11.5 - 15.5 % Final   Platelets 05/03/2023 267  150 - 400 K/uL Final   nRBC 05/03/2023 0.0  0.0 - 0.2 % Final   Performed at [facility], [address] [place], [place] [address]   Troponin I (High Sensitivity) 05/03/2023 2  <18  ng/L Final   Comment: (NOTE) Elevated high sensitivity troponin I (hsTnI) values and significant  changes across serial measurements may suggest ACS but many other  chronic and acute conditions are known to elevate hsTnI results.  Refer to the Links section for chest pain algorithms and additional  guidance. Performed at [facility], [address], [place], [place] [address]    Troponin I (High Sensitivity) 05/03/2023 <2  <18 ng/L Final   Comment: (NOTE) Elevated high sensitivity troponin I (hsTnI) values and significant  changes across serial measurements may suggest ACS but many other  chronic and acute conditions are known to elevate hsTnI results.  Refer to the Links section for chest pain algorithms and additional  guidance. Performed at [facility] [address], [place], [place] [address]   Admission on 04/22/2023, Discharged on 04/23/2023  Component Date Value Ref Range Status   Opiates 04/22/2023 NONE DETECTED  NONE DETECTED Final   Cocaine 04/22/2023 NONE DETECTED  NONE DETECTED Final   Benzodiazepines 04/22/2023 NONE DETECTED  NONE DETECTED Final   Amphetamines 04/22/2023 NONE DETECTED  NONE DETECTED Final   Tetrahydrocannabinol 04/22/2023 POSITIVE (A)  NONE DETECTED Final   Barbiturates 04/22/2023 NONE  DETECTED  NONE DETECTED Final   Comment: (NOTE) DRUG SCREEN FOR MEDICAL PURPOSES ONLY.  IF CONFIRMATION IS NEEDED FOR ANY PURPOSE, NOTIFY LAB WITHIN 5 DAYS.  LOWEST DETECTABLE LIMITS FOR URINE DRUG SCREEN Drug Class                     Cutoff (ng/mL) Amphetamine and metabolites    1000 Barbiturate and metabolites    200 Benzodiazepine                 200 Opiates and metabolites        300 Cocaine and metabolites        300 THC                            50 Performed at [facility], [address], [place], [place] [address]    Alcohol, Ethyl (B) 04/22/2023 <10  <10 mg/dL Final   Comment:  (NOTE) Lowest detectable limit for serum alcohol is 10 mg/dL.  For medical purposes only. Performed at [facility], [address] [place], [place] [address]    Magnesium  04/22/2023 2.3  1.7 - 2.4 mg/dL Final   Performed at [facility], [address] [place], [place] [address]   Total CK 04/22/2023 112  49 - 397 U/L Final   Performed at [facility], [address], [place], [place] [address]   WBC 04/22/2023 9.2  4.0 - 10.5 K/uL Final   RBC 04/22/2023 5.74  4.22 - 5.81 MIL/uL Final   Hemoglobin 04/22/2023 15.4  13.0 - 17.0 g/dL Final   HCT 98/85/7974 47.5  39.0 - 52.0 % Final   MCV 04/22/2023 82.8  80.0 - 100.0 fL Final   MCH 04/22/2023 26.8  26.0 - 34.0 pg Final   MCHC 04/22/2023 32.4  30.0 - 36.0 g/dL Final   RDW 98/85/7974 13.7  11.5 - 15.5 % Final   Platelets 04/22/2023 248  150 - 400 K/uL Final   nRBC 04/22/2023 0.0  0.0 - 0.2 % Final   Neutrophils Relative % 04/22/2023 65  % Final   Neutro Abs 04/22/2023 5.8  1.7 - 7.7 K/uL Final   Lymphocytes Relative 04/22/2023 25  % Final   Lymphs Abs 04/22/2023 2.3  0.7 - 4.0 K/uL Final   Monocytes Relative 04/22/2023 9  % Final   Monocytes Absolute 04/22/2023 0.8  0.1 - 1.0 K/uL Final   Eosinophils Relative 04/22/2023 1  % Final   Eosinophils Absolute 04/22/2023 0.1  0.0 - 0.5 K/uL Final   Basophils Relative 04/22/2023 0  % Final   Basophils Absolute 04/22/2023 0.0  0.0 - 0.1 K/uL Final   Immature Granulocytes 04/22/2023 0  % Final   Abs Immature Granulocytes 04/22/2023 0.03  0.00 - 0.07 K/uL Final   Performed at [facility], [address] [place], [place] [address]   Sodium 04/22/2023 139  135 - 145 mmol/L Final   Potassium 04/22/2023 3.9  3.5 - 5.1 mmol/L Final   Chloride 04/22/2023 108  98 - 111 mmol/L Final   CO2 04/22/2023 21 (L)  22 - 32 mmol/L Final   Glucose, Bld 04/22/2023 101 (H)  70 - 99 mg/dL Final   Glucose reference range applies only  to samples taken after fasting for at least 8 hours.   BUN 04/22/2023 23  8 - 23 mg/dL Final   Creatinine, Ser 04/22/2023 0.90  0.61 - 1.24 mg/dL Final   Calcium 98/85/7974 9.5  8.9 - 10.3 mg/dL Final   Total Protein 98/85/7974 7.8  6.5 - 8.1 g/dL Final   Albumin 98/85/7974 4.4  3.5 - 5.0 g/dL Final   AST 98/85/7974 26  15 - 41 U/L Final   ALT 04/22/2023 37  0 - 44 U/L Final   Alkaline Phosphatase 04/22/2023 64  38 - 126 U/L Final   Total Bilirubin 04/22/2023 0.7  0.0 - 1.2 mg/dL Final   GFR, Estimated 04/22/2023 >60  >60 mL/min Final   Comment: (NOTE) Calculated using the CKD-EPI Creatinine Equation (2021)    Anion gap 04/22/2023 10  5 - 15 Final   Performed at [facility], [address], [place], [place] [address]   Color, Urine 04/22/2023 YELLOW  YELLOW Final   APPearance 04/22/2023 CLEAR  CLEAR Final   Specific Gravity, Urine 04/22/2023 1.021  1.005 - 1.030 Final   pH 04/22/2023 5.0  5.0 - 8.0 Final   Glucose, UA 04/22/2023 NEGATIVE  NEGATIVE mg/dL Final   Hgb urine dipstick 04/22/2023 NEGATIVE  NEGATIVE Final   Bilirubin Urine 04/22/2023 NEGATIVE  NEGATIVE Final   Ketones, ur 04/22/2023 NEGATIVE  NEGATIVE mg/dL Final   Protein, ur 98/85/7974 NEGATIVE  NEGATIVE mg/dL Final   Nitrite 98/85/7974 NEGATIVE  NEGATIVE Final   Leukocytes,Ua 04/22/2023 NEGATIVE  NEGATIVE Final   Performed at [facility], [address] [place], [place] [address]    Allergies: Diazepam, Morphine  and codeine, and Gabapentin   Medications:  Facility Ordered Medications  Medication   acetaminophen  (TYLENOL ) tablet 650 mg   alum & mag hydroxide-simeth (MAALOX/MYLANTA) 200-200-20 MG/5ML suspension 30 mL   magnesium  hydroxide (MILK OF MAGNESIA) suspension 30 mL   DULoxetine  (CYMBALTA ) DR capsule 30 mg   QUEtiapine  (SEROQUEL ) tablet 25 mg      Medical Decision Making  - Based on the patient's presentation and identified risk factors--including chronic pain,  daily passive suicidal ideation, a past suicide attempt, a family history of suicide, and worsening depressive symptoms--the patient is unable to contract for safety. Inpatient admission is recommended for further evaluation and stabilization.  -initiate IVC -initiate Duloxetine  30mg  po daily -Initiate Quetiapine   25mg po BID prn -Recommend inpatient psychiatric admission. Will refer if no beds available.  WIll defer labs, they were obtained last night. Have been determined to be WNL limits.   Recommendations  Based on my evaluation the patient does not appear to have an emergency medical condition.  Majel GORMAN Ramp, FNP 05/14/23  5:34 PM
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
## 2023-05-14 NOTE — Discharge Instructions (Addendum)
 Patient accepted to [facility] under involuntary commitment.

## 2023-05-14 NOTE — Discharge Instructions (Signed)
 You are encouraged to follow up with [facility] for outpatient treatment.  Walk in/ Open Access Hours: Monday - Friday 8AM - 11AM (To see provider and therapist) Friday - 1PM - 4PM (To see therapist only)  [facility] [address] [place], [place] [phone] are encouraged to follow up with [facility] for outpatient treatment.  Walk in/ Open Access Hours: Monday - Friday 8AM - 11AM (To see provider and therapist) Friday - 1PM - 4PM (To see therapist only)  [facility] [address] [place], [place] [phone]

## 2023-05-15 ENCOUNTER — Inpatient Hospital Stay
Admission: RE | Admit: 2023-05-15 | Discharge: 2023-05-21 | DRG: 885 | Disposition: A | Discharge status: Home / Self Care | Payer: 59 | Source: Intra-hospital | Attending: Psychiatry | Admitting: Psychiatry

## 2023-05-15 ENCOUNTER — Other Ambulatory Visit: Payer: Self-pay

## 2023-05-15 ENCOUNTER — Encounter: Payer: Self-pay | Admitting: Family

## 2023-05-15 DIAGNOSIS — E785 Hyperlipidemia, unspecified: Secondary | ICD-10-CM | POA: Diagnosis present

## 2023-05-15 DIAGNOSIS — R45851 Suicidal ideations: Secondary | ICD-10-CM | POA: Diagnosis present

## 2023-05-15 DIAGNOSIS — Z841 Family history of disorders of kidney and ureter: Secondary | ICD-10-CM | POA: Diagnosis not present

## 2023-05-15 DIAGNOSIS — R079 Chest pain, unspecified: Secondary | ICD-10-CM | POA: Diagnosis not present

## 2023-05-15 DIAGNOSIS — Z803 Family history of malignant neoplasm of breast: Secondary | ICD-10-CM | POA: Diagnosis not present

## 2023-05-15 DIAGNOSIS — F332 Major depressive disorder, recurrent severe without psychotic features: Principal | ICD-10-CM | POA: Diagnosis present

## 2023-05-15 DIAGNOSIS — Z8269 Family history of other diseases of the musculoskeletal system and connective tissue: Secondary | ICD-10-CM | POA: Diagnosis not present

## 2023-05-15 DIAGNOSIS — F4310 Post-traumatic stress disorder, unspecified: Secondary | ICD-10-CM | POA: Diagnosis present

## 2023-05-15 DIAGNOSIS — Z87891 Personal history of nicotine dependence: Secondary | ICD-10-CM

## 2023-05-15 DIAGNOSIS — Z833 Family history of diabetes mellitus: Secondary | ICD-10-CM | POA: Diagnosis not present

## 2023-05-15 DIAGNOSIS — Z8619 Personal history of other infectious and parasitic diseases: Secondary | ICD-10-CM

## 2023-05-15 DIAGNOSIS — G8929 Other chronic pain: Secondary | ICD-10-CM | POA: Diagnosis present

## 2023-05-15 DIAGNOSIS — Z79899 Other long term (current) drug therapy: Secondary | ICD-10-CM | POA: Diagnosis not present

## 2023-05-15 DIAGNOSIS — F333 Major depressive disorder, recurrent, severe with psychotic symptoms: Secondary | ICD-10-CM | POA: Diagnosis not present

## 2023-05-15 DIAGNOSIS — I10 Essential (primary) hypertension: Secondary | ICD-10-CM | POA: Diagnosis present

## 2023-05-15 DIAGNOSIS — Z9151 Personal history of suicidal behavior: Secondary | ICD-10-CM

## 2023-05-15 DIAGNOSIS — Z825 Family history of asthma and other chronic lower respiratory diseases: Secondary | ICD-10-CM | POA: Diagnosis not present

## 2023-05-15 DIAGNOSIS — M545 Low back pain, unspecified: Secondary | ICD-10-CM | POA: Diagnosis present

## 2023-05-15 DIAGNOSIS — Z8249 Family history of ischemic heart disease and other diseases of the circulatory system: Secondary | ICD-10-CM | POA: Diagnosis not present

## 2023-05-15 DIAGNOSIS — G4730 Sleep apnea, unspecified: Secondary | ICD-10-CM | POA: Diagnosis present

## 2023-05-15 MED ORDER — OLANZAPINE 5 MG PO TBDP
5.0000 mg | ORAL_TABLET | Freq: Three times a day (TID) | ORAL | Status: DC | PRN
  Administered 2023-05-17 – 2023-05-18 (×2): 5 mg via ORAL
  Filled 2023-05-15 (×2): qty 1

## 2023-05-15 MED ORDER — OLANZAPINE 10 MG IM SOLR: 5.0000 mg | Freq: Three times a day (TID) | INTRAMUSCULAR | Status: DC | PRN

## 2023-05-15 MED ORDER — PROPRANOLOL HCL 20 MG PO TABS
20.0000 mg | ORAL_TABLET | Freq: Two times a day (BID) | ORAL | Status: DC
  Administered 2023-05-15 – 2023-05-21 (×12): 20 mg via ORAL
  Filled 2023-05-15 (×12): qty 1

## 2023-05-15 MED ORDER — CLONIDINE HCL 0.1 MG PO TABS
0.1000 mg | ORAL_TABLET | ORAL | Status: AC
  Administered 2023-05-15: 0.1 mg via ORAL
  Filled 2023-05-15: qty 1

## 2023-05-15 MED ORDER — MAGNESIUM HYDROXIDE 400 MG/5ML PO SUSP: 30.0000 mL | Freq: Every day | ORAL | Status: DC | PRN

## 2023-05-15 MED ORDER — ALUM & MAG HYDROXIDE-SIMETH 200-200-20 MG/5ML PO SUSP: 30.0000 mL | ORAL | Status: DC | PRN

## 2023-05-15 MED ORDER — TRAZODONE HCL 100 MG PO TABS
100.0000 mg | ORAL_TABLET | Freq: Every evening | ORAL | Status: DC | PRN
  Filled 2023-05-15 (×2): qty 1

## 2023-05-15 MED ORDER — ACETAMINOPHEN 325 MG PO TABS
650.0000 mg | ORAL_TABLET | Freq: Four times a day (QID) | ORAL | Status: DC | PRN
  Administered 2023-05-15 – 2023-05-21 (×15): 650 mg via ORAL
  Filled 2023-05-15 (×16): qty 2

## 2023-05-15 NOTE — Group Note (Signed)
 Recreation Therapy Group Note   Group Topic:Relaxation  Group Date: 05/15/2023 Start Time: 1100 End Time: 1135 Facilitators: Celestia Jeoffrey BRAVO, LRT, CTRS Location:  Dayroom  Group Description: Chair Yoga. LRT and patients discussed the benefits of yoga and how it differs from strength exercises. LRT educated patients on the mental and physical benefits of yoga and deep breathing and how it can be used as a associate professor. LRT and patients followed along to a guided yoga session on the television that focused on all parts of the body, as well as deep breathing. Pt encouraged to stop movement at any time if they feel discomfort or pain.   Goal Area(s) Addressed: Patient will practice using relaxation technique. Patient will identify a new coping skill.  Patient will follow multistep directions to reduce anxiety and stress.   Affect/Mood: N/A   Participation Level: Did not attend    Clinical Observations/Individualized Feedback: Patient did not attend group due to not being on the unit yet.   Plan: Continue to engage patient in RT group sessions 2-3x/week.   Jeoffrey BRAVO Celestia, LRT, CTRS 05/15/2023 1:44 PM

## 2023-05-15 NOTE — ED Notes (Signed)
 Nurse Alroy Jericho from [facility] returned call for report on patient and offered no concerns. Patient Is IVC and will be transferred by [facility]. Will continue to monitor behavior until discharged. Safety maintained.

## 2023-05-15 NOTE — ED Notes (Signed)
 Called [facility] to give report, spoke with Nurse [facility] who stated she would call back for report on patient as she was in meeting. Charge Nurse informed.

## 2023-05-15 NOTE — Group Note (Signed)
 Recreation Therapy Group Note   Group Topic:Emotion Expression  Group Date: 05/15/2023 Start Time: 1500 End Time: 1600 Facilitators: Celestia Jeoffrey BRAVO, LRT, CTRS Location:  Dayroom  Group Description: Positivity Collage. LRT and patients discussed the importance of having a positive mindset and being happy. Patients received magazines, safety scissors, a glue stick and a piece of paper. Pts were encouraged to find images or words in the magazines that showed "happiness" or positivity to them. Pt shared their collage with the group once they were finished. LRT and pts discussed how it can be difficult to always have a positive mindset, especially when they have mental health challenges.   Goal Area(s) Addressed:  Pt will identify things associate with positivity. Pt will reduce negative thinking. Pt will identify a new coping skill of thinking positive thoughts.    Affect/Mood: Appropriate   Participation Level: Active and Engaged   Participation Quality: Independent   Behavior: Appropriate, Calm, and Cooperative   Speech/Thought Process: Coherent   Insight: Good   Judgement: Good   Modes of Intervention: Art, Education, Exploration, and Guided Discussion   Patient Response to Interventions:  Attentive, Engaged, and Receptive   Education Outcome:  Acknowledges education   Clinical Observations/Individualized Feedback: [name] was active in their participation of session activities and group discussion. Pt identified love, family, and the ocean as positive things. Pt interacted well with LRT and peers duration of session.    Plan: Continue to engage patient in RT group sessions 2-3x/week.   Jeoffrey BRAVO Celestia, LRT, CTRS 05/15/2023 4:44 PM

## 2023-05-15 NOTE — ED Notes (Signed)
 Pt is currently sleeping, no distress noted, environmental check complete, will continue to monitor patient for safety.

## 2023-05-15 NOTE — Tx Team (Signed)
 Initial Treatment Plan 05/15/2023 2:01 PM [name] FMW:[id number]    PATIENT STRESSORS: Health problems   Other: chronic pain      PATIENT STRENGTHS: Ability for insight  Religious Affiliation    PATIENT IDENTIFIED PROBLEMS: Chronic pain-untreated pain   Unemployed                    DISCHARGE CRITERIA:  Ability to meet basic life and health needs Reduction of life-threatening or endangering symptoms to within safe limits  PRELIMINARY DISCHARGE PLAN: Return to previous living arrangement  PATIENT/FAMILY INVOLVEMENT: This treatment plan has been presented to and reviewed with the patient, [name],  The patient and family have been given the opportunity to ask questions and make suggestions.  Coralyn Roselli B Herma Uballe, RN 05/15/2023, 2:01 PM

## 2023-05-15 NOTE — ED Notes (Signed)
 Patient observed/assessed in bed/chair resting quietly appearing in no distress and verbalizing no complaints at this time. Will continue to monitor.

## 2023-05-15 NOTE — Progress Notes (Signed)
 Pt come from [facility] with SI, no plan, c/o generalized pain 10/10/. Pt oriented to the unit. Pt contracts for safety while on unit.   05/15/23 1300  Psych Admission Type (Psych Patients Only)  Admission Status Involuntary  Psychosocial Assessment  Patient Complaints Agitation;Anxiety;Decreased concentration;Depression;Hopelessness;Irritability;Isolation;Loneliness;Restlessness;Sadness;Self-harm thoughts  Eye Contact Fair  Facial Expression Worried;Wide-eyed;Anxious  Affect Anxious;Irritable  Speech Logical/coherent  Interaction Assertive  Motor Activity Other (Comment) (WNL)  Appearance/Hygiene Unremarkable  Behavior Characteristics Cooperative;Restless  Mood Anxious;Helpless  Aggressive Behavior  Effect No apparent injury  Thought Chartered Certified Accountant of ideas;Tangential;Disorganized  Delusions None reported or observed  Hallucination None reported or observed  Confusion WDL  Danger to Self  Current suicidal ideation? Passive  Agreement Not to Harm Self Yes  Description of Agreement verbal  Danger to Others  Danger to Others None reported or observed

## 2023-05-15 NOTE — Plan of Care (Signed)
  Problem: Education: Goal: Ability to make informed decisions regarding treatment will improve Outcome: Not Progressing   Problem: Coping: Goal: Coping ability will improve Outcome: Not Progressing   Problem: Health Behavior/Discharge Planning: Goal: Identification of resources available to assist in meeting health care needs will improve Outcome: Not Progressing   Problem: Medication: Goal: Compliance with prescribed medication regimen will improve Outcome: Not Progressing   Problem: Self-Concept: Goal: Ability to disclose and discuss suicidal ideas will improve Outcome: Not Progressing

## 2023-05-15 NOTE — Group Note (Signed)
 Date:  05/15/2023 Time:  8:53 PM  Group Topic/Focus:  Identifying Needs:   The focus of this group is to help patients identify their personal needs that have been historically problematic and identify healthy behaviors to address their needs.    Participation Level:  Active  Participation Quality:  Appropriate  Affect:  Appropriate  Cognitive:  Appropriate  Insight: Appropriate  Engagement in Group:  Engaged  Modes of Intervention:  Education  Additional Comments:    [name] 05/15/2023, 8:53 PM

## 2023-05-15 NOTE — ED Provider Notes (Addendum)
 FBC/OBS ASAP Discharge Summary  Date and Time: 05/15/2023 8:31 AM  Name: [name]  MRN:  [id number]   Discharge Diagnoses:  Final diagnoses:  MDD (major depressive disorder), recurrent, severe, with psychosis (HCC)    -Patient under involuntary commitment.  Patient to be transported to [facility] geropsychiatry unit.  Subjective: Patient was seen and evaluated face-to-face by this provider.  He denied suicidal or homicidal ideations during this assessment.  Patient is under involuntary commitment due to suicidal ideations.  Patient has been accepted to inpatient admission at [facility] geropsychiatry unit, awaiting for completion of involuntary commitment.  Transportation via [employer].  Support encouragement, reassurance was provided.  Stay Summary: Per admission assessment note for [facility]: : [name], a 68 year old male, presented to the [facility]) as a voluntary walk-in, unaccompanied. The patient reports ongoing chronic pain and passive suicidal ideation, which he attributes to the intensity of his pain. He states, "Wouldn't you have suicidal thoughts if you were in my position, pain every day?" He disclosed a past suicide attempt in 1987 by heroin overdose, though he clarified that it was not fatal. He denies a family history of suicide but reports, per chart review, that his grandfather died by suicide.   Total Time spent with patient: 15 minutes  Past Psychiatric History: Charted history with depression with psychosis, anxiety and bipolar disorder Past Medical History: Hyperlipidemia, hypertension chronic pain and shortness of breath Family History: See chart Family Psychiatric History: See chart Social History: See chart Tobacco Cessation:  N/A, patient does not currently use tobacco products  Current Medications:  Current Facility-Administered Medications  Medication Dose Route Frequency Provider Last Rate Last Admin   acetaminophen   (TYLENOL ) tablet 650 mg  650 mg Oral Q6H PRN Wilkie Majel RAMAN, FNP   650 mg at 05/15/23 0557   alum & mag hydroxide-simeth (MAALOX/MYLANTA) 200-200-20 MG/5ML suspension 30 mL  30 mL Oral Q4H PRN Starkes-Perry, Majel RAMAN, FNP       DULoxetine  (CYMBALTA ) DR capsule 30 mg  30 mg Oral Daily Starkes-Perry, Majel RAMAN, FNP       magnesium  hydroxide (MILK OF MAGNESIA) suspension 30 mL  30 mL Oral Daily PRN Starkes-Perry, Takia S, FNP       QUEtiapine  (SEROQUEL ) tablet 25 mg  25 mg Oral QHS PRN Wilkie Majel RAMAN, FNP       Current Outpatient Medications  Medication Sig Dispense Refill   acetaminophen  (TYLENOL ) 325 MG tablet Take 2 tablets (650 mg total) by mouth every 6 (six) hours as needed for mild pain (pain score 1-3). 1 tablet 0   DULoxetine  (CYMBALTA ) 30 MG capsule Take 1 capsule (30 mg total) by mouth daily.     QUEtiapine  (SEROQUEL ) 25 MG tablet Take 1 tablet (25 mg total) by mouth at bedtime as needed (insomnia, anxiety, agitation).      PTA Medications:  Facility Ordered Medications  Medication   acetaminophen  (TYLENOL ) tablet 650 mg   alum & mag hydroxide-simeth (MAALOX/MYLANTA) 200-200-20 MG/5ML suspension 30 mL   magnesium  hydroxide (MILK OF MAGNESIA) suspension 30 mL   DULoxetine  (CYMBALTA ) DR capsule 30 mg   QUEtiapine  (SEROQUEL ) tablet 25 mg       05/14/2023    5:33 PM 03/31/2023   11:21 AM 03/31/2023   11:18 AM  Depression screen PHQ 2/9  Decreased Interest 2 3 3   Down, Depressed, Hopeless 2 3 3   PHQ - 2 Score 4 6 6   Altered sleeping   3  Tired, decreased  energy   3  Change in appetite   0  Feeling bad or failure about yourself    0  Trouble concentrating   0  Moving slowly or fidgety/restless   3  Suicidal thoughts   0  PHQ-9 Score   15  Difficult doing work/chores Somewhat difficult  Very difficult    Flowsheet Row ED from 05/14/2023 in [facility] Most recent reading at 05/15/2023  2:39 AM ED from 05/14/2023 in [facility] Most recent reading at 05/14/2023  2:10 AM ED from 05/13/2023 in [facility] Emergency Department at [facility] Most recent reading at 05/13/2023  8:42 PM  C-SSRS RISK CATEGORY Moderate Risk Low Risk Low Risk       Musculoskeletal  Strength & Muscle Tone: within normal limits Gait & Station: normal Patient leans: N/A  Psychiatric Specialty Exam  Presentation  General Appearance:  Appropriate for Environment  Eye Contact: Minimal  Speech: Pressured  Speech Volume: Increased  Handedness: Right   Mood and Affect  Mood: Anxious; Depressed; Dysphoric; Hopeless  Affect: Tearful; Congruent   Thought Process  Thought Processes: Coherent  Descriptions of Associations:Circumstantial  Orientation:Full (Time, Place and Person)  Thought Content:WDL  Diagnosis of Schizophrenia or Schizoaffective disorder in past: No    Hallucinations:Hallucinations: None  Ideas of Reference:None  Suicidal Thoughts:Suicidal Thoughts: Yes, Passive  Homicidal Thoughts:Homicidal Thoughts: No   Sensorium  Memory: Immediate Fair; Recent Fair; Remote Fair  Judgment: Fair  Insight: Fair   Art Therapist  Concentration: Fair  Attention Span: Fair  Recall: [employer] of Knowledge: Fair  Language: Fair   Psychomotor Activity  Psychomotor Activity: Psychomotor Activity: Restlessness   Assets  Assets: Communication Skills; Desire for Improvement; Housing   Sleep  Sleep: Sleep: Poor Number of Hours of Sleep: 3   Nutritional Assessment (For OBS and FBC admissions only) Has the patient had a weight loss or gain of 10 pounds or more in the last 3 months?: No Has the patient had a decrease in food intake/or appetite?: No Does the patient have dental problems?: No Does the patient have eating habits or behaviors that may be indicators of an eating disorder including binging or inducing vomiting?: No Has the patient recently  lost weight without trying?: 0 Has the patient been eating poorly because of a decreased appetite?: 0 Malnutrition Screening Tool Score: 0    Physical Exam  Physical Exam Vitals and nursing note reviewed.  Cardiovascular:     Rate and Rhythm: Normal rate and regular rhythm.  Neurological:     Mental Status: He is alert and oriented to person, place, and time.  Psychiatric:        Mood and Affect: Mood normal.   ROS Blood pressure (!) 132/97, pulse 75, temperature 97.7 F (36.5 C), temperature source Oral, resp. rate 18, SpO2 97%. There is no height or weight on file to calculate BMI.  Demographic Factors:  Male  Loss Factors: NA  Historical Factors: NA  Risk Reduction Factors:   NA  Continued Clinical Symptoms:  Severe Anxiety and/or Agitation Depression:   Anhedonia  Cognitive Features That Contribute To Risk:  Closed-mindedness    Suicide Risk:  Severe:  Frequent, intense, and enduring suicidal ideation, specific plan, no subjective intent, but some objective markers of intent (i.e., choice of lethal method), the method is accessible, some limited preparatory behavior, evidence of impaired self-control, severe dysphoria/symptomatology, multiple risk factors present, and few if any protective factors,  particularly a lack of social support.  Plan Of Care/Follow-up recommendations:  Activity:  as tolerated Diet:  heart healthy  Disposition: Patient accepted to geropsychiatry [facility] under involuntary commitment.  Staci LOISE Kerns, NP 05/15/2023, 8:31 AM

## 2023-05-16 DIAGNOSIS — F332 Major depressive disorder, recurrent severe without psychotic features: Secondary | ICD-10-CM

## 2023-05-16 NOTE — BHH Suicide Risk Assessment (Signed)
 [facility] Admission Suicide Risk Assessment   Nursing information obtained from:  Patient Demographic factors:  Male, Age 68 or older, Divorced or widowed, Caucasian, Living alone, Unemployed Current Mental Status:  Suicidal ideation indicated by patient Loss Factors:  Decline in physical health Historical Factors:  Prior suicide attempts, Family history of suicide Risk Reduction Factors:  Religious beliefs about death  Principal Problem: MDD (major depressive disorder), recurrent episode, severe (HCC) Diagnosis:  Principal Problem:   MDD (major depressive disorder), recurrent episode, severe (HCC)  Subjective Data: [name], a 68 year old male, presented to the [facility]) as a voluntary walk-in, unaccompanied. The patient reports ongoing chronic pain and passive suicidal ideation,   Alcohol Use Disorder Identification Test Final Score (AUDIT): 0 The Alcohol Use Disorders Identification Test, Guidelines for Use in Primary Care, Second Edition.  World Science Writer [facility]). Score between 0-7:  no or low risk or alcohol related problems. Score between 8-15:  moderate risk of alcohol related problems. Score between 16-19:  high risk of alcohol related problems. Score 20 or above:  warrants further diagnostic evaluation for alcohol dependence and treatment.   CLINICAL FACTORS:   Depression:   Anhedonia Hopelessness Impulsivity   Musculoskeletal: Strength & Muscle Tone: within normal limits Gait & Station: normal Patient leans: N/A  Psychiatric Specialty Exam:  Presentation  General Appearance:  Appropriate for Environment  Eye Contact: Fair  Speech: Other (comment) (at times scanned) Scanning speech?  Speech Volume: Normal  Handedness: Right   Mood and Affect  Mood: Anxious; Depressed  Affect: Congruent   Thought Process  Thought Processes: Coherent; Goal Directed  Descriptions of Associations:Intact  Orientation:Full  (Time, Place and Person)  Thought Content:Abstract Reasoning  History of Schizophrenia/Schizoaffective disorder:No  Duration of Psychotic Symptoms:NA Hallucinations:Hallucinations: None  Ideas of Reference:None  Suicidal Thoughts:Suicidal Thoughts: Yes, Passive  Homicidal Thoughts:Homicidal Thoughts: No   Sensorium  Memory: Immediate Fair; Recent Fair  Judgment: Limited  Insight: Shallow   Executive Functions  Concentration: Fair  Attention Span: Fair  Recall: Fair  Fund of Knowledge: Fair  Language: Fair   Psychomotor Activity  Psychomotor Activity:Psychomotor Activity: Decreased   Assets  Assets: Communication Skills; Desire for Improvement; Housing   Sleep  Sleep:Sleep: Fair    Physical Exam: Physical Exam Constitutional:      Appearance: Normal appearance.  HENT:     Head: Normocephalic and atraumatic.     Nose: No congestion.  Eyes:     Pupils: Pupils are equal, round, and reactive to light.  Cardiovascular:     Rate and Rhythm: Regular rhythm.  Pulmonary:     Effort: Pulmonary effort is normal.  Skin:    General: Skin is warm.  Neurological:     General: No focal deficit present.     Mental Status: He is alert and oriented to person, place, and time.    Review of Systems  Constitutional:  Negative for chills and fever.  HENT:  Negative for sore throat and tinnitus.   Eyes:  Negative for blurred vision and double vision.  Respiratory:  Negative for cough and shortness of breath.   Cardiovascular:  Negative for chest pain and palpitations.  Gastrointestinal:  Negative for nausea and vomiting.  Neurological:  Negative for dizziness and weakness.  Psychiatric/Behavioral:  Positive for depression and suicidal ideas. The patient is nervous/anxious.    Blood pressure (!) 132/92, pulse 75, temperature 98.7 F (37.1 C), resp. rate 15, height 5' 6 (1.676 m), weight 68 kg, SpO2 97%.  Body mass index is 24.21 kg/m.   COGNITIVE  FEATURES THAT CONTRIBUTE TO RISK:  Thought constriction (tunnel vision)    SUICIDE RISK:   Moderate:  Frequent suicidal ideation with limited intensity, and duration, no associated intent, good self-control,   PLAN OF CARE: Per H&P  I certify that inpatient services furnished can reasonably be expected to improve the patient's condition.   Samuel Harries, MD 05/16/2023, 12:43 PM

## 2023-05-16 NOTE — Progress Notes (Signed)
 Patient is cooperative with staff.  Endorses anxiety and pain rated 10/10.   Denies SI/HI and AVH.  Contracts for safety on the unit.  Reports poor sleep.    Compliant with scheduled medication.  Given early due to elevated BP.  15 min checks in place for safety.  Patient is present in the milieu.  Appropriate interaction with peers.   Patient is ordered 1:1 sitter at night while using CPAP.

## 2023-05-16 NOTE — Plan of Care (Addendum)
 Patient alert and oriented. Denies SI, HI, AVH. Pain 10/10 pt prefers tylenol  which was administered w/ scheduled medications per [facility]. Sleep aid offered, but refused pt stated It just don't help.Support and encouragement provided.  Routine safety checks conducted every 15 minutes.  Patient informed to notify staff with problems or concerns. No adverse drug reactions noted. Patient verbally contracts for safety at this time. Patient interacts well with others on the unit.  Patient remains safe at this time.  Problem: Education: Goal: Ability to make informed decisions regarding treatment will improve Outcome: Not Progressing   Problem: Coping: Goal: Coping ability will improve Outcome: Not Progressing   Problem: Health Behavior/Discharge Planning: Goal: Identification of resources available to assist in meeting health care needs will improve Outcome: Not Progressing   Problem: Medication: Goal: Compliance with prescribed medication regimen will improve Outcome: Not Progressing   Problem: Self-Concept: Goal: Ability to disclose and discuss suicidal ideas will improve Outcome: Not Progressing Goal: Will verbalize positive feelings about self Outcome: Not Progressing Note: Patient is not on track and improving. Patient will work on increased adherence, avoid flare triggers, and be evaluated at upcoming provider appointment to assess progress

## 2023-05-16 NOTE — Progress Notes (Signed)
 Patient alert and oriented. Flat affect. C/o H/A. Tylenol  650 mg po given at 1945 PRN for pain/discomfort. Visible in the dayroom. Interacting peers and staff. Attends groups. Scheduled medications administered to patient, per MD orders. CPAP w/ 1:1 sitter in place. No s/s of resp distress noted. Routine safety checks conducted every 15 minutes. Denies SI/HI/AVH noted.  Patient remains safe at this time.

## 2023-05-16 NOTE — Progress Notes (Signed)
   05/16/23 1200  Spiritual Encounters  Type of Visit Initial  Care provided to: Patient  Conversation partners present during encounter Nurse  Referral source Chaplain assessment  Reason for visit Routine spiritual support  OnCall Visit No  Spiritual Framework  Presenting Themes Meaning/purpose/sources of inspiration;Impactful experiences and emotions;Courage hope and growth;Coping tools  Patient Stress Factors Health changes  Interventions  Spiritual Care Interventions Made Established relationship of care and support;Compassionate presence;Encouragement  Intervention Outcomes  Outcomes Reduced fear;Reduced anxiety   Chaplain sat with patient in the dayroom and talked with him. Patient talked with the chaplain about his big brother and their childhood. The patient was very happy to talk with the chaplain and thanked me for checking on him.

## 2023-05-16 NOTE — Group Note (Signed)
 Recreation Therapy Group Note   Group Topic:General Recreation  Group Date: 05/16/2023 Start Time: 1500 End Time: 1600 Facilitators: Celestia Jeoffrey BRAVO, LRT, CTRS Location: Courtyard  Group Description: Outdoor Recreation. Patients had the option to play corn hole, ring toss, bowling or listening to music while outside in the courtyard getting fresh air and sunlight. LRT and patients discussed things that they enjoy doing in their free time outside of the hospital. LRT encouraged patients to drink water after being active and getting their heart rate up.   Goal Area(s) Addressed: Patient will identify leisure interests.  Patient will practice healthy decision making. Patient will engage in recreation activity.   Affect/Mood: Appropriate   Participation Level: Active and Engaged   Participation Quality: Independent   Behavior: Appropriate, Calm, and Cooperative   Speech/Thought Process: Coherent   Insight: Good   Judgement: Good   Modes of Intervention: Activity, Socialization, and Support   Patient Response to Interventions:  Attentive, Engaged, Interested , and Receptive   Education Outcome:  Acknowledges education   Clinical Observations/Individualized Feedback: [name] was active in their participation of session activities and group discussion. Pt chose to listen to music and talk with peers while outside. 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 Pt interacted well with LRT and peers duration of session.    Plan: Continue to engage patient in RT group sessions 2-3x/week.   [id number] LRT, CTRS 05/16/2023 5:22 PM

## 2023-05-16 NOTE — BH IP Treatment Plan (Signed)
 Interdisciplinary Treatment and Diagnostic Plan Update  05/16/2023 Time of Session: 10:13 AM  [name] MRN: [id number]  Principal Diagnosis: MDD (major depressive disorder), recurrent episode, severe (HCC)  Secondary Diagnoses: Principal Problem:   MDD (major depressive disorder), recurrent episode, severe (HCC)   Current Medications:  Current Facility-Administered Medications  Medication Dose Route Frequency Provider Last Rate Last Admin   acetaminophen  (TYLENOL ) tablet 650 mg  650 mg Oral Q6H PRN Wilkie Majel RAMAN, FNP   650 mg at 05/16/23 0805   alum & mag hydroxide-simeth (MAALOX/MYLANTA) 200-200-20 MG/5ML suspension 30 mL  30 mL Oral Q4H PRN Starkes-Perry, Takia S, FNP       magnesium  hydroxide (MILK OF MAGNESIA) suspension 30 mL  30 mL Oral Daily PRN Starkes-Perry, Majel RAMAN, FNP       OLANZapine  (ZYPREXA ) injection 5 mg  5 mg Intramuscular TID PRN Starkes-Perry, Majel RAMAN, FNP       OLANZapine  zydis (ZYPREXA ) disintegrating tablet 5 mg  5 mg Oral TID PRN Starkes-Perry, Takia S, FNP       propranolol  (INDERAL ) tablet 20 mg  20 mg Oral BID Parmar, Meenakshi, MD   20 mg at 05/16/23 0802   traZODone  (DESYREL ) tablet 100 mg  100 mg Oral QHS PRN Starkes-Perry, Takia S, FNP       PTA Medications: Medications Prior to Admission  Medication Sig Dispense Refill Last Dose/Taking   acetaminophen  (TYLENOL ) 325 MG tablet Take 2 tablets (650 mg total) by mouth every 6 (six) hours as needed for mild pain (pain score 1-3). 1 tablet 0    DULoxetine  (CYMBALTA ) 30 MG capsule Take 1 capsule (30 mg total) by mouth daily.      QUEtiapine  (SEROQUEL ) 25 MG tablet Take 1 tablet (25 mg total) by mouth at bedtime as needed (insomnia, anxiety, agitation).       Patient Stressors: Health problems   Other: chronic pain     Patient Strengths: Ability for insight  Religious Affiliation   Treatment Modalities: Medication Management, Group therapy, Case management,  1 to 1 session with clinician,  Psychoeducation, Recreational therapy.   Physician Treatment Plan for Primary Diagnosis: MDD (major depressive disorder), recurrent episode, severe (HCC) Long Term Goal(s):     Short Term Goals:    Medication Management: Evaluate patient's response, side effects, and tolerance of medication regimen.  Therapeutic Interventions: 1 to 1 sessions, Unit Group sessions and Medication administration.  Evaluation of Outcomes: Not Progressing  Physician Treatment Plan for Secondary Diagnosis: Principal Problem:   MDD (major depressive disorder), recurrent episode, severe (HCC)  Long Term Goal(s):     Short Term Goals:       Medication Management: Evaluate patient's response, side effects, and tolerance of medication regimen.  Therapeutic Interventions: 1 to 1 sessions, Unit Group sessions and Medication administration.  Evaluation of Outcomes: Not Progressing   RN Treatment Plan for Primary Diagnosis: MDD (major depressive disorder), recurrent episode, severe (HCC) Long Term Goal(s): Knowledge of disease and therapeutic regimen to maintain health will improve  Short Term Goals: Ability to remain free from injury will improve, Ability to verbalize frustration and anger appropriately will improve, Ability to demonstrate self-control, Ability to participate in decision making will improve, Ability to verbalize feelings will improve, Ability to disclose and discuss suicidal ideas, Ability to identify and develop effective coping behaviors will improve, and Compliance with prescribed medications will improve  Medication Management: RN will administer medications as ordered by provider, will assess and evaluate patient's response and provide education to patient  for prescribed medication. RN will report any adverse and/or side effects to prescribing provider.  Therapeutic Interventions: 1 on 1 counseling sessions, Psychoeducation, Medication administration, Evaluate responses to treatment,  Monitor vital signs and CBGs as ordered, Perform/monitor CIWA, COWS, AIMS and Fall Risk screenings as ordered, Perform wound care treatments as ordered.  Evaluation of Outcomes: Not Progressing   LCSW Treatment Plan for Primary Diagnosis: MDD (major depressive disorder), recurrent episode, severe (HCC) Long Term Goal(s): Safe transition to appropriate next level of care at discharge, Engage patient in therapeutic group addressing interpersonal concerns.  Short Term Goals: Engage patient in aftercare planning with referrals and resources, Increase social support, Increase ability to appropriately verbalize feelings, Increase emotional regulation, Facilitate acceptance of mental health diagnosis and concerns, Facilitate patient progression through stages of change regarding substance use diagnoses and concerns, Identify triggers associated with mental health/substance abuse issues, and Increase skills for wellness and recovery  Therapeutic Interventions: Assess for all discharge needs, 1 to 1 time with Social worker, Explore available resources and support systems, Assess for adequacy in community support network, Educate family and significant other(s) on suicide prevention, Complete Psychosocial Assessment, Interpersonal group therapy.  Evaluation of Outcomes: Not Progressing   Progress in Treatment: Attending groups: Yes. and No. Participating in groups: Yes. and No. Taking medication as prescribed: Yes. Toleration medication: Yes. Family/Significant other contact made: No, will contact:  CSW will contact if given permission  Patient understands diagnosis: Yes. Discussing patient identified problems/goals with staff: Yes. Medical problems stabilized or resolved: No. Denies suicidal/homicidal ideation: No. Issues/concerns per patient self-inventory: No. Other: None   New problem(s) identified: No, Describe:  None identified   New Short Term/Long Term Goal(s): detox, elimination of  symptoms of psychosis, medication management for mood stabilization; elimination of SI thoughts; development of comprehensive mental wellness/sobriety plan.   Patient Goals:  : I just want to die as easy as possible... I don't got the heart to be around here anymore   Discharge Plan or Barriers: CSW will assist with appropriate discharge planning   Reason for Continuation of Hospitalization: Depression Medication stabilization  Estimated Length of Stay: 1 to 7 days   Last 3 [place] Suicide Severity Risk Score: Flowsheet Row Admission (Current) from 05/15/2023 in [facility] Most recent reading at 05/15/2023  1:00 PM ED from 05/14/2023 in [facility] Most recent reading at 05/15/2023  2:39 AM ED from 05/14/2023 in [facility] Most recent reading at 05/14/2023  2:10 AM  C-SSRS RISK CATEGORY Low Risk Moderate Risk Low Risk       Last PHQ 2/9 Scores:    05/14/2023    5:33 PM 03/31/2023   11:21 AM 03/31/2023   11:18 AM  Depression screen PHQ 2/9  Decreased Interest 2 3 3   Down, Depressed, Hopeless 2 3 3   PHQ - 2 Score 4 6 6   Altered sleeping   3  Tired, decreased energy   3  Change in appetite   0  Feeling bad or failure about yourself    0  Trouble concentrating   0  Moving slowly or fidgety/restless   3  Suicidal thoughts   0  PHQ-9 Score   15  Difficult doing work/chores Somewhat difficult  Very difficult    Scribe for Treatment Team: Lum JONETTA Croft, ISRAEL 05/16/2023 10:54 AM

## 2023-05-16 NOTE — Plan of Care (Signed)
   Problem: Coping: Goal: Coping ability will improve Outcome: Progressing   Problem: Medication: Goal: Compliance with prescribed medication regimen will improve Outcome: Progressing

## 2023-05-16 NOTE — H&P (Signed)
 Psychiatric Admission Assessment Adult  Patient Identification: [name] MRN:  [id number] Date of Evaluation:  05/16/2023 Chief Complaint:  MDD (major depressive disorder), recurrent episode, severe (HCC) [F33.2] Principal Diagnosis: MDD (major depressive disorder), recurrent episode, severe (HCC) Diagnosis:  Principal Problem:   MDD (major depressive disorder), recurrent episode, severe (HCC)  History of Present Illness:  [name], a 67 year old male, presented to the [facility]) as a voluntary walk-in, unaccompanied. The patient reports ongoing chronic pain and passive suicidal ideation,  Chart reviewed, case discussed in multidisciplinary meeting.  Today patient was seen in treatment team meeting with social worker and nurse practitioner, he was also seen during a.m. rounds.  Patient talked about her stressors including chronic pain.  He mentioned that he takes turmeric and other herbal supplements for pain.  He said he does not believe in taking any medicine because he had bad experience with medicines in the past.  He claims that his scanned speech and breathing problems are a result of taking medicines in the past.  Patient endorses depressed mood, anhedonia, passive suicidal thoughts.  Patient said I do not want to harm myself or anybody else I want to live, but I am tired of this pain.  Patient was provided with support and reassurance.  Patient reports he has been diagnosed with bipolar disorder in the past.  He has been on mood stabilizer and antidepressants in the past.  At this point in time he does not want to take any psychotropic medicine.  Patient was encouraged to consider psychotherapy upon discharge.  He agrees to do so.  Patient also talked about an incident where he found his grandfather after the grandfather shot himself in the head.  Patient has really PTSD symptoms related to that incident.  Patient denies any intention or plan to harm himself on  the unit.  At present he denies any manic or psychotic symptoms.  Patient was encouraged to attend group and work on coping strategies.  Past Psychiatric History: Patient reports history of depression, anxiety, and PTSD.  Patient has been off medicine for 5 to 6 years now.  Patient reports that his scanning speech is result of side effects from medicines.  He reports that he at some point in time was also diagnosed with bipolar disorder.  He reports he has been on Depakote, Abilify , Seroquel , Prozac, Zoloft, trazodone , and Remeron . He disclosed a past suicide attempt in 1987 by heroin overdose, though he clarified that it was not fatal.     Is the patient at risk to self? Yes.    Has the patient been a risk to self in the past 6 months? No.  Has the patient been a risk to self within the distant past? Yes.    Is the patient a risk to others? No.  Has the patient been a risk to others in the past 6 months? No.  Has the patient been a risk to others within the distant past? No.   [place] Scale:  Flowsheet Row Admission (Current) from 05/15/2023 in [facility]IC[facility] Most recent reading at 05/15/2023  1:00 PM ED from 05/14/2023 in [facility] Most recent reading at 05/15/2023  2:39 AM ED from 05/14/2023 in [facility] Most recent reading at 05/14/2023  2:10 AM  C-SSRS RISK CATEGORY Low Risk Moderate Risk Low Risk        Prior Inpatient Therapy: Yes.    Prior Outpatient Therapy: Yes.  Alcohol Screening: 1. How often do you have a drink containing alcohol?: Never 2. How many drinks containing alcohol do you have on a typical day when you are drinking?: 1 or 2 3. How often do you have six or more drinks on one occasion?: Never AUDIT-C Score: 0 4. How often during the last year have you found that you were not able to stop drinking once you had started?: Never 5. How often during the last year have you failed to do what was  normally expected from you because of drinking?: Never 6. How often during the last year have you needed a first drink in the morning to get yourself going after a heavy drinking session?: Never 7. How often during the last year have you had a feeling of guilt of remorse after drinking?: Never 8. How often during the last year have you been unable to remember what happened the night before because you had been drinking?: Never 9. Have you or someone else been injured as a result of your drinking?: No 10. Has a relative or friend or a doctor or another health worker been concerned about your drinking or suggested you cut down?: No Alcohol Use Disorder Identification Test Final Score (AUDIT): 0 Alcohol Brief Interventions/Follow-up: Alcohol education/Brief advice Substance Abuse History in the last 12 months:  Yes.     THC use  Previous Psychotropic Medications: Yes   Past Medical History:  Past Medical History:  Diagnosis Date   Alcohol abuse    Allergy    Bipolar disorder (HCC)    Chronic pain    lower back pain, that has progressed   Depression    Diverticulosis    Dyspnea    Hepatitis C    hx. ofstates he no longer has is clear.   History of recreational drug use    cocaine, marijuana, polysubstance quit 1987.   HLD (hyperlipidemia)    Hypertension    Pancreatitis 2015   Panic attack    Sleep apnea    no cpap used,still has machine(use rarely)    Past Surgical History:  Procedure Laterality Date   CARDIAC CATHETERIZATION  2003   No CAD   EUS N/A 06/23/2014   Procedure: UPPER ENDOSCOPIC ULTRASOUND (EUS) LINEAR;  Surgeon: Toribio SHAUNNA Cedar, MD;  Location: [facility];  Service: Endoscopy;  Laterality: N/A;   sinus surgery     TONSILLECTOMY     Family History:  Family History  Problem Relation Age of Onset   Heart disease Mother    Kidney disease Mother    COPD Mother    Lupus Mother    Heart disease Father    Breast cancer Maternal Grandmother    Diabetes  Maternal Grandfather    Family Psychiatric  History: Patient reports that his grandfather shot himself in the head when patient was in his 30s.  Patient send the patient was found his grandfather dead.  Tobacco Screening:  Social History   Tobacco Use  Smoking Status Former   Current packs/day: 0.00   Average packs/day: 2.0 packs/day for 16.0 years (32.0 ttl pk-yrs)   Types: Cigarettes   Start date: 04/08/1986   Quit date: 04/08/2002   Years since quitting: 21.1   Passive exposure: Past  Smokeless Tobacco Never    BH Tobacco Counseling     Are you interested in Tobacco Cessation Medications?  N/A, patient does not use tobacco products Counseled patient on smoking cessation:  N/A, patient does not use tobacco products Reason Tobacco  Screening Not Completed: No value filed.       Social History:  Social History   Substance and Sexual Activity  Alcohol Use No   Alcohol/week: 0.0 standard drinks of alcohol   Comment: Quit in 1987     Social History   Substance and Sexual Activity  Drug Use Yes   Types: Marijuana    Additional Social History:  Patient reports that she lives in [place], he gets around $1250 disability                         Allergies:   Allergies  Allergen Reactions   Diazepam Other (See Comments)    Ringing in ears, diarrhea, CNS issues-multiple reactions  Neurotoxicity   Morphine  And Codeine Shortness Of Breath   Gabapentin  Other (See Comments)    Gets worse   Lab Results: No results found for this or any previous visit (from the past 48 hours).  Blood Alcohol level:  Lab Results  Component Value Date   ETH <10 05/13/2023   ETH <10 04/22/2023    Metabolic Disorder Labs:  Lab Results  Component Value Date   HGBA1C 5.5 03/24/2020   MPG 111.15 03/24/2020   MPG 111 09/15/2012   No results found for: PROLACTIN Lab Results  Component Value Date   CHOL 170 03/24/2020   TRIG 44 03/24/2020   HDL 62 03/24/2020   CHOLHDL 2.7  03/24/2020   VLDL 9 03/24/2020   LDLCALC 99 03/24/2020   LDLCALC 74 08/16/2019    Current Medications: Current Facility-Administered Medications  Medication Dose Route Frequency Provider Last Rate Last Admin   acetaminophen  (TYLENOL ) tablet 650 mg  650 mg Oral Q6H PRN Wilkie Majel RAMAN, FNP   650 mg at 05/16/23 0805   alum & mag hydroxide-simeth (MAALOX/MYLANTA) 200-200-20 MG/5ML suspension 30 mL  30 mL Oral Q4H PRN Starkes-Perry, Takia S, FNP       magnesium  hydroxide (MILK OF MAGNESIA) suspension 30 mL  30 mL Oral Daily PRN Starkes-Perry, Takia S, FNP       OLANZapine  (ZYPREXA ) injection 5 mg  5 mg Intramuscular TID PRN Starkes-Perry, Majel RAMAN, FNP       OLANZapine  zydis (ZYPREXA ) disintegrating tablet 5 mg  5 mg Oral TID PRN Wilkie Majel RAMAN, FNP       propranolol  (INDERAL ) tablet 20 mg  20 mg Oral BID Jayion Schneck, MD   20 mg at 05/16/23 9197   traZODone  (DESYREL ) tablet 100 mg  100 mg Oral QHS PRN Starkes-Perry, Takia S, FNP       PTA Medications: Medications Prior to Admission  Medication Sig Dispense Refill Last Dose/Taking   acetaminophen  (TYLENOL ) 325 MG tablet Take 2 tablets (650 mg total) by mouth every 6 (six) hours as needed for mild pain (pain score 1-3). 1 tablet 0    DULoxetine  (CYMBALTA ) 30 MG capsule Take 1 capsule (30 mg total) by mouth daily.      QUEtiapine  (SEROQUEL ) 25 MG tablet Take 1 tablet (25 mg total) by mouth at bedtime as needed (insomnia, anxiety, agitation).       Musculoskeletal: Strength & Muscle Tone: within normal limits Gait & Station: normal Patient leans: N/A   Psychiatric Specialty Exam:   Presentation  General Appearance:  Appropriate for Environment   Eye Contact: Fair   Speech: Other (comment) (at times scanned) Scanning ?   Speech Volume: Normal   Handedness: Right     Mood and Affect  Mood: Anxious;  Depressed   Affect: Congruent     Thought Process  Thought Processes: Coherent; Goal Directed    Descriptions of Associations:Intact   Orientation:Full (Time, Place and Person)   Thought Content:Abstract Reasoning   History of Schizophrenia/Schizoaffective disorder:No   Duration of Psychotic Symptoms:NA Hallucinations:Hallucinations: None   Ideas of Reference:None   Suicidal Thoughts:Suicidal Thoughts: Yes, Passive   Homicidal Thoughts:Homicidal Thoughts: No     Sensorium  Memory: Immediate Fair; Recent Fair   Judgment: Limited   Insight: Shallow     Executive Functions  Concentration: Fair   Attention Span: Fair   Recall: Fair   Fund of Knowledge: Fair   Language: Fair     Psychomotor Activity  Psychomotor Activity:Psychomotor Activity: Decreased     Assets  Assets: Communication Skills; Desire for Improvement; Housing     Sleep  Sleep:Sleep: Fair       Physical Exam: Physical Exam Constitutional:      Appearance: Normal appearance.  HENT:     Head: Normocephalic and atraumatic.     Nose: No congestion.  Eyes:     Pupils: Pupils are equal, round, and reactive to light.  Cardiovascular:     Rate and Rhythm: Regular rhythm.  Pulmonary:     Effort: Pulmonary effort is normal.  Skin:    General: Skin is warm.  Neurological:     General: No focal deficit present.     Mental Status: He is alert and oriented to person, place, and time.      Review of Systems  Constitutional:  Negative for chills and fever.  HENT:  Negative for sore throat and tinnitus.   Eyes:  Negative for blurred vision and double vision.  Respiratory:  Negative for cough and shortness of breath.   Cardiovascular:  Negative for chest pain and palpitations.  Gastrointestinal:  Negative for nausea and vomiting.  Neurological:  Negative for dizziness and weakness.  Psychiatric/Behavioral:  Positive for depression and Passive suicidal ideas. The patient is nervous/anxious.    Blood pressure (!) 132/92, pulse 75, temperature 98.7 F (37.1 C), resp. rate 15,  height 5' 6 (1.676 m), weight 68 kg, SpO2 97%. Body mass index is 24.21 kg/m.  Treatment Plan Summary: Daily contact with patient to assess and evaluate symptoms and progress in treatment and Medication management  Observation Level/Precautions:  15 minute checks  Laboratory:  CBC Chemistry Profile UDS  Psychotherapy:    Medications:    Consultations:    Discharge Concerns:    Estimated LOS: 4-5 days  Other:     Physician Treatment Plan for Primary Diagnosis: MDD (major depressive disorder), recurrent episode, severe (HCC) Long Term Goal(s): Improvement in symptoms so as ready for discharge  Short Term Goals: Ability to identify changes in lifestyle to reduce recurrence of condition will improve, Ability to verbalize feelings will improve, Ability to disclose and discuss suicidal ideas, Ability to demonstrate self-control will improve, Ability to identify and develop effective coping behaviors will improve, Ability to maintain clinical measurements within normal limits will improve, Compliance with prescribed medications will improve, and Ability to identify triggers associated with substance abuse/mental health issues will improve  Patient is admitted to locked unit under safety precautions At this point in time patient declined use of any psychotropic medicine Patient was encouraged to attend group and work on coping strategies Social worker consulted to get collateral and help with a safe discharge plan  I certify that inpatient services furnished can reasonably be expected to improve the patient's condition.  Wenceslao Harries, MD 2/7/20251:05 PM

## 2023-05-16 NOTE — Group Note (Signed)
 Recreation Therapy Group Note   Group Topic:Relaxation  Group Date: 05/16/2023 Start Time: 1100 End Time: 1135 Facilitators: Celestia Jeoffrey BRAVO, LRT, CTRS Location:  Dayroom  Group Description: Meditation. LRT and patients discussed what they know about meditation and mindfulness. LRT played a Deep Breathing Meditation exercise script for patients to follow along to. LRT and patients discussed how meditation and deep breathing can be used as a coping skill post--discharge to help manage symptoms of stress.   Goal Area(s) Addressed: Patient will practice using relaxation technique. Patient will identify a new coping skill.  Patient will follow multistep directions to reduce anxiety and stress.   Affect/Mood: Appropriate   Participation Level: Active and Engaged   Participation Quality: Independent   Behavior: Calm and Cooperative   Speech/Thought Process: Coherent   Insight: Good   Judgement: Good   Modes of Intervention: Activity, Education, Exploration, and Guided Discussion   Patient Response to Interventions:  Attentive, Engaged, Interested , and Receptive   Education Outcome:  Acknowledges education   Clinical Observations/Individualized Feedback: [name] was active in their participation of session activities and group discussion. Pt shared that he is familiar with meditation and often does it at home. Pt appropriately followed along to the prompts. Pt interacted well with LRT and peers duration of session.    Plan: Continue to engage patient in RT group sessions 2-3x/week.   Jeoffrey BRAVO Celestia, LRT, CTRS 05/16/2023 1:18 PM

## 2023-05-16 NOTE — Progress Notes (Signed)
 Occupational Therapy Group Note  Group Topic:  UE Exercise Group Date: 05/16/23 Group Time (start and end): 8699-8684 Facilitators: Warren Gill, OT; Josette Daring, PT  Group Description: Group instructed in series of upper extremities exercises, aimed to promote strength, flexibility, range of motion and functional endurance.  Patients provided cuing for proper mechanics and proper pace of exercise; exercises adjusted as necessary for individualized patient needs.  Patient also engaged in cognitive components throughout session, working to integrate attention to task, command following, and appropriate social interaction throughout session.  Allowed to ask questions as appropriate, and encouraged to identify specific exercises that they could complete independently outside of group sessions.   Therapeutic Goal(s): Demonstrate appropriate performance of upper extremity exercises to promote strength, flexibility, range of motion and functional endurance. Identify 2-3 specific upper extremity exercises to complete as home exercise program outside of group session.   Individual Participation: Pt demonstrated some limitations in proper technique but politely declined modifications and selected to sit through exercises that he self reported as aggravating to him.     Participation Level & Quality: Pt pleasant and cooperate. Receptive to instruction and modifications.   Behavior: Appropriate, engaged.   Affect/Mood: Appropriate.   Modes of Intervention: Verbal demonstration, visual demonstration, handout provided to support recall and carryover.   Patient Response to Interventions:  Appreciative of instruction. Left group halfway through, despite encouragement and opportunity for modifications. Politely declined.      Plan: Continue to engage patient in PT/OT groups 1-2x/week.    Ellie Bryand R., MPH, MS, OTR/L ascom [phone] 05/16/23, 1:45 PM

## 2023-05-17 DIAGNOSIS — F332 Major depressive disorder, recurrent severe without psychotic features: Secondary | ICD-10-CM | POA: Diagnosis not present

## 2023-05-17 NOTE — Progress Notes (Signed)
   05/17/23 0030  BH Safety Checks  Location Bedroom (Patient removed Cpap stated Its giving me more trouble than help; I have to take it off I'm sorry; I thought it would help.)  Visual Appearance Fearful/Worried (I've had all types of test, but nothing can tell me what's wrong.)  Behavior Anxious (Refuses to take anything for anxiety)

## 2023-05-17 NOTE — Group Note (Signed)
 Date:  05/17/2023 Time:  9:28 PM  Group Topic/Focus:  Healthy Communication:   The focus of this group is to discuss communication, barriers to communication, as well as healthy ways to communicate with others.    Participation Level:  Active  Participation Quality:  Appropriate  Affect:  Appropriate  Cognitive:  Appropriate  Insight: Appropriate and Good  Engagement in Group:  Engaged  Modes of Intervention:  Education  Additional Comments:    [name] 05/17/2023, 9:28 PM

## 2023-05-17 NOTE — Plan of Care (Signed)
  Problem: Education: Goal: Ability to make informed decisions regarding treatment will improve Outcome: Progressing   Problem: Coping: Goal: Coping ability will improve Outcome: Progressing  Problem: Self-Concept: Goal: Ability to disclose and discuss suicidal ideas will improve 05/17/2023 1128 by Ann Orlean LABOR, RN Outcome: Progressing 05/17/2023 1127 by Ann Orlean LABOR, RN Outcome: Progressing Goal: Will verbalize positive feelings about self 05/17/2023 1128 by Ann Orlean LABOR, RN Outcome: Progressing Note: Patient is not on track and improving. Patient will work on increased adherence  05/17/2023 1127 by Ann Orlean LABOR, RN Outcome: Progressing Note: Patient is on track. Patient will work toward meeting this goal by getting generalized pain under control    Problem: Medication: Goal: Compliance with prescribed medication regimen will improve Outcome: Progressing

## 2023-05-17 NOTE — Progress Notes (Signed)
   05/17/23 0600  15 Minute Checks  Location Hallway  Visual Appearance Angry/Irritable  Behavior Anxious;Agitated  Sleep (Behavioral Health Patients Only)  Calculate sleep? (Click Yes once per 24 hr at 0600 safety check) Yes  Documented sleep last 24 hours 5.75

## 2023-05-17 NOTE — Progress Notes (Signed)
   05/16/23 2124  Psych Admission Type (Psych Patients Only)  Admission Status Involuntary  Psychosocial Assessment  Patient Complaints Anxiety  Eye Contact Fair  Facial Expression Anxious  Affect Anxious  Speech Logical/coherent  Interaction Assertive  Motor Activity Slow  Appearance/Hygiene Unremarkable  Behavior Characteristics Cooperative;Appropriate to situation  Mood Anxious;Pleasant  Aggressive Behavior  Effect No apparent injury  Thought Process  Coherency Tangential  Content Preoccupation  Delusions None reported or observed  Perception WDL  Hallucination None reported or observed  Judgment Impaired  Confusion None  Danger to Self  Current suicidal ideation? Denies  Agreement Not to Harm Self Yes  Description of Agreement Verbal  Danger to Others  Danger to Others None reported or observed
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
## 2023-05-17 NOTE — Progress Notes (Signed)
 Assumed care of patient this am, he presented A&O x 4, affect & mood, depressed and report generalized pain is what's contributing to present mental status. Pt out in the milieu, sociable  at times , paces the hallway @ other times, it helps to relieve what I'm feeling. Pt behavior seems appropriate to situation and thoughts seems clear and intact. Pt denied thoughts, plan or intent to harm self or others and made a safety commitment. Pt is compliant with taking medications and following plan of care.Pt educated on plan of care, medication regimen and alternative ways to managing plan, he acknowledged an understanding and was without further complaints or concerns. Pt is managed on q 15 min rounds.

## 2023-05-18 DIAGNOSIS — F332 Major depressive disorder, recurrent severe without psychotic features: Secondary | ICD-10-CM | POA: Diagnosis not present

## 2023-05-18 MED ORDER — NAPROXEN 250 MG PO TABS
250.0000 mg | ORAL_TABLET | Freq: Two times a day (BID) | ORAL | Status: DC
  Administered 2023-05-18: 250 mg via ORAL
  Filled 2023-05-18 (×7): qty 1

## 2023-05-18 MED ORDER — HYDROXYZINE HCL 25 MG PO TABS
25.0000 mg | ORAL_TABLET | Freq: Four times a day (QID) | ORAL | Status: DC | PRN
  Administered 2023-05-18 (×2): 25 mg via ORAL
  Filled 2023-05-18 (×3): qty 1

## 2023-05-18 NOTE — Progress Notes (Signed)
   05/18/23 1000  Psych Admission Type (Psych Patients Only)  Admission Status Involuntary  Psychosocial Assessment  Patient Complaints Anxiety  Eye Contact Fair  Facial Expression Anxious  Affect Depressed  Speech Logical/coherent  Interaction Assertive  Motor Activity Slow  Appearance/Hygiene Unremarkable  Behavior Characteristics Cooperative;Anxious  Mood Depressed  Aggressive Behavior  Effect No apparent injury  Thought Process  Coherency Circumstantial  Content Preoccupation  Delusions None reported or observed  Perception WDL  Hallucination None reported or observed  Judgment Impaired  Confusion None  Danger to Self  Current suicidal ideation? Denies  Agreement Not to Harm Self Yes  Description of Agreement verbal  Danger to Others  Danger to Others None reported or observed

## 2023-05-18 NOTE — [facility] Suicide Risk Assessment (Signed)
 [facility] INPATIENT:  Family/Significant Other Suicide Prevention Education  Suicide Prevention Education:  Patient Refusal for Family/Significant Other Suicide Prevention Education: The patient [name] has refused to provide written consent for family/significant other to be provided Family/Significant Other Suicide Prevention Education during admission and/or prior to discharge.  Physician notified.  Bridget Cordella Simmonds 05/18/2023, 10:17 AM
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
## 2023-05-18 NOTE — Plan of Care (Signed)
  Problem: Education: Goal: Ability to make informed decisions regarding treatment will improve Outcome: Progressing   Problem: Coping: Goal: Coping ability will improve Outcome: Progressing   Problem: Health Behavior/Discharge Planning: Goal: Identification of resources available to assist in meeting health care needs will improve Outcome: Progressing   Problem: Medication: Goal: Compliance with prescribed medication regimen will improve Outcome: Progressing   Problem: Self-Concept: Goal: Ability to disclose and discuss suicidal ideas will improve Outcome: Progressing Goal: Will verbalize positive feelings about self Outcome: Progressing Note: Patient is on track. Patient will work on increased adherence

## 2023-05-18 NOTE — Progress Notes (Signed)
 [facility] MD Progress Note  05/18/2023  [name]  MRN:  [id number]  [name], a 68 year old male, presented to the [facility]) as a voluntary walk-in, unaccompanied. The patient reports ongoing chronic pain and passive suicidal ideation,   Subjective:  Chart reviewed, case discussed in multidisciplinary meeting. Today patient was seen during a.m. rounds.  Staff reported last night he was given as needed olanzapine  which helped him with anxiety and sleep.   Today during assessment patient endorsed anxious mood.  He was prescribed Vistaril  as needed as needed.  Patient is adamant about not taking any psychotropic medicine.  He continues to  complains of pain.    Patient was provided with support and reassurance.  He was once again encouraged to consider medicine for pain and anxiety. He agrees to try Vistaril  and Naproxen .  Patient was encouraged to attend group and work on coping strategies.  Today patient denies any intention or plan of harming himself.  He denies homicidal ideations.  He denies psychotic or manic symptoms.  Patient reports that he does not believe in his diagnosis of bipolar disorder.  Although he has been on Depakote, Abilify , and Seroquel  for mood stabilization in the past.  Principal Problem: MDD (major depressive disorder), recurrent episode, severe (HCC) Diagnosis: Principal Problem:   MDD (major depressive disorder), recurrent episode, severe (HCC)   Past Psychiatric History:  Patient reports history of depression, anxiety, and PTSD.  Patient has been off medicine for 5 to 6 years now.  Patient reports that his scanning speech is result of side effects from medicines.  He reports that he at some point in time was also diagnosed with bipolar disorder.  He reports he has been on Depakote, Abilify , Seroquel , Prozac, Zoloft, trazodone , and Remeron . He disclosed a past suicide attempt in 1987 by heroin overdose, though he clarified that it was not fatal.     Past Medical History:  Past Medical History:  Diagnosis Date   Alcohol abuse    Allergy    Bipolar disorder (HCC)    Chronic pain    lower back pain, that has progressed   Depression    Diverticulosis    Dyspnea    Hepatitis C    hx. ofstates he no longer has is clear.   History of recreational drug use    cocaine, marijuana, polysubstance quit 1987.   HLD (hyperlipidemia)    Hypertension    Pancreatitis 2015   Panic attack    Sleep apnea    no cpap used,still has machine(use rarely)    Past Surgical History:  Procedure Laterality Date   CARDIAC CATHETERIZATION  2003   No CAD   EUS N/A 06/23/2014   Procedure: UPPER ENDOSCOPIC ULTRASOUND (EUS) LINEAR;  Surgeon: Toribio SHAUNNA Cedar, MD;  Location: [facility];  Service: Endoscopy;  Laterality: N/A;   sinus surgery     TONSILLECTOMY     Family History:  Family History  Problem Relation Age of Onset   Heart disease Mother    Kidney disease Mother    COPD Mother    Lupus Mother    Heart disease Father    Breast cancer Maternal Grandmother    Diabetes Maternal Grandfather    Family Psychiatric  History: Patient reports that his grandfather shot himself in the head when patient was in his 30s. Patient send the patient was found his grandfather dead.  Social History:  Social History   Substance and Sexual Activity  Alcohol Use No  Alcohol/week: 0.0 standard drinks of alcohol   Comment: Quit in 1987     Social History   Substance and Sexual Activity  Drug Use Yes   Types: Marijuana    Social History   Socioeconomic History   Marital status: Divorced    Spouse name: Not on file   Number of children: 0   Years of education: GED   Highest education level: Not on file  Occupational History   Occupation: DISABLED   Occupation:    Tobacco Use   Smoking status: Former    Current packs/day: 0.00    Average packs/day: 2.0 packs/day for 16.0 years (32.0 ttl pk-yrs)    Types: Cigarettes    Start date:  04/08/1986    Quit date: 04/08/2002    Years since quitting: 21.1    Passive exposure: Past   Smokeless tobacco: Never  Vaping Use   Vaping status: Never Used  Substance and Sexual Activity   Alcohol use: No    Alcohol/week: 0.0 standard drinks of alcohol    Comment: Quit in 1987   Drug use: Yes    Types: Marijuana   Sexual activity: Not Currently    Partners: Female  Other Topics Concern   Not on file  Social History Narrative   Patient lives at home alone.    Disabled.   Education GED    Both handed.   Caffeine two cups of coffee daily.      Social Drivers of Corporate Investment Banker Strain: Low Risk  (03/31/2023)   Overall Financial Resource Strain (CARDIA)    Difficulty of Paying Living Expenses: Not hard at all  Food Insecurity: No Food Insecurity (05/15/2023)   Hunger Vital Sign    Worried About Running Out of Food in the Last Year: Never true    Ran Out of Food in the Last Year: Never true  Transportation Needs: No Transportation Needs (05/15/2023)   PRAPARE - Administrator, Civil Service (Medical): No    Lack of Transportation (Non-Medical): No  Physical Activity: Inactive (03/31/2023)   Exercise Vital Sign    Days of Exercise per Week: 0 days    Minutes of Exercise per Session: 0 min  Stress: No Stress Concern Present (03/31/2023)   [employer] of Occupational Health - Occupational Stress Questionnaire    Feeling of Stress : Not at all  Social Connections: Socially Isolated (05/15/2023)   Social Connection and Isolation Panel [NHANES]    Frequency of Communication with Friends and Family: More than three times a week    Frequency of Social Gatherings with Friends and Family: Once a week    Attends Religious Services: Never    Database Administrator or Organizations: No    Attends Engineer, Structural: Never    Marital Status: Divorced   Additional Social History:      Patient reports that he lives in [place], he gets around $1250  disability                     Sleep: Poor  Appetite:  Fair  Current Medications: Current Facility-Administered Medications  Medication Dose Route Frequency Provider Last Rate Last Admin   acetaminophen  (TYLENOL ) tablet 650 mg  650 mg Oral Q6H PRN Wilkie Majel RAMAN, FNP   650 mg at 05/18/23 0638   alum & mag hydroxide-simeth (MAALOX/MYLANTA) 200-200-20 MG/5ML suspension 30 mL  30 mL Oral Q4H PRN Wilkie Majel RAMAN, FNP  hydrOXYzine  (ATARAX ) tablet 25 mg  25 mg Oral Q6H PRN Victoria Ruts, MD       magnesium  hydroxide (MILK OF MAGNESIA) suspension 30 mL  30 mL Oral Daily PRN Starkes-Perry, Takia S, FNP       OLANZapine  (ZYPREXA ) injection 5 mg  5 mg Intramuscular TID PRN Starkes-Perry, Takia S, FNP       OLANZapine  zydis (ZYPREXA ) disintegrating tablet 5 mg  5 mg Oral TID PRN Starkes-Perry, Takia S, FNP   5 mg at 05/18/23 9361   propranolol  (INDERAL ) tablet 20 mg  20 mg Oral BID Arianah Torgeson, MD   20 mg at 05/18/23 1031   traZODone  (DESYREL ) tablet 100 mg  100 mg Oral QHS PRN Wilkie Majel RAMAN, FNP        Lab Results: No results found for this or any previous visit (from the past 48 hours).  Blood Alcohol level:  Lab Results  Component Value Date   ETH <10 05/13/2023   ETH <10 04/22/2023    Metabolic Disorder Labs: Lab Results  Component Value Date   HGBA1C 5.5 03/24/2020   MPG 111.15 03/24/2020   MPG 111 09/15/2012   No results found for: PROLACTIN Lab Results  Component Value Date   CHOL 170 03/24/2020   TRIG 44 03/24/2020   HDL 62 03/24/2020   CHOLHDL 2.7 03/24/2020   VLDL 9 03/24/2020   LDLCALC 99 03/24/2020   LDLCALC 74 08/16/2019    Musculoskeletal: Strength & Muscle Tone: within normal limits Gait & Station: normal Patient leans: N/A   Psychiatric Specialty Exam:   Presentation  General Appearance:  Appropriate for Environment   Eye Contact: Fair   Speech: Other (comment) (at times scanned) Scanning ?    Speech Volume: Normal   Handedness: Right     Mood and Affect  Mood: Anxious   Affect: Congruent     Thought Process  Thought Processes: Coherent; Goal Directed   Descriptions of Associations:Intact   Orientation:Full (Time, Place and Person)   Thought Content:Abstract Reasoning   History of Schizophrenia/Schizoaffective disorder:No   Duration of Psychotic Symptoms:NA Hallucinations:Hallucinations: None   Ideas of Reference:None   Suicidal Thoughts: Denies    Homicidal Thoughts: No     Sensorium  Memory: Immediate Fair; Recent Fair   Judgment: Limited   Insight: Shallow     Executive Functions  Concentration: Fair   Attention Span: Fair   Recall: Fair   Fund of Knowledge: Fair   Language: Fair     Psychomotor Activity  Psychomotor Activity:Psychomotor Activity: Decreased     Assets  Assets: Communication Skills; Desire for Improvement; Housing     Sleep  Sleep:Sleep: Fair       Physical Exam: Physical Exam Constitutional:      Appearance: Normal appearance.  HENT:     Head: Normocephalic and atraumatic.     Nose: No congestion.  Eyes:     Pupils: Pupils are equal, round, and reactive to light.  Cardiovascular:     Rate and Rhythm: Regular rhythm.  Pulmonary:     Effort: Pulmonary effort is normal.  Skin:    General: Skin is warm.  Neurological:     General: No focal deficit present.     Mental Status: He is alert and oriented to person, place, and time.      Review of Systems  Constitutional:  Negative for chills and fever.  HENT:  Negative for sore throat and tinnitus.   Eyes:  Negative for blurred vision and  double vision.  Respiratory:  Negative for cough and shortness of breath.   Cardiovascular:  Negative for chest pain and palpitations.  Gastrointestinal:  Negative for nausea and vomiting.  Neurological:  Negative for dizziness and weakness.   Blood pressure (!) 151/108, pulse 97, temperature (!) 97.5  F (36.4 C), resp. rate (!) 21, height 5' 6 (1.676 m), weight 68 kg, SpO2 95%. Body mass index is 24.21 kg/m.   Treatment Plan Summary: Daily contact with patient to assess and evaluate symptoms and progress in treatment  Patient is admitted to locked unit under safety precautions At this point in time patient declined use of any psychotropic medicine Patient was encouraged to attend group and work on coping strategies Social worker consulted to get collateral and help with a safe discharge plan  Wenceslao Harries, MD

## 2023-05-18 NOTE — BHH Group Notes (Signed)
 LCSW Wellness Group Note   05/18/2023 1:30pm  Type of Group and Topic: Psychoeducational Group:  Wellness  Participation Level:  Active  Description of Group  Wellness group introduces the topic and its focus on developing healthy habits across the spectrum and its relationship to a decrease in hospital admissions.  Six areas of wellness are discussed: physical, social spiritual, intellectual, occupational, and emotional.  Patients are asked to consider their current wellness habits and to identify areas of wellness where they are interested and able to focus on improvements.    Therapeutic Goals Patients will understand components of wellness and how they can positively impact overall health.  Patients will identify areas of wellness where they have developed good habits. Patients will identify areas of wellness where they would like to make improvements.    Summary of Patient Progress: pt attentive in group, did respond to CSW questions when called on.  Pt states that he was active in multiple wellness areas previously but that current pain issues prevent him from doing much that he would like to do.  Pt does identify some social support.  Physical wellness needs improvement currently.      Therapeutic Modalities: Cognitive Behavioral Therapy Psychoeducation    Bridget Cordella Simmonds, LCSW

## 2023-05-18 NOTE — Plan of Care (Signed)
   Problem: Education: Goal: Ability to make informed decisions regarding treatment will improve Outcome: Progressing   Problem: Coping: Goal: Coping ability will improve Outcome: Progressing   Problem: Medication: Goal: Compliance with prescribed medication regimen will improve Outcome: Progressing   Problem: Self-Concept: Goal: Ability to disclose and discuss suicidal ideas will improve Outcome: Progressing

## 2023-05-18 NOTE — Care Management Important Message (Signed)
 Important Message  Patient Details  Name: [name] MRN: [id number] Date of Birth: 14-Jun-1955   Important Message Given:  Yes - Medicare IM     Elspeth Hals, LCSW 05/18/2023, 10:19 AM

## 2023-05-19 DIAGNOSIS — F332 Major depressive disorder, recurrent severe without psychotic features: Secondary | ICD-10-CM | POA: Diagnosis not present

## 2023-05-19 NOTE — BH Assessment (Signed)
 Recreation Therapy Notes  INPATIENT RECREATION THERAPY ASSESSMENT  Patient Details Name: [name] MRN: [id number] DOB: Sep 07, 1955 Today's Date: 05/19/2023       Information Obtained From: Patient (In addition to chart review)  Able to Participate in Assessment/Interview: Yes  Patient Presentation: Hyperverbal, Tangential  Reason for Admission (Per Patient): Active Symptoms (I have so much pain)  Patient Stressors:  (pain)  Coping Skills:    (Unable to identify)  Leisure Interests (2+):   (Unable to identify)  Frequency of Recreation/Participation:  (Unable to identify)  Awareness of Community Resources:  Yes  Community Resources:  Restaurants  Current Use: No  Expressed Interest in [employer] Information: No  Patient Main Form of Transportation:  (Unable to identify)  Patient Strengths:  Nothing  Patient Identified Areas of Improvement:  Nothing  Patient Goal for Hospitalization:  to see a neurotoxicity doctor  Current SI (including self-harm):  No  Current HI:  No  Current AVH: No  Staff Intervention Plan: Group Attendance, Collaborate with Interdisciplinary Treatment Team  Consent to Intern Participation: N/A   [name] was extremely hyper-verbal during assessment. Pt discussed physical pain and ailments, as well as different medications he has been on. Pt shared that is it hard for him to do anything due to the pain. Pt shared that he is enjoying his time at the hospital as he usually is at home alone and being around others is nice.   [name] LRT, CTRS [name] [name] 05/19/2023, 7:55 AM

## 2023-05-19 NOTE — Group Note (Signed)
 Date:  05/19/2023 Time:  5:32 PM  Group Topic/Focus:  Orientation:   The focus of this group is to educate the patient on the purpose and policies of crisis stabilization and provide a format to answer questions about their admission.  The group details unit policies and expectations of patients while admitted.    Participation Level:  Active  Participation Quality:  Attentive  Affect:  Appropriate  Cognitive:  Alert, Appropriate, and Oriented  Insight: Appropriate  Engagement in Group:  Engaged  Modes of Intervention:  Orientation and Socialization  Additional Comments:     Fleet Huh 05/19/2023, 5:32 PM

## 2023-05-19 NOTE — Plan of Care (Signed)
 Alert and oriented X4, RA, intermittently compliant w/medication regimen. Medication use and side effects reviewed with patient to provide education to encourage compliance. Care, comfort and 15 min. safety checks maintained/ongoing.   Problem: Education: Goal: Ability to make informed decisions regarding treatment will improve Outcome: Not Progressing   Problem: Coping: Goal: Coping ability will improve Outcome: Not Progressing   Problem: Health Behavior/Discharge Planning: Goal: Identification of resources available to assist in meeting health care needs will improve Outcome: Not Progressing   Problem: Medication: Goal: Compliance with prescribed medication regimen will improve Outcome: Not Progressing   Problem: Self-Concept: Goal: Ability to disclose and discuss suicidal ideas will improve Outcome: Not Progressing Goal: Will verbalize positive feelings about self Outcome: Not Progressing Note: Patient is not on track and improving. Patient will work on increased adherence

## 2023-05-19 NOTE — Plan of Care (Signed)
  Problem: Education: Goal: Ability to make informed decisions regarding treatment will improve Outcome: Not Progressing   Problem: Coping: Goal: Coping ability will improve Outcome: Not Progressing   Problem: Health Behavior/Discharge Planning: Goal: Identification of resources available to assist in meeting health care needs will improve Outcome: Not Progressing   Problem: Medication: Goal: Compliance with prescribed medication regimen will improve Outcome: Not Progressing   Problem: Self-Concept: Goal: Ability to disclose and discuss suicidal ideas will improve Outcome: Not Progressing Goal: Will verbalize positive feelings about self Outcome: Not Progressing Note: Patient is not on track and no change. Patient will work on increased adherence

## 2023-05-19 NOTE — Progress Notes (Signed)
   05/19/23 0741  Psych Admission Type (Psych Patients Only)  Admission Status Involuntary  Psychosocial Assessment  Patient Complaints Anxiety  Eye Contact Brief  Facial Expression Animated;Anxious  Affect Anxious  Speech Logical/coherent  Interaction Assertive  Motor Activity Unsteady  Appearance/Hygiene Unremarkable  Behavior Characteristics Cooperative  Mood Pleasant  Thought Process  Coherency WDL  Content WDL  Delusions None reported or observed  Perception WDL  Hallucination None reported or observed  Judgment Impaired  Confusion None  Danger to Self  Current suicidal ideation? Denies

## 2023-05-19 NOTE — Progress Notes (Signed)
   05/18/23 2208  Psych Admission Type (Psych Patients Only)  Admission Status Involuntary  Psychosocial Assessment  Patient Complaints Anxiety  Eye Contact Fair  Facial Expression Anxious  Affect Anxious;Depressed  Speech Logical/coherent  Interaction Assertive  Motor Activity Slow  Appearance/Hygiene Unremarkable  Behavior Characteristics Cooperative;Anxious  Mood Depressed  Aggressive Behavior  Effect No apparent injury  Thought Process  Coherency Circumstantial  Content Preoccupation  Delusions None reported or observed  Perception WDL  Hallucination None reported or observed  Judgment Impaired  Confusion None  Danger to Self  Current suicidal ideation? Denies  Agreement Not to Harm Self Yes  Description of Agreement Verbal  Danger to Others  Danger to Others None reported or observed

## 2023-05-19 NOTE — Progress Notes (Signed)
   05/19/23 0315  Spiritual Encounters  Type of Visit Initial  Care provided to: Patient  Conversation partners present during encounter Nurse  OnCall Visit Yes   Chaplain visited patient who shared he has suicidal ideation.  Chaplain shared this with the staff.  Chaplain provided reflective listening, empathy and a compassionate presence.   Rev. Rana M. Nolon Baxter, MDiv. Chaplain Resident  [facility]

## 2023-05-19 NOTE — Progress Notes (Signed)
 [facility] MD Progress Note  05/19/2023 3:22 PM [name]  MRN:  [id number]  [name], a 68 year old male, presented to the [facility]) as a voluntary walk-in, unaccompanied. The patient reports ongoing chronic pain and passive suicidal ideation,  Subjective:  Chart reviewed, case discussed in multidisciplinary meeting, patient seen during rounds.  Patient comes to his room to talk to this provider.  He starts making some coughing sounds and starts pacing in the room.  Prior to talking to the daughter he was noted to be sitting quietly and chatting very normally with another peer.  Patient continues to talk about his history of being on psychotropic medications and the neurotoxicity he went through.  He gives extensive history of using benzodiazepines and stopping them abruptly and how it made his brain react differently.  Patient reports that he felt suicidal before coming to the hospital but denies current SI/HI/plan.  He is very focused on his pain management and is unable to come up with treatment goals as remains tangential about his past history of medications.  He denies auditory/visual hallucinations.   Sleep: Fair  Appetite:  Fair  Past Psychiatric History: see h&P Family History:  Family History  Problem Relation Age of Onset   Heart disease Mother    Kidney disease Mother    COPD Mother    Lupus Mother    Heart disease Father    Breast cancer Maternal Grandmother    Diabetes Maternal Grandfather    Social History:  Social History   Substance and Sexual Activity  Alcohol Use No   Alcohol/week: 0.0 standard drinks of alcohol   Comment: Quit in 1987     Social History   Substance and Sexual Activity  Drug Use Yes   Types: Marijuana    Social History   Socioeconomic History   Marital status: Divorced    Spouse name: Not on file   Number of children: 0   Years of education: GED   Highest education level: Not on file  Occupational History    Occupation: DISABLED   Occupation:    Tobacco Use   Smoking status: Former    Current packs/day: 0.00    Average packs/day: 2.0 packs/day for 16.0 years (32.0 ttl pk-yrs)    Types: Cigarettes    Start date: 04/08/1986    Quit date: 04/08/2002    Years since quitting: 21.1    Passive exposure: Past   Smokeless tobacco: Never  Vaping Use   Vaping status: Never Used  Substance and Sexual Activity   Alcohol use: No    Alcohol/week: 0.0 standard drinks of alcohol    Comment: Quit in 1987   Drug use: Yes    Types: Marijuana   Sexual activity: Not Currently    Partners: Female  Other Topics Concern   Not on file  Social History Narrative   Patient lives at home alone.    Disabled.   Education GED    Both handed.   Caffeine two cups of coffee daily.      Social Drivers of Corporate investment banker Strain: Low Risk  (03/31/2023)   Overall Financial Resource Strain (CARDIA)    Difficulty of Paying Living Expenses: Not hard at all  Food Insecurity: No Food Insecurity (05/15/2023)   Hunger Vital Sign    Worried About Running Out of Food in the Last Year: Never true    Ran Out of Food in the Last Year: Never true  Transportation  Needs: No Transportation Needs (05/15/2023)   PRAPARE - Administrator, Civil Service (Medical): No    Lack of Transportation (Non-Medical): No  Physical Activity: Inactive (03/31/2023)   Exercise Vital Sign    Days of Exercise per Week: 0 days    Minutes of Exercise per Session: 0 min  Stress: No Stress Concern Present (03/31/2023)   [employer] of Occupational Health - Occupational Stress Questionnaire    Feeling of Stress : Not at all  Social Connections: Socially Isolated (05/15/2023)   Social Connection and Isolation Panel [NHANES]    Frequency of Communication with Friends and Family: More than three times a week    Frequency of Social Gatherings with Friends and Family: Once a week    Attends Religious Services: Never    Automotive engineer or Organizations: No    Attends Engineer, structural: Never    Marital Status: Divorced   Past Medical History:  Past Medical History:  Diagnosis Date   Alcohol abuse    Allergy    Bipolar disorder (HCC)    Chronic pain    lower back pain, "that has progressed"   Depression    Diverticulosis    Dyspnea    Hepatitis C    hx. of"states he no longer has"" is clear".   History of recreational drug use    "cocaine, marijuana, polysubstance" quit 1987.   HLD (hyperlipidemia)    Hypertension    Pancreatitis 2015   Panic attack    Sleep apnea    no cpap used,still has machine(use rarely)    Past Surgical History:  Procedure Laterality Date   CARDIAC CATHETERIZATION  2003   No CAD   EUS N/A 06/23/2014   Procedure: UPPER ENDOSCOPIC ULTRASOUND (EUS) LINEAR;  Surgeon: Janel Medford, MD;  Location: [facility];  Service: Endoscopy;  Laterality: N/A;   sinus surgery     TONSILLECTOMY      Current Medications: Current Facility-Administered Medications  Medication Dose Route Frequency Provider Last Rate Last Admin   acetaminophen  (TYLENOL ) tablet 650 mg  650 mg Oral Q6H PRN Rella Cardinal, FNP   650 mg at 05/19/23 0940   alum & mag hydroxide-simeth (MAALOX/MYLANTA) 200-200-20 MG/5ML suspension 30 mL  30 mL Oral Q4H PRN Starkes-Perry, Takia S, FNP       hydrOXYzine  (ATARAX ) tablet 25 mg  25 mg Oral Q6H PRN Parmar, Meenakshi, MD   25 mg at 05/18/23 2208   magnesium  hydroxide (MILK OF MAGNESIA) suspension 30 mL  30 mL Oral Daily PRN Starkes-Perry, Takia S, FNP       naproxen  (NAPROSYN ) tablet 250 mg  250 mg Oral BID WC Parmar, Meenakshi, MD   250 mg at 05/18/23 1807   OLANZapine  (ZYPREXA ) injection 5 mg  5 mg Intramuscular TID PRN Starkes-Perry, Takia S, FNP       OLANZapine  zydis (ZYPREXA ) disintegrating tablet 5 mg  5 mg Oral TID PRN Starkes-Perry, Takia S, FNP   5 mg at 05/18/23 1191   propranolol  (INDERAL ) tablet 20 mg  20 mg Oral BID Parmar,  Meenakshi, MD   20 mg at 05/19/23 4782   traZODone  (DESYREL ) tablet 100 mg  100 mg Oral QHS PRN Rella Cardinal, FNP        Lab Results: No results found for this or any previous visit (from the past 48 hours).  Blood Alcohol level:  Lab Results  Component Value Date   ETH <10 05/13/2023  ETH <10 04/22/2023    Metabolic Disorder Labs: Lab Results  Component Value Date   HGBA1C 5.5 03/24/2020   MPG 111.15 03/24/2020   MPG 111 09/15/2012   No results found for: "PROLACTIN" Lab Results  Component Value Date   CHOL 170 03/24/2020   TRIG 44 03/24/2020   HDL 62 03/24/2020   CHOLHDL 2.7 03/24/2020   VLDL 9 03/24/2020   LDLCALC 99 03/24/2020   LDLCALC 74 08/16/2019    Physical Findings: AIMS:  , ,  ,  ,    CIWA:    COWS:      Psychiatric Specialty Exam:  Presentation  General Appearance:  Appropriate for Environment; Casual  Eye Contact: Fair  Speech: Pressured  Speech Volume: Increased    Mood and Affect  Mood: Anxious  Affect: Depressed   Thought Process  Thought Processes: Linear  Descriptions of Associations:Intact  Orientation:Full (Time, Place and Person)  Thought Content:Illogical  Hallucinations:Hallucinations: None  Ideas of Reference:None  Suicidal Thoughts:Suicidal Thoughts: No  Homicidal Thoughts:Homicidal Thoughts: No   Sensorium  Memory: Immediate Fair; Recent Fair; Remote Fair  Judgment: Impaired  Insight: Shallow   Executive Functions  Concentration: Poor  Attention Span: Poor  Recall: [employer] of Knowledge: Fair  Language: Fair   Psychomotor Activity  Psychomotor Activity: Psychomotor Activity: Normal  Musculoskeletal: Strength & Muscle Tone: within normal limits Gait & Station: normal Assets  Assets: Manufacturing systems engineer; Financial Resources/Insurance; Physical Health    Physical Exam: Physical Exam Vitals and nursing note reviewed.  HENT:     Head: Normocephalic.      Nose: Nose normal.     Mouth/Throat:     Mouth: Mucous membranes are moist.  Eyes:     Pupils: Pupils are equal, round, and reactive to light.  Cardiovascular:     Rate and Rhythm: Normal rate.  Pulmonary:     Breath sounds: Normal breath sounds.  Abdominal:     General: Bowel sounds are normal.  Skin:    General: Skin is warm.  Neurological:     General: No focal deficit present.     Mental Status: He is alert.    Review of Systems  Constitutional: Negative.   HENT: Negative.    Eyes: Negative.   Respiratory:  Positive for cough.   Cardiovascular: Negative.   Gastrointestinal: Negative.   Skin: Negative.   Neurological: Negative.    Blood pressure (!) 134/90, pulse 69, temperature 97.7 F (36.5 C), resp. rate 18, height 5\' 6"  (1.676 m), weight 68 kg, SpO2 100%. Body mass index is 24.21 kg/m.  Diagnosis: Principal Problem:   MDD (major depressive disorder), recurrent episode, severe (HCC)   PLAN: Safety and Monitoring:  -- Involuntary admission to inpatient psychiatric unit for safety, stabilization and treatment  -- Daily contact with patient to assess and evaluate symptoms and progress in treatment  -- Patient's case to be discussed in multi-disciplinary team meeting  -- Observation Level : q15 minute checks  -- Vital signs:  q12 hours  -- Precautions: suicide, elopement, and assault -- Encouraged patient to participate in unit milieu and in scheduled group therapies  2. Psychiatric Diagnoses and Treatment:    Patient declined psychotropic medications at this time but is on propranolol  20 mg twice daily which she is agreeing to take     3. Medical Issues Being Addressed:  Chronic pain   4. Discharge Planning:   -- Social work and case management to assist with discharge planning and identification of hospital  follow-up needs prior to discharge  -- Estimated LOS: 3-4 days  Tedd Cottrill, MD 05/19/2023, 3:22 PM

## 2023-05-19 NOTE — Group Note (Signed)
 Date:  05/19/2023 Time:  12:54 AM  Group Topic/Focus:  Wrap-Up Group:   The focus of this group is to help patients review their daily goal of treatment and discuss progress on daily workbooks.    Participation Level:  Minimal  Participation Quality:  Appropriate  Affect:  Appropriate  Cognitive:  Appropriate  Insight: Good  Engagement in Group:  Engaged  Modes of Intervention:  Discussion  Additional Comments:    Rolland Cline 05/19/2023, 12:54 AM

## 2023-05-19 NOTE — Group Note (Signed)
 Recreation Therapy Group Note   Group Topic:Other  Group Date: 05/19/2023 Start Time: 1400 End Time: 1500 Facilitators: Deatrice Factor, LRT, CTRS Location:  Dayroom  Group Description: Bingo. LRT and patients played multiple games of Bingo with music playing in the background. LRT and pts discussed how this could be a leisure interest and the importance of doing things they enjoy post-discharge. Pts won stress balls s Chief Financial Officer.    Goal Area(s) Addressed: Patient will identify leisure interests.  Patient will practice healthy decision making. Patient will engage in recreation activity.  Patient will increase communication.   Affect/Mood: Appropriate   Participation Level: Active and Engaged   Participation Quality: Independent   Behavior: Appropriate, Calm, and Cooperative   Speech/Thought Process: Coherent   Insight: Good   Judgement: Good   Modes of Intervention: Activity, Competitive Play, Exploration, Open Conversation, Rapport Building, and Socialization   Patient Response to Interventions:  Attentive, Engaged, Interested , and Receptive   Education Outcome:  Acknowledges education   Clinical Observations/Individualized Feedback: Jonelle Neri was active in their participation of session activities and group discussion. Pt won a Facilities manager, however, declined a stress ball and said he has some at home. Pt interacted well with LRT and peers duration of session. Pt was noted to not be clearing his throat or coughing any like he has in past interactions.    Plan: Continue to engage patient in RT group sessions 2-3x/week.   Deatrice Factor, LRT, CTRS 05/19/2023 5:39 PM

## 2023-05-19 NOTE — Progress Notes (Signed)
   05/19/23 0600  15 Minute Checks  Location Bedroom  Visual Appearance Calm  Behavior Sleeping  Sleep (Behavioral Health Patients Only)  Calculate sleep? (Click Yes once per 24 hr at 0600 safety check) Yes  Documented sleep last 24 hours 8.5

## 2023-05-19 NOTE — Group Note (Signed)
 Date:  05/19/2023 Time:  8:56 PM  Group Topic/Focus:  Making Healthy Choices:   The focus of this group is to help patients identify negative/unhealthy choices they were using prior to admission and identify positive/healthier coping strategies to replace them upon discharge.    Participation Level:  Active  Participation Quality:  Appropriate  Affect:  Appropriate  Cognitive:  Appropriate  Insight: Appropriate  Engagement in Group:  Engaged  Modes of Intervention:  Discussion  Additional Comments:    Sherlie Distance 05/19/2023, 8:56 PM

## 2023-05-19 NOTE — Plan of Care (Signed)
°  Problem: Group Participation °Goal: STG - Patient will engage in groups without prompting or encouragement from LRT x3 group sessions within 5 recreation therapy group sessions °Description: STG - Patient will engage in groups without prompting or encouragement from LRT x3 group sessions within 5 recreation therapy group sessions °Outcome: Progressing °  °

## 2023-05-20 DIAGNOSIS — F332 Major depressive disorder, recurrent severe without psychotic features: Secondary | ICD-10-CM | POA: Diagnosis not present

## 2023-05-20 NOTE — Progress Notes (Signed)
[facility] MD Progress Note  05/20/2023 4:22 PM [name]  MRN:  [id number]  [name], a 68 year old male, presented to the [facility]) as a voluntary walk-in, unaccompanied. The patient reports ongoing chronic pain and passive suicidal ideation,  Subjective:  Chart reviewed, case discussed in multidisciplinary meeting, patient seen during rounds.  Today on interview patient is noted to be resting in his bed.  He did not display any involuntary movements during his sleep.  He reports that he is feeling fine.  He continues to be focused on his medical issues and is requesting for hospice consult.  He reports that he has been to multiple medical providers and nobody is helping him.  He consistently denies feeling depressed.  He consistently denies SI/HI/plan.  He denies auditory/visual hallucinations.  He is refusing to try any psychotropic medications including hydroxyzine as needed for anxiety.  Sleep: Fair  Appetite:  Fair  Past Psychiatric History: see h&P Family History:  Family History  Problem Relation Age of Onset   Heart disease Mother    Kidney disease Mother    COPD Mother    Lupus Mother    Heart disease Father    Breast cancer Maternal Grandmother    Diabetes Maternal Grandfather    Social History:  Social History   Substance and Sexual Activity  Alcohol Use No   Alcohol/week: 0.0 standard drinks of alcohol   Comment: Quit in 1987     Social History   Substance and Sexual Activity  Drug Use Yes   Types: Marijuana    Social History   Socioeconomic History   Marital status: Divorced    Spouse name: Not on file   Number of children: 0   Years of education: GED   Highest education level: Not on file  Occupational History   Occupation: DISABLED   Occupation:    Tobacco Use   Smoking status: Former    Current packs/day: 0.00    Average packs/day: 2.0 packs/day for 16.0 years (32.0 ttl pk-yrs)    Types: Cigarettes    Start date:  04/08/1986    Quit date: 04/08/2002    Years since quitting: 21.1    Passive exposure: Past   Smokeless tobacco: Never  Vaping Use   Vaping status: Never Used  Substance and Sexual Activity   Alcohol use: No    Alcohol/week: 0.0 standard drinks of alcohol    Comment: Quit in 1987   Drug use: Yes    Types: Marijuana   Sexual activity: Not Currently    Partners: Female  Other Topics Concern   Not on file  Social History Narrative   Patient lives at home alone.    Disabled.   Education GED    Both handed.   Caffeine two cups of coffee daily.      Social Drivers of Corporate investment banker Strain: Low Risk  (03/31/2023)   Overall Financial Resource Strain (CARDIA)    Difficulty of Paying Living Expenses: Not hard at all  Food Insecurity: No Food Insecurity (05/15/2023)   Hunger Vital Sign    Worried About Running Out of Food in the Last Year: Never true    Ran Out of Food in the Last Year: Never true  Transportation Needs: No Transportation Needs (05/15/2023)   PRAPARE - Administrator, Civil Service (Medical): No    Lack of Transportation (Non-Medical): No  Physical Activity: Inactive (03/31/2023)   Exercise Vital Sign  Days of Exercise per Week: 0 days    Minutes of Exercise per Session: 0 min  Stress: No Stress Concern Present (03/31/2023)   [employer] of Occupational Health - Occupational Stress Questionnaire    Feeling of Stress : Not at all  Social Connections: Socially Isolated (05/15/2023)   Social Connection and Isolation Panel [NHANES]    Frequency of Communication with Friends and Family: More than three times a week    Frequency of Social Gatherings with Friends and Family: Once a week    Attends Religious Services: Never    Database administrator or Organizations: No    Attends Engineer, structural: Never    Marital Status: Divorced   Past Medical History:  Past Medical History:  Diagnosis Date   Alcohol abuse    Allergy     Bipolar disorder (HCC)    Chronic pain    lower back pain, "that has progressed"   Depression    Diverticulosis    Dyspnea    Hepatitis C    hx. of"states he no longer has"" is clear".   History of recreational drug use    "cocaine, marijuana, polysubstance" quit 1987.   HLD (hyperlipidemia)    Hypertension    Pancreatitis 2015   Panic attack    Sleep apnea    no cpap used,still has machine(use rarely)    Past Surgical History:  Procedure Laterality Date   CARDIAC CATHETERIZATION  2003   No CAD   EUS N/A 06/23/2014   Procedure: UPPER ENDOSCOPIC ULTRASOUND (EUS) LINEAR;  Surgeon: Rachael Fee, MD;  Location: [facility];  Service: Endoscopy;  Laterality: N/A;   sinus surgery     TONSILLECTOMY      Current Medications: Current Facility-Administered Medications  Medication Dose Route Frequency Provider Last Rate Last Admin   acetaminophen (TYLENOL) tablet 650 mg  650 mg Oral Q6H PRN Maryagnes Amos, FNP   650 mg at 05/20/23 1046   alum & mag hydroxide-simeth (MAALOX/MYLANTA) 200-200-20 MG/5ML suspension 30 mL  30 mL Oral Q4H PRN Starkes-Perry, Juel Burrow, FNP       hydrOXYzine (ATARAX) tablet 25 mg  25 mg Oral Q6H PRN Lewanda Rife, MD   25 mg at 05/18/23 2208   magnesium hydroxide (MILK OF MAGNESIA) suspension 30 mL  30 mL Oral Daily PRN Maryagnes Amos, FNP       naproxen (NAPROSYN) tablet 250 mg  250 mg Oral BID WC Lewanda Rife, MD   250 mg at 05/18/23 1807   OLANZapine (ZYPREXA) injection 5 mg  5 mg Intramuscular TID PRN Maryagnes Amos, FNP       OLANZapine zydis (ZYPREXA) disintegrating tablet 5 mg  5 mg Oral TID PRN Maryagnes Amos, FNP   5 mg at 05/18/23 7846   propranolol (INDERAL) tablet 20 mg  20 mg Oral BID Lewanda Rife, MD   20 mg at 05/20/23 1046   traZODone (DESYREL) tablet 100 mg  100 mg Oral QHS PRN Maryagnes Amos, FNP        Lab Results: No results found for this or any previous visit (from the past 48  hours).  Blood Alcohol level:  Lab Results  Component Value Date   [facility] <10 05/13/2023   ETH <10 04/22/2023    Metabolic Disorder Labs: Lab Results  Component Value Date   HGBA1C 5.5 03/24/2020   MPG 111.15 03/24/2020   MPG 111 09/15/2012   No results found for: "PROLACTIN" Lab Results  Component Value Date   CHOL 170 03/24/2020   TRIG 44 03/24/2020   HDL 62 03/24/2020   CHOLHDL 2.7 03/24/2020   VLDL 9 03/24/2020   LDLCALC 99 03/24/2020   LDLCALC 74 08/16/2019    Physical Findings: AIMS:  , ,  ,  ,    CIWA:    COWS:      Psychiatric Specialty Exam:  Presentation  General Appearance:  Appropriate for Environment; Casual  Eye Contact: Fair  Speech: Clear and Coherent  Speech Volume: Increased    Mood and Affect  Mood: Anxious  Affect: Appropriate   Thought Process  Thought Processes: Goal Directed  Descriptions of Associations:Intact  Orientation:Full (Time, Place and Person)  Thought Content:Illogical  Hallucinations:Hallucinations: None  Ideas of Reference:None  Suicidal Thoughts:Suicidal Thoughts: No  Homicidal Thoughts:Homicidal Thoughts: No   Sensorium  Memory: Immediate Fair; Recent Fair; Remote Fair  Judgment: Impaired  Insight: Shallow   Executive Functions  Concentration: Poor  Attention Span: Fair  Recall: [employer] of Knowledge: Fair  Language: Fair   Psychomotor Activity  Psychomotor Activity: Psychomotor Activity: Normal  Musculoskeletal: Strength & Muscle Tone: within normal limits Gait & Station: normal Assets  Assets: Manufacturing systems engineer; Desire for Improvement; Physical Health    Physical Exam: Physical Exam Vitals and nursing note reviewed.  HENT:     Head: Normocephalic.     Nose: Nose normal.     Mouth/Throat:     Mouth: Mucous membranes are moist.  Eyes:     Pupils: Pupils are equal, round, and reactive to light.  Cardiovascular:     Rate and Rhythm: Normal rate.   Pulmonary:     Breath sounds: Normal breath sounds.  Abdominal:     General: Bowel sounds are normal.  Skin:    General: Skin is warm.  Neurological:     General: No focal deficit present.     Mental Status: He is alert.    Review of Systems  Constitutional: Negative.   HENT: Negative.    Eyes: Negative.   Respiratory:  Positive for cough.   Cardiovascular: Negative.   Gastrointestinal: Negative.   Skin: Negative.   Neurological: Negative.    Blood pressure (!) 144/104, pulse 68, temperature (!) 97.3 F (36.3 C), resp. rate 18, height 5\' 6"  (1.676 m), weight 68 kg, SpO2 99%. Body mass index is 24.21 kg/m.  Diagnosis: Principal Problem:   MDD (major depressive disorder), recurrent episode, severe (HCC)   PLAN: Safety and Monitoring:  -- Involuntary admission to inpatient psychiatric unit for safety, stabilization and treatment  -- Daily contact with patient to assess and evaluate symptoms and progress in treatment  -- Patient's case to be discussed in multi-disciplinary team meeting  -- Observation Level : q15 minute checks  -- Vital signs:  q12 hours  -- Precautions: suicide, elopement, and assault -- Encouraged patient to participate in unit milieu and in scheduled group therapies  2. Psychiatric Diagnoses and Treatment:    Patient declined psychotropic medications at this time but is on propranolol 20 mg twice daily which she is agreeing to take     3. Medical Issues Being Addressed:  Chronic pain   4. Discharge Planning:   -- Social work and case management to assist with discharge planning and identification of hospital follow-up needs prior to discharge  -- Estimated LOS: 3-4 days  Verner Chol, MD 05/20/2023, 4:22 PM

## 2023-05-20 NOTE — Plan of Care (Signed)
Problem: Education: Goal: Ability to make informed decisions regarding treatment will improve Outcome: Progressing   Problem: Coping: Goal: Coping ability will improve Outcome: Progressing

## 2023-05-20 NOTE — Progress Notes (Signed)
   05/20/23 1300  Psych Admission Type (Psych Patients Only)  Admission Status Involuntary  Psychosocial Assessment  Patient Complaints Anxiety  Eye Contact Brief  Facial Expression Anxious  Affect Anxious  Speech Logical/coherent  Interaction Assertive  Motor Activity Slow  Appearance/Hygiene Unremarkable  Behavior Characteristics Cooperative  Mood Anxious  Thought Process  Coherency Tangential  Content WDL  Delusions None reported or observed  Perception WDL  Hallucination None reported or observed  Judgment Limited  Confusion None  Danger to Self  Current suicidal ideation? Denies  Agreement Not to Harm Self Yes  Description of Agreement verbal  Danger to Others  Danger to Others None reported or observed

## 2023-05-20 NOTE — Group Note (Signed)
Date:  05/20/2023 Time:  8:57 PM  Group Topic/Focus:  Overcoming Stress:   The focus of this group is to define stress and help patients assess their triggers.    Participation Level:  Did Not Attend  Participation Quality:   Did Not Attend  Affect:   Did Not Attend  Cognitive:   Did Not Attend  Insight: None  Engagement in Group:  None  Modes of Intervention:  Discussion  Additional Comments:    Garry Heater 05/20/2023, 8:57 PM

## 2023-05-20 NOTE — Progress Notes (Signed)
   05/20/23 0600  15 Minute Checks  Location Bedroom  Visual Appearance Calm  Behavior Composed  Sleep (Behavioral Health Patients Only)  Calculate sleep? (Click Yes once per 24 hr at 0600 safety check) Yes  Documented sleep last 24 hours 6

## 2023-05-20 NOTE — Progress Notes (Signed)
   05/20/23 1400  Spiritual Encounters  Type of Visit Follow up  Care provided to: Patient  Conversation partners present during encounter Social worker/Care management/TOC  Referral source Chaplain assessment  Reason for visit Routine spiritual support  OnCall Visit No  Spiritual Framework  Presenting Themes Values and beliefs;Courage hope and growth;Rituals and practive;Impactful experiences and emotions  Patient Stress Factors Health changes  Interventions  Spiritual Care Interventions Made Compassionate presence;Encouragement;Self-care teaching   Chaplain followed up with patient to check on his progress. Patient shared that he is still in a lot of pain. Patient stated that he does not want to be on medication for the pain. He stated he would just like for the doctors to examine him to see what is going on with his body. Chaplain spoke with the social worker about the patients concerns. Chaplain services remain available for spiritual and emotional support.

## 2023-05-20 NOTE — Progress Notes (Signed)
   05/20/23 0432  Psych Admission Type (Psych Patients Only)  Admission Status Involuntary  Psychosocial Assessment  Patient Complaints Anxiety  Eye Contact Brief  Facial Expression Animated;Anxious  Affect Anxious  Speech Logical/coherent  Interaction Assertive  Motor Activity Unsteady  Appearance/Hygiene Unremarkable  Behavior Characteristics Cooperative  Mood Pleasant  Thought Process  Coherency WDL  Content WDL  Delusions None reported or observed  Perception WDL  Hallucination None reported or observed  Judgment Impaired  Confusion None  Danger to Self  Current suicidal ideation? Denies

## 2023-05-20 NOTE — Group Note (Signed)
Date:  05/20/2023 Time:  10:21 AM  Group Topic/Focus:  Coping With Mental Health Crisis:   The purpose of this group is to help patients identify strategies for coping with mental health crisis.  Group discusses possible causes of crisis and ways to manage them effectively.    Participation Level:  Did Not Attend   Ardelle Anton 05/20/2023, 10:21 AM

## 2023-05-20 NOTE — Plan of Care (Signed)
  Problem: Education: Goal: Ability to make informed decisions regarding treatment will improve Outcome: Progressing   Problem: Coping: Goal: Coping ability will improve Outcome: Progressing   Problem: Health Behavior/Discharge Planning: Goal: Identification of resources available to assist in meeting health care needs will improve Outcome: Progressing   Problem: Medication: Goal: Compliance with prescribed medication regimen will improve Outcome: Progressing   Problem: Self-Concept: Goal: Ability to disclose and discuss suicidal ideas will improve Outcome: Progressing Goal: Will verbalize positive feelings about self Outcome: Progressing Note: Patient is on track.

## 2023-05-20 NOTE — Group Note (Signed)
Recreation Therapy Group Note   Group Topic:Coping Skills  Group Date: 05/20/2023 Start Time: 1400 End Time: 1450 Facilitators: Rosina Lowenstein, LRT, CTRS Location:  Dayroom  Group Description: Coping A-Z. LRT and patients engage in a guided discussion on what coping skills are and gave specific examples. LRT passed out a handout labeled Coping A-Z with blank spaces beside each letter. LRT prompted patients to come up with a coping skill for each of the letters. LRT and patients went over the handout and gave ideas for each letter if anyone had any blanks left on their paper. Patients kept this handout with them that listed 26 different coping skills.   Goal Area(s) Addressed: Patients will be able to define "coping skills". Patient will identify new coping skills.  Patient will increase communication.   Affect/Mood: Appropriate   Participation Level: Active and Engaged   Participation Quality: Independent   Behavior: Calm and Cooperative   Speech/Thought Process: Coherent   Insight: Good   Judgement: Good   Modes of Intervention: Clarification, Education, Exploration, Guided Discussion, Open Conversation, and Worksheet   Patient Response to Interventions:  Attentive, Engaged, Interested , and Receptive   Education Outcome:  Acknowledges education   Clinical Observations/Individualized Feedback: [name] was active in their participation of session activities and group discussion. Pt identified "go to [employer], exercise, talk to someone, and deep breathing" as coping skills. Pt spontaneously contributed to group discussion while interacting well with LRT and peers duration of session.    Plan: Continue to engage patient in RT group sessions 2-3x/week.   Rosina Lowenstein, LRT, CTRS 05/20/2023 4:43 PM

## 2023-05-21 DIAGNOSIS — F332 Major depressive disorder, recurrent severe without psychotic features: Secondary | ICD-10-CM | POA: Diagnosis not present

## 2023-05-21 MED ORDER — NAPROXEN 250 MG PO TABS: 250.0000 mg | ORAL_TABLET | Freq: Two times a day (BID) | ORAL | 0 refills | Status: DC

## 2023-05-21 MED ORDER — PROPRANOLOL HCL 20 MG PO TABS: 20.0000 mg | ORAL_TABLET | Freq: Two times a day (BID) | ORAL | 0 refills | Status: DC

## 2023-05-21 NOTE — Care Management Important Message (Signed)
Important Message  Patient Details  Name: [name] MRN: [id number] Date of Birth: 30-Nov-1955   Important Message Given:  Yes - Medicare IM     Elza Rafter, LCSWA 05/21/2023, 10:27 AM

## 2023-05-21 NOTE — Plan of Care (Signed)
  Problem: Coping: Goal: Coping ability will improve Outcome: Progressing   Problem: Health Behavior/Discharge Planning: Goal: Identification of resources available to assist in meeting health care needs will improve Outcome: Progressing   Problem: Self-Concept: Goal: Will verbalize positive feelings about self Outcome: Progressing Note: Patient is on track. Patient will work on increased adherence

## 2023-05-21 NOTE — Progress Notes (Signed)
  [facility] Adult Case Management Discharge Plan :  Will you be returning to the same living situation after discharge:  Yes,  pt will return home  At discharge, do you have transportation home?: Yes,  CSW will assist with transportation to [facility] so that pt can pick up his car  Do you have the ability to pay for your medications: Yes,   [employer] MEDICARE / Suan Halter DUAL COMPLETE  Release of information consent forms completed and in the chart;  Patient's signature needed at discharge.  Patient to Follow up at:  Follow-up Information     [facility] Follow up on 05/28/2023.   Why: appt on 2/19 at 9:30 via the phone and will receive a call at [phone] Contact information: [address]  [address] [place] [address] [phone]                 Next level of care provider has access to [facility] Link:no  Safety Planning and Suicide Prevention discussed: Yes,  although pt declined, CSW went over SPE with pt at discharge      Has patient been referred to the Quitline?: Patient does not use tobacco/nicotine products  Patient has been referred for addiction treatment: No known substance use disorder.  [address], LCSWA 05/21/2023, 10:48 AM

## 2023-05-21 NOTE — Progress Notes (Addendum)
   05/21/23 0128  Psych Admission Type (Psych Patients Only)  Admission Status Involuntary  Psychosocial Assessment  Patient Complaints Anxiety;Other (Comment) (pain not controlled)  Eye Contact Fair  Facial Expression Anxious  Affect Anxious  Speech Logical/coherent  Interaction Assertive  Motor Activity Slow  Appearance/Hygiene Unremarkable  Behavior Characteristics Cooperative;Anxious  Mood Anxious  Thought Process  Coherency WDL  Content Blaming others;Blaming self  Delusions None reported or observed  Perception WDL  Hallucination None reported or observed  Judgment Limited  Confusion None  Danger to Self  Current suicidal ideation? Denies  Agreement Not to Harm Self Yes  Description of Agreement verbal  Danger to Others  Danger to Others None reported or observed   Progress note   D: Pt seen in his room. Pt denies SI, HI, AVH. Pt rates pain  10/10 as generalized neuropathy that he describes as burning sensation, pins and needles and some numbness. Pt states anxiety and depression are directly related to the issues he has with pain. "I can't get the doctors to listen to me. It is not in my head. I hurt everywhere all the time." Pt will take Tylenol and Inderal but not many other medications because they have a negative effect on his pain. Pt has asked for a hospice consult. "I don't know what else to do. I can't get anyone to help control this pain."  No other concerns noted at this time.  A: Pt provided support and encouragement. Pt given scheduled medication as prescribed. PRNs as appropriate. Q15 min checks for safety.   R: Pt safe on the unit. Will continue to monitor.

## 2023-05-21 NOTE — Progress Notes (Signed)
   05/21/23 0600  15 Minute Checks  Location Bedroom  Visual Appearance Calm  Behavior Sleeping  Sleep (Behavioral Health Patients Only)  Calculate sleep? (Click Yes once per 24 hr at 0600 safety check) Yes  Documented sleep last 24 hours 5.5

## 2023-05-21 NOTE — Progress Notes (Signed)

## 2023-05-21 NOTE — Progress Notes (Signed)
   05/21/23 0742  Psych Admission Type (Psych Patients Only)  Admission Status Involuntary  Psychosocial Assessment  Patient Complaints Anxiety  Eye Contact Fair  Facial Expression Worried  Affect Anxious  Speech Logical/coherent  Interaction Needy  Motor Activity Unsteady  Appearance/Hygiene Unremarkable  Behavior Characteristics Cooperative  Mood Anxious  Thought Process  Coherency WDL  Content WDL  Delusions None reported or observed  Perception WDL  Hallucination None reported or observed  Judgment Impaired  Confusion None  Danger to Self  Current suicidal ideation? Denies  Danger to Others  Danger to Others None reported or observed

## 2023-05-21 NOTE — BHH Suicide Risk Assessment (Signed)
[facility] Discharge Suicide Risk Assessment   Principal Problem: MDD (major depressive disorder), recurrent episode, severe (HCC) Discharge Diagnoses: Principal Problem:   MDD (major depressive disorder), recurrent episode, severe (HCC)   Total Time spent with patient: 30 minutes  Musculoskeletal: Strength & Muscle Tone: within normal limits Gait & Station: normal Patient leans: N/A  Psychiatric Specialty Exam  Presentation  General Appearance:  Appropriate for Environment; Casual  Eye Contact: Fair  Speech: Clear and Coherent  Speech Volume: Increased  Handedness: Right   Mood and Affect  Mood: Anxious  Duration of Depression Symptoms: No data recorded Affect: Appropriate   Thought Process  Thought Processes: Goal Directed  Descriptions of Associations:Intact  Orientation:Full (Time, Place and Person)  Thought Content:Illogical  History of Schizophrenia/Schizoaffective disorder:No  Duration of Psychotic Symptoms:No data recorded Hallucinations:Hallucinations: None  Ideas of Reference:None  Suicidal Thoughts:Suicidal Thoughts: No  Homicidal Thoughts:Homicidal Thoughts: No   Sensorium  Memory: Immediate Fair; Recent Fair; Remote Fair  Judgment: Impaired  Insight: Shallow   Executive Functions  Concentration: Poor  Attention Span: Fair  Recall: [employer] of Knowledge: Fair  Language: Fair   Psychomotor Activity  Psychomotor Activity: Psychomotor Activity: Normal   Assets  Assets: Communication Skills; Desire for Improvement; Physical Health   Sleep  Sleep: Sleep: Fair   Physical Exam: Physical Exam ROS Blood pressure (!) 133/96, pulse 72, temperature 98.4 F (36.9 C), resp. rate 18, height 5\' 6"  (1.676 m), weight 68 kg, SpO2 97%. Body mass index is 24.21 kg/m.  Mental Status Per Nursing Assessment::   On Admission:  Suicidal ideation indicated by patient  Demographic Factors:  Male, Age 66 or older, and  Caucasian  Loss Factors: Financial problems/change in socioeconomic status  Historical Factors: NA  Risk Reduction Factors:   Positive coping skills or problem solving skills  Continued Clinical Symptoms:  Previous Psychiatric Diagnoses and Treatments Medical Diagnoses and Treatments/Surgeries  Cognitive Features That Contribute To Risk:  Thought constriction (tunnel vision)    Suicide Risk:  Minimal: No identifiable suicidal ideation.  Patients presenting with no risk factors but with morbid ruminations; may be classified as minimal risk based on the severity of the depressive symptoms    Plan Of Care/Follow-up recommendations:  Activity:  AS tolearted  Verner Chol, MD 05/21/2023, 10:26 AM

## 2023-05-21 NOTE — Plan of Care (Signed)
  Problem: Education: Goal: Ability to make informed decisions regarding treatment will improve Outcome: Adequate for Discharge   Problem: Coping: Goal: Coping ability will improve Outcome: Adequate for Discharge   Problem: Health Behavior/Discharge Planning: Goal: Identification of resources available to assist in meeting health care needs will improve Outcome: Adequate for Discharge   Problem: Medication: Goal: Compliance with prescribed medication regimen will improve Outcome: Adequate for Discharge   Problem: Self-Concept: Goal: Ability to disclose and discuss suicidal ideas will improve Outcome: Adequate for Discharge Goal: Will verbalize positive feelings about self Outcome: Adequate for Discharge   Problem: Group Participation Goal: STG - Patient will engage in groups without prompting or encouragement from LRT x3 group sessions within 5 recreation therapy group sessions Description: STG - Patient will engage in groups without prompting or encouragement from LRT x3 group sessions within 5 recreation therapy group sessions Outcome: Adequate for Discharge

## 2023-05-21 NOTE — Discharge Summary (Signed)
Physician Discharge Summary Note  Patient:  [name] is an 68 y.o., male MRN:  [id number] DOB:  [birth year] Patient phone:  [phone] (home)  Patient address:   [address] [place] [address],    Date of Admission:  05/15/2023 Date of Discharge: 05/21/23  Reason for Admission:  [name], a 68 year old male, presented to the [facility]) as a voluntary walk-in, unaccompanied. The patient reports ongoing chronic pain and passive suicidal ideation,   Principal Problem: MDD (major depressive disorder), recurrent episode, severe (HCC) Discharge Diagnoses: Principal Problem:   MDD (major depressive disorder), recurrent episode, severe (HCC)   Past Psychiatric History: See H&P  Family Psychiatric  History: See H&P Social History:  Social History   Substance and Sexual Activity  Alcohol Use No   Alcohol/week: 0.0 standard drinks of alcohol   Comment: Quit in 1987     Social History   Substance and Sexual Activity  Drug Use Yes   Types: Marijuana    Social History   Socioeconomic History   Marital status: Divorced    Spouse name: Not on file   Number of children: 0   Years of education: GED   Highest education level: Not on file  Occupational History   Occupation: DISABLED   Occupation:    Tobacco Use   Smoking status: Former    Current packs/day: 0.00    Average packs/day: 2.0 packs/day for 16.0 years (32.0 ttl pk-yrs)    Types: Cigarettes    Start date: 04/08/1986    Quit date: 04/08/2002    Years since quitting: 21.1    Passive exposure: Past   Smokeless tobacco: Never  Vaping Use   Vaping status: Never Used  Substance and Sexual Activity   Alcohol use: No    Alcohol/week: 0.0 standard drinks of alcohol    Comment: Quit in 1987   Drug use: Yes    Types: Marijuana   Sexual activity: Not Currently    Partners: Female  Other Topics Concern   Not on file  Social History Narrative   Patient lives at home alone.     Disabled.   Education GED    Both handed.   Caffeine two cups of coffee daily.      Social Drivers of Corporate investment banker Strain: Low Risk  (03/31/2023)   Overall Financial Resource Strain (CARDIA)    Difficulty of Paying Living Expenses: Not hard at all  Food Insecurity: No Food Insecurity (05/15/2023)   Hunger Vital Sign    Worried About Running Out of Food in the Last Year: Never true    Ran Out of Food in the Last Year: Never true  Transportation Needs: No Transportation Needs (05/15/2023)   PRAPARE - Administrator, Civil Service (Medical): No    Lack of Transportation (Non-Medical): No  Physical Activity: Inactive (03/31/2023)   Exercise Vital Sign    Days of Exercise per Week: 0 days    Minutes of Exercise per Session: 0 min  Stress: No Stress Concern Present (03/31/2023)   [employer] of Occupational Health - Occupational Stress Questionnaire    Feeling of Stress : Not at all  Social Connections: Socially Isolated (05/15/2023)   Social Connection and Isolation Panel [NHANES]    Frequency of Communication with Friends and Family: More than three times a week    Frequency of Social Gatherings with Friends and Family: Once a week    Attends Religious Services: Never  Active Member of Clubs or Organizations: No    Attends Banker Meetings: Never    Marital Status: Divorced   Past Medical History:  Past Medical History:  Diagnosis Date   Alcohol abuse    Allergy    Bipolar disorder (HCC)    Chronic pain    lower back pain, "that has progressed"   Depression    Diverticulosis    Dyspnea    Hepatitis C    hx. of"states he no longer has"" is clear".   History of recreational drug use    "cocaine, marijuana, polysubstance" quit 1987.   HLD (hyperlipidemia)    Hypertension    Pancreatitis 2015   Panic attack    Sleep apnea    no cpap used,still has machine(use rarely)    Past Surgical History:  Procedure Laterality Date    CARDIAC CATHETERIZATION  2003   No CAD   EUS N/A 06/23/2014   Procedure: UPPER ENDOSCOPIC ULTRASOUND (EUS) LINEAR;  Surgeon: Rachael Fee, MD;  Location: [facility] END[facility];  Service: Endoscopy;  Laterality: N/A;   sinus surgery     TONSILLECTOMY     Family History:  Family History  Problem Relation Age of Onset   Heart disease Mother    Kidney disease Mother    COPD Mother    Lupus Mother    Heart disease Father    Breast cancer Maternal Grandmother    Diabetes Maternal Grandfather     Hospital Course:  [name], a 68 year old male, presented to the [facility]) as a voluntary walk-in, unaccompanied. The patient reports ongoing chronic pain and passive suicidal ideation, on admission patient is noted to be somatically preoccupied with symptoms of coughing while talking and reported pain.  Multidisciplinary team approach was provided.  Medication management, group/milieu therapy was offered.  He claims all his problems are due to taking medications that include psychotropic medications for his mood.  Since his admission he consistently denied SI/HI/intent/plan.  He consistently denied auditory/visual hallucinations.  He declined any psychotropic medication to help with his mood and blames all psychotropic medications causing problems with his speech.  He is requesting for hospice care.  Provider educated patient that he needs to follow-up with his medical doctor and there is no indication for hospice care at this time.  Patient remains adamant throughout the hospital stay and had no goals for treatment in the psychiatric facility.  Patient refused to try any medications during this stay he wants to go to [facility] clinic and see if any experimental research medication is available for his speech issues.  On the day of discharge he consistently denies SI/HI/plan, denies auditory/visual hallucinations, no overt delusions noted or reported.     Psychiatric Specialty  Exam:  Presentation  General Appearance:  Appropriate for Environment; Casual  Eye Contact: Fair  Speech: Clear and Coherent  Speech Volume: Normal    Mood and Affect  Mood: Euthymic  Affect: Appropriate   Thought Process  Thought Processes: Coherent  Descriptions of Associations:Intact  Orientation:Full (Time, Place and Person)  Thought Content:Logical  Hallucinations:Hallucinations: None  Ideas of Reference:None  Suicidal Thoughts:Suicidal Thoughts: No  Homicidal Thoughts:Homicidal Thoughts: No   Sensorium  Memory: Immediate Fair; Recent Fair; Remote Fair  Judgment: Fair  Insight: Fair   Art therapist  Concentration: Fair  Attention Span: Fair  Recall: [employer] of Knowledge: Fair  Language: Fair   Psychomotor Activity  Psychomotor Activity: Psychomotor Activity: Normal  Musculoskeletal: Strength &  Muscle Tone: within normal limits Gait & Station: normal Assets  Assets: Manufacturing systems engineer; Desire for Improvement; Resilience   Sleep  Sleep: Sleep: Fair    Physical Exam: Physical Exam Vitals and nursing note reviewed.  HENT:     Head: Normocephalic.     Nose: Nose normal.     Mouth/Throat:     Mouth: Mucous membranes are moist.  Eyes:     Pupils: Pupils are equal, round, and reactive to light.  Cardiovascular:     Rate and Rhythm: Normal rate.  Pulmonary:     Breath sounds: Normal breath sounds.  Abdominal:     General: Bowel sounds are normal.  Skin:    General: Skin is warm.  Neurological:     General: No focal deficit present.     Mental Status: He is alert.    Review of Systems  Constitutional: Negative.   HENT: Negative.    Eyes: Negative.   Respiratory: Negative.    Cardiovascular: Negative.   Gastrointestinal: Negative.   Musculoskeletal: Negative.   Skin: Negative.    Blood pressure (!) 133/96, pulse 72, temperature 98.4 F (36.9 C), resp. rate 18, height 5\' 6"  (1.676 m),  weight 68 kg, SpO2 97%. Body mass index is 24.21 kg/m.   Social History   Tobacco Use  Smoking Status Former   Current packs/day: 0.00   Average packs/day: 2.0 packs/day for 16.0 years (32.0 ttl pk-yrs)   Types: Cigarettes   Start date: 04/08/1986   Quit date: 04/08/2002   Years since quitting: 21.1   Passive exposure: Past  Smokeless Tobacco Never   Tobacco Cessation:  N/A, patient does not currently use tobacco products   Blood Alcohol level:  Lab Results  Component Value Date   ETH <10 05/13/2023   ETH <10 04/22/2023    Metabolic Disorder Labs:  Lab Results  Component Value Date   HGBA1C 5.5 03/24/2020   MPG 111.15 03/24/2020   MPG 111 09/15/2012   No results found for: "PROLACTIN" Lab Results  Component Value Date   CHOL 170 03/24/2020   TRIG 44 03/24/2020   HDL 62 03/24/2020   CHOLHDL 2.7 03/24/2020   VLDL 9 03/24/2020   LDLCALC 99 03/24/2020   LDLCALC 74 08/16/2019    See Psychiatric Specialty Exam and Suicide Risk Assessment completed by Attending Physician prior to discharge.  Discharge destination:  Home  Is patient on multiple antipsychotic therapies at discharge:  No   Has Patient had three or more failed trials of antipsychotic monotherapy by history:  No  Recommended Plan for Multiple Antipsychotic Therapies: NA  Discharge Instructions     Diet - low sodium heart healthy   Complete by: As directed    Increase activity slowly   Complete by: As directed       Allergies as of 05/21/2023       Reactions   Diazepam Other (See Comments)   Ringing in ears, diarrhea, CNS issues-multiple reactions  Neurotoxicity   Morphine And Codeine Shortness Of Breath   Gabapentin Other (See Comments)   "Gets worse"        Medication List     STOP taking these medications    acetaminophen 325 MG tablet Commonly known as: TYLENOL   DULoxetine 30 MG capsule Commonly known as: CYMBALTA   QUEtiapine 25 MG tablet Commonly known as: SEROQUEL        TAKE these medications      Indication  naproxen 250 MG tablet Commonly known as: NAPROSYN  Take 1 tablet (250 mg total) by mouth 2 (two) times daily with a meal.    propranolol 20 MG tablet Commonly known as: INDERAL Take 1 tablet (20 mg total) by mouth 2 (two) times daily.         Follow-up Information     [facility] Follow up on 05/28/2023.   Why: appt on 2/19 at 9:30 via the phone and will receive a call at [phone] Contact information: [address]  [address] [place] [address] [phone]                 Follow-up recommendations:  Activity:  as tolerated    Signed: Verner Chol, MD 05/21/2023, 4:18 PM

## 2023-05-28 ENCOUNTER — Ambulatory Visit (HOSPITAL_BASED_OUTPATIENT_CLINIC_OR_DEPARTMENT_OTHER): Payer: Self-pay | Admitting: Family Medicine

## 2023-05-28 ENCOUNTER — Telehealth (HOSPITAL_BASED_OUTPATIENT_CLINIC_OR_DEPARTMENT_OTHER): Payer: Self-pay | Admitting: *Deleted

## 2023-05-28 DIAGNOSIS — F1911 Other psychoactive substance abuse, in remission: Secondary | ICD-10-CM

## 2023-05-28 DIAGNOSIS — R4189 Other symptoms and signs involving cognitive functions and awareness: Secondary | ICD-10-CM

## 2023-05-28 NOTE — Telephone Encounter (Signed)
Copied from CRM [id number]. Topic: General - Other >> May 28, 2023  1:15 PM Priscille Loveless wrote: Reason for CRM: Pt wants to get a referral to [facility]. He stated that he is needing help getting diagnosed

## 2023-05-28 NOTE — Telephone Encounter (Signed)
Dr. De Peru, please advise if you are okay with this referral being placed.

## 2023-05-28 NOTE — Telephone Encounter (Signed)
Chief Complaint: Shortness of breath  Symptoms: Shortness of breath, diffuse pain Frequency: Constant  Disposition: [] ED /[] Urgent Care (no appt availability in office) / [x] Appointment(In office/virtual)/ []  [facility]/ [] Home Care/ [] Refused Recommended Disposition /[] [place]/ []  Follow-up with PCP Additional Notes: Patient reports longstanding difficulty breathing which he was seen at the ED for 2 days ago and discharged home after a normal workup. He states that he has been seen multiple times for this and his x-rays are always normal, and he believes this to be a neurological symptom. Patient is requesting a referral to [facility]. Appointment made for tomorrow to discuss his symptoms and referral request.   Patient instructed to call back for new or worsening symptoms. Patient verbalized understanding and agreement with this plan.   Reason for Disposition  [1] MODERATE longstanding difficulty breathing (e.g., speaks in phrases, SOB even at rest, pulse 100-120) AND [2] SAME as normal  Answer Assessment - Initial Assessment Questions 1. RESPIRATORY STATUS: "Describe your breathing?" (e.g., wheezing, shortness of breath, unable to speak, severe coughing)      Shortness of breath  2. ONSET: "When did this breathing problem begin?"      Ongoing for a couple of years  3. PATTERN "Does the difficult breathing come and go, or has it been constant since it started?"      Constant  4. SEVERITY: "How bad is your breathing?" (e.g., mild, moderate, severe)    - MILD: No SOB at rest, mild SOB with walking, speaks normally in sentences, can lie down, no retractions, pulse < 100.    - MODERATE: SOB at rest, SOB with minimal exertion and prefers to sit, cannot lie down flat, speaks in phrases, mild retractions, audible wheezing, pulse 100-120.    - SEVERE: Very SOB at rest, speaks in single words, struggling to breathe, sitting hunched forward, retractions, pulse >  120      Moderate to severe  5. RECURRENT SYMPTOM: "Have you had difficulty breathing before?" If Yes, ask: "When was the last time?" and "What happened that time?"      Yes, ongoing for 2 years  6. CARDIAC HISTORY: "Do you have any history of heart disease?" (e.g., heart attack, angina, bypass surgery, angioplasty)      No 7. LUNG HISTORY: "Do you have any history of lung disease?"  (e.g., pulmonary embolus, asthma, emphysema)     No 8. CAUSE: "What do you think is causing the breathing problem?"      Believes it's neurological  9. OTHER SYMPTOMS: "Do you have any other symptoms? (e.g., dizziness, runny nose, cough, chest pain, fever)     Multiple complaints of pain, not new with current symptoms 10. O2 SATURATION MONITOR:  "Do you use an oxygen saturation monitor (pulse oximeter) at home?" If Yes, ask: "What is your reading (oxygen level) today?" "What is your usual oxygen saturation reading?" (e.g., 95%)       95-96%  Protocols used: Breathing Difficulty-A-AH

## 2023-05-29 ENCOUNTER — Encounter (HOSPITAL_BASED_OUTPATIENT_CLINIC_OR_DEPARTMENT_OTHER): Payer: Self-pay | Admitting: Family Medicine

## 2023-05-29 ENCOUNTER — Ambulatory Visit (HOSPITAL_BASED_OUTPATIENT_CLINIC_OR_DEPARTMENT_OTHER): Payer: 59 | Admitting: Family Medicine

## 2023-05-29 VITALS — BP 136/88 | HR 70 | Ht 66.0 in | Wt 158.6 lb

## 2023-05-29 DIAGNOSIS — F22 Delusional disorders: Secondary | ICD-10-CM

## 2023-05-29 DIAGNOSIS — R053 Chronic cough: Secondary | ICD-10-CM

## 2023-05-29 DIAGNOSIS — F411 Generalized anxiety disorder: Secondary | ICD-10-CM | POA: Diagnosis not present

## 2023-05-29 DIAGNOSIS — R1310 Dysphagia, unspecified: Secondary | ICD-10-CM | POA: Insufficient documentation

## 2023-05-29 DIAGNOSIS — R52 Pain, unspecified: Secondary | ICD-10-CM | POA: Insufficient documentation

## 2023-05-29 NOTE — Progress Notes (Unsigned)
Subjective:   [name] Oct 31, 1955 05/29/2023  Chief Complaint  Patient presents with   Shortness of Breath    Patient is having problems with SOB. States he needs a referral sent to [facility]. Has been having a lot of pain all over his body. Recently went to UC as well as the ED.    HPI: [name] presents today for re-assessment and management of chronic medical conditions.  Patient states he is having shortness of breath  He is wanting to see [facility]  He states he has been researching this for years. He states he is working on referral to [facility] and reports would like his PCP to call to discuss his chronic condtions.   ID # Record [id number] [phone]  Chornic tic- coughing   Patient presents for a   "States part of his brain is gone" after taking medication.   Patient is able to stop coughing with talking and deep breathing instructions.  Patient states he is able to fall asleep. Denies being woken up frequently by any cough.   Patient has a list of eye pain,   States previous psychiatric medications caused all of his symptoms.   Reportes eye pain, teeth numb, losing hearing vision, light/sound sensitivity, sensitivity to heat. Pins, needles to burning entire body. Twitching, Migraines, Trouble swalling, communitcatin, nose drainage, anxiety, out of reality.   Reports neurotoxicity concerns due to gabapentin and reports it caused "kindling" to the brain. Reports these symptoms have been ongoing for over 6 months to 1 year.    The following portions of the patient's history were reviewed and updated as appropriate: past medical history, past surgical history, family history, social history, allergies, medications, and problem list.   Patient Active Problem List   Diagnosis Date Noted   Generalized pain 05/29/2023   Odynophagia 05/29/2023   MDD (major depressive disorder), recurrent episode, severe (HCC)  05/15/2023   Chronic hepatitis C (HCC) 05/07/2023   Cognitive changes 02/13/2023   Paranoia (HCC) 10/17/2021   No-show for appointment 05/10/2020   Generalized anxiety disorder 10/13/2019   Trouble swallowing 08/19/2019   Depression 08/18/2019   Disc degeneration, lumbar 10/17/2014   HLD (hyperlipidemia) 08/25/2014   HYPERTENSION, BENIGN - Diet Controlled 10/19/2008   Past Medical History:  Diagnosis Date   Alcohol abuse    Allergy    Bipolar disorder (HCC)    Chronic pain    lower back pain, "that has progressed"   Depression    Diverticulosis    Dyspnea    Hepatitis C    hx. of"states he no longer has"" is clear".   History of recreational drug use    "cocaine, marijuana, polysubstance" quit 1987.   HLD (hyperlipidemia)    Hypertension    Pancreatitis 2015   Panic attack    Sleep apnea    no cpap used,still has machine(use rarely)   Past Surgical History:  Procedure Laterality Date   CARDIAC CATHETERIZATION  2003   No CAD   EUS N/A 06/23/2014   Procedure: UPPER ENDOSCOPIC ULTRASOUND (EUS) LINEAR;  Surgeon: Rachael Fee, MD;  Location: [facility];  Service: Endoscopy;  Laterality: N/A;   sinus surgery     TONSILLECTOMY     Family History  Problem Relation Age of Onset   Heart disease Mother    Kidney disease Mother    COPD Mother    Lupus Mother    Heart disease Father    Breast cancer Maternal  Grandmother    Diabetes Maternal Grandfather    Outpatient Medications Prior to Visit  Medication Sig Dispense Refill   acetaminophen (TYLENOL) 325 MG tablet Take 650 mg by mouth every 6 (six) hours as needed.     propranolol (INDERAL) 20 MG tablet Take 1 tablet (20 mg total) by mouth 2 (two) times daily. 60 tablet 0   naproxen (NAPROSYN) 250 MG tablet Take 1 tablet (250 mg total) by mouth 2 (two) times daily with a meal. 60 tablet 0   No facility-administered medications prior to visit.   Allergies  Allergen Reactions   Diazepam Other (See Comments)     Ringing in ears, diarrhea, CNS issues-multiple reactions  Neurotoxicity   Morphine And Codeine Shortness Of Breath   Gabapentin Other (See Comments)    "Gets worse"     ROS: A complete ROS was performed with pertinent positives/negatives noted in the HPI. The remainder of the ROS are negative.    Objective:   Today's Vitals   05/29/23 1058  BP: (!) 151/99  Pulse: 70  SpO2: 97%  Weight: 158 lb 9.6 oz (71.9 kg)  Height: 5\' 6"  (1.676 m)    Physical Exam          GENERAL: Well-appearing, in NAD. Well nourished.  SKIN: Pink, warm and dry. No rash, lesion, ulceration, or ecchymoses.  Head: Normocephalic. NECK: Trachea midline. Full ROM w/o pain or tenderness. No lymphadenopathy.  EARS: Tympanic membranes are intact, translucent without bulging and without drainage. Appropriate landmarks visualized.  EYES: Conjunctiva clear without exudates. EOMI, PERRL, no drainage present.  NOSE: Septum midline w/o deformity. Nares patent, mucosa pink and non-inflamed w/o drainage. No sinus tenderness.  THROAT: Uvula midline. Oropharynx clear. Tonsils non-inflamed without exudate. Mucous membranes pink and moist.  RESPIRATORY: Chest wall symmetrical. Respirations even and non-labored. Breath sounds clear to auscultation bilaterally.  CARDIAC: S1, S2 present, regular rate and rhythm without murmur or gallops. Peripheral pulses 2+ bilaterally.  MSK: Muscle tone and strength appropriate for age. Joints w/o tenderness, redness, or swelling.  EXTREMITIES: Without clubbing, cyanosis, or edema.  NEUROLOGIC: No motor or sensory deficits. Steady, even gait. C2-C12 intact.  PSYCH/MENTAL STATUS: Alert, oriented x 3. Cooperative, appropriate mood and affect.   There are no preventive care reminders to display for this patient.  No results found for any visits on 05/29/23.  The ASCVD Risk score (Arnett DK, et al., 2019) failed to calculate for the following reasons:   Cannot find a previous HDL lab    Cannot find a previous total cholesterol lab     Assessment & Plan:  *** There are no diagnoses linked to this encounter.  No orders of the defined types were placed in this encounter.  Lab Orders  No laboratory test(s) ordered today   No images are attached to the encounter or orders placed in the encounter.  No follow-ups on file.    Patient to reach out to office if new, worrisome, or unresolved symptoms arise or if no improvement in patient's condition. Patient verbalized understanding and is agreeable to treatment plan. All questions answered to patient's satisfaction.    Hilbert Bible, [place]

## 2023-06-14 ENCOUNTER — Emergency Department (HOSPITAL_BASED_OUTPATIENT_CLINIC_OR_DEPARTMENT_OTHER)
Admission: EM | Admit: 2023-06-14 | Discharge: 2023-06-14 | Disposition: A | Discharge status: Home / Self Care | Attending: Emergency Medicine | Admitting: Emergency Medicine

## 2023-06-14 DIAGNOSIS — G894 Chronic pain syndrome: Secondary | ICD-10-CM | POA: Insufficient documentation

## 2023-06-14 DIAGNOSIS — R519 Headache, unspecified: Secondary | ICD-10-CM | POA: Diagnosis present

## 2023-06-14 LAB — URINALYSIS, ROUTINE W REFLEX MICROSCOPIC
Bilirubin Urine: NEGATIVE
Glucose, UA: NEGATIVE mg/dL
Hgb urine dipstick: NEGATIVE
Ketones, ur: NEGATIVE mg/dL
Leukocytes,Ua: NEGATIVE
Nitrite: NEGATIVE
Specific Gravity, Urine: 1.033 — ABNORMAL HIGH (ref 1.005–1.030)
pH: 6.5 (ref 5.0–8.0)

## 2023-06-14 LAB — COMPREHENSIVE METABOLIC PANEL
ALT: 29 U/L (ref 0–44)
AST: 22 U/L (ref 15–41)
Albumin: 4.6 g/dL (ref 3.5–5.0)
Alkaline Phosphatase: 66 U/L (ref 38–126)
Anion gap: 10 (ref 5–15)
BUN: 31 mg/dL — ABNORMAL HIGH (ref 8–23)
CO2: 23 mmol/L (ref 22–32)
Calcium: 9.5 mg/dL (ref 8.9–10.3)
Chloride: 107 mmol/L (ref 98–111)
Creatinine, Ser: 0.9 mg/dL (ref 0.61–1.24)
GFR, Estimated: >60 mL/min (ref >60)
Glucose, Bld: 140 mg/dL — ABNORMAL HIGH (ref 70–99)
Potassium: 4.3 mmol/L (ref 3.5–5.1)
Sodium: 140 mmol/L (ref 135–145)
Total Bilirubin: 0.5 mg/dL (ref 0.0–1.2)
Total Protein: 7.1 g/dL (ref 6.5–8.1)

## 2023-06-14 LAB — CBC WITH DIFFERENTIAL/PLATELET
Abs Immature Granulocytes: 0.03 10*3/uL (ref 0.00–0.07)
Basophils Absolute: 0.1 10*3/uL (ref 0.0–0.1)
Basophils Relative: 1 %
Eosinophils Absolute: 0.1 10*3/uL (ref 0.0–0.5)
Eosinophils Relative: 1 %
HCT: 45.1 % (ref 39.0–52.0)
Hemoglobin: 15.1 g/dL (ref 13.0–17.0)
Immature Granulocytes: 0 %
Lymphocytes Relative: 23 %
Lymphs Abs: 2.2 10*3/uL (ref 0.7–4.0)
MCH: 27.2 pg (ref 26.0–34.0)
MCHC: 33.5 g/dL (ref 30.0–36.0)
MCV: 81.1 fL (ref 80.0–100.0)
Monocytes Absolute: 0.8 10*3/uL (ref 0.1–1.0)
Monocytes Relative: 8 %
Neutro Abs: 6.6 10*3/uL (ref 1.7–7.7)
Neutrophils Relative %: 67 %
Platelets: 239 10*3/uL (ref 150–400)
RBC: 5.56 MIL/uL (ref 4.22–5.81)
RDW: 13.8 % (ref 11.5–15.5)
WBC: 9.9 10*3/uL (ref 4.0–10.5)
nRBC: 0 % (ref 0.0–0.2)

## 2023-06-14 LAB — CK: Total CK: 59 U/L (ref 49–397)

## 2023-06-14 LAB — RESP PANEL BY RT-PCR (RSV, FLU A&B, COVID)  RVPGX2
Influenza A by PCR: NEGATIVE
Influenza B by PCR: NEGATIVE
Resp Syncytial Virus by PCR: NEGATIVE
SARS Coronavirus 2 by RT PCR: NEGATIVE

## 2023-06-14 NOTE — ED Triage Notes (Signed)
 BIB [facility] for generalized body aches/pain and headache x2 years. Pt reports "I think I have neurotoxicity".

## 2023-06-14 NOTE — Discharge Instructions (Signed)
 Fortunately no concerning findings were noted on today's exam.  Follow-up with your doctor for further care.

## 2023-06-14 NOTE — ED Provider Notes (Signed)
 [place] EMERGENCY DEPARTMENT AT [facility] Provider Note   CSN: [id number] Arrival date & time: 06/14/23  1215     History  Chief Complaint  Patient presents with   Generalized Body Aches    [name] is a 68 y.o. male.  The history is provided by the patient and medical records. No language interpreter was used.     68 year old male history of bipolar, panic attack, chronic pain, hepatitis, alcohol abuse, paranoia brought here via EMS with complaint of aches and pain.  Patient report for more than 2 years he has had persistent body aches and pain difficulty eating, headache, and felt "I think I have neurotoxicity".  He mention he has been seen and evaluated by multiple provider for his complaint no specific diagnosis was made.  He has been googling his symptoms and is convinced he needs to be transferred to the [facility] clinic to be managed further.  He reports frustration that his provider Saying that his symptom is in his head.  He states he is not getting any oxygen for the past 2 years and he overall does not feel well.  No new changes.  He request to be transferred to [facility] clinic.  Home Medications Prior to Admission medications   Medication Sig Start Date End Date Taking? Authorizing Provider  acetaminophen (TYLENOL) 325 MG tablet Take 650 mg by mouth every 6 (six) hours as needed.    [provider]  propranolol (INDERAL) 20 MG tablet Take 1 tablet (20 mg total) by mouth 2 (two) times daily. 05/21/23   Verner Chol, MD      Allergies    Diazepam, Morphine and codeine, and Gabapentin    Review of Systems   Review of Systems  All other systems reviewed and are negative.   Physical Exam Updated Vital Signs BP (!) 168/92   Pulse 96   Temp 97.8 F (36.6 C) (Oral)   Resp (!) 24   SpO2 94%  Physical Exam Constitutional:      General: He is not in acute distress.    Appearance: He is well-developed.  HENT:     Head: Atraumatic.  Eyes:      Conjunctiva/sclera: Conjunctivae normal.  Cardiovascular:     Rate and Rhythm: Normal rate and regular rhythm.     Pulses: Normal pulses.     Heart sounds: Normal heart sounds.  Pulmonary:     Effort: Pulmonary effort is normal.     Breath sounds: Normal breath sounds. No wheezing, rhonchi or rales.  Abdominal:     Palpations: Abdomen is soft.     Tenderness: There is no abdominal tenderness.  Musculoskeletal:        General: Tenderness (Diffuse tenderness to palpation throughout body without any focal point tenderness.  Able to move all 4 extremities with equal effort.) present.     Cervical back: Normal range of motion and neck supple.  Skin:    Findings: No rash.  Neurological:     Mental Status: He is alert. Mental status is at baseline.     GCS: GCS eye subscore is 4. GCS verbal subscore is 5. GCS motor subscore is 6.     Cranial Nerves: Cranial nerves 2-12 are intact.     Sensory: Sensation is intact.     Motor: Motor function is intact.     ED Results / Procedures / Treatments   Labs (all labs ordered are listed, but only abnormal results are displayed) Labs Reviewed  COMPREHENSIVE  METABOLIC PANEL - Abnormal; Notable for the following components:      Result Value   Glucose, Bld 140 (*)    BUN 31 (*)    All other components within normal limits  URINALYSIS, ROUTINE W REFLEX MICROSCOPIC - Abnormal; Notable for the following components:   Specific Gravity, Urine 1.033 (*)    Protein, ur TRACE (*)    All other components within normal limits  RESP PANEL BY RT-PCR (RSV, FLU A&B, COVID)  RVPGX2  CBC WITH DIFFERENTIAL/PLATELET  CK    EKG None  Radiology No results found.  Procedures Procedures    Medications Ordered in ED Medications - No data to display  ED Course/ Medical Decision Making/ A&P                                 Medical Decision Making Amount and/or Complexity of Data Reviewed Labs: ordered.   BP (!) 168/92   Pulse 96   Temp 97.8  F (36.6 C) (Oral)   Resp (!) 24   SpO2 94%   44:79 PM   68 year old male history of bipolar, panic attack, chronic pain, hepatitis, alcohol abuse, paranoia brought here via EMS with complaint of aches and pain.  Patient report for more than 2 years he has had persistent body aches and pain difficulty eating, headache, and felt "I think I have neurotoxicity".  He mention he has been seen and evaluated by multiple provider for his complaint no specific diagnosis was made.  He has been googling his symptoms and is convinced he needs to be transferred to the [facility] clinic to be managed further.  He reports frustration that his provider Saying that his symptom is in his head.  He states he is not getting any oxygen for the past 2 years and he overall does not feel well.  No new changes.  He request to be transferred to [facility] clinic.  Exam overall reassuring, patient is laying in bed in no acute respiratory discomfort.  He grunts on occasion and moaning on occasion and he has diffuse tenderness throughout body on palpation without any focal point tenderness.  O2 sats at 98% on room air on the monitor.  -Labs ordered, independently viewed and interpreted by me.  Labs remarkable for cbg 140, negative covid/flu/rsv, ua without infection, electrolytes panel reassuring, normal total ck -The patient was maintained on a cardiac monitor.  I personally viewed and interpreted the cardiac monitored which showed an underlying rhythm of: nsr -Imaging not considered -This patient presents to the ED for concern of myalgia, this involves an extensive number of treatment options, and is a complaint that carries with it a high risk of complications and morbidity.  The differential diagnosis includes paranoia, myalgia, infection, electrolytes imbalance, fibromyalgia, chronic pain -Co morbidities that complicate the patient evaluation includes fibromyalgia, chronic pain, bipolar -Treatment includes  reassurance -Reevaluation of the patient after these medicines showed that the patient stayed the same -PCP office notes or outside notes reviewed -Discussion with attending Dr. Rosalia Hammers -Escalation to admission/observation considered: patients feels much better, is comfortable with discharge, and will follow up with PCP -Prescription medication considered, patient comfortable with home medication -Social Determinant of Health considered which includes depression, social isolation, physical inactivity         Final Clinical Impression(s) / ED Diagnoses Final diagnoses:  Chronic pain syndrome    Rx / DC Orders ED Discharge Orders  None         Fayrene Helper, PA-C 06/14/23 1508    Margarita Grizzle, MD 06/18/23 [phone]

## 2023-07-09 NOTE — Progress Notes (Signed)
 Part of his work up

## 2023-08-13 ENCOUNTER — Ambulatory Visit (HOSPITAL_BASED_OUTPATIENT_CLINIC_OR_DEPARTMENT_OTHER): Payer: 59 | Admitting: Family Medicine

## 2023-10-08 NOTE — ED Provider Notes (Signed)
 Patient placed in [facility] Look pathway, seen and evaluated for multiple complaints.  Pt endorses SOB, vision difficulties, and pain all over.  Pt states symptoms have been going on for 2 years, but symptoms worsened two days ago.  Pertinent exam findings include NAD.   Patient/parent counseled on process, plan, and necessity for staying for completing the evaluation.      Note By: Peyton Gammons, PA-C 1:46 PM    [facility] Emergency Department Emergency Department Provider Note  Provider at Bedside:  10/08/2023 4:07 PM  Chief Complaint: Shortness of breath  History of Present Illness:  History obtained from: patient  [name] is a 68 y.o. male with PMHx of bipolar disorder, pancreatitis, anxiety, pancreatitis chronic pain, who presents to the ED with complaints of he could not breathe, and he still hurts all over.  Patient states that this is all been going on for 2 years but worse over the past 3 days.  Patient states since he got bodyaches his vision is off he has a headache he hurts all over and this is all because he has neurotoxicity.  Patient constantly shakes and acts like he is hurting everywhere you touch him, and when you offered to give him any medications to help his symptoms, patient states that they all will cause worsening neurotoxicity and then he cannot take any of them.  Patient states that he really came here today because he felt like he was short of breath and has had a little bit of a cough.  He denies any numbness tingling or weakness to his extremities.  He states that his rest of the body and head hurt all over and that is been going on for 2 years as well.   ______________________ ROS: Pertinent positives and negatives per HPI. Pertinent past medical, surgical, social and family history records were reviewed. Current Medications and Allergies were reviewed.  Physical Exam   Vitals:   10/08/23 1330 10/08/23 1500 10/08/23 1600 10/08/23 1801   BP: 117/84 (!) 116/97 134/63   BP Location: Left arm     Patient Position: Sitting     Pulse: (!) 112 86 82 89  Resp: (!) 22 19 20 20   Temp: 98.4 F (36.9 C)     TempSrc: Oral     SpO2: 94% 97% 96% 97%  Weight: 68 kg (150 lb)     Height: 170.2 cm (5' 7)         Physical Exam Vitals and nursing note reviewed.  Constitutional:      General: He is not in acute distress.    Appearance: Normal appearance. He is normal weight. He is not ill-appearing.  HENT:     Head: Normocephalic and atraumatic.     Right Ear: External ear normal.     Left Ear: External ear normal.     Nose: Nose normal.     Mouth/Throat:     Mouth: Mucous membranes are moist.     Pharynx: Oropharynx is clear.   Eyes:     Pupils: Pupils are equal, round, and reactive to light.    Cardiovascular:     Rate and Rhythm: Normal rate and regular rhythm.     Heart sounds: Normal heart sounds.  Pulmonary:     Effort: Pulmonary effort is normal.     Breath sounds: Normal breath sounds.  Abdominal:     Palpations: Abdomen is soft.     Tenderness: There is no abdominal tenderness.   Musculoskeletal:  General: Normal range of motion.     Cervical back: Normal range of motion and neck supple.   Skin:    General: Skin is warm.   Neurological:     General: No focal deficit present.     Mental Status: He is alert.     Comments: Patient constantly was jerking his arms and legs around his hips he could not control the movements but when you would actually leave him alone in the room and then he would stop doing it.  Patient was also grunting and acting like he could not catch his breath but would not take any medications for any of the symptoms and just kept saying that this is all from neurotoxicity.  Psychiatric:        Mood and Affect: Mood normal.        Behavior: Behavior normal.     Results   EKG Impression:  My Interpretation: EKG For acute MI, arrhythmia, conduction abnormality, ischemia,  electrolyte abnormality was performed and showed EKG time: 1335 Interpretation time: 7:06 PM Rate: 108 Rhythm: sinus tachycardia Axis: Right axis deviation Intervals: Poor R wave progression ST-T Waves: Normal ST-T Waves Comparison with Old:  No  Labs: Lab Results (last 24 hours)     Procedure Component Value Ref Range Date/Time   Troponin, High Sensitive (2 Hr Rfx) [[id number]]  (Normal) Collected: 10/08/23 1632   Lab Status: Final result Specimen: Blood from Venous Updated: 10/08/23 1713    Troponin, High Sensitive 4 <20 ng/L     Comment: >= 20 ng/L INDICATES MYOCARDIAL DAMAGE.THE DIAGNOSIS OF MYOCARDIAL INFARCTION REQUIRES CLINICAL CORRELATION.   Elevated troponin may also be due to myocardial stress from a variety of causes.   Alkaline Phos (ALP) levels >400 U/L may cause falsely elevated results. Troponin test is invalid in patients taking asfotase alpha.   This troponin assay was not validated for evaluation of troponin in patients younger than 21 years. There are no ranges established for patients younger than 21 years. Therefore, laboratory results for these patients should be  interpreted with caution.      Urinalysis with Reflex to Microscopic [[id number]]  (Abnormal) Collected: 10/08/23 1539   Lab Status: Final result Specimen: Urine from Clean Catch Updated: 10/08/23 1601    Color, Urine Yellow Yellow     Clarity, Urine Cloudy* Clear     Specific Gravity, Urine 1.027* 1.005 - 1.025     pH, Urine 5.5 5.0 - 8.0     Protein, Urine 10 Negative, 10 , 20  mg/dL     Glucose, Urine Negative Negative, 30 , 50  mg/dL     Ketones, Urine Negative Negative, Trace mg/dL     Bilirubin, Urine Negative Negative     Blood, Urine Trace Negative, Trace     Nitrite, Urine Negative Negative     Leukocyte Esterase, Urine Negative Negative, 25     Urobilinogen, Urine Normal <2.0 mg/dL     WBC, Urine 0-5 <6 /HPF     RBC, Urine 0-2 0 - 2 /HPF     Bacteria, Urine Rare None Seen, Rare /HPF      Hyaline Casts, Urine 6-10* 0 - 2 /LPF     Uric Acid Crystals, Urine Present* None Seen /HPF    Drug of Abuse 7 Panel [[id number]]  (Normal) Collected: 10/08/23 1539   Lab Status: Final result Specimen: Urine from Clean Catch Updated: 10/08/23 1601    Amphetamines Screen, Urine Negative Negative     Comment: Cutoff =  1000 ng/mL      Barbiturates Screen, Urine Negative Negative     Comment: Cutoff = 200 ng/mL      Benzodiazepines Screen, Urine Negative Negative     Comment: Cutoff = 200 ng/mL      Cocaine Screen, Urine Negative Negative     Comment: Cutoff = 300 ng/mL      Opiates Screen, Urine Negative Negative     Comment: Cutoff = 300 ng/mL      Fentanyl Screen, Urine Negative Negative     Comment: Cutoff = 1 ng/mL      Marijuana (THC) Screen, Urine Negative Negative     Comment: Cutoff = 50 ng/mL      Creatinine, Urine 214 >=20 mg/dL    Narrative:     These results are for medical purposes only.  The screening procedure aims to detect the presence of drugs and it is regarded as presumptive (qualitative analysis).  Positive screening detection requires confirmation using more sensitive and specific quantitative methods, such as high performance liquid chromatography and mass spectrometry.   CBC with Differential [[id number]] Collected: 10/08/23 1355   Lab Status: Final result Specimen: Blood from Venous Updated: 10/08/23 1402   Narrative:     The following orders were created for panel order CBC with Differential. Procedure                               Abnormality         Status                    ---------                               -----------         ------                    CBC with Differential[[id number]]                           Final result               Please view results for these tests on the individual orders.   Comprehensive Metabolic Panel [[id number]]  (Abnormal) Collected: 10/08/23 1355   Lab Status: Final result Specimen: Blood from Venous Updated:  10/08/23 1426    Sodium 139 136 - 145 mmol/L     Potassium 3.7 3.4 - 4.5 mmol/L     Chloride 110* 98 - 107 mmol/L     CO2 21 21 - 31 mmol/L     Anion Gap 8 6 - 14 mmol/L     Glucose, Random 134* 70 - 99 mg/dL     Blood Urea Nitrogen (BUN) 25 7 - 25 mg/dL     Creatinine 8.88 9.29 - 1.30 mg/dL     eGFR 73 >40 fO/fpw/8.26f7     Comment: GFR estimated by CKD-EPI equations(NKF 2021).   Recommend confirmation of Cr-based eGFR by using Cys-based eGFR and other filtration markers (if applicable) in complex cases and clinical decision-making, as needed.      Albumin 4.3 3.5 - 5.7 g/dL     Total Protein 7.1 6.4 - 8.9 g/dL     Bilirubin, Total 0.4 0.3 - 1.0 mg/dL     Alkaline Phosphatase (ALP) 73 34 - 104 U/L  Aspartate Aminotransferase (AST) 22 13 - 39 U/L     Alanine Aminotransferase (ALT) 27 7 - 52 U/L     Calcium 8.7 8.6 - 10.3 mg/dL     BUN/Creatinine Ratio --    Comment: Creatinine is normal, ratio is not clinically indicated.      Troponin, High Sensitive (0 Hr + 2 Hr Rfx) [[id number]]  (Normal) Collected: 10/08/23 1355   Lab Status: Final result Specimen: Blood from Venous Updated: 10/08/23 1435    Troponin, High Sensitive 3 <20 ng/L     Comment: >= 20 ng/L INDICATES MYOCARDIAL DAMAGE.THE DIAGNOSIS OF MYOCARDIAL INFARCTION REQUIRES CLINICAL CORRELATION.   Elevated troponin may also be due to myocardial stress from a variety of causes.   Alkaline Phos (ALP) levels >400 U/L may cause falsely elevated results. Troponin test is invalid in patients taking asfotase alpha.   This troponin assay was not validated for evaluation of troponin in patients younger than 21 years. There are no ranges established for patients younger than 21 years. Therefore, laboratory results for these patients should be  interpreted with caution.      B-Type Natriuretic Peptide (BNP) [[id number]]  (Normal) Collected: 10/08/23 1355   Lab Status: Final result Specimen: Blood from Venous Updated: 10/08/23  1429    B-Type Natriuretic Peptide (BNP) 9 <100 pg/mL    CBC with Differential [[id number]] Collected: 10/08/23 1355   Lab Status: Final result Specimen: Blood from Venous Updated: 10/08/23 1402    WBC 8.46 4.40 - 11.00 10*3/uL     RBC 5.69 4.50 - 5.90 10*6/uL     Hemoglobin 15.6 14.0 - 17.5 g/dL     Hematocrit 53.9 58.4 - 50.4 %     Mean Corpuscular Volume (MCV) 80.9 80.0 - 96.0 fL     Mean Corpuscular Hemoglobin (MCH) 27.5 27.5 - 33.2 pg     Mean Corpuscular Hemoglobin Conc (MCHC) 33.9 33.0 - 37.0 g/dL     Red Cell Distribution Width (RDW) 15.0 12.3 - 17.0 %     Platelet Count (PLT) 225 150 - 450 10*3/uL     Mean Platelet Volume (MPV) 7.0 6.8 - 10.2 fL     Neutrophils % 48 %     Lymphocytes % 40 %     Monocytes % 9 %     Eosinophils % 2 %     Basophils % 1 %     Neutrophils Absolute 4.10 1.80 - 7.80 10*3/uL     Lymphocytes # 3.40 1.00 - 4.80 10*3/uL     Monocytes # 0.80 0.00 - 0.80 10*3/uL     Eosinophils # 0.20 0.00 - 0.50 10*3/uL     Basophils # 0.10 0.00 - 0.20 10*3/uL    Ethanol [[id number]]  (Normal) Collected: 10/08/23 1355   Lab Status: Final result Specimen: Blood from Venous Updated: 10/08/23 1426    Ethanol <10 <10 mg/dL        My interpretation of patient's labs shows that the urinalysis is negative, the urine tox screen was negative, the chemistries were essentially normal except for slightly elevated glucose, initial and delta troponins were negative, BNP was normal, alcohol level was less than 10 and white blood cell count was normal.  CXR Impression: (Interpreted by me) My Interpretation: Chest xray for pneumonia, pneumothorax, pleural effusion, pulmonary edema, cardiomegaly, aortic abnormality was performed and did not show any acute cardiopulmonary findings.  Impression of additional imaging studies include: No additional imaging performed  Imaging: Radiology Results (last 72 hours)  Procedure Component Value Units Date/Time   XR Chest 1 View  [[id number]] Collected: 10/08/23 1404   Order Status: Completed Updated: 10/08/23 1407   Narrative:     CLINICAL DATA:  Shortness of breath  EXAM: CHEST  1 VIEW portable  COMPARISON:  X-ray 05/13/2023 and older  FINDINGS: Underinflation. No consolidation, pneumothorax or effusion. No edema. Normal cardiopericardial silhouette. Film is under penetrated.    Impression:     Underinflation.  No acute cardiopulmonary disease.   Electronically Signed   By: Ranell Bring M.D.   On: 10/08/2023 14:05         Procedures   Procedures  Evidence Based Calculators      ED Course   ED Course as of 10/08/23 1906  Wed Oct 08, 2023  1606 Patient's initial vital signs showed he is tachycardic and mildly tachypneic. [LT]  1606 Intervention-patient is constantly shakes and keeps saying that he has a problem with neurotoxicity, he cannot take any medications at home they were all caused him to get worse.  He states that he came here because of his breathing and he wanted to make sure that he was not going to quit breathing.  Patient is getting routine labs, cardiac enzymes, urinalysis, urine toxicology screen, BNP, alcohol level, EKG, and chest x-ray at this time. [LT]  1852 Patient is refusing to take any medications for his symptoms, and wants to see our behavioral health team because he says that he gets like this when his bipolar gets really uncontrollable. [LT]  [id number] Patient will be placed on our psych observation, patient has been medically cleared. [LT]  1852 The patient has been placed in psychiatric observation due to the need to provide a safe environment for the patient while obtaining psychiatric consultation and evaluation, as well as ongoing medical and medication management to treat the patient's condition.  The patient has not been placed under full IVC at this time.    [LT]  1906 Pt will be signed out to Dr, Zackowski at 9pm. [LT]    ED Course User Index [LT] Rock Dannielle Birmingham, MD    Medical Decision Making   External records were reviewed: Patient has had several ED visits for the same over the past 6 months who was admitted on 05/15/2023 for major depressive disorder and chronic pain _________________________  [name] is a 68 y.o. malewho presents to the ED with complaints of he could not breathe, and he still hurts all over.  Patient states that this is all been going on for 2 years but worse over the past 3 days.  Patient states since he got bodyaches his vision is off he has a headache he hurts all over and this is all because he has neurotoxicity.  Patient constantly shakes and acts like he is hurting everywhere you touch him, and when you offered to give him any medications to help his symptoms, patient states that they all will cause worsening neurotoxicity and then he cannot take any of them.  Patient states that he really came here today because he felt like he was short of breath and has had a little bit of a cough.  He denies any numbness tingling or weakness to his extremities.  He states that his rest of the body and head hurt all over and that is been going on for 2 years as well . On my initial evaluation, patient just constantly shakes and throws himself around as if he is having chronic pain  issues but he is not having any respiratory distress..  The following differentials were considered: DDX: MI, angina, aortic dissection, pneumonia, PE, pneumothorax, musculoskeletal pain, cardiomyopathy, bronchitis, COPD, CHF, metabolic disorder, occult infection.    Pertinent studies were obtained, with results listed in chart above.  results interpreted as above.  Based on ED workup, findings are consistent with shortness of breath, chronic pain syndrome, tremors of the nervous system, history of bipolar disorder.    Patient received no medications in the ER because he refused all medications that we can give him to try to help his symptoms..  On  reevaluation, symptoms are improved.  Clinical Assessment/Plan: Patient presented with a very bizarre behavior where he was jerking his arms and legs and acting like he has some type of neurotoxicity, where he refused all medications that we tried to offer him and stated that everyone of him make it worse.  Patient was also complaining of feeling short of breath, but he would not take any medications for that either.  When we did a complete workup and the entire workup was negative, patient was going to be discharged home because I told him that there was nothing we can do for him if he refuses to take all medications, and that is when he decided that he wanted to be evaluated by our behavioral health team for his bipolar disorder.  He states that every time his bipolar disorder gets out of control and this is how he acts.  Discussion of management or test interpretation with external provider(s): Behavioral health/psychiatry was consulted.   Clinical Complexity/Risk   Patient's presentation is most consistent with acute presentation with potential threat to life or bodily function.  Patient's impaired access to primary care increases the complexity of managing their  presentation with shortness of breath.     Provider time spent in patient care today, inclusive of but not limited to clinical reassessment, review of diagnostic studies, and discharge preparation, was greater than 30 minutes.  OTC medications: no, pending behavioral health disposition Prescription medications discussed: no, pending behavioral health disposition  ED Clinical Impression   Diagnoses that have been ruled out:  None  Diagnoses that are still under consideration:  None  Final diagnoses:  Shortness of breath  Chronic pain syndrome  Tremors of nervous system  History of bipolar disorder    ED Assessment/Plan   ED Disposition     None       DISCHARGE MEDICATIONS   Medication List     ASK your  doctor about these medications    * mirtazapine  15 mg tablet Commonly known as: REMERON  Take 15 mg by mouth nightly.   * mirtazapine  45 mg tablet Commonly known as: REMERON  Take  by mouth.   nitroglycerin  0.3 mg SL tablet Commonly known as: NITROSTAT  Place 0.3 mg under the tongue.   orphenadrine 100 mg tablet Commonly known as: NORFLEX 1 TABLET BY MOUTH EVERY TWELVE HOURS AS NEEDED FOR PAIN   oxyCODONE  10 mg Tab Commonly known as: ROXICODONE  Take 10 mg by mouth.   pantoprazole  40 mg EC tablet Commonly known as: PROTONIX  TAKE ONE TABLET DAILY 30 MINUTES BEFORE AM MEAL   propranoloL  60 mg tablet Commonly known as: INDERAL  TAKE 1 TABLET BY MOUTH TWICE A DAY   sucralfate  100 mg/mL oral suspension Commonly known as: CARAFATE  Take  by mouth.   Super Thera Vite M Tab Generic drug: multivitamin,tx-minerals Take 1 tablet by mouth.   turmeric root extract 500 mg Cap  Take 1 tablet by mouth.      * * This list has 2 medication(s) that are the same as other medications prescribed for you. Read the directions carefully, and ask your doctor or other care provider to review them with you.           FOLLOW UP No follow-up provider specified.  ____________________________ Scribe's Attestation: This document serves as a record of services personally performed by Rock Birmingham, MD. It was created on their behalf by Rock Dannielle Birmingham, MD, a trained medical scribe. The creation of this record is the provider's dictation and/or activities during the visit.   Electronically signed by: Rock Dannielle Birmingham, MD 10/08/2023 4:07 PM      *Some images could not be shown.

## 2023-10-09 NOTE — ED Provider Notes (Signed)
 EM Observation Re-evaluation note  [name] is a 68 y.o. male, seen on rounds today.  The patient presented with a chief complaint of delusional.    Temp:  [98.4 F (36.9 C)] 98.4 F (36.9 C) Heart Rate:  [82-112] 100 Resp:  [19-22] 22 BP: (116-146)/(63-114) 146/113 SpO2:  [94 %-97 %] 97 %   Psychiatric examination: resting comfortably.   MDM:  I have reviewed the labs performed to date as well as medications administered while in observation. Recent changes in the last 24 hours include none.  Current plan is for reassessment by psychiatry this morning for further recommendations, also looking for any open beds to outside facilities.   Patient is not under full IVC at this time.   PT was cleared by psychiatry for discharge. No medications recommended.  This document serves as a record of services personally performed by Leita Phenix, MD. It was created on their behalf by Asberry Opal, Scribe, a trained medical scribe. The creation of this record is the provider's dictation and/or activities during the visit.   Electronically signed by: Asberry Opal, Scribe 10/09/2023 6:37 AM  I agree the documentation is accurate and complete. Reviewed by: Leita Phenix, MD 10/09/2023 6:37 AM

## 2023-10-24 ENCOUNTER — Emergency Department (HOSPITAL_COMMUNITY)
Admission: EM | Admit: 2023-10-24 | Discharge: 2023-10-24 | Discharge status: Against Medical Advice | Attending: Emergency Medicine | Admitting: Emergency Medicine

## 2023-10-24 ENCOUNTER — Other Ambulatory Visit: Payer: Self-pay

## 2023-10-24 ENCOUNTER — Emergency Department (HOSPITAL_COMMUNITY)

## 2023-10-24 DIAGNOSIS — Z5329 Procedure and treatment not carried out because of patient's decision for other reasons: Secondary | ICD-10-CM | POA: Insufficient documentation

## 2023-10-24 DIAGNOSIS — R0602 Shortness of breath: Secondary | ICD-10-CM | POA: Diagnosis present

## 2023-10-24 DIAGNOSIS — R0600 Dyspnea, unspecified: Secondary | ICD-10-CM

## 2023-10-24 DIAGNOSIS — R109 Unspecified abdominal pain: Secondary | ICD-10-CM | POA: Insufficient documentation

## 2023-10-24 MED ORDER — LACTATED RINGERS IV BOLUS: 1000.0000 mL | Freq: Once | INTRAVENOUS | Status: DC

## 2023-10-24 NOTE — ED Provider Notes (Signed)
 [place] EMERGENCY DEPARTMENT AT [facility] Provider Note   CSN: [id number] Arrival date & time: 10/24/23  1653     Patient presents with: Pain and Shortness of Breath   [name] is a 68 y.o. male.    Shortness of Breath    Pt states he has been having shortness of breath for two years.  Pt states he hurts all over.  He feels like his kidneys are not working.  His urine is foamy.  His chest is hurting.   They have tried multiple medications in the past.  He has been to the hospital multiple time and they have told him it is neurologic.  Pt states he wants to go into hospice.    Prior to Admission medications   Medication Sig Start Date End Date Taking? Authorizing Provider  acetaminophen  (TYLENOL ) 325 MG tablet Take 650 mg by mouth every 6 (six) hours as needed.    [provider]  propranolol  (INDERAL ) 20 MG tablet Take 1 tablet (20 mg total) by mouth 2 (two) times daily. 05/21/23   Donnelly Mellow, MD    Allergies: Diazepam, Morphine  and codeine, and Gabapentin     Review of Systems  Respiratory:  Positive for shortness of breath.     Updated Vital Signs BP (!) 119/105 (BP Location: Left Arm)   Pulse (!) 108   Temp 98.4 F (36.9 C) (Oral)   Resp 16   Ht 1.676 m (5' 6)   Wt 71.9 kg   SpO2 97%   BMI 25.58 kg/m   Physical Exam Vitals and nursing note reviewed.  Constitutional:      Appearance: He is well-developed. He is not diaphoretic.  HENT:     Head: Normocephalic and atraumatic.     Right Ear: External ear normal.     Left Ear: External ear normal.  Eyes:     General: No scleral icterus.       Right eye: No discharge.        Left eye: No discharge.     Conjunctiva/sclera: Conjunctivae normal.  Neck:     Trachea: No tracheal deviation.  Cardiovascular:     Rate and Rhythm: Normal rate and regular rhythm.  Pulmonary:     Effort: Tachypnea and accessory muscle usage present. No respiratory distress.     Breath sounds: Normal  breath sounds. No stridor. No wheezing or rales.  Abdominal:     General: Bowel sounds are normal. There is no distension.     Palpations: Abdomen is soft.     Tenderness: There is abdominal tenderness. There is no guarding or rebound.  Musculoskeletal:        General: No tenderness or deformity.     Cervical back: Neck supple.  Skin:    General: Skin is warm and dry.     Findings: No rash.  Neurological:     General: No focal deficit present.     Mental Status: He is alert.     Cranial Nerves: No cranial nerve deficit, dysarthria or facial asymmetry.     Sensory: No sensory deficit.     Motor: No abnormal muscle tone or seizure activity.     Coordination: Coordination normal.  Psychiatric:        Mood and Affect: Mood normal.     (all labs ordered are listed, but only abnormal results are displayed) Labs Reviewed  COMPREHENSIVE METABOLIC PANEL WITH GFR  CBC  LIPASE, BLOOD  TROPONIN I (HIGH SENSITIVITY)  EKG: None  Radiology: No results found.   Procedures   Medications Ordered in the ED  lactated ringers  bolus 1,000 mL (has no administration in time range)                                    Medical Decision Making Amount and/or Complexity of Data Reviewed Labs: ordered. Radiology: ordered.   Patient presented with complaints of chest pain abdominal pain difficulty breathing.  Patient reports he has had these issues for years.  He has seen multiple doctors in the past.  Patient states he has looked it up on Google and he is certain that he has a neurologic condition.  Patient was recently seen at [facility] on the second for similar symptoms.  Patient also has had multiple visits in the past for issues with chest pain and difficulty breathing.  No clear etiology.  Discussed looking into his symptoms today.  Patient is frustrated and states he does not want anything done he just wants to get into hospice.  Explained to patient I am not sure I can get him  into hospice but we can certainly look into what is causing his symptoms and check for an emergency issue.  Patient was frustrated and stated he would just leave.  Pt did not stay for any workup other than my initial exam.     Final diagnoses:  Dyspnea, unspecified type    ED Discharge Orders     None          Randol Simmonds, MD 10/24/23 [phone]

## 2023-10-24 NOTE — ED Notes (Addendum)
 Pt seen walking out of department. Per provider pt stated if you do not put me in hospice I am not staying.

## 2023-10-24 NOTE — ED Triage Notes (Signed)
 Arrive EMS for shob. States has been going through this for a while, no one listening to him. Also reports pain all over, not complaining of any new pain. Pt has no actually diagnosis, states he wants to be evaluated for hospice.

## 2024-01-06 ENCOUNTER — Encounter (HOSPITAL_COMMUNITY): Payer: Self-pay | Admitting: Emergency Medicine

## 2024-01-06 ENCOUNTER — Other Ambulatory Visit: Payer: Self-pay

## 2024-01-06 ENCOUNTER — Emergency Department (HOSPITAL_COMMUNITY)
Admission: EM | Admit: 2024-01-06 | Discharge: 2024-01-07 | Disposition: A | Discharge status: Home / Self Care | Attending: Emergency Medicine | Admitting: Emergency Medicine

## 2024-01-06 DIAGNOSIS — F22 Delusional disorders: Secondary | ICD-10-CM | POA: Diagnosis present

## 2024-01-06 DIAGNOSIS — F13939 Sedative, hypnotic or anxiolytic use, unspecified with withdrawal, unspecified: Secondary | ICD-10-CM | POA: Insufficient documentation

## 2024-01-06 DIAGNOSIS — R531 Weakness: Secondary | ICD-10-CM | POA: Diagnosis not present

## 2024-01-06 DIAGNOSIS — M791 Myalgia, unspecified site: Secondary | ICD-10-CM | POA: Insufficient documentation

## 2024-01-06 DIAGNOSIS — G894 Chronic pain syndrome: Secondary | ICD-10-CM

## 2024-01-06 DIAGNOSIS — F13239 Sedative, hypnotic or anxiolytic dependence with withdrawal, unspecified: Secondary | ICD-10-CM | POA: Diagnosis not present

## 2024-01-06 DIAGNOSIS — F411 Generalized anxiety disorder: Secondary | ICD-10-CM | POA: Diagnosis not present

## 2024-01-06 LAB — CBC
HCT: 49.1 % (ref 39.0–52.0)
Hemoglobin: 15.8 g/dL (ref 13.0–17.0)
MCH: 26.2 pg (ref 26.0–34.0)
MCHC: 32.2 g/dL (ref 30.0–36.0)
MCV: 81.6 fL (ref 80.0–100.0)
Platelets: 285 K/uL (ref 150–400)
RBC: 6.02 MIL/uL — ABNORMAL HIGH (ref 4.22–5.81)
RDW: 13.9 % (ref 11.5–15.5)
WBC: 9.2 K/uL (ref 4.0–10.5)
nRBC: 0 % (ref 0.0–0.2)

## 2024-01-06 LAB — COMPREHENSIVE METABOLIC PANEL WITH GFR
ALT: 33 U/L (ref 0–44)
AST: 37 U/L (ref 15–41)
Albumin: 4.6 g/dL (ref 3.5–5.0)
Alkaline Phosphatase: 105 U/L (ref 38–126)
Anion gap: 14 (ref 5–15)
BUN: 20 mg/dL (ref 8–23)
CO2: 21 mmol/L — ABNORMAL LOW (ref 22–32)
Calcium: 10 mg/dL (ref 8.9–10.3)
Chloride: 105 mmol/L (ref 98–111)
Creatinine, Ser: 1.11 mg/dL (ref 0.61–1.24)
GFR, Estimated: >60 mL/min (ref >60)
Glucose, Bld: 130 mg/dL — ABNORMAL HIGH (ref 70–99)
Potassium: 4.1 mmol/L (ref 3.5–5.1)
Sodium: 140 mmol/L (ref 135–145)
Total Bilirubin: 0.5 mg/dL (ref 0.0–1.2)
Total Protein: 7.6 g/dL (ref 6.5–8.1)

## 2024-01-06 NOTE — ED Triage Notes (Signed)
 PT reports pain all over that feels like pins and needles. Pt reporting he can't sleep and looses consciousness. Reports his vision is blurry and his hands and feet are blue. Reports the blue hands have been going on for 2 years. Also reporting body jerking and muscles twitching. Goes on to report migraines and trouble drinking.

## 2024-01-07 DIAGNOSIS — F411 Generalized anxiety disorder: Secondary | ICD-10-CM

## 2024-01-07 DIAGNOSIS — F13239 Sedative, hypnotic or anxiolytic dependence with withdrawal, unspecified: Secondary | ICD-10-CM

## 2024-01-07 NOTE — ED Notes (Signed)
 Requested pt get dressed out into purple scrubs since he will be seeing TTS for further evaluation. Pt refused. Pt is voluntary and is aware he is able to leave. Pt requests to stay to speak with psychiatry.

## 2024-01-07 NOTE — ED Notes (Signed)
 PT REFUSED TO DRESS OUT

## 2024-01-07 NOTE — ED Notes (Signed)
 [facility] called pts brother Signe Ming to inform him that pt is here in the [facility] ED, is being cleared by the provider. [facility] left a HIPAA compliant message to return the call.   Chesley Holt, [facility]  01/07/24

## 2024-01-07 NOTE — ED Notes (Signed)
 Pt dressed out, wanded, IV removed, two bags of belongings placed in locker 37

## 2024-01-07 NOTE — ED Provider Notes (Signed)
 [name] Provider Note   CSN: [id number] Arrival date & time: 01/06/24  1811     Patient presents with: Pain   [name] is a 68 y.o. male.   The history is provided by the patient and medical records.  [name] is a 68 y.o. male who presents to the Emergency Department complaining of pain. He presents the emergency department for evaluation of pain throughout his entire body described as pins and needles as well as his hands turning blue. He believes this is due to neurotoxicity and he wants testing and workup for this. He states the pain is so bad it makes it difficult to live. He does not want to kill himself but he does wish to die. He states that the pain and weakness is making it difficult for him to breathe.     Prior to Admission medications   Medication Sig Start Date End Date Taking? Authorizing Provider  acetaminophen  (TYLENOL ) 325 MG tablet Take 650 mg by mouth every 6 (six) hours as needed.    [provider]  propranolol  (INDERAL ) 20 MG tablet Take 1 tablet (20 mg total) by mouth 2 (two) times daily. 05/21/23   Jadapalle, Sree, MD    Allergies: Diazepam, Morphine  and codeine, and Gabapentin     Review of Systems  All other systems reviewed and are negative.   Updated Vital Signs BP (!) 154/110 (BP Location: Right Arm)   Pulse 83   Temp 98.2 F (36.8 C) (Oral)   Resp 20   SpO2 100%   Physical Exam Vitals and nursing note reviewed.  Constitutional:      Appearance: He is well-developed.  HENT:     Head: Normocephalic and atraumatic.  Cardiovascular:     Rate and Rhythm: Normal rate and regular rhythm.     Heart sounds: No murmur heard. Pulmonary:     Effort: Pulmonary effort is normal. No respiratory distress.     Breath sounds: Normal breath sounds.  Abdominal:     Palpations: Abdomen is soft.     Tenderness: There is no abdominal tenderness. There is no guarding or rebound.   Musculoskeletal:        General: No swelling or tenderness.  Skin:    General: Skin is warm and dry.  Neurological:     Mental Status: He is alert and oriented to person, place, and time.  Psychiatric:     Comments: Anxious. Pressured speech with delusions and paranoia.     (all labs ordered are listed, but only abnormal results are displayed) Labs Reviewed  CBC - Abnormal; Notable for the following components:      Result Value   RBC 6.02 (*)    All other components within normal limits  COMPREHENSIVE METABOLIC PANEL WITH GFR - Abnormal; Notable for the following components:   CO2 21 (*)    Glucose, Bld 130 (*)    All other components within normal limits    EKG: EKG Interpretation Date/Time:  Tuesday January 06 2024 18:21:53 EDT Ventricular Rate:  112 PR Interval:  135 QRS Duration:  98 QT Interval:  340 QTC Calculation: 465 R Axis:   159  Text Interpretation: Sinus tachycardia Right axis deviation Abnormal R-wave progression, late transition Confirmed by Griselda Norris [phone] on 01/07/2024 12:44:23 AM  Radiology: No results found.   Procedures   Medications Ordered in the ED - No data to display  Medical Decision Making Amount and/or Complexity of Data Reviewed Labs: ordered.   Patient with history of generalized anxiety disorder here requesting workup for what he believes is neurotoxicity and states that he cannot take any medications. He is very paranoid with delusions about toxins that are within his body and that [facility] clinic can explain all of these if they are googled. He is requesting PET scan. On evaluation he is non-toxic appearing with no focal neurologic deficits. Labs are reassuring. Discussed that this is an outpatient workup but there is concern for overriding mental health disorder that is driving his symptoms. Offered psychiatry evaluation for further recommendations. Patient is medically cleared for  psychiatric evaluation and treatment.      Final diagnoses:  None    ED Discharge Orders     None          Griselda Norris, MD 01/07/24 [phone]

## 2024-01-07 NOTE — ED Notes (Signed)
 Patient discharged off unit to home per provider. Patient alert, calm, cooperative, no s/s of distress at time of discharge. Discharge information given to and reviewed with patient, with acknowledged understanding. Belongings given to patient. Patient ambulated off unit, escorted by RN. Patient transported by self.

## 2024-01-07 NOTE — Progress Notes (Addendum)
 [facility] Liaison Note  This patient is active with [facility] and community counseling programs.  We will follow for discharge disposition.  Please call with any questions or concerns.  Thank you, Randine Nail, BSN, [facility] [phone]

## 2024-01-07 NOTE — Consult Note (Signed)
 [facility] Psychiatric Consult Initial  Patient Name: .[name]  MRN: [id number]  DOB: 12/19/1955  Consult Order details:  Orders (From admission, onward)     Start     Ordered   01/07/24 0312  CONSULT TO CALL ACT TEAM       Ordering Provider: Griselda Norris, MD  Provider:  (Not yet assigned)  Question:  Reason for Consult?  Answer:  Delusions, paranoia   01/07/24 0311             Mode of Visit: In person    Psychiatry Consult Evaluation  Service Date: January 07, 2024 LOS:  LOS: 0 days  Chief Complaint I've been struggling since I stopped using valium in 2020.  Primary Psychiatric Diagnoses  Generalized anxiety disorder 2.   Benzodiazepine Protracted Withdrawal Syndrome  Assessment  [name] is a 68 y.o. male admitted: Presented to the EDfor 01/06/2024  6:16 PM for brought in by self for pins and needles pain, difficulty sleeping, muscle twitching and difficulty swallowing. He carries the psychiatric diagnoses of GAD, MDD with psychotic features, cannabis use, remote HX of heroine overdose, benzodiazepine abuse in remission and paranoid disorder and has a past medical history of  HTN, hyperlipidemia, hepatitis C, cerebral atrophy and atherosclerosis of aorta.   His current presentation of anxious frustration is most consistent with generalized anxiety disorder. He meets criteria for outpatient follow up based on not being a danger to himself or others.  Current outpatient psychotropic medications include none and historically he has had a N/A response to these medications. He was compliant with medications prior to admission as evidenced by patient. On initial examination, patient is cooperative and anxious. Please see plan below for detailed recommendations.   Diagnoses:  Active Hospital problems: Principal Problem:   Generalized anxiety disorder    Plan   ## Psychiatric Medication Recommendations:  Patient declines any psychiatric medications at this time  ##  Medical Decision Making Capacity: Not specifically addressed in this encounter  ## Further Work-up:  -- most recent EKG on 01/06/2024 had QtC of 465 -- Pertinent labwork reviewed earlier this admission includes: CBC and CMP   ## Disposition:-- There are no psychiatric contraindications to discharge at this time  ## Behavioral / Environmental: - No specific recommendations at this time.     ## Safety and Observation Level:  - Based on my clinical evaluation, I estimate the patient to be at low risk of self harm in the current setting. - At this time, we recommend  routine. This decision is based on my review of the chart including patient's history and current presentation, interview of the patient, mental status examination, and consideration of suicide risk including evaluating suicidal ideation, plan, intent, suicidal or self-harm behaviors, risk factors, and protective factors. This judgment is based on our ability to directly address suicide risk, implement suicide prevention strategies, and develop a safety plan while the patient is in the clinical setting. Please contact our team if there is a concern that risk level has changed.  CSSR Risk Category:C-SSRS RISK CATEGORY: No Risk  Suicide Risk Assessment: Patient has following modifiable risk factors for suicide: current symptoms: anxiety/panic, insomnia, impulsivity, anhedonia, hopelessness and recent psychiatric hospitalization, which we are addressing by recommending outpatient follow up. Patient has following non-modifiable or demographic risk factors for suicide: male gender and psychiatric hospitalization Patient has the following protective factors against suicide: Access to outpatient mental health care, Supportive family, Supportive friends, and Cultural, spiritual, or religious beliefs that discourage  suicide  Thank you for this consult request. Recommendations have been communicated to the primary team.  We will sign off at this  time.   Raffi Milstein A Kline, NP       History of Present Illness  Relevant Aspects of Hospital ED Course:  Admitted on 01/06/2024 for brought in by self for pins and needles pain, difficulty sleeping, muscle twitching and difficulty swallowing. He carries the psychiatric diagnoses of GAD, MDD with psychotic features, cannabis use, remote HX of heroine overdose, benzodiazepine abuse in remission and paranoid disorder and has a past medical history of  HTN, hyperlipidemia, hepatitis C, cerebral atrophy and atherosclerosis of aorta.   Patient Report:   [name], is seen face to face by this provider, consulted with Dr. Larina; and chart reviewed on 01/07/24.  On evaluation [name] reports ongoing symptoms of muscle pain, weakness, tremors and spasms, poor memory and impaired cognition, neuropathy and insomnia. He describes intermittently feeling the sensation of electric shocks in his body.  He reports that when he has tried medications that have worked for him in the past, he is having intolerable reactions.  His reported symptoms describe benzodiazepine protracted withdrawal syndrome. The patient reports frustration at his perception that he is not being taken seriously.  He says it's like his brain chemistry has permanently changed or his brain is broken.    During evaluation [name] is sitting on a bed in mild distress.  He is alert & oriented x 4, anxious, cooperative and attentive for this assessment.  His mood is depressed with congruent affect.  He has normal speech, and behavior.  Objectively there is no evidence of psychosis/mania or delusional thinking. Pt does not appear to be responding to internal or external stimuli.  Patient is able to converse coherently, goal directed thoughts, no distractibility, or pre-occupation.  He denies suicidal/self-harm/homicidal ideation, psychosis, and paranoia.  Patient answered questions appropriately.    I personally spent a total of 75 minutes in the  care of the patient today including preparing to see the patient, getting/reviewing separately obtained history, performing a medically appropriate exam/evaluation, counseling and educating, referring and communicating with other health care professionals, documenting clinical information in the EHR, independently interpreting results, communicating results, and coordinating care.  Psych ROS:  Depression: endorses Anxiety:  endorses Mania (lifetime and current): denies Psychosis: (lifetime and current): denies  Collateral information:  Contacted Signe Ming, brother, at [phone] on 01/07/2024 and left a HIPAA compliant message.  Review of Systems  Respiratory:  Positive for shortness of breath.   Neurological:  Positive for tingling, weakness and headaches.  Psychiatric/Behavioral:  Positive for depression. The patient is nervous/anxious.   All other systems reviewed and are negative.    Psychiatric and Social History  Psychiatric History:  Information collected from patient   Prev Dx/Sx: GAD, MDD with psychotic features, cannabis use, remote HX of heroine overdose, benzodiazepine abuse in remission and paranoid disorder Current Psych Provider: none Home Meds (current): none Previous Med Trials: many Therapy: none  Prior Psych Hospitalization: yes  Prior Self Harm: yes Prior Violence: denies  Family Psych History: unknown Family Hx suicide: unknown  Social History:  Developmental Hx: WNL Educational Hx: GED Occupational Hx: Disability Legal Hx: none Living Situation: Lives alone Spiritual Hx: believes in God Access to weapons/lethal means: denies   Substance History Alcohol: denies current use  Tobacco: denies current use Illicit drugs: denies current use Prescription drug abuse: denies current use Rehab hx: endorses   Exam Findings  Physical Exam:  Vital Signs:  Temp:  [97.5 F (36.4 C)-98.3 F (36.8 C)] 97.5 F (36.4 C) (10/01 1007) Pulse Rate:  [83-106]  92 (10/01 1007) Resp:  [15-20] 16 (10/01 1007) BP: (138-155)/(98-112) 155/98 (10/01 1007) SpO2:  [93 %-100 %] 96 % (10/01 1007) Blood pressure (!) 155/98, pulse 92, temperature (!) 97.5 F (36.4 C), temperature source Oral, resp. rate 16, SpO2 96%. There is no height or weight on file to calculate BMI.  Physical Exam Vitals and nursing note reviewed.  Eyes:     Pupils: Pupils are equal, round, and reactive to light.  Pulmonary:     Comments: Appears to be short of breath Skin:    General: Skin is dry.  Neurological:     Mental Status: He is alert and oriented to person, place, and time.  Psychiatric:        Attention and Perception: Attention and perception normal.        Mood and Affect: Mood is depressed.        Speech: Speech normal.        Behavior: Behavior normal. Behavior is cooperative.        Thought Content: Thought content normal.     Mental Status Exam: General Appearance: Disheveled  Orientation:  Full (Time, Place, and Person)  Memory:  Immediate;   Fair Recent;   Fair Remote;   Fair  Concentration:  Concentration: Fair  Recall:  Fair  Attention  Fair  Eye Contact:  Good  Speech:  Clear and Coherent  Language:  Fair  Volume:  Normal  Mood: depressed  Affect:  Congruent  Thought Process:  Goal Directed  Thought Content:  Logical  Suicidal Thoughts:  No  Homicidal Thoughts:  No  Judgement:  Impaired  Insight:  Fair  Psychomotor Activity:  Restlessness  Akathisia:  No  Fund of Knowledge:  Fair      Assets:  Communication Skills Desire for Improvement Housing Leisure Time Social Support  Cognition:  Impaired,  Mild  ADL's:  Intact  AIMS (if indicated):        Other History   These have been pulled in through the EMR, reviewed, and updated if appropriate.  Family History:  The patient's family history includes Breast cancer in his maternal grandmother; COPD in his mother; Diabetes in his maternal grandfather; Heart disease in his father and  mother; Kidney disease in his mother; Lupus in his mother.  Medical History: Past Medical History:  Diagnosis Date   Alcohol abuse    Allergy    Bipolar disorder (HCC)    Chronic pain    lower back pain, that has progressed   Depression    Diverticulosis    Dyspnea    Hepatitis C    hx. ofstates he no longer has is clear.   History of recreational drug use    cocaine, marijuana, polysubstance quit 1987.   HLD (hyperlipidemia)    Hypertension    Pancreatitis 2015   Panic attack    Sleep apnea    no cpap used,still has machine(use rarely)    Surgical History: Past Surgical History:  Procedure Laterality Date   CARDIAC CATHETERIZATION  2003   No CAD   EUS N/A 06/23/2014   Procedure: UPPER ENDOSCOPIC ULTRASOUND (EUS) LINEAR;  Surgeon: Toribio SHAUNNA Cedar, MD;  Location: [facility];  Service: Endoscopy;  Laterality: N/A;   sinus surgery     TONSILLECTOMY       Medications:  No current facility-administered medications  for this encounter.  Current Outpatient Medications:    acetaminophen  (TYLENOL ) 325 MG tablet, Take 650 mg by mouth every 6 (six) hours as needed., Disp: , Rfl:    propranolol  (INDERAL ) 20 MG tablet, Take 1 tablet (20 mg total) by mouth 2 (two) times daily., Disp: 60 tablet, Rfl: 0  Allergies: Allergies  Allergen Reactions   Diazepam Other (See Comments)    Ringing in ears, diarrhea, CNS issues-multiple reactions  Neurotoxicity   Morphine  And Codeine Shortness Of Breath   Gabapentin  Other (See Comments)    Gets worse    Efrain DELENA Specking, NP

## 2024-01-07 NOTE — ED Notes (Signed)
 Patient to room 37. Patient ambulated to room.  Patient oriented to unit and room. Patient is anxious. Patient reassured.  Patient stated has incomes talking to him, and states has ear plugs at home to help.

## 2024-01-07 NOTE — ED Provider Notes (Signed)
  Physical Exam  BP (!) 101/56   Pulse 66   Temp 98 F (36.7 C) (Oral)   Resp 16   SpO2 99%   Physical Exam  Procedures  Procedures  ED Course / MDM    Medical Decision Making Amount and/or Complexity of Data Reviewed Labs: ordered.   Pt seen and evaluated by psychiatry today, now psychiatrically cleared for DC and outpatient follow-up.        Jerrol Agent, MD 01/07/24 [phone]

## 2024-01-19 ENCOUNTER — Other Ambulatory Visit: Payer: Self-pay

## 2024-01-19 ENCOUNTER — Emergency Department (HOSPITAL_COMMUNITY)
Admission: EM | Admit: 2024-01-19 | Discharge: 2024-01-19 | Disposition: A | Discharge status: Home / Self Care | Attending: Emergency Medicine | Admitting: Emergency Medicine

## 2024-01-19 DIAGNOSIS — M791 Myalgia, unspecified site: Secondary | ICD-10-CM | POA: Diagnosis present

## 2024-01-19 DIAGNOSIS — G894 Chronic pain syndrome: Secondary | ICD-10-CM | POA: Insufficient documentation

## 2024-01-19 LAB — COMPREHENSIVE METABOLIC PANEL WITH GFR
ALT: 29 U/L (ref 0–44)
AST: 27 U/L (ref 15–41)
Albumin: 4.5 g/dL (ref 3.5–5.0)
Alkaline Phosphatase: 77 U/L (ref 38–126)
Anion gap: 12 (ref 5–15)
BUN: 26 mg/dL — ABNORMAL HIGH (ref 8–23)
CO2: 22 mmol/L (ref 22–32)
Calcium: 10.3 mg/dL (ref 8.9–10.3)
Chloride: 106 mmol/L (ref 98–111)
Creatinine, Ser: 0.94 mg/dL (ref 0.61–1.24)
GFR, Estimated: >60 mL/min (ref >60)
Glucose, Bld: 123 mg/dL — ABNORMAL HIGH (ref 70–99)
Potassium: 4.1 mmol/L (ref 3.5–5.1)
Sodium: 139 mmol/L (ref 135–145)
Total Bilirubin: 0.7 mg/dL (ref 0.0–1.2)
Total Protein: 7.2 g/dL (ref 6.5–8.1)

## 2024-01-19 LAB — CBC WITH DIFFERENTIAL/PLATELET
Abs Immature Granulocytes: 0.03 K/uL (ref 0.00–0.07)
Basophils Absolute: 0.1 K/uL (ref 0.0–0.1)
Basophils Relative: 1 %
Eosinophils Absolute: 0.1 K/uL (ref 0.0–0.5)
Eosinophils Relative: 1 %
HCT: 47.7 % (ref 39.0–52.0)
Hemoglobin: 15.5 g/dL (ref 13.0–17.0)
Immature Granulocytes: 0 %
Lymphocytes Relative: 30 %
Lymphs Abs: 2.9 K/uL (ref 0.7–4.0)
MCH: 26.6 pg (ref 26.0–34.0)
MCHC: 32.5 g/dL (ref 30.0–36.0)
MCV: 82 fL (ref 80.0–100.0)
Monocytes Absolute: 1 K/uL (ref 0.1–1.0)
Monocytes Relative: 10 %
Neutro Abs: 5.7 K/uL (ref 1.7–7.7)
Neutrophils Relative %: 58 %
Platelets: 243 K/uL (ref 150–400)
RBC: 5.82 MIL/uL — ABNORMAL HIGH (ref 4.22–5.81)
RDW: 13.7 % (ref 11.5–15.5)
WBC: 9.8 K/uL (ref 4.0–10.5)
nRBC: 0 % (ref 0.0–0.2)

## 2024-01-19 NOTE — ED Notes (Signed)
 Writer attempted to go over discharge paperwork with pt. Pt started yelling at writer saying don't read that and tell me a lie. Writer advised discharge paperwork had to be reviewed.

## 2024-01-19 NOTE — Discharge Instructions (Addendum)
 Please follow-up with neurology for further outpatient testing and management.  Follow up with a primary care for reevaluation. If you experience severe chest pain, shortness of breath or other concerning symptoms please return to ED for further evaluation.

## 2024-01-19 NOTE — ED Triage Notes (Signed)
 PT BIB EMS coming from home c/o generalized pain all over. Believes that he has neurologic toxicity damage and would like SNIFF test. States that no medication will relief his pain. Refuses to have any type of benzo, as this makes it worst. Hx: of Bipolar and etoh use. [facility] patient. Non compliant with current medication.   EMS: 152/92, HR 116, spo2 93% RA,

## 2024-01-19 NOTE — ED Provider Notes (Signed)
 [facility] Provider Note   CSN: [id number] Arrival date & time: 01/19/24  9387     Patient presents with: Pain   [name] is a 68 y.o. male. 68 year old male presents to ED with complaints of chronic pain.  The patient does not describe the pain as all over his entire body due to neurotoxicity.  Patient has been seen for same several times in the ED without relief.  Patient advises he was on benzodiazepines for 48 years.  After being taken off he did Librium and advised this is when all the pain started.  Patient advises it gets worse over time and has been referred to neurology in the past without following up with them     Prior to Admission medications   Medication Sig Start Date End Date Taking? Authorizing Provider  acetaminophen  (TYLENOL ) 325 MG tablet Take 650 mg by mouth every 6 (six) hours as needed.    [provider]  propranolol  (INDERAL ) 20 MG tablet Take 1 tablet (20 mg total) by mouth 2 (two) times daily. 05/21/23   Donnelly Mellow, MD    Allergies: Diazepam, Morphine  and codeine, and Gabapentin     Review of Systems  Constitutional:  Positive for activity change.  All other systems reviewed and are negative.   Updated Vital Signs BP (!) 148/127   Pulse (!) 119   Temp 98.8 F (37.1 C) (Oral)   Resp 20   Ht 5' 7 (1.702 m)   SpO2 95%   BMI 24.83 kg/m   Physical Exam Vitals and nursing note reviewed.  Constitutional:      Appearance: Normal appearance.  HENT:     Head: Normocephalic and atraumatic.     Nose: Nose normal.  Eyes:     Extraocular Movements: Extraocular movements intact.     Conjunctiva/sclera: Conjunctivae normal.     Pupils: Pupils are equal, round, and reactive to light.  Cardiovascular:     Rate and Rhythm: Normal rate.  Pulmonary:     Effort: Pulmonary effort is normal. No respiratory distress.     Breath sounds: Normal breath sounds.  Musculoskeletal:        General: No  swelling or tenderness. Normal range of motion.     Cervical back: Normal range of motion.  Skin:    General: Skin is warm.     Capillary Refill: Capillary refill takes less than 2 seconds.     Coloration: Pallor: .kf.  Neurological:     General: No focal deficit present.     Mental Status: He is alert.  Psychiatric:        Mood and Affect: Mood normal.        Behavior: Behavior normal.     (all labs ordered are listed, but only abnormal results are displayed) Labs Reviewed  CBC WITH DIFFERENTIAL/PLATELET - Abnormal; Notable for the following components:      Result Value   RBC 5.82 (*)    All other components within normal limits  COMPREHENSIVE METABOLIC PANEL WITH GFR - Abnormal; Notable for the following components:   Glucose, Bld 123 (*)    BUN 26 (*)    All other components within normal limits    EKG: None  Radiology: No results found.   Procedures   Medications Ordered in the ED - No data to display  68 y.o. male presents to the ED with complaints of pain all over chronic in nature due to reported neurotoxicity, this involves  an extensive number of treatment options, and is a complaint that carries with it a high risk of complications and morbidity.  The differential diagnosis includes anxiety, chronic pain syndrome, neurotoxicity, neurologic pain (Ddx)  On arrival pt is nontoxic, vitals significant for tachycardia. Exam significant for patient acting very anxious and talking manically.  Patient is reporting pain to his entire body for several years now.  Additional history obtained from chart review patient denies home care visit for anxiety.  ED Course:   68 year old male presents ED with complaints of chronic pain all over from reported neurotoxicity today appears ago coming off of benzodiazepines.  Patient has been seen multiple times in the ED for same complaint.  Patient has been seen by psychiatry and there were a workup as well.  Patient reports no one  ever found anything wrong and no medication to help.  Patient reports she has tried several different pain management including gabapentin  and opioids.  Patient reports he has not followed up with a neurologist after neuro referral.  Patient is sitting very anxiously in ED room and [facility] speaking.  Patient was advised to follow-up with neurology for outpatient workup and he reported he does not have a ride.  It was advised the patient to reach out to different resources for a ride to his appointments.  Patient reported he would follow-up with neurology at this time for further workup.  Patient is refusing any pain management or medication at this time.  Patient was given strict return precautions and advised he felt comfortable with treatment plan and follow-up with his neurology referral.  Portions of this note were generated with Dragon dictation software. Dictation errors may occur despite best attempts at proofreading.   Final diagnoses:  Chronic pain syndrome    ED Discharge Orders          Ordered    Ambulatory referral to Neurology       Comments: An appointment is requested in approximately: 1 week   01/19/24 0731               Myriam Fonda RAMAN, PA-C 01/19/24 1546    Dasie Faden, MD 01/26/24 1550

## 2024-01-23 ENCOUNTER — Ambulatory Visit: Payer: Self-pay | Admitting: *Deleted

## 2024-01-23 NOTE — Telephone Encounter (Signed)
 FYI Only or Action Required?: Action required by provider: request for appointment.  Patient was last seen in primary care on 05/29/2023 by Knute Thersia Bitters, FNP.  Called Nurse Triage reporting Blurred Vision.  Symptoms began several months ago.  Interventions attempted: Other: multiple symptoms.  Symptoms are: gradually worsening.  Triage Disposition: See HCP Within 4 Hours (Or PCP Triage)  Patient/caregiver understands and will follow disposition?: declines advised- UC/ED  Patient is calling- sounds like he is struggling with speaking- patient states be believes he has medication toxicity and he is in the final stages. Patient is requesting to be seen by Dr Everitt [place] since he feels he is the only provider who has listened to him and can help him. Patient advised UC/ED but he will not go- he does have Child psychotherapist coming today to see him and hopes she can transport him to appointments. I am not sure he is current patient at [facility]

## 2024-01-23 NOTE — Telephone Encounter (Signed)
 Copied from CRM [id number]. Topic: Clinical - Red Word Triage >> Jan 23, 2024  8:47 AM Anairis L wrote: [facility] that prompted transfer to Nurse Triage: Anxiety/Blurred vision. Can not sleep. Reason for Disposition  [1] Longstanding difficulty breathing (e.g., CHF, COPD, emphysema) AND [2] WORSE than normal  Answer Assessment - Initial Assessment Questions 1. RESPIRATORY STATUS: Describe your breathing? (e.g., wheezing, shortness of breath, unable to speak, severe coughing)      SOB- gasping for air 2. ONSET: When did this breathing problem begin?      2 years 3. PATTERN Does the difficult breathing come and go, or has it been constant since it started?      constant 4. SEVERITY: How bad is your breathing? (e.g., mild, moderate, severe)      Patient feels like he can't breath- get air 5. RECURRENT SYMPTOM: Have you had difficulty breathing before? If Yes, ask: When was the last time? and What happened that time?      Ongoing- patient feels suffocating 6. CARDIAC HISTORY: Do you have any history of heart disease? (e.g., heart attack, angina, bypass surgery, angioplasty)      HLD, hypertension  8. CAUSE: What do you think is causing the breathing problem?      unsure 9. OTHER SYMPTOMS: Do you have any other symptoms? (e.g., chest pain, cough, dizziness, fever, runny nose)     Blurred vision 10. O2 SATURATION MONITOR:  Do you use an oxygen saturation monitor (pulse oximeter) at home? If Yes, ask: What is your reading (oxygen level) today? What is your usual oxygen saturation reading? (e.g., 95%)       *No Answer* 11. PREGNANCY: Is there any chance you are pregnant? When was your last menstrual period?       *No Answer* 12. TRAVEL: Have you traveled out of the country in the last month? (e.g., travel history, exposures)       *No Answer*  Answer Assessment - Initial Assessment Questions 1. DESCRIPTION: How has your vision changed? (e.g., complete vision  loss, blurred vision, double vision, floaters, etc.)     Blurred vision  4. ONSET: When did this begin? Did it start suddenly or has this been gradual?     chronic  8. CAUSE: What do you think is causing this visual problem?     Patient thinks he has neurotoxicity  9. OTHER SYMPTOMS: Do you have any other symptoms? (e.g., confusion, headache, arm or leg weakness, speech problems)     Hands blue, feet red  Protocols used: Vision Loss or Change-A-AH, Breathing Difficulty-A-AH

## 2024-01-26 ENCOUNTER — Other Ambulatory Visit: Payer: Self-pay

## 2024-01-26 ENCOUNTER — Encounter (HOSPITAL_COMMUNITY): Payer: Self-pay

## 2024-01-26 ENCOUNTER — Emergency Department (HOSPITAL_COMMUNITY)
Admission: EM | Admit: 2024-01-26 | Discharge: 2024-01-26 | Discharge status: Against Medical Advice | Attending: Emergency Medicine | Admitting: Emergency Medicine

## 2024-01-26 ENCOUNTER — Ambulatory Visit (HOSPITAL_BASED_OUTPATIENT_CLINIC_OR_DEPARTMENT_OTHER): Admitting: Family Medicine

## 2024-01-26 ENCOUNTER — Emergency Department (HOSPITAL_COMMUNITY)

## 2024-01-26 DIAGNOSIS — R0602 Shortness of breath: Secondary | ICD-10-CM | POA: Insufficient documentation

## 2024-01-26 DIAGNOSIS — Z5321 Procedure and treatment not carried out due to patient leaving prior to being seen by health care provider: Secondary | ICD-10-CM | POA: Diagnosis not present

## 2024-01-26 LAB — CBC
HCT: 49.8 % (ref 39.0–52.0)
Hemoglobin: 15.8 g/dL (ref 13.0–17.0)
MCH: 26 pg (ref 26.0–34.0)
MCHC: 31.7 g/dL (ref 30.0–36.0)
MCV: 81.9 fL (ref 80.0–100.0)
Platelets: 257 K/uL (ref 150–400)
RBC: 6.08 MIL/uL — ABNORMAL HIGH (ref 4.22–5.81)
RDW: 13.9 % (ref 11.5–15.5)
WBC: 8.4 K/uL (ref 4.0–10.5)
nRBC: 0 % (ref 0.0–0.2)

## 2024-01-26 LAB — BASIC METABOLIC PANEL WITH GFR
Anion gap: 14 (ref 5–15)
BUN: 23 mg/dL (ref 8–23)
CO2: 20 mmol/L — ABNORMAL LOW (ref 22–32)
Calcium: 9.8 mg/dL (ref 8.9–10.3)
Chloride: 106 mmol/L (ref 98–111)
Creatinine, Ser: 0.98 mg/dL (ref 0.61–1.24)
GFR, Estimated: >60 mL/min (ref >60)
Glucose, Bld: 123 mg/dL — ABNORMAL HIGH (ref 70–99)
Potassium: 3.8 mmol/L (ref 3.5–5.1)
Sodium: 140 mmol/L (ref 135–145)

## 2024-01-26 NOTE — ED Notes (Signed)
 Pt refused chest xray, would like a PET scan.

## 2024-01-26 NOTE — ED Provider Triage Note (Signed)
 Emergency Medicine Provider Triage Evaluation Note  [name] , a 68 y.o. male  was evaluated in triage.  Pt complains of multiple complaints. Patient describes multiple complaints and is unable to specify why he has come to the ED today. 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 Reports shortness of breath all the time and difficulty swallowing. States if they are not going to do anything different today just let me go home so I can meet with hospice because I am going to die soon.  Review of Systems  Positive: As above Negative: As above  Physical Exam  BP (!) 144/86 (BP Location: Left Arm)   Pulse 87   Temp 98.3 F (36.8 C) (Oral)   Resp 20   SpO2 99%  Gen:   Awake Resp:  Speaks in full/clear sentences but audibly grunts while breathing, lung sounds are clear to auscultation MSK:   Moves extremities without difficulty  Other:   Medical Decision Making  Medically screening exam initiated at 1:22 PM.  Appropriate orders placed.  [name] was informed that the remainder of the evaluation will be completed by another provider, this initial triage assessment does not replace that evaluation, and the importance of remaining in the ED until their evaluation is complete.     Glendia Rocky SAILOR, [place] 01/26/24 1325

## 2024-01-26 NOTE — ED Triage Notes (Signed)
 Pt BIB EMS from home with reports of SHOB x 2 years.

## 2024-01-27 ENCOUNTER — Telehealth (HOSPITAL_BASED_OUTPATIENT_CLINIC_OR_DEPARTMENT_OTHER): Payer: Self-pay

## 2024-01-27 NOTE — Telephone Encounter (Signed)
 Routing to Dr Tommi Rumps [place] for review.

## 2024-01-27 NOTE — Telephone Encounter (Signed)
 When calling patient for a reminder call about his appointment tomorrow he told me he doesn't think he can make it in to his appointment tomorrow but would like for Dr. Penne to call him about getting Hospice out to his house. Patient states he doesn't even think he will be alive tomorrow and he's sorry for all the trouble he's caused. He states Dr. Penne is the nicest guy and would appreciate if he can help him.  Patient states his brother is trying to help him as well.  Please advise.

## 2024-01-28 ENCOUNTER — Other Ambulatory Visit: Payer: Self-pay

## 2024-01-28 ENCOUNTER — Encounter (HOSPITAL_COMMUNITY): Payer: Self-pay | Admitting: Radiology

## 2024-01-28 ENCOUNTER — Ambulatory Visit (HOSPITAL_BASED_OUTPATIENT_CLINIC_OR_DEPARTMENT_OTHER): Admitting: Family Medicine

## 2024-01-28 ENCOUNTER — Ambulatory Visit: Payer: Self-pay

## 2024-01-28 ENCOUNTER — Emergency Department (HOSPITAL_COMMUNITY)

## 2024-01-28 ENCOUNTER — Emergency Department (HOSPITAL_COMMUNITY)
Admission: EM | Admit: 2024-01-28 | Discharge: 2024-01-29 | Disposition: A | Discharge status: Home / Self Care | Attending: Emergency Medicine | Admitting: Emergency Medicine

## 2024-01-28 DIAGNOSIS — G894 Chronic pain syndrome: Secondary | ICD-10-CM | POA: Insufficient documentation

## 2024-01-28 DIAGNOSIS — F419 Anxiety disorder, unspecified: Secondary | ICD-10-CM | POA: Insufficient documentation

## 2024-01-28 DIAGNOSIS — Z79899 Other long term (current) drug therapy: Secondary | ICD-10-CM | POA: Insufficient documentation

## 2024-01-28 DIAGNOSIS — R0602 Shortness of breath: Secondary | ICD-10-CM | POA: Insufficient documentation

## 2024-01-28 DIAGNOSIS — I10 Essential (primary) hypertension: Secondary | ICD-10-CM | POA: Diagnosis not present

## 2024-01-28 DIAGNOSIS — R4189 Other symptoms and signs involving cognitive functions and awareness: Secondary | ICD-10-CM | POA: Insufficient documentation

## 2024-01-28 LAB — COMPREHENSIVE METABOLIC PANEL WITH GFR
ALT: 34 U/L (ref 0–44)
AST: 28 U/L (ref 15–41)
Albumin: 4.5 g/dL (ref 3.5–5.0)
Alkaline Phosphatase: 66 U/L (ref 38–126)
Anion gap: 13 (ref 5–15)
BUN: 20 mg/dL (ref 8–23)
CO2: 21 mmol/L — ABNORMAL LOW (ref 22–32)
Calcium: 9.4 mg/dL (ref 8.9–10.3)
Chloride: 107 mmol/L (ref 98–111)
Creatinine, Ser: 1.07 mg/dL (ref 0.61–1.24)
GFR, Estimated: >60 mL/min (ref >60)
Glucose, Bld: 105 mg/dL — ABNORMAL HIGH (ref 70–99)
Potassium: 4.1 mmol/L (ref 3.5–5.1)
Sodium: 141 mmol/L (ref 135–145)
Total Bilirubin: 1.2 mg/dL (ref 0.0–1.2)
Total Protein: 7.6 g/dL (ref 6.5–8.1)

## 2024-01-28 LAB — CBC WITH DIFFERENTIAL/PLATELET
Abs Immature Granulocytes: 0.02 K/uL (ref 0.00–0.07)
Basophils Absolute: 0.1 K/uL (ref 0.0–0.1)
Basophils Relative: 1 %
Eosinophils Absolute: 0.1 K/uL (ref 0.0–0.5)
Eosinophils Relative: 1 %
HCT: 49.8 % (ref 39.0–52.0)
Hemoglobin: 16.4 g/dL (ref 13.0–17.0)
Immature Granulocytes: 0 %
Lymphocytes Relative: 27 %
Lymphs Abs: 2.1 K/uL (ref 0.7–4.0)
MCH: 26.9 pg (ref 26.0–34.0)
MCHC: 32.9 g/dL (ref 30.0–36.0)
MCV: 81.6 fL (ref 80.0–100.0)
Monocytes Absolute: 0.8 K/uL (ref 0.1–1.0)
Monocytes Relative: 11 %
Neutro Abs: 4.7 K/uL (ref 1.7–7.7)
Neutrophils Relative %: 60 %
Platelets: 290 K/uL (ref 150–400)
RBC: 6.1 MIL/uL — ABNORMAL HIGH (ref 4.22–5.81)
RDW: 13.6 % (ref 11.5–15.5)
WBC: 7.7 K/uL (ref 4.0–10.5)
nRBC: 0 % (ref 0.0–0.2)

## 2024-01-28 LAB — TROPONIN I (HIGH SENSITIVITY)
Troponin I (High Sensitivity): 5 ng/L (ref <18)
Troponin I (High Sensitivity): 6 ng/L (ref <18)

## 2024-01-28 NOTE — ED Triage Notes (Signed)
 PT with multiple complaints. Endorses this has been going on for 2 years and 2 months but is now getting worse.  Pain all over with pins and needles. Feels he still can't breath but he never looses consciousness. HE has a paper with him with multiple complaints.

## 2024-01-28 NOTE — Telephone Encounter (Signed)
 Pt's appt ended up needing to be cancelled as provider was out of the office.

## 2024-01-28 NOTE — ED Provider Triage Note (Signed)
 Emergency Medicine Provider Triage Evaluation Note  Denvil Canning , a 68 y.o. male  was evaluated in triage.  Pt complains of shortness of breath, chest pain, and difficulty drinking water.  Review of Systems  Positive: As above Negative: As above  Physical Exam  BP (!) 151/116 (BP Location: Right Arm)   Pulse (!) 113   Temp 97.8 F (36.6 C) (Oral)   Resp (!) 26   Ht 5' 7 (1.702 m)   Wt 65.8 kg   SpO2 93%   BMI 22.71 kg/m  Gen:   Awake, no distress   Resp:  Normal effort  MSK:   Moves extremities without difficulty  Other:    Medical Decision Making  Medically screening exam initiated at 6:43 PM.  Appropriate orders placed.  [name] was informed that the remainder of the evaluation will be completed by another provider, this initial triage assessment does not replace that evaluation, and the importance of remaining in the ED until their evaluation is complete.     Hildegard Loge, PA-C 01/28/24 1843

## 2024-01-28 NOTE — Telephone Encounter (Signed)
 FYI Only or Action Required?: FYI only for provider.  Patient was last seen in primary care on 05/29/2023 by Knute Thersia Bitters, FNP.  Called Nurse Triage reporting Dysphagia.  Symptoms began chronic problem for patient.  Symptoms are: worsening.  Triage Disposition: Go to ED Now (or PCP Triage)  Patient/caregiver understands and will follow disposition?: Yes      Copied from CRM [id number]. Topic: Clinical - Red Word Triage >> Jan 28, 2024  4:33 PM Everette C wrote: [facility] that prompted transfer to Nurse Triage: The patient has called to share that they are currently experiencing difficulty swallowing that is causing them concern       Reason for Disposition  Patient sounds very sick or weak to the triager  Answer Assessment - Initial Assessment Questions 1. DESCRIPTION: Tell me more about this problem. Are you  having trouble swallowing liquids, solids, or both? Any trouble with swallowing saliva (spit)?     Difficulty swallowing  2. SEVERITY: How bad is the swallowing difficulty?  (Scale 1-10; or mild, moderate, severe)     Moderate  3. ONSET: When did the swallowing problems begin?      Has been a problem for 5 years  4. CAUSE: What do you think is causing the problem?  (e.g., dry mouth, food or pill stuck in throat, mouth pain, sore throat, progression of disease process such as dementia or Parkinson's disease).      Chronic issue  5. CHRONIC or RECURRENT: Is this a new problem for you?  If No, ask: How long have you had this problem? (e.g., days, weeks, months)      Chronic  6. OTHER SYMPTOMS: Do you have any other symptoms? (e.g., chest pain, difficulty breathing, mouth sores, sore throat, swollen tongue, chest pain)     Increased anxiety, I'm breathing faster than normal  Protocols used: Swallowing Difficulty-A-AH

## 2024-01-29 DIAGNOSIS — R0602 Shortness of breath: Secondary | ICD-10-CM | POA: Diagnosis not present

## 2024-01-29 LAB — RAPID URINE DRUG SCREEN, HOSP PERFORMED
Amphetamines: NOT DETECTED
Barbiturates: NOT DETECTED
Benzodiazepines: NOT DETECTED
Cocaine: NOT DETECTED
Opiates: NOT DETECTED
Tetrahydrocannabinol: NOT DETECTED

## 2024-01-29 LAB — D-DIMER, QUANTITATIVE: D-Dimer, Quant: <0.27 ug{FEU}/mL (ref 0.00–0.50)

## 2024-01-29 MED ORDER — SODIUM CHLORIDE 0.9 % IV BOLUS
1000.0000 mL | Freq: Once | INTRAVENOUS | Status: AC
  Administered 2024-01-29: 1000 mL via INTRAVENOUS

## 2024-01-29 NOTE — Discharge Instructions (Addendum)
 Please follow-up with neurology.  I placed an ambulatory referral.  They will be calling you to make an appointment.  If you feel the need to have mental health evaluation, please go to the behavioral urgent care.  Please continue taking all medications as prescribed.  Return to the ED with symptoms.

## 2024-01-29 NOTE — ED Provider Notes (Signed)
 [place] EMERGENCY DEPARTMENT AT [facility] Provider Note   CSN: [id number] Arrival date & time: 01/28/24  1813     Patient presents with: Shortness of Breath and multiple complaints   [name] is a 68 y.o. male with history of hypertension, alcohol abuse, bipolar disorder, chronic pain syndrome, dyspnea, Padonda C, pretension, panic attacks, sleep apnea.  Patient presents to ED complaining of pain all over, requesting hospice diagnosis and admission.  Patient states that ever since being weaned off of benzodiazepines which he was on for 48 years he has had neurotoxicity.  He has been seen numerous times for this.  He was recently referred to neurology but he states that he called neurologist office who advised him that we will not do anything for you and he did not follow-up.  He is here requesting that I figure this out so that he can receive hospice diagnosis and care.  When asked what exactly he wishes that I figure out he reports that he wants me to determine what all is going on.  He cannot expand further on this.  He states that he feels as if he will be dying in the next 30 days but when asked to explain why he feels this way he cannot.  He is unable to give me a specific complaint that brings him to the ER.  When asked what brought him in, he states pain all over my entire body that has been going on for 1 year.  He reports that he feels as if he developed neurotoxicity 1 year ago.  He is also complaining of blueness to his hands which has been ongoing for the last 1 month, redness to his bilateral feet.  When asked if he feels as if his redness to his feet is due to infection, he states no, it is neurologic.  He states that he has been unable to eat or sleep or drink over the last few weeks.  When asked how he is providing himself sustenance, he states that he is drinking boost and Ensure drinks.  He denies abdominal pain, nausea or vomiting.  He states that he has  issues swallowing and this is documented in his chart with a diagnosis of dysphagia.  He denies feeling as if he cannot swallow.  He does have a cough but states his cough is chronic.  He reports he does not wish to take medications as medications will worsen his neurotoxicity.  He is alert and oriented.  He denies SI, HI, AVH.  He is not responding to internal stimuli.   Shortness of Breath      Prior to Admission medications   Medication Sig Start Date End Date Taking? Authorizing Provider  acetaminophen  (TYLENOL ) 325 MG tablet Take 650 mg by mouth every 6 (six) hours as needed.    [provider]  propranolol  (INDERAL ) 20 MG tablet Take 1 tablet (20 mg total) by mouth 2 (two) times daily. 05/21/23   Donnelly Mellow, MD    Allergies: Diazepam, Morphine  and codeine, and Gabapentin     Review of Systems  Respiratory:  Positive for shortness of breath.   Musculoskeletal:  Positive for myalgias.  All other systems reviewed and are negative.   Updated Vital Signs BP (!) 148/101   Pulse 88   Temp 98.6 F (37 C) (Oral)   Resp 14   Ht 5' 7 (1.702 m)   Wt 65.8 kg   SpO2 96%   BMI 22.71 kg/m  Physical Exam Vitals and nursing note reviewed.  Constitutional:      General: He is not in acute distress.    Appearance: He is well-developed.  HENT:     Head: Normocephalic and atraumatic.     Comments: No meningismus, no tenderness to cervical spine Eyes:     Conjunctiva/sclera: Conjunctivae normal.  Cardiovascular:     Rate and Rhythm: Regular rhythm. Tachycardia present.     Pulses:          Radial pulses are 2+ on the right side and 2+ on the left side.       Dorsalis pedis pulses are 2+ on the right side and 2+ on the left side.     Heart sounds: No murmur heard. Pulmonary:     Effort: Pulmonary effort is normal. No respiratory distress.     Breath sounds: Normal breath sounds.  Abdominal:     Palpations: Abdomen is soft.     Tenderness: There is no abdominal  tenderness.     Comments: Abdomen soft, compressible.  No tenderness noted.  No overlying skin change.  No CVA tenderness.  Musculoskeletal:        General: No swelling.     Cervical back: Neck supple.     Right lower leg: No edema.     Left lower leg: No edema.     Comments: Full ROM of bilateral upper and lower extremities.  Skin:    General: Skin is warm and dry.     Capillary Refill: Capillary refill takes less than 2 seconds.  Neurological:     General: No focal deficit present.     Mental Status: He is alert and oriented to person, place, and time. Mental status is at baseline.     GCS: GCS eye subscore is 4. GCS verbal subscore is 5. GCS motor subscore is 6.     Cranial Nerves: Cranial nerves 2-12 are intact. No cranial nerve deficit.     Sensory: Sensation is intact. No sensory deficit.     Motor: Motor function is intact. No weakness.     Coordination: Coordination is intact. Heel to [facility] Test normal.     Comments: CN III - XII intact.  Intact finger-to-nose, heel-to-shin.  No pronator drift.  No slurred speech.  Equal strength throughout.  Equal sensation throughout.  PERRL.  Tracks cross midline.  Alert and oriented x 4.  Psychiatric:        Mood and Affect: Mood normal.     Comments: Denies SI, HI, AVH.  Not responding to internal stimuli.     (all labs ordered are listed, but only abnormal results are displayed) Labs Reviewed  CBC WITH DIFFERENTIAL/PLATELET - Abnormal; Notable for the following components:      Result Value   RBC 6.10 (*)    All other components within normal limits  COMPREHENSIVE METABOLIC PANEL WITH GFR - Abnormal; Notable for the following components:   CO2 21 (*)    Glucose, Bld 105 (*)    All other components within normal limits  D-DIMER, QUANTITATIVE  RAPID URINE DRUG SCREEN, HOSP PERFORMED  TROPONIN I (HIGH SENSITIVITY)  TROPONIN I (HIGH SENSITIVITY)    EKG: EKG Interpretation Date/Time:  Thursday January 29 2024 01:12:51  EDT Ventricular Rate:  112 PR Interval:  154 QRS Duration:  84 QT Interval:  332 QTC Calculation: 454 R Axis:   161  Text Interpretation: Sinus tachycardia Prominent P waves, nondiagnostic Left posterior fascicular block Abnormal R-wave progression, late  transition No significant change since last tracing Confirmed by Midge Golas ([id number]) on 01/29/2024 1:15:20 AM  Radiology: DG Chest 1 View Result Date: 01/28/2024 EXAM: 1 VIEW(S) XRAY OF THE CHEST 01/28/2024 07:24:05 PM COMPARISON: 10/08/2023 CLINICAL HISTORY: chest pain and SOB, states he feels like he has a paralyzed lung; PT with multiple complaints. Endorses this has been going on for 2 years and 2 months but is now getting worse. Pain all over with pins and needles. Feels he still can't breath but he never looses consciousness. HE has a paper with him with multiple complaints. FINDINGS: LUNGS AND PLEURA: No focal pulmonary opacity. No pulmonary edema. No pleural effusion. No pneumothorax. HEART AND MEDIASTINUM: No acute abnormality of the cardiac and mediastinal silhouettes. BONES AND SOFT TISSUES: No acute osseous abnormality. IMPRESSION: 1. No acute cardiopulmonary process. Electronically signed by: Norman Gatlin MD 01/28/2024 07:28 PM EDT RP Workstation: [id number]    Procedures   Medications Ordered in the ED  sodium chloride  0.9 % bolus 1,000 mL (0 mLs Intravenous Stopped 01/29/24 0209)      Medical Decision Making Amount and/or Complexity of Data Reviewed Labs: ordered.   This is a 68 year old male presenting to the ED due to concerns of multiple complaints.  Primarily shortness of breath.  Please see HPI.  On exam, the patient is afebrile, tachycardic.  Lung sounds are clear bilaterally, no hypoxia.  Abdomen soft and compressible.  Neuroexam at baseline.  No edema to bilateral lower extremities.  Denies SI, HI, AVH.  Not responding to internal stimuli.  Patient presents with multiple complaints.  He is here requesting  a formal diagnosis in order to obtain hospice care.  He is also concerned that he has discoloration to his hands, reports they are blue.  Also complaining of redness to his feet.  He has 2+ DP pulse in the bilateral feet with brisk cap refill.  Has 2+ radial pulse in bilateral upper extremities with brisk refill.  He is neurovascularly intact.  There are no signs of cellulitis to bilateral feet, no erythema.  When asked if the patient is concerned that he has infection of his feet, he reports that no this is neurologic.  No obvious discoloration to hands. Defers on wishing to receive antibiotics.  Patient was worked up utilizing CBC, CMP, troponin, D-dimer, UDS, chest x-ray and EKG.  CBC here is without leukocytosis or anemia.  His metabolic panel is grossly unremarkable without electrolyte derangement, elevated LFTs, and elevated anion gap.  His troponins are flat at 5 and 6.  His D-dimer is negative.  His UDS is negative for all.  His chest x-ray shows no cardiopulmonary disease.  His EKG is nonischemic.  Patient pulse rate normalized with liter of fluid to 93.  He denies SI, HI, AVH.  He is not responding to internal stimuli.  He was offered multiple medications to assist with his symptoms however he reports that all medications given to him just worsen how he feels.  I had a shared decision-making conversation with the patient and I asked him what exactly I could assist him with tonight.  He reports that he wishes to receive a formal diagnosis that will help him obtain hospice.  I advised him that I cannot do this this evening.  I advised him that his workup in terms of his shortness of breath is reassuring.  Patient was advised to follow-up outpatient with neurology which I have again referred him to.  He was also advised to follow-up with the  behavioral health urgent care for further care of his mental health if he feels the need to do so.  He voiced understanding.  He all of his questions answered to  his satisfaction.  He is stable to discharge.    Final diagnoses:  Shortness of breath  Anxiety  Chronic pain syndrome  Cognitive changes    ED Discharge Orders          Ordered    Ambulatory referral to Neurology       Comments: An appointment is requested in approximately: 2 weeks   01/29/24 0311               Ruthell Lonni FALCON, PA-C 01/29/24 9688    Midge Golas, MD 01/29/24 [phone]

## 2024-02-26 ENCOUNTER — Ambulatory Visit (HOSPITAL_BASED_OUTPATIENT_CLINIC_OR_DEPARTMENT_OTHER): Admitting: Family Medicine

## 2024-03-24 ENCOUNTER — Ambulatory Visit: Payer: Self-pay

## 2024-03-24 DIAGNOSIS — R4189 Other symptoms and signs involving cognitive functions and awareness: Secondary | ICD-10-CM

## 2024-03-24 DIAGNOSIS — R52 Pain, unspecified: Secondary | ICD-10-CM

## 2024-03-24 DIAGNOSIS — F411 Generalized anxiety disorder: Secondary | ICD-10-CM

## 2024-03-24 DIAGNOSIS — R1310 Dysphagia, unspecified: Secondary | ICD-10-CM

## 2024-03-24 NOTE — Telephone Encounter (Signed)
 FYI Only or Action Required?: FYI only for provider: ED advised. Refuses disposition, requesting hospice referral.   Patient was last seen in primary care on 05/29/2023 by Knute Thersia Bitters, FNP.  Called Nurse Triage reporting Dysphagia.  Symptoms began several years ago.  Interventions attempted: Nothing.  Symptoms are: gradually worsening.  Triage Disposition: Go to ED Now (or PCP Triage)  Patient/caregiver understands and will follow disposition?: No, refuses disposition  Reason for Disposition  [1] Drinking very little AND [2] dehydration suspected (e.g., no urine > 12 hours, very dry mouth, very lightheaded)  Answer Assessment - Initial Assessment Questions Patient states that he has had difficulty swallowing for a few years that has worsened over time. He reports mild difficulty breathing, but denies any other symptoms. He continuously clears throat during triage call. He states that he has a neurological disorder that causes this and there's nothing that can be done for it. He has visited ED and UC for this problem multiple times with no relief. Advised to go to ED, but refuses. Patient is requesting hospice referral.   1. DESCRIPTION: Tell me more about this problem. Are you  having trouble swallowing liquids, solids, or both? Any trouble with swallowing saliva (spit)?     Trouble swallowing food and liquids  2. SEVERITY: How bad is the swallowing difficulty?  (Scale 1-10; or mild, moderate, severe)     Moderate  3. ONSET: When did the swallowing problems begin?      A few years ago  4. CAUSE: What do you think is causing the problem?  (e.g., dry mouth, food or pill stuck in throat, mouth pain, sore throat, progression of disease process such as dementia or Parkinson's disease).      Patient states it is neurological issue  5. CHRONIC or RECURRENT: Is this a new problem for you?  If No, ask: How long have you had this problem? (e.g., days, weeks, months)       Chronic  6. OTHER SYMPTOMS: Do you have any other symptoms? (e.g., chest pain, difficulty breathing, mouth sores, sore throat, swollen tongue, chest pain)     Mild difficulty breathing, denies any other symptoms  7. PREGNANCY: Is there any chance you are pregnant? When was your last menstrual period?     NA  Protocols used: Swallowing Difficulty-A-AH  Copied from CRM #[id number]. Topic: Clinical - Red Word Triage >> Mar 24, 2024  4:59 PM Winona R wrote: Pt having a hard time breathing and swallowing and  is unestablished but calling to find out if a doctor can send him a referral for hospice. Ive tried to get him an appointment but he said he cant make it into the office which is why his last appointment was canceled. Hes been having this problem for quite some time and has been see in the ER for this several times. But the Pt realllyy doesn't sound good on the line. He can barely complete a sentence without clearing his throat or taking a breathe

## 2024-03-25 NOTE — Telephone Encounter (Signed)
 Patient is requesting a referral for hospice evaluation. Patient refuses to go to ED or Neurology.

## 2024-04-20 ENCOUNTER — Telehealth (HOSPITAL_BASED_OUTPATIENT_CLINIC_OR_DEPARTMENT_OTHER): Payer: Self-pay

## 2024-04-20 NOTE — Telephone Encounter (Signed)
 Copied from CRM [id number]. Topic: Clinical - Medical Advice >> Apr 20, 2024  4:02 PM Antwanette L wrote: Reason for CRM: Shenise, a child psychotherapist with [employer], is calling because she completed a home visit with the patient and was unaware that he was on oxygen. She is requesting confirmation on whether the patient is supposed to be on oxygen. There is no pcp listed on the pt chart. Patient  had a new patient appt with Dr. De Cuba on 02/13/23. Tori is requesting a callback at [phone]

## 2024-04-23 ENCOUNTER — Ambulatory Visit (INDEPENDENT_AMBULATORY_CARE_PROVIDER_SITE_OTHER)
Admission: EM | Admit: 2024-04-23 | Discharge: 2024-04-23 | Disposition: A | Discharge status: Home / Self Care | Source: Home / Self Care

## 2024-04-23 ENCOUNTER — Ambulatory Visit: Payer: Self-pay

## 2024-04-23 ENCOUNTER — Other Ambulatory Visit: Payer: Self-pay

## 2024-04-23 ENCOUNTER — Emergency Department (HOSPITAL_COMMUNITY)
Admission: EM | Admit: 2024-04-23 | Discharge: 2024-04-23 | Disposition: A | Discharge status: Home / Self Care | Attending: Emergency Medicine | Admitting: Emergency Medicine

## 2024-04-23 DIAGNOSIS — G319 Degenerative disease of nervous system, unspecified: Secondary | ICD-10-CM | POA: Diagnosis not present

## 2024-04-23 DIAGNOSIS — I700 Atherosclerosis of aorta: Secondary | ICD-10-CM | POA: Insufficient documentation

## 2024-04-23 DIAGNOSIS — Z8619 Personal history of other infectious and parasitic diseases: Secondary | ICD-10-CM | POA: Insufficient documentation

## 2024-04-23 DIAGNOSIS — F411 Generalized anxiety disorder: Secondary | ICD-10-CM | POA: Insufficient documentation

## 2024-04-23 DIAGNOSIS — F1111 Opioid abuse, in remission: Secondary | ICD-10-CM | POA: Diagnosis not present

## 2024-04-23 DIAGNOSIS — G894 Chronic pain syndrome: Secondary | ICD-10-CM | POA: Insufficient documentation

## 2024-04-23 DIAGNOSIS — F308 Other manic episodes: Secondary | ICD-10-CM

## 2024-04-23 DIAGNOSIS — R1310 Dysphagia, unspecified: Secondary | ICD-10-CM | POA: Insufficient documentation

## 2024-04-23 DIAGNOSIS — I10 Essential (primary) hypertension: Secondary | ICD-10-CM | POA: Diagnosis not present

## 2024-04-23 DIAGNOSIS — F1321 Sedative, hypnotic or anxiolytic dependence, in remission: Secondary | ICD-10-CM | POA: Insufficient documentation

## 2024-04-23 DIAGNOSIS — R0602 Shortness of breath: Secondary | ICD-10-CM | POA: Diagnosis present

## 2024-04-23 DIAGNOSIS — G629 Polyneuropathy, unspecified: Secondary | ICD-10-CM | POA: Diagnosis not present

## 2024-04-23 DIAGNOSIS — E785 Hyperlipidemia, unspecified: Secondary | ICD-10-CM | POA: Diagnosis not present

## 2024-04-23 NOTE — Discharge Instructions (Signed)
 You will need to see a primary care doctor, who can try and assist you with your symptoms and also consult or refer you to a neurologist or ACT team or even palliative/hospice if needed.

## 2024-04-23 NOTE — Telephone Encounter (Signed)
 FYI Only or Action Required?: Action required by provider: referral request, clinical question for provider, and update on patient condition.  Patient was last seen in primary care on 05/29/2023 by Knute Thersia Bitters, FNP.  Called Nurse Triage reporting Shortness of Breath.  Symptoms began several days ago.  Interventions attempted: OTC medications: boost oxygen; humidifier/at home nebulizer.  Symptoms are: rapidly worsening.  Triage Disposition: Call PCP Now  Patient/caregiver understands and will follow disposition?: Yes     Message from Gattis SQUIBB sent at 04/23/2024 10:59 AM EST  Reason for Triage: Pt states he is having a hard time breathing and has not eaten in a couple of days      Reason for Disposition  [1] Caller requests to speak ONLY to PCP AND [2] URGENT question  Answer Assessment - Initial Assessment Questions 1. REASON FOR CALL or QUESTION: What is your reason for calling today? or How can I best     Returned pt's call to f/u on symptoms. Pt states she is experiencing continued SOB, unable to eat/drink, not taking any medications. 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 Pt states he has been told multiple times to go to ED and request palliative care but is turned away from ED and told without terminal illness or disease he cannot be referred to palliative care. Pt states I can't keep going on like this. Everyone is writing me off that I have behavioral and mental health issues but I am suffocating. I was hoping the neurotoxicity would put me into a coma but it isn't coming quick enough and I am just going to sit here and suffocate. Pt is audibly clearing throat, rapid speech and sounds in distress. Pt was d/c from ED today for same symptoms, pt states he never gets better. He has been in touch with Palliative care at [facility] long who have told him to go through ED for admission but ED staff do not admit him. Pt is requesting referral to ACT team vs  at home palliative care. Pt is currently using OTC boost oxygen and humidifier at home. Reassured him that I would update providers on situation and referral request. Pt very appreciative and calmer at end of call. Pt is at home with his brother, states he can come into clinic if provider wants to see him. He has called EMS in past but did just get home from ED.  Protocols used: PCP Call - No Triage-A-AH

## 2024-04-23 NOTE — ED Notes (Signed)
 PT has called for ride.

## 2024-04-23 NOTE — Discharge Summary (Signed)
 [name] to be discharged Home per NP order. An After Visit Summary was printed and given to the patient. Patient escorted out and discharged home via private auto.  Dorla Jung  04/23/2024 4:53 PM

## 2024-04-23 NOTE — Discharge Instructions (Addendum)
" °  Please contact one of the following facilities to start medication management and therapy services:   [facility] [address] ([address] [place]) [place], [place]  [address] Phone: [phone]   [facility] [address] [place] [address] [place], [place] [address] [phone]   [facility] [address] [place], [place] [address] [phone]   [facility] [place] [address] [address] [place], [place] [address] [phone]   [facility] [address] [place], [place] [address] [phone]   [facility] [address] [place], [place] [address] [phone]   [facility] [address], [place], [place] [address] [phone]  "

## 2024-04-23 NOTE — ED Provider Notes (Signed)
 Behavioral Health Urgent Care Medical Screening Exam  Patient Name: [name] MRN: [id number] Date of Evaluation: 04/23/24 Chief Complaint:   I'm here about my neurotoxicity and my chronic pain Diagnosis:  Final diagnoses:  Chronic pain syndrome  Generalized anxiety disorder    History of Present illness: [name] is a 69 y.o. male. with past psychiatric history of GAD, MDD with psychotic features, and paranoid disorder; substance use history to include  cannabis use, remote history of heroine overdose, benzodiazepine abuse in remission; past medical history of  HTN, hyperlipidemia, hepatitis C, cerebral atrophy and atherosclerosis of aorta who presents to [facility] behavioral health urgent care voluntarily as a walk-in with concerns regarding his neurotoxicity and chronic pain.  He states he was seen in the emergency room this morning and reports they said nothing was wrong with him and that he needs to come to mental health.  On review of ED notes from this morning it was recommended that he follow-up with a PCP and neurology.  [name] reports that he feels pins-and-needle pain all the time it is becoming unbearable, he states that it will all in soon. When asked what he means by that he states well you will not last long if you do not eat or drink anything.  He reports that he is having difficulty swallowing resulting in him eating 5 or 6 bites of food and a half a glass of water in the last day. [name] endorses poor sleep stating that he has not slept in days and then later in the evaluation contradicts himself stating that he slept fine last night.  [name] denies current substance use and reports that he lives in his own home with a friend.  During evaluation [name] is seated and appears to be in significant pain with constant rocking back-and-forth and rubbing his thighs, facial grimacing, and verbal endorsement of ongoing chronic pain. He is alert, oriented x 4 and  cooperative.  His mood is irritable with full range affect.  He has rapid and somewhat pressured speech.   His thought process is coherent with circumstantial associations and he exhibits rumination on his history of neurotoxicity, not being able to take any medications, and the idea that any recommendation made by healthcare providers will not help him.  He reports he cannot take any medication as it will worsen his neurotoxicity.  He denies grandiosity, decreased need for sleep, irritability.  He is not displaying flight of ideas or increased goal-directed activity on interview.  Objectively, there is no evidence of full mania, psychosis, or delusional thinking. He denies suicidal/self-harm/homicidal ideation, psychosis, and paranoia.      Flowsheet Row ED from 04/23/2024 in [facility] Most recent reading at 04/23/2024  4:04 PM ED from 04/23/2024 in [facility] Emergency Department at [facility] Most recent reading at 04/23/2024  8:55 AM ED from 01/28/2024 in [facility] Emergency Department at [facility] Most recent reading at 01/28/2024  6:36 PM  C-SSRS RISK CATEGORY No Risk No Risk No Risk    Psychiatric Specialty Exam  Presentation  General Appearance:Casual  Eye Contact:Good  Speech:Pressured  Speech Volume:Normal  Handedness:Right   Mood and Affect  Mood: Irritable  Affect: Full Range   Thought Process  Thought Processes: Coherent  Descriptions of Associations:Circumstantial  Orientation:Full (Time, Place and Person)  Thought Content:Rumination  Diagnosis of Schizophrenia or Schizoaffective disorder in past: No   Hallucinations:None  Ideas of Reference:None  Suicidal Thoughts:No Without  Intent; Without Plan  Homicidal Thoughts:No   Sensorium  Memory: Immediate Fair  Judgment: Fair  Insight: Poor   Executive Functions  Concentration: Fair  Attention Span: Fair  Recall: [employer] of  Knowledge: Fair  Language: Fair   Psychomotor Activity  Psychomotor Activity: Restlessness   Assets  Assets: Housing; Financial Resources/Insurance   Sleep  Sleep: Poor  Number of hours:  3   Physical Exam: Physical Exam Vitals and nursing note reviewed.  HENT:     Head: Normocephalic.  Cardiovascular:     Rate and Rhythm: Normal rate.  Pulmonary:     Effort: Pulmonary effort is normal.  Neurological:     Mental Status: He is alert and oriented to person, place, and time.  Psychiatric:        Attention and Perception: He does not perceive auditory or visual hallucinations.        Mood and Affect: Mood is anxious.        Speech: Speech is rapid and pressured.        Thought Content: Thought content is not paranoid. Thought content does not include homicidal or suicidal ideation. Thought content does not include homicidal or suicidal plan.    Review of Systems  Constitutional:        (+) generalized pain  Cardiovascular:  Negative for chest pain.  Psychiatric/Behavioral:  Negative for hallucinations, substance abuse and suicidal ideas. The patient is nervous/anxious.   All other systems reviewed and are negative.  Blood pressure (!) 134/100, pulse 83, resp. rate 20, SpO2 99%. There is no height or weight on file to calculate BMI.  Musculoskeletal: Strength & Muscle Tone: within normal limits Gait & Station: normal Patient leans: N/A   [facility]SE Discharge Disposition for Follow up and Recommendations: Based on my evaluation the patient does not appear to have an emergency medical condition and can be discharged with resources and follow up care in outpatient services for Medication Management and Individual Therapy   This document was prepared using Dragon voice recognition software and may include unintentional dictation errors.  Rockie Rams DNP, MBA, PMHNP-BC, FNP-BC  Psychiatric Mental Health Nurse Practitioner [facility]

## 2024-04-23 NOTE — ED Provider Notes (Signed)
 " [place] EMERGENCY DEPARTMENT AT [facility] Provider Note   CSN: [id number] Arrival date & time: 04/23/24  9163     Patient presents with: Shortness of Breath   [name] is a 69 y.o. male.  with a history Of chronic Pain Syndrome and generalized anxiety disorder presents today via EMS with generalized pain.  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  He has been seen here multiple times for same and was requesting hospice and palliative care.  Patient states he is unable to eat or drink anything for the past 2 days.  He notes that he has to gag every time he tries to drink water. He does not take any medication.  Patient notes that he called neurology who are not helping him.  He wishes not to take any medication at this time as it worsen his  neurotoxicity.  Patient notes that he also has shortness of breath due to his pain.  Patient has been googling his symptoms and he knows exactly what is going.  States he does not want to work as it does not show anything for him.  Denies alcohol or drug use.  Denies SI, HI, hallucinations, fevers, cough, chest pain.      Shortness of Breath      Prior to Admission medications  Medication Sig Start Date End Date Taking? Authorizing Provider  acetaminophen  (TYLENOL ) 325 MG tablet Take 650 mg by mouth every 6 (six) hours as needed.    [provider]  propranolol  (INDERAL ) 20 MG tablet Take 1 tablet (20 mg total) by mouth 2 (two) times daily. 05/21/23   Donnelly Mellow, MD    Allergies: Diazepam, Morphine  and codeine, and Gabapentin     Review of Systems  Musculoskeletal:  Positive for myalgias.    Updated Vital Signs BP (!) 120/90   Pulse 93   Temp 97.7 F (36.5 C) (Oral)   Resp (!) 24   SpO2 96%   Physical Exam Vitals and nursing note reviewed.  Constitutional:      General: He is not in acute distress.    Appearance: He  is well-developed.     Comments: Patient's speech is rapid and not coherent  HENT:     Head: Normocephalic and atraumatic.  Eyes:     Conjunctiva/sclera: Conjunctivae normal.  Cardiovascular:     Rate and Rhythm: Normal rate and regular rhythm.     Heart sounds: No murmur heard. Pulmonary:     Effort: Pulmonary effort is normal. No respiratory distress.     Breath sounds: Normal breath sounds. No decreased breath sounds, wheezing, rhonchi or rales.  Abdominal:     Palpations: Abdomen is soft.     Tenderness: There is no abdominal tenderness. There is no right CVA tenderness, left CVA tenderness, guarding or rebound.  Musculoskeletal:        General: No swelling.     Cervical back: Neck supple.  Skin:    General: Skin is warm and dry.     Capillary Refill: Capillary refill takes less than 2 seconds.     Findings: No abrasion or bruising.  Neurological:     Mental Status: He is alert and oriented to person, place, and time. Mental status is at baseline.     Cranial Nerves: Cranial nerves 2-12 are intact.     Sensory: Sensation is intact.     Motor: Motor function is intact.  Coordination: Coordination is intact.     Gait: Gait is intact.  Psychiatric:        Mood and Affect: Mood normal.        Behavior: Behavior is not agitated.        Thought Content: Thought content does not include homicidal or suicidal ideation. Thought content does not include homicidal or suicidal plan.     Comments: Rapid speech     (all labs ordered are listed, but only abnormal results are displayed) Labs Reviewed - No data to display  EKG: None  Radiology: No results found.   Procedures   Medications Ordered in the ED - No data to display                                  Medical Decision Making    Patient with a history of chronic pain syndrome presents for generalized pain.  He reports that he has neurotoxicity which is causing his symptoms and is requesting palliative care.   Patient notes he has not been eating or drinking for the past 2 days. He has been seen here multiple times for similar and was referred to neurology and psychiatry.  Patient states that he called neurology but states nobody is willing to help him.  No new medication.  Denies SI, HI.  He does not want any medications as he states this will make his symptoms worse.  This patient presents to the ED for concern of myalgia, this involves an extensive number of treatment options, and is a complaint that carries with it a high risk of complications and morbidity.  The differential diagnosis includes paranoia, myalgia, infection, electrolytes imbalance, fibromyalgia, chronic pain  - Reviewed prior ED notes where patient presented with similar symptoms.  No noted terminal illness.   Exam is reassuring with no neurological deficit.  He does have rapid and pressured speech on evaluation.  Vital signs stable.  Lung sounds are clear. Abdomen soft and nontender.  No edema to bilateral lower extremities.  No focal tenderness.  Patient does not want any medication at this time.  Labs and imaging not necessary at this time.  Based on patient's presentation, will recommended to follow-up with PCP and neurology for further evaluation.  Patient is in agreement with plan and is stable for discharge.    Case discussed with Dr. Charlyn, MD, who assessed the patient and is in agreement with plan.        Final diagnoses:  Neuropathy    ED Discharge Orders     None          Braxton Dubois, PA-C 04/23/24 1038  "

## 2024-04-23 NOTE — Progress Notes (Signed)
" °   04/23/24 1557  [facility] Triage Screening (Walk-ins at [facility] only)  How Did You Hear About Us ? Self  What Is the Reason for Your Visit/Call Today? [name] is a 69 year old male presenting to [facility] unaccompanied. Pt states he has brain damage and is in so much pain, that no one will help him. Pt states he is not sleeping well and has not been well mentally for quite some time. Pt states he is unsure on how we can help him today. Pt states that he cannot take any medication due to his brain damage. Pt denies substance use, Si, Hi and AVH. Per chart review, pt has a hx of GAD and Paranoid thoughts.  How Long Has This Been Causing You Problems? <Week  Have You Recently Had Any Thoughts About Hurting Yourself? No  Are You Planning to Commit Suicide/Harm Yourself At This time? No  Have you Recently Had Thoughts About Hurting Someone [name]? No  Are You Planning To Harm Someone At This Time? No  Exploitation of patient/patient's resources Denies  Self-Neglect Denies  Possible abuse reported to: Other (Comment)  Are you currently experiencing any auditory, visual or other hallucinations? No  Have You Used Any Alcohol or Drugs in the Past 24 Hours? No  Do you have any current medical co-morbidities that require immediate attention? No  Clinician description of patient physical appearance/behavior: irritable, in pain  What Do You Feel Would Help You the Most Today? Stress Management  If access to [facility] was not available, would you have sought care in the Emergency Department? No  Determination of Need Routine (7 days)  Options For Referral Outpatient Therapy    "

## 2024-04-23 NOTE — ED Triage Notes (Addendum)
 Pt arrives via EMS from home. Pt has complaints of shortness of breath that began 2 years and 6 months ago. Pt reports poor PO intake over the past 2 months. EMS states they are very familiar with the pt, and that he is paranoid at baseline.  During triage, pt verbalized you can't trust my vital signs, they are all over the place and look fine but I'm not.   EMS VS  BP 118/78 P-110 O2-96%ra  Glucose- 134mg /dL

## 2024-04-24 ENCOUNTER — Other Ambulatory Visit: Payer: Self-pay

## 2024-04-24 ENCOUNTER — Emergency Department (HOSPITAL_COMMUNITY)
Admission: EM | Admit: 2024-04-24 | Discharge: 2024-04-24 | Disposition: A | Discharge status: Home / Self Care | Attending: Emergency Medicine | Admitting: Emergency Medicine

## 2024-04-24 ENCOUNTER — Emergency Department (HOSPITAL_COMMUNITY)

## 2024-04-24 ENCOUNTER — Encounter (HOSPITAL_COMMUNITY): Payer: Self-pay

## 2024-04-24 DIAGNOSIS — R1013 Epigastric pain: Secondary | ICD-10-CM | POA: Insufficient documentation

## 2024-04-24 DIAGNOSIS — I10 Essential (primary) hypertension: Secondary | ICD-10-CM | POA: Diagnosis not present

## 2024-04-24 DIAGNOSIS — M79604 Pain in right leg: Secondary | ICD-10-CM | POA: Diagnosis not present

## 2024-04-24 DIAGNOSIS — G8929 Other chronic pain: Secondary | ICD-10-CM | POA: Diagnosis not present

## 2024-04-24 DIAGNOSIS — M79605 Pain in left leg: Secondary | ICD-10-CM | POA: Diagnosis not present

## 2024-04-24 DIAGNOSIS — M79621 Pain in right upper arm: Secondary | ICD-10-CM | POA: Insufficient documentation

## 2024-04-24 DIAGNOSIS — M79622 Pain in left upper arm: Secondary | ICD-10-CM | POA: Insufficient documentation

## 2024-04-24 DIAGNOSIS — R0602 Shortness of breath: Secondary | ICD-10-CM | POA: Diagnosis present

## 2024-04-24 DIAGNOSIS — R519 Headache, unspecified: Secondary | ICD-10-CM | POA: Insufficient documentation

## 2024-04-24 DIAGNOSIS — Z79899 Other long term (current) drug therapy: Secondary | ICD-10-CM | POA: Diagnosis not present

## 2024-04-24 LAB — COMPREHENSIVE METABOLIC PANEL WITH GFR
ALT: 26 U/L (ref 0–44)
AST: 27 U/L (ref 15–41)
Albumin: 4.5 g/dL (ref 3.5–5.0)
Alkaline Phosphatase: 81 U/L (ref 38–126)
Anion gap: 12 (ref 5–15)
BUN: 21 mg/dL (ref 8–23)
CO2: 24 mmol/L (ref 22–32)
Calcium: 9.7 mg/dL (ref 8.9–10.3)
Chloride: 108 mmol/L (ref 98–111)
Creatinine, Ser: 1.07 mg/dL (ref 0.61–1.24)
GFR, Estimated: >60 mL/min
Glucose, Bld: 94 mg/dL (ref 70–99)
Potassium: 3.7 mmol/L (ref 3.5–5.1)
Sodium: 144 mmol/L (ref 135–145)
Total Bilirubin: 0.9 mg/dL (ref 0.0–1.2)
Total Protein: 7.3 g/dL (ref 6.5–8.1)

## 2024-04-24 LAB — CBC WITH DIFFERENTIAL/PLATELET
Abs Immature Granulocytes: 0.01 K/uL (ref 0.00–0.07)
Basophils Absolute: 0.1 K/uL (ref 0.0–0.1)
Basophils Relative: 1 %
Eosinophils Absolute: 0 K/uL (ref 0.0–0.5)
Eosinophils Relative: 0 %
HCT: 45.3 % (ref 39.0–52.0)
Hemoglobin: 15.3 g/dL (ref 13.0–17.0)
Immature Granulocytes: 0 %
Lymphocytes Relative: 27 %
Lymphs Abs: 1.5 K/uL (ref 0.7–4.0)
MCH: 27.1 pg (ref 26.0–34.0)
MCHC: 33.8 g/dL (ref 30.0–36.0)
MCV: 80.3 fL (ref 80.0–100.0)
Monocytes Absolute: 0.5 K/uL (ref 0.1–1.0)
Monocytes Relative: 10 %
Neutro Abs: 3.4 K/uL (ref 1.7–7.7)
Neutrophils Relative %: 62 %
Platelets: 264 K/uL (ref 150–400)
RBC: 5.64 MIL/uL (ref 4.22–5.81)
RDW: 13.9 % (ref 11.5–15.5)
WBC: 5.5 K/uL (ref 4.0–10.5)
nRBC: 0 % (ref 0.0–0.2)

## 2024-04-24 LAB — TROPONIN T, HIGH SENSITIVITY
Troponin T High Sensitivity: <15 ng/L (ref 0–19)
Troponin T High Sensitivity: <15 ng/L (ref 0–19)

## 2024-04-24 LAB — MAGNESIUM: Magnesium: 2.2 mg/dL (ref 1.7–2.4)

## 2024-04-24 LAB — VITAMIN B12: Vitamin B-12: 719 pg/mL (ref 180–914)

## 2024-04-24 LAB — TSH: TSH: 1.29 u[IU]/mL (ref 0.350–4.500)

## 2024-04-24 LAB — ETHANOL: Alcohol, Ethyl (B): <15 mg/dL

## 2024-04-24 LAB — AMMONIA: Ammonia: 32 umol/L (ref 9–35)

## 2024-04-24 LAB — SALICYLATE LEVEL: Salicylate Lvl: <7 mg/dL — ABNORMAL LOW (ref 7.0–30.0)

## 2024-04-24 LAB — LIPASE, BLOOD: Lipase: 19 U/L (ref 11–51)

## 2024-04-24 LAB — I-STAT CG4 LACTIC ACID, ED: Lactic Acid, Venous: 1.4 mmol/L (ref 0.5–1.9)

## 2024-04-24 LAB — ACETAMINOPHEN LEVEL: Acetaminophen (Tylenol), Serum: <10 ug/mL — ABNORMAL LOW (ref 10–30)

## 2024-04-24 MED ORDER — IOHEXOL 350 MG/ML SOLN
100.0000 mL | Freq: Once | INTRAVENOUS | Status: AC | PRN
  Administered 2024-04-24: 100 mL via INTRAVENOUS

## 2024-04-24 NOTE — Discharge Instructions (Addendum)
 It was a pleasure taking care of you here today  As we discussed your workup today is reassuring  I would make sure to follow-up outpatient with pulmonology  Return for new or worsening symptoms

## 2024-04-24 NOTE — ED Notes (Signed)
 Patient transported to CT

## 2024-04-24 NOTE — ED Triage Notes (Signed)
 Pt BIB [facility] from home for [facility] that began 2 yrs and 6 mo ago, pins and needles and itching all over. Pt was seen yesterday in ED for same. Pulse 125, all other VSS per EMS.

## 2024-04-24 NOTE — ED Provider Notes (Signed)
 " [place] EMERGENCY DEPARTMENT AT [facility] Provider Note   CSN: [id number] Arrival date & time: 04/24/24  1440     Patient presents with: Shortness of Breath and Generalized Body Aches   [name] is a 69 y.o. male history of hypertension, paranoia, chronic hepatitis C, hypertension, chronic pain, prior EtOH use, sober for 19 years here for evaluation of shortness of breath.  Patient states has been ongoing issue.  He states he has been having intermittent headaches, pain to his bilateral upper and lower extremities as well as shortness of breath.  Shortness of breath began greater than 2 years ago.  He has been seen multiple times for similar.  He states no one believes me.  He states he was initially seen by [facility] pulmonology however he cannot follow-up with pulmonary function test.  He states he cannot keep a primary care doctor because they think this is all in my head.  He was seen in the ED yesterday as well as at behavioral health and was psychiatrically cleared.  Patient states symptoms are unchanged. No weakness, unilateral numbness, thunderclap headache, neck stiffness, neck rigidity, chest pain, hemoptysis, melena, bloody stool, back pain.  No recent falls or injuries.  He denies any illicit substance use.  He states he cannot take any medications because they give me all side effects.  Not willing to expound.  He states he thinks he has neurotoxicity after being on benzodiazepines 20 years ago.  He states he does not take any medications currently.  He states he has chronic difficulty swallowing which is unchanged.  Has been going on for many years.  Patient is very perseverant that he has done his [employer] research and knows that his chronic pain is because of neurotoxicity.  Patient denies any SI, HI, AVH.   HPI     Prior to Admission medications  Medication Sig Start Date End Date Taking? Authorizing Provider  acetaminophen  (TYLENOL ) 325 MG tablet  Take 650 mg by mouth every 6 (six) hours as needed.    [provider]  propranolol  (INDERAL ) 20 MG tablet Take 1 tablet (20 mg total) by mouth 2 (two) times daily. 05/21/23   Jadapalle, Sree, MD    Allergies: Diazepam, Morphine  and codeine, and Gabapentin     Review of Systems  Constitutional: Negative.   HENT: Negative.    Respiratory:  Positive for cough and shortness of breath.   Cardiovascular: Negative.   Gastrointestinal: Negative.   Genitourinary: Negative.   Musculoskeletal:  Positive for myalgias.  Skin: Negative.   Neurological: Negative.   All other systems reviewed and are negative.   Updated Vital Signs BP (!) 149/105   Pulse 92   Temp 98.3 F (36.8 C) (Oral)   Resp (!) 25   SpO2 95%   Physical Exam Vitals and nursing note reviewed.  Constitutional:      General: He is not in acute distress.    Appearance: He is well-developed. He is not ill-appearing, toxic-appearing or diaphoretic.  HENT:     Head: Normocephalic and atraumatic.  Eyes:     Pupils: Pupils are equal, round, and reactive to light.  Cardiovascular:     Rate and Rhythm: Normal rate and regular rhythm.     Pulses: Normal pulses.          Radial pulses are 2+ on the right side and 2+ on the left side.       Dorsalis pedis pulses are 2+ on the right side  and 2+ on the left side.     Heart sounds: Normal heart sounds.  Pulmonary:     Effort: Pulmonary effort is normal. No respiratory distress.     Breath sounds: Normal breath sounds.  Chest:     Comments: Nontender chest wall, no crepitus or step-off Abdominal:     General: Bowel sounds are normal. There is no distension.     Palpations: Abdomen is soft.     Comments: Soft, nontender, no rebound or guarding  Musculoskeletal:        General: Normal range of motion.     Cervical back: Normal range of motion and neck supple.     Right lower leg: No tenderness. No edema.     Left lower leg: No tenderness. No edema.     Comments: No  bony tenderness, compartments soft.  Full range of motion.  No lower extremity edema  Skin:    General: Skin is warm and dry.     Capillary Refill: Capillary refill takes less than 2 seconds.  Neurological:     General: No focal deficit present.     Mental Status: He is alert and oriented to person, place, and time.     Cranial Nerves: Cranial nerves 2-12 are intact.     Sensory: Sensation is intact.     Motor: Motor function is intact.     Coordination: Coordination is intact.     Gait: Gait is intact.     Comments: Cranial nerves II through XII grossly intact Equal sensation bilaterally upper and lower extremities 5/5 strength upper and lower extremities without difficulty Reflexes 2+ upper and lower extremity Ambulatory without ataxic gait Negative finger-nose Negative heel-to-shin No pronator drift  Psychiatric:        Attention and Perception: Attention normal.        Mood and Affect: Affect is labile.        Speech: Speech normal.        Behavior: Behavior normal. Behavior is cooperative.        Thought Content: Thought content normal.        Cognition and Memory: Cognition normal.     Comments: Labile affect Denies SI, HI, AVH     (all labs ordered are listed, but only abnormal results are displayed) Labs Reviewed  ACETAMINOPHEN  LEVEL - Abnormal; Notable for the following components:      Result Value   Acetaminophen  (Tylenol ), Serum <10 (*)    All other components within normal limits  SALICYLATE LEVEL - Abnormal; Notable for the following components:   Salicylate Lvl <7.0 (*)    All other components within normal limits  CULTURE, BLOOD (ROUTINE X 2)  CULTURE, BLOOD (ROUTINE X 2)  CBC WITH DIFFERENTIAL/PLATELET  COMPREHENSIVE METABOLIC PANEL WITH GFR  LIPASE, BLOOD  AMMONIA  TSH  VITAMIN B12  ETHANOL  MAGNESIUM   I-STAT CG4 LACTIC ACID, ED  I-STAT CG4 LACTIC ACID, ED  TROPONIN T, HIGH SENSITIVITY  TROPONIN T, HIGH SENSITIVITY     EKG: None  Radiology: CT ABDOMEN PELVIS W CONTRAST Result Date: 04/24/2024 EXAM: CT ABDOMEN AND PELVIS WITH CONTRAST 04/24/2024 07:03:35 PM TECHNIQUE: CT of the abdomen and pelvis was performed with the administration of 100 mL of iohexol  (OMNIPAQUE ) 350 MG/ML injection. Multiplanar reformatted images are provided for review. Automated exposure control, iterative reconstruction, and/or weight-based adjustment of the mA/kV was utilized to reduce the radiation dose to as low as reasonably achievable. COMPARISON: 03/24/2019 CLINICAL HISTORY: Epigastric pain. FINDINGS: LOWER CHEST: No  acute abnormality. LIVER: The liver is unremarkable. GALLBLADDER AND BILE DUCTS: Gallbladder is unremarkable. No biliary ductal dilatation. SPLEEN: No acute abnormality. PANCREAS: No acute abnormality. ADRENAL GLANDS: No acute abnormality. KIDNEYS, URETERS AND BLADDER: Subcentimeter right upper pole renal cyst, benign (Bosniak 1). Per consensus, no follow-up is needed for simple Bosniak type 1 and 2 renal cysts, unless the patient has a malignancy history or risk factors. No stones in the kidneys or ureters. No hydronephrosis. No perinephric or periureteral stranding. Urinary bladder is unremarkable. GI AND BOWEL: Stomach demonstrates no acute abnormality. There is no bowel obstruction. Normal appendix (image 72). PERITONEUM AND RETROPERITONEUM: No ascites. No free air. VASCULATURE: Aorta is normal in caliber. LYMPH NODES: No lymphadenopathy. REPRODUCTIVE ORGANS: Prostate is unremarkable. BONES AND SOFT TISSUES: No acute osseous abnormality. No focal soft tissue abnormality. IMPRESSION: 1. No acute findings in the abdomen or pelvis. Electronically signed by: Pinkie Pebbles MD 04/24/2024 07:16 PM EST RP Workstation: [id number]   CT ANGIO HEAD NECK W WO CM Result Date: 04/24/2024 EXAM: CTA Head and Neck with Intravenous Contrast. CT Head without Contrast. CLINICAL HISTORY: Neuro deficit, acute, stroke suspected; sudden HA.  TECHNIQUE: Axial CTA images of the head and neck performed with intravenous contrast. MIP reconstructed images were created and reviewed. Axial computed tomography images of the head/brain performed without intravenous contrast. Note: Per PQRS, the description of internal carotid artery percent stenosis, including 0 percent or normal exam, is based on North American Symptomatic Carotid Endarterectomy Trial (NASCET) criteria. Dose reduction technique was used including one or more of the following: automated exposure control, adjustment of mA and kV according to patient size, and/or iterative reconstruction. CONTRAST: Without and with; 100 mL iohexol  350 mg/mL injection. COMPARISON: 10/01/2020 FINDINGS: CT HEAD: BRAIN: No acute intraparenchymal hemorrhage. No mass lesion. No CT evidence for acute territorial infarct. No midline shift or extra-axial collection. VENTRICLES: No hydrocephalus. ORBITS: The orbits are unremarkable. SINUSES AND MASTOIDS: The paranasal sinuses and mastoid air cells are clear. CTA NECK: COMMON CAROTID ARTERIES: No significant stenosis. No dissection or occlusion. INTERNAL CAROTID ARTERIES: No stenosis by NASCET criteria. No dissection or occlusion. VERTEBRAL ARTERIES: No significant stenosis. No dissection or occlusion. CTA HEAD: ANTERIOR CEREBRAL ARTERIES: No significant stenosis. No occlusion. No aneurysm. MIDDLE CEREBRAL ARTERIES: No significant stenosis. No occlusion. No aneurysm. POSTERIOR CEREBRAL ARTERIES: No significant stenosis. No occlusion. No aneurysm. BASILAR ARTERY: No significant stenosis. No occlusion. No aneurysm. OTHER: SOFT TISSUES: No acute finding. No masses or lymphadenopathy. BONES: No acute osseous abnormality. IMPRESSION: 1. No acute intracranial hemorrhage or ischemic change. 2. No evidence of significant stenosis, aneurysmal dilatation, or dissection involving the arteries of the head and neck. Electronically signed by: Franky Stanford MD 04/24/2024 07:16 PM EST RP  Workstation: [id number]   CT Angio Chest PE W and/or Wo Contrast Result Date: 04/24/2024 EXAM: CTA CHEST 04/24/2024 07:03:35 PM TECHNIQUE: CTA of the chest was performed after the administration of intravenous contrast. Multiplanar reformatted images are provided for review. MIP images are provided for review. Automated exposure control, iterative reconstruction, and/or weight based adjustment of the mA/kV was utilized to reduce the radiation dose to as low as reasonably achievable. COMPARISON: None available. CLINICAL HISTORY: Pulmonary embolism (PE) suspected, high prob; cp, sob, abd pain FINDINGS: PULMONARY ARTERIES: Pulmonary arteries are adequately opacified for evaluation. No acute pulmonary embolus. Main pulmonary artery is normal in caliber. MEDIASTINUM: The heart and pericardium demonstrate no acute abnormality. There is no acute abnormality of the thoracic aorta. LYMPH NODES: No mediastinal, hilar or axillary  lymphadenopathy. LUNGS AND PLEURA: The lungs are without acute process. No focal consolidation or pulmonary edema. No evidence of pleural effusion or pneumothorax. UPPER ABDOMEN: Limited images of the upper abdomen are unremarkable. SOFT TISSUES AND BONES: No acute bone or soft tissue abnormality. IMPRESSION: 1. No pulmonary embolism or acute pulmonary abnormality. Electronically signed by: Greig Pique MD 04/24/2024 07:11 PM EST RP Workstation: [id number]     Procedures   Medications Ordered in the ED  iohexol  (OMNIPAQUE ) 350 MG/ML injection 100 mL (100 mLs Intravenous Contrast Given 04/24/24 3563)    69 year old here for evaluation multiple complaints.  Chronic shortness of breath, chronic pain.  States has been going on long-term he has seen multiple providers in the urgent care, emergency department in outpatient setting including specialist.  He believes that no one listens to him.  He states he has done his research and believes this is all due to neurotoxicity due to  benzodiazepine use many years ago.  Here patient has reassuring exam.  He states every time he comes to the emergency department that no one is willing to do blood work or imaging to determine if there is something else going on.  He has a nonfocal neuroexam.  His heart and lungs are clear however does intermittently become anxious.  He has no SI, HI, AVH.  Does not appear to be obviously manic or paranoid.  He was actually seen by behavioral health less than 24 hours ago and was psychiatrically cleared.  He was also seen in the ED yesterday.  Will plan on labs, imaging to assure no other etiology of his symptoms.  Labs and imaging personally viewed interpreted:  CBC, CMP, magnesium , lipase, B12, TSH, lactic, troponin, ethanol, salicylate, acetaminophen , ammonia within normal limit CT angio chest, CT angio head, neck, CT abdomen pelvis without acute abnormality EKG without ischemic changes  Patient reassessed.  Discussed labs and imaging.  Likely chronic in nature.  He will need follow-up outpatient.  Does not appear to be acutely psychiatrically decompensated.  No SI, HI, AVH.  He associate professor.  Considered ACS, PE, dissection, CVA, MG, Guillain-Barr, toxidrome, electrolyte abnormality, sepsis, traumatic injury, Warnicke's encephalopathy/ Korsakoff syndrome, hepatic encephalopathy  Patient discharged home.  Will have him follow-up outpatient.  The patient has been appropriately medically screened and/or stabilized in the ED. I have low suspicion for any other emergent medical condition which would require further screening, evaluation or treatment in the ED or require inpatient management.  Patient is hemodynamically stable and in no acute distress.  Patient able to ambulate in department prior to ED.  Evaluation does not show acute pathology that would require ongoing or additional emergent interventions while in the emergency department or further inpatient treatment.  I have discussed the  diagnosis with the patient and answered all questions.  Pain is been managed while in the emergency department and patient has no further complaints prior to discharge.  Patient is comfortable with plan discussed in room and is stable for discharge at this time.  I have discussed strict return precautions for returning to the emergency department.  Patient was encouraged to follow-up with PCP/specialist refer to at discharge.                                    Medical Decision Making Amount and/or Complexity of Data Reviewed External Data Reviewed: labs, radiology, ECG and notes. Labs: ordered. Decision-making details documented in ED  Course. Radiology: ordered and independent interpretation performed. Decision-making details documented in ED Course. ECG/medicine tests: ordered and independent interpretation performed. Decision-making details documented in ED Course.  Risk OTC drugs. Prescription drug management. Decision regarding hospitalization. Diagnosis or treatment significantly limited by social determinants of health.       Final diagnoses:  Shortness of breath    ED Discharge Orders     None          Larraine Argo A, PA-C 04/24/24 2321    HortonRoxie HERO, DO 04/24/24 2343  "

## 2024-04-24 NOTE — ED Notes (Addendum)
 Pt reports he called [facility] last night to tell them what was going on and to call them with questions. PT reports he has not eaten in a few days that his teeth are numb. He states he has had breathing problems for a while but feels like it has gotten worse in the last ten days. Pt says he can't take any medicines that medicines is what is causing all of this. Pt saying he wants palliative care. Pt states that no one ever believes him and that no one wants to help him and that he's tired of no one keeping him to watch him.

## 2024-04-26 ENCOUNTER — Ambulatory Visit: Payer: Self-pay

## 2024-04-26 NOTE — Telephone Encounter (Signed)
 Pt calling stating that he has SOB, pt was seen in the ED on 1/17, d/c home. Pt states I just need palliative or hospice.  Pt was advised to go to ED after he can be heard grunting on the phone during NT. Pt refuses. 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 Pt continues to describe numerous ED and UC visits to which he is discharged. Pt was again advised to go to the ED. Pt refuses. Pt states that I was seen in the ED last week and they tell me to call you.  Pt last visit to PCP clinic was 2/25. Pt states that they don't do anything when I come there so I don't come. Pt unable to confirm if s/s have worsened since 1/17 ED visit. Pt advised to go to the ED, refused. Pt requesting VV for appt, routing to clinic.   Reason for Triage: The patient is calling the community line looking for assistance in discussing their worsening shortness of breath concerns

## 2024-04-27 NOTE — Telephone Encounter (Signed)
 Samuel Thersia Bitters, FNP to Me  (Selected Message)     04/26/24  8:50 PM Patient needs to be seen in office for evaluation. I agree with ED evaluation for concern of shortness of breath. Please have patient schedule appt if agreeable in person with PCP.    Left message requesting patient return call.

## 2024-04-28 ENCOUNTER — Ambulatory Visit (INDEPENDENT_AMBULATORY_CARE_PROVIDER_SITE_OTHER): Admitting: Family Medicine

## 2024-04-28 ENCOUNTER — Ambulatory Visit: Payer: Self-pay

## 2024-04-28 ENCOUNTER — Encounter (HOSPITAL_BASED_OUTPATIENT_CLINIC_OR_DEPARTMENT_OTHER): Payer: Self-pay | Admitting: Family Medicine

## 2024-04-28 VITALS — BP 136/95 | HR 82 | Temp 97.8°F | Resp 22 | Ht 67.0 in | Wt 153.0 lb

## 2024-04-28 DIAGNOSIS — Z91199 Patient's noncompliance with other medical treatment and regimen due to unspecified reason: Secondary | ICD-10-CM | POA: Insufficient documentation

## 2024-04-28 DIAGNOSIS — F32A Depression, unspecified: Secondary | ICD-10-CM | POA: Diagnosis not present

## 2024-04-28 DIAGNOSIS — R4189 Other symptoms and signs involving cognitive functions and awareness: Secondary | ICD-10-CM | POA: Diagnosis not present

## 2024-04-28 DIAGNOSIS — F1210 Cannabis abuse, uncomplicated: Secondary | ICD-10-CM | POA: Insufficient documentation

## 2024-04-28 DIAGNOSIS — F1911 Other psychoactive substance abuse, in remission: Secondary | ICD-10-CM | POA: Diagnosis not present

## 2024-04-28 DIAGNOSIS — R053 Chronic cough: Secondary | ICD-10-CM

## 2024-04-28 DIAGNOSIS — R059 Cough, unspecified: Secondary | ICD-10-CM | POA: Insufficient documentation

## 2024-04-28 DIAGNOSIS — G319 Degenerative disease of nervous system, unspecified: Secondary | ICD-10-CM | POA: Insufficient documentation

## 2024-04-28 DIAGNOSIS — I700 Atherosclerosis of aorta: Secondary | ICD-10-CM | POA: Insufficient documentation

## 2024-04-28 NOTE — Telephone Encounter (Unsigned)
 Copied from CRM [id number]. Topic: General - Transportation >> Apr 27, 2024  4:51 PM [name] wrote: Reason for CRM: Patient called. Has appt in the morning. Needs provider to get in contact with ACT team that works with mental health so they can come out in the morning and help him get to appt. Attempted to contact [facility] - no answer. Thank You

## 2024-04-28 NOTE — Progress Notes (Signed)
 "   Procedures performed today:    None.  Independent interpretation of notes and tests performed by another provider:   None.  Brief History, Exam, Impression, and Recommendations:    BP (!) 159/116 (BP Location: Right Arm, Patient Position: Sitting, Cuff Size: Normal)   Pulse 90   Temp 97.8 F (36.6 C) (Oral)   Resp (!) 22   Ht 5' 7 (1.702 m)   Wt 153 lb (69.4 kg)   SpO2 98%   BMI 23.96 kg/m   Patient is overdue for follow-up.  Patient was last seen by me in November 2024 with recommendation to follow-up in about 6 months.  This is his first follow-up visit with me after that initial appointment. Patient reports that he continues to have problems with feeling short of breath and having trouble breathing at times with intermittent coughing.  He also reports ongoing neuropathy and nerve related issues.  Patient has had numerous emergency department visits primarily related to shortness of breath and trouble breathing, some related to chronic, generalized pain, neuropathy.  ED evaluations have generally been reassuring regarding any acute issues related to shortness of breath.  At last visit with me, we had provided referral to psychiatry as well as neurology.  Part of the reason for neurology evaluation was related to reported cognitive changes in conjunction with other reports of neuropathy.  He notes today that when he received phone call from neurology office to schedule appointment, they did discuss that part of the referral was for assessing cognitive changes and when they mention this, he reports time and that he was not having any cognitive changes or memory impairment.  When reviewing this again today.  He again reiterates that he has been having these changes so is not clear as to why the opposite was communicated when neurology office was attempting to schedule evaluation. In our discussion today, patient notes his need to be placed under hospice care.  He also indicates that he  has not been able to eat or drink for a long time, extending back to our initial appointment.  I did review vitals from our appointment compared to last appointment.  Since his last appointment with me little bit over 1 year ago, he has had measured weight loss of about 2 pounds during this time.  This would somewhat contradict reports related to significant decrease in p.o. intake.  Long discussion again today regarding ongoing symptoms and need for further evaluation.  In this regard, I do feel that he would be best served to have evaluation with neurology, psychiatry, pulmonology.  Patient ultimately in agreement to proceed with referrals.  Discussed importance that when these offices call, to schedule appointment with them for further evaluation.  He previously has been referred to palliative care given his ongoing, long-term symptoms.  However, I do not feel that hospice is the correct setting for care for him.  I think that ultimately he does not need additional evaluation regarding ongoing symptoms and needs specialty care in this regard with referrals as outlined above.  If any acute changes in symptoms are noted, recommend reaching out to the office or presenting to emergency department, particularly if having any worsening shortness of breath or trouble breathing.  Cognitive changes -     Ambulatory referral to Neurology  Depression, unspecified depression type -     Ambulatory referral to Psychiatry  Chronic cough -     Pulmonary Visit  History of substance abuse (HCC) -  Ambulatory referral to Psychiatry  Return in about 6 months (around 10/26/2024).  Spent 37 minutes on this patient encounter, including preparation, chart review, face-to-face counseling with patient and coordination of care, and documentation of encounter   ___________________________________________ Shaylie Eklund de Cuba, MD, ABFM, CAQSM Primary Care and Sports Medicine [facility] "

## 2024-04-28 NOTE — Telephone Encounter (Signed)
 FYI Only or Action Required?: FYI only for provider: Transferred to agent to set up new Pt appointment.  Patient is followed in Pulmonology for N/a, last seen on Not yet established.  Called Nurse Triage reporting Shortness of Breath.  Symptoms began several years ago.  Interventions attempted: Other: Evaluated in ED.  Symptoms are: unchanged.  Triage Disposition: No disposition on file.  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  Discussed labs and imaging.  Likely chronic in nature.  He will need follow-up outpatient. Can hear patient grunting through the phone. Pt reports this has been going on for the last 2 years but gotten worse over the last month or 2. Seen Dr. De Cuba PCP in office today and referrals given for Neurology, Pulmonology and Psychiatry. Pt states Psychiatry can't see him for another month, advised patient to call neuro to set up appointment. Transferred pt to [name], to finish scheduling new patient appointment for pulmonolgoy.  Protocols used: Breathing Difficulty-A-AH  Message from [place] B sent at 04/28/2024 10:11 AM EST  Reason for Triage: Difficulty Breathing

## 2024-04-29 ENCOUNTER — Telehealth (HOSPITAL_BASED_OUTPATIENT_CLINIC_OR_DEPARTMENT_OTHER): Payer: Self-pay

## 2024-04-29 LAB — CULTURE, BLOOD (ROUTINE X 2)
Culture: NO GROWTH
Culture: NO GROWTH
Special Requests: ADEQUATE
Special Requests: ADEQUATE

## 2024-04-29 NOTE — Telephone Encounter (Signed)
 Social worker called to confirm that patient had referrals placed for concerns that he has had addressed during recent office visit.

## 2024-04-29 NOTE — Telephone Encounter (Signed)
 Copied from CRM #[id number]. Topic: General - Other >> Apr 29, 2024  2:08 PM Victoria B wrote: Reason for CRM: [place] from [facility] called, asking to speak with Dr De cuba about patient not cooperating  with her and all over the place  and she is trying to help him, to get to his ongoing appts and getting his referral info. Caller does state that patient signed forms to get his medical records.  The patient also states, Dr De cuba was quiet at his appt and he didn't feel assisted.

## 2024-04-29 NOTE — Telephone Encounter (Signed)
 Copied from CRM #[id number]. Topic: Clinical - Medical Advice >> Apr 29, 2024  1:57 PM Chasity T wrote: Reason for CRM: Shanice a child psychotherapist of [employer] is calling to request a nurse to call her back regarding patient and his symptoms.   Phone number: [phone]

## 2024-05-03 ENCOUNTER — Ambulatory Visit: Payer: Self-pay

## 2024-05-03 NOTE — Telephone Encounter (Signed)
 FYI Only or Action Required?: FYI only for provider: ED refusal.  Patient was last seen in primary care on 04/28/2024 by de Cuba, Quintin PARAS, MD.  Called Nurse Triage reporting Shortness of Breath and Dysphagia.  Symptoms began several years ago.  Interventions attempted: Other: oxygen.  Symptoms are: gradually worsening.  Triage Disposition: Go to ED Now (Notify PCP)  Patient/caregiver understands and will follow disposition?: No, refuses disposition                   Message from Gattis SQUIBB sent at 05/03/2024 12:36 PM EST  Reason for Triage: [name] Health care advocate calling for advise as to what to tell the patient regarding his not eating, can't swallow and breathingis bad.   Reason for Disposition  [1] MODERATE difficulty breathing (e.g., speaks in phrases, SOB even at rest, pulse 100-120) AND [2] NEW-onset or WORSE than normal  Answer Assessment - Initial Assessment Questions Bronwen, RN health advocate with [facility], calling in for triage. Patient is reporting emergent symptoms to her: SOB and has not had food or water in 2 days. She advised him if he has difficulty breathing go to ED and he told her that he is dying and the hospital won't do anything. Asking if he has a swallow study ordered and wanted PCP to know he refused his psych referral. [name] [name] his PCP just ignored it and sat there quiet. RN concerned because he is [name], [name] and telling her diagnoses that he has (Neurotoxicity, paralyzed lung).   Triage RN called patient and spoke with him. He reports: SOB x 2.5 years ago (gradually worsening), not able to eat or drink for months. He thinks his lungs are paralyzed and feels moderately SOB over the past 3 weeks (past few nights feels like gasping for air). RN stopped triage and advised ED. Patient refused due to he states they think he is a drug addict and mentally ill at the ED. He states he will just die. RN advised him to call 911 or call nurse  triage back for worsening symptoms.   Patient wants Hospice.  Protocols used: Breathing Difficulty-A-AH

## 2024-05-03 NOTE — Telephone Encounter (Signed)
 Routing encounter to Dr. De Cuba for further review. Please advise on pt requesting hospice.

## 2024-05-04 ENCOUNTER — Telehealth (HOSPITAL_BASED_OUTPATIENT_CLINIC_OR_DEPARTMENT_OTHER): Payer: Self-pay | Admitting: *Deleted

## 2024-05-04 NOTE — Telephone Encounter (Signed)
 Copied from CRM #[id number]. Topic: General - Other >> May 04, 2024 10:58 AM Travis F wrote: Reason for CRM: Niels Miu a social worker is calling in because she would like to relay a message to patient's provide Dr. De Cuba. She says patient was seen at [facility] for Palliative care and she believes he needs palliative care and she says [facility] is requesting a referral from Dr. De Cuba before they can see him for palliative care.   [facility] Fax: [phone] [facility] Ph: [phone]

## 2024-05-05 ENCOUNTER — Emergency Department (HOSPITAL_COMMUNITY): Admission: EM | Admit: 2024-05-05 | Discharge: 2024-05-05 | Disposition: A | Discharge status: Home / Self Care

## 2024-05-05 ENCOUNTER — Emergency Department (HOSPITAL_COMMUNITY)

## 2024-05-05 ENCOUNTER — Other Ambulatory Visit: Payer: Self-pay

## 2024-05-05 ENCOUNTER — Encounter (HOSPITAL_COMMUNITY): Payer: Self-pay

## 2024-05-05 DIAGNOSIS — J029 Acute pharyngitis, unspecified: Secondary | ICD-10-CM | POA: Diagnosis present

## 2024-05-05 DIAGNOSIS — I10 Essential (primary) hypertension: Secondary | ICD-10-CM | POA: Insufficient documentation

## 2024-05-05 NOTE — ED Notes (Signed)
 Pt transported to Xray via stretcher

## 2024-05-05 NOTE — ED Notes (Signed)
Ambulatory to BR, steady gait.

## 2024-05-05 NOTE — ED Notes (Signed)
Pt resting quietly on stretcher with eyes closed.

## 2024-05-05 NOTE — Discharge Instructions (Addendum)
 Your chest x-ray not show any acute findings today.  You will be contacted by palliative care for further management.

## 2024-05-05 NOTE — ED Provider Notes (Signed)
 " [place] EMERG[facility] DEPARTMENT AT [facility] Provider Note   CSN: [id number] Arrival date & time: 05/05/24  9292     Patient presents with: Sore Throat   [name] is a 69 y.o. male.   69 year old male with a past medical history of  hypertension, paranoia, chronic hepatitis C, hypertension, chronic pain, prior EtOH use presents to the ED with a chief complaint of sore throat that been ongoing for months.  Previously evaluated in the emergency department had multiple imaging performed without any acute findings.  He tells me that his symptoms have progressed over the last couple of days, however he has had this sore throat/feeling like his throat is closing for some years now.  He tells me he had neurotoxicity from medication in the past, he is requesting MEBDT, which I discussed with him we do not do in the emergency department. No chest pain, no respiratory distress, no fever, and no trismus.   The history is provided by the patient and medical records.  Sore Throat This is a chronic problem. The current episode started more than 1 week ago. The problem occurs constantly. The problem has not changed since onset.Pertinent negatives include no chest pain, no abdominal pain and no shortness of breath.       Prior to Admission medications  Medication Sig Start Date End Date Taking? Authorizing Provider  acetaminophen  (TYLENOL ) 325 MG tablet Take 650 mg by mouth every 6 (six) hours as needed. Patient not taking: Reported on 04/28/2024    [provider]  propranolol  (INDERAL ) 20 MG tablet Take 1 tablet (20 mg total) by mouth 2 (two) times daily. Patient not taking: Reported on 04/28/2024 05/21/23   Jadapalle, Sree, MD    Allergies: Diazepam, Morphine  and codeine, and Gabapentin     Review of Systems  Constitutional:  Negative for fever.  HENT:  Positive for sore throat.   Respiratory:  Negative for shortness of breath.   Cardiovascular:  Negative for chest pain.   Gastrointestinal:  Negative for abdominal pain.    Updated Vital Signs BP (!) 136/90 (BP Location: Right Arm)   Pulse 71   Temp 98.9 F (37.2 C) (Oral)   Resp 18   SpO2 100%   Physical Exam Vitals and nursing note reviewed.  Constitutional:      General: He is not in acute distress.    Appearance: He is well-developed. He is not ill-appearing, toxic-appearing or diaphoretic.  HENT:     Head: Normocephalic and atraumatic.     Mouth/Throat:     Mouth: Mucous membranes are moist.     Pharynx: Uvula midline. No oropharyngeal exudate, posterior oropharyngeal erythema or uvula swelling.     Tonsils: No tonsillar exudate or tonsillar abscesses.     Comments: Oropharynx is patent, uvula is midline, no tonsillar exudate or PTA.  Cardiovascular:     Rate and Rhythm: Normal rate.  Pulmonary:     Effort: Pulmonary effort is normal.     Breath sounds: No wheezing, rhonchi or rales.  Abdominal:     Palpations: Abdomen is soft.  Skin:    General: Skin is warm and dry.  Neurological:     Mental Status: He is alert and oriented to person, place, and time.     (all labs ordered are listed, but only abnormal results are displayed) Labs Reviewed - No data to display  EKG: None  Radiology: DG Chest 2 View Result Date: 05/05/2024 EXAM: 2 VIEW(S) XRAY OF THE CHEST  05/05/2024 07:59:00 AM COMPARISON: 01/28/2024 CLINICAL HISTORY: Shortness of breath. FINDINGS: LUNGS AND PLEURA: No focal pulmonary opacity. No pleural effusion. No pneumothorax. HEART AND MEDIASTINUM: No acute abnormality of the cardiac and mediastinal silhouettes. BONES AND SOFT TISSUES: Mild thoracic kyphosis. No acute osseous abnormality. IMPRESSION: 1. No acute process. Electronically signed by: Waddell Calk MD 05/05/2024 08:09 AM EST RP Workstation: [id number]     Procedures   Medications Ordered in the ED - No data to display                                  Medical Decision Making Amount and/or Complexity of  Data Reviewed Radiology: ordered.    Patient presenting to the ED with a chief complaint of sore throat has been ongoing for months.  Extensive review of records with prior visits to the emergency department.  Normal CT head, neck, chest abdomen and pelvis without any acute findings.  According to prior notes from his PCP did show patient requested palliative consult.  He is here today as he tells me that his symptoms have been ongoing, he is not able to follow-up with anyone thus far although it does look like these appointments have been missed according to his records.  His O2 saturations are 100%, he is in no respiratory sign of distress.  He is speaking in full sentences without any trismus.  Oropharynx is clear.  No tachypnea, lungs are clear to auscultation.  Overall a very reassuring exam.  We discussed obtaining a chest x-ray as he does feel like he has something going on with his breathing.  His chest x-ray is cleared.  I offered multiple alternatives of pain medication versus ENT referral, patient is adamant that he needs a smart for EGD testing, I discussed with patient that I am unable to order this test for him in the emergency department.  We are focusing on any emergent workup at this time, he is requesting palliative consult.  Call placed to palliative care who will evaluate patient while in the ED. However, might have along wait.   12:02 PM Patient has been in the ED for ~5 hours, no signs of respiratory distress, is ambulatory with a steady gait.  No trouble tolerating his secretions.  Patient is comfortably resting on stretcher.  We discussed outpatient palliative versus continue to wait in the emergency department. Patient seen by my attending who is also in agreement of outpatient management.    Portions of this note were generated with Scientist, clinical (histocompatibility and immunogenetics). Dictation errors may occur despite best attempts at proofreading.   Final diagnoses:  Sore throat    ED Discharge  Orders     None          Maureen Broad, [place] 05/05/24 1203  "

## 2024-05-05 NOTE — ED Notes (Signed)
 AVS provided to and discussed with patient. Pt verbalizes understanding of discharge instructions and denies any questions or concerns at this time. Pt ambulated out of department independently with steady gait.

## 2024-05-05 NOTE — ED Triage Notes (Signed)
 Pt BIB [facility] from home C/O sore throat X months.   156/98 72 18 98% RA CBG 123

## 2024-05-07 ENCOUNTER — Ambulatory Visit (INDEPENDENT_AMBULATORY_CARE_PROVIDER_SITE_OTHER): Admitting: Pulmonary Disease

## 2024-05-07 ENCOUNTER — Encounter (HOSPITAL_BASED_OUTPATIENT_CLINIC_OR_DEPARTMENT_OTHER): Payer: Self-pay | Admitting: Pulmonary Disease

## 2024-05-07 ENCOUNTER — Telehealth (HOSPITAL_BASED_OUTPATIENT_CLINIC_OR_DEPARTMENT_OTHER): Payer: Self-pay

## 2024-05-07 VITALS — BP 147/96 | HR 98 | Ht 67.0 in | Wt 152.0 lb

## 2024-05-07 DIAGNOSIS — F419 Anxiety disorder, unspecified: Secondary | ICD-10-CM | POA: Diagnosis not present

## 2024-05-07 DIAGNOSIS — R059 Cough, unspecified: Secondary | ICD-10-CM

## 2024-05-07 DIAGNOSIS — F32A Depression, unspecified: Secondary | ICD-10-CM

## 2024-05-07 DIAGNOSIS — R1310 Dysphagia, unspecified: Secondary | ICD-10-CM

## 2024-05-07 DIAGNOSIS — I10 Essential (primary) hypertension: Secondary | ICD-10-CM

## 2024-05-07 DIAGNOSIS — R0602 Shortness of breath: Secondary | ICD-10-CM | POA: Diagnosis not present

## 2024-05-07 DIAGNOSIS — Z87891 Personal history of nicotine dependence: Secondary | ICD-10-CM

## 2024-05-07 NOTE — Telephone Encounter (Signed)
 Shanice spoke with Gloria/Dr Kassie please see encounter from today for details. NFN   Copied from CRM [id number]. Topic: General - Other >> May 07, 2024 10:44 AM Dedra NOVAK wrote: Reason for CRM: Ranell, social worker with [facility], would like a call from nurse regarding patient's appointment. She can be reached at [phone]. >> May 07, 2024 11:33 AM Ismael LABOR wrote: Noreen from DSS called back stating pt in distress/panic - she wants to get more insight as to what transpired at appt., pt expressing concerns about health doesn't feel like he has long to live, pt stated provider didn't listen and nothing was accomplished - I called CAL and warm transferred to Coria at clinic

## 2024-05-07 NOTE — Patient Instructions (Addendum)
 Shortness of breath --ORDER pulmonary function tests  Dysphagia --Planning to see PCP today and requesting swallow evaluation --REFER to ENT

## 2024-05-07 NOTE — Progress Notes (Signed)
 "   Subjective:   PATIENT ID: [name] GENDER: male DOB: May 23, 1955, MRN: [id number]  Chief Complaint  Patient presents with   Establish Care    Reason for Visit: New consult for shortness of breath and cough     [name] is a 69 y.o. male former smoker with HTN, HLD, chronic hepatitis, anxiety, depression, hx polysubstance abuse who presents for evaluation for shortness of breath and cough.  Social History: Former smoker. Quit 2004 32 pack-years  Environmental exposures:      05/07/2024 Discussed the use of AI scribe software for clinical note transcription with the patient, who gave verbal consent to proceed.  History of Present Illness [name] is a 69 year old male who presents with shortness of breath and intermittent cough.  He has been experiencing shortness of breath for two and a half years, with no specific exacerbating or alleviating factors. There is no cyanosis, but fingers and toes appear red. Breathing is accompanied by grunting and occasional wheezing, although he is unable to hear the wheezing due to the grunting. He has not tried albuterol  or any inhalers before.  He describes a sensation of a 'nick' in his throat, which he does not classify as a cough. He experiences a stabbing and burning sensation in his chest, which he associates with neuropathy. No chest tightness, but there is difficulty taking deep breaths, described as 'pins and needles' or burning. The pain and breathing difficulties worsen throughout the day, peaking at night.  He has a history of smoking one to two packs per day from age 41 until he quit around age [age], approximately 30 years ago. He denies current smoking or vaping.  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 He is concerned about paradoxical reactions to medications, citing past experiences with melatonin causing auditory  hallucinations.  He has a history of acid reflux but reports that symptoms resolved after discontinuing medication. He currently struggles with eating, sometimes resorting to baby food for nutrition. No current reflux symptoms and no mucus production with throat clearing.  He has previously been prescribed Protonix  and Flonase  but does not recall the duration of use. He has not received any breathing treatments during emergency department visits for shortness of breath.     Past Medical History:  Diagnosis Date   Alcohol abuse    Allergy    Bipolar disorder (HCC)    Chronic pain    lower back pain, that has progressed   Depression    Diverticulosis    Dyspnea    Hepatitis C    hx. ofstates he no longer has is clear.   History of recreational drug use    cocaine, marijuana, polysubstance quit 1987.   HLD (hyperlipidemia)    Hypertension    Pancreatitis 2015   Panic attack    Sleep apnea    no cpap used,still has machine(use rarely)     Family History  Problem Relation Age of Onset   Heart disease Mother    Kidney disease Mother    COPD Mother    Lupus Mother    Heart disease Father    Breast cancer Maternal Grandmother    Diabetes Maternal Grandfather      Social History   Occupational History   Occupation: DISABLED   Occupation:    Tobacco Use   Smoking status: Former    Current packs/day: 0.00  Average packs/day: 2.0 packs/day for 16.0 years (32.0 ttl pk-yrs)    Types: Cigarettes    Start date: 04/08/1986    Quit date: 04/08/2002    Years since quitting: 22.0    Passive exposure: Past   Smokeless tobacco: Never  Vaping Use   Vaping status: Never Used  Substance and Sexual Activity   Alcohol use: No    Alcohol/week: 0.0 standard drinks of alcohol    Comment: Quit in 1987   Drug use: Yes    Types: Marijuana   Sexual activity: Not Currently    Partners: Female    Allergies[1]   Outpatient Medications Prior to Visit  Medication Sig Dispense  Refill   acetaminophen  (TYLENOL ) 325 MG tablet Take 650 mg by mouth every 6 (six) hours as needed. (Patient not taking: Reported on 04/28/2024)     propranolol  (INDERAL ) 20 MG tablet Take 1 tablet (20 mg total) by mouth 2 (two) times daily. (Patient not taking: Reported on 04/28/2024) 60 tablet 0   No facility-administered medications prior to visit.    Review of Systems  Constitutional:  Negative for chills, diaphoresis, fever, malaise/fatigue and weight loss.  HENT:  Negative for congestion.   Respiratory:  Positive for cough and shortness of breath. Negative for hemoptysis, sputum production and wheezing.   Cardiovascular:  Negative for chest pain, palpitations and leg swelling.     Objective:   Vitals:   05/07/24 0855 05/07/24 0934  BP: (!) 159/94 (!) 147/96  Pulse: 98   SpO2: 96%   Weight: 152 lb (68.9 kg)   Height: 5' 7 (1.702 m)    SpO2: 96 %  Physical Exam GENERAL: Well appearing, no acute distress. HEAD EARS NOSE THROAT: Normocephalic, atraumatic. EYES: Extraocular movements intact, no scleral icterus. RESPIRATORY: Clear to auscultation bilaterally, no crackles, wheezing or rales. Frequent throat clearing that stopped when taking deep breaths CARDIOVASCULAR: Regular rate and rhythm, no murmurs, rubs, or gallops, no jugular venous distention. EXTREMITIES: No edema, no tenderness. Fingers not blue, no clubbing, warm, red, possible Raynaud's phenomenon. NEUROLOGICAL: Alert and oriented x4, cranial nerves II-XII grossly intact. PSYCHIATRIC: Anxious mood, flat affect, tangential speech, repetitive fidgeting of upper and lower extremities   Data Reviewed:  Imaging: CTA 04/24/24 - Visualized lung parenchyma with no pulmonary nodules, masses, infiltrate, effusion or pneumothorax. CXR 05/05/24 - No acute infiltrate effusion or edema  PFT: None on file  Labs: CBC    Component Value Date/Time   WBC 5.5 04/24/2024 1534   RBC 5.64 04/24/2024 1534   HGB 15.3 04/24/2024  1534   HGB 14.5 08/16/2019 1620   HCT 45.3 04/24/2024 1534   HCT 42.9 08/16/2019 1620   PLT 264 04/24/2024 1534   PLT 285 08/16/2019 1620   MCV 80.3 04/24/2024 1534   MCV 78 (L) 08/16/2019 1620   MCH 27.1 04/24/2024 1534   MCHC 33.8 04/24/2024 1534   RDW 13.9 04/24/2024 1534   RDW 14.1 08/16/2019 1620   LYMPHSABS 1.5 04/24/2024 1534   MONOABS 0.5 04/24/2024 1534   EOSABS 0.0 04/24/2024 1534   BASOSABS 0.1 04/24/2024 1534   Absolute eos 04/24/24 - 0     Assessment & Plan:   Discussion: 69 y.o. male former smoker with HTN, HLD, chronic hepatitis, anxiety, depression, hx polysubstance abuse who presents for evaluation for shortness of breath and cough. Patient is poor historian. Difficult to elicit quality/timing of symptoms and triggers. Patient concerned about taking medications due to potential side effects but willing to undergo diagnostic testing. Health  advocate present on telephone and provided additional history as noted above.    Shortness of breath --ORDER pulmonary function tests  >Discussed that if unable to tolerate testing due to his cough, we could likely trial bronchodilators regardless of results. Patient stated he wanted to make an attempt. --Offered bronchodilators prior to testing however patient declined several times stating that he would anticipate several side effects with medications. We discussed that if testing demonstrated need for bronchodilators our options would be limited. He still wished to pursue diagnostic testing despite stating he would not consider treatment.  Dysphagia Frequent throat clearing Unclear etiology however pattern concerning for underlying behavioral diagnosis. Possible GERD --Has tried protonix  in 2021 and flonase  in 2018 --Recommend discussing with PCP for swallow evaluation. Consider psychiatric evaluation for anxiety vs OCD --REFER to ENT --Patient declined retrial of PPI  Addendum: After clinic visit social worker Ranell  from [facility] DSS called. Patient had contacted office stating he doesn't feel like he has long to live and that provider didn't listen and nothing was accomplished. Social worker was updated on the extensive time that myself and my NP student, Meade Leos, spent on clinic visit in obtaining history and explaining plan. Patient's health advocate was also on phone for 24 min of the visit. We addressed questions from patient and advocate prior to leaving room and patient received after visit summary with action plan for his care.   Health Maintenance Immunization History  Administered Date(s) Administered   Fluzone Influenza virus vaccine,trivalent (IIV3), split virus 02/02/2007, 04/05/2008, 02/27/2009, 12/18/2010   H1N1 04/05/2008   Influenza Inj Mdck Quad Pf 12/26/2017   Influenza Split 06/10/2012   Influenza Whole 02/02/2007, 04/05/2008, 02/27/2009, 12/18/2010   Influenza, Seasonal, Injecte, Preservative Fre 01/04/2014, 12/21/2014, 01/17/2017, 01/15/2019   Influenza,inj,Quad PF,6+ Mos 01/04/2014, 12/21/2014, 01/17/2017, 01/15/2019   Influenza-Unspecified 02/06/2013, 01/06/2016, 01/06/2017, 12/26/2017, 01/06/2018   Novel Infuenza-h1n1-09 04/05/2008   PFIZER(Purple Top)SARS-COV-2 Vaccination 09/09/2019   Pneumococcal Polysaccharide-23 08/25/2014   Tdap 10/01/2017   Zoster Recombinant(Shingrix) 10/02/2017, 12/25/2017   Zoster, Unspecified 10/02/2017, 12/25/2017   CT Lung Screen - not qualified. Quit smoking >15 years ago  Orders Placed This Encounter  Procedures   Ambulatory referral to ENT    Referral Priority:   Routine    Referral Type:   Consultation    Referral Reason:   Specialty Services Required    Requested Specialty:   Otolaryngology    Number of Visits Requested:   1   Pulmonary function test    Standing Status:   Future    Expiration Date:   05/07/2025    Where should this test be performed?:   Outpatient Pulmonary    What type of PFT is being ordered?:    Full PFT  No orders of the defined types were placed in this encounter.   Return for after PFT with me or Candis.  I have spent a total time of 50-minutes on the day of the appointment reviewing prior documentation, coordinating care and discussing medical diagnosis and plan with the patient/family. Imaging, labs and tests included in this note have been reviewed and interpreted independently by me. This note is generated using [employer] programming. Patient/family has given consent.  Omid Deardorff Slater Staff, MD [facility] 05/07/2024 9:57 AM        [1]  Allergies Allergen Reactions   Diazepam Other (See Comments)    Ringing in ears, diarrhea, CNS issues-multiple reactions  Neurotoxicity   Morphine  And Codeine Shortness Of Breath  Gabapentin  Other (See Comments)    Gets worse   "

## 2024-06-01 ENCOUNTER — Ambulatory Visit (HOSPITAL_BASED_OUTPATIENT_CLINIC_OR_DEPARTMENT_OTHER)

## 2024-06-01 ENCOUNTER — Encounter (HOSPITAL_BASED_OUTPATIENT_CLINIC_OR_DEPARTMENT_OTHER)

## 2024-07-02 ENCOUNTER — Encounter: Payer: Self-pay | Admitting: Physician Assistant
